# Patient Record
Sex: Female | Born: 1935 | Race: White | Hispanic: No | Smoking: Former smoker
Health system: Southern US, Community
[De-identification: ages and names within clinical notes are randomized; demographics above are authoritative.]

## PROBLEM LIST (undated history)

## (undated) ENCOUNTER — Ambulatory Visit: Payer: MEDICARE | Attending: Hematology & Oncology | Primary: Hematology & Oncology

## (undated) ENCOUNTER — Telehealth

## (undated) ENCOUNTER — Encounter: Attending: Hematology & Oncology | Primary: Hematology & Oncology

## (undated) ENCOUNTER — Ambulatory Visit: Payer: MEDICARE

## (undated) ENCOUNTER — Encounter

## (undated) ENCOUNTER — Telehealth: Attending: Hematology & Oncology | Primary: Hematology & Oncology

## (undated) ENCOUNTER — Ambulatory Visit

## (undated) DIAGNOSIS — J449 Chronic obstructive pulmonary disease, unspecified: Secondary | ICD-10-CM

## (undated) DIAGNOSIS — IMO0002 Reserved for concepts with insufficient information to code with codable children: Secondary | ICD-10-CM

## (undated) DIAGNOSIS — K219 Gastro-esophageal reflux disease without esophagitis: Secondary | ICD-10-CM

## (undated) DIAGNOSIS — R9389 Abnormal findings on diagnostic imaging of other specified body structures: Secondary | ICD-10-CM

## (undated) DIAGNOSIS — H3530 Unspecified macular degeneration: Secondary | ICD-10-CM

## (undated) DIAGNOSIS — Z8489 Family history of other specified conditions: Secondary | ICD-10-CM

## (undated) DIAGNOSIS — E785 Hyperlipidemia, unspecified: Secondary | ICD-10-CM

## (undated) DIAGNOSIS — I509 Heart failure, unspecified: Secondary | ICD-10-CM

## (undated) DIAGNOSIS — I4891 Unspecified atrial fibrillation: Secondary | ICD-10-CM

## (undated) DIAGNOSIS — I10 Essential (primary) hypertension: Secondary | ICD-10-CM

## (undated) DIAGNOSIS — D3A098 Benign carcinoid tumors of other sites: Secondary | ICD-10-CM

## (undated) DIAGNOSIS — IMO0001 Reserved for inherently not codable concepts without codable children: Secondary | ICD-10-CM

## (undated) DIAGNOSIS — Z828 Family history of other disabilities and chronic diseases leading to disablement, not elsewhere classified: Secondary | ICD-10-CM

## (undated) DIAGNOSIS — C801 Malignant (primary) neoplasm, unspecified: Secondary | ICD-10-CM

## (undated) DIAGNOSIS — D751 Secondary polycythemia: Secondary | ICD-10-CM

## (undated) HISTORY — DX: Secondary polycythemia: D75.1

## (undated) HISTORY — PX: SPLENECTOMY: SUR1306

## (undated) HISTORY — PX: COLON RESECTION: SHX5231

## (undated) HISTORY — PX: COLON SURGERY: SHX602

## (undated) HISTORY — DX: Benign carcinoid tumors of other sites: D3A.098

## (undated) HISTORY — DX: Hyperlipidemia, unspecified: E78.5

## (undated) HISTORY — PX: GASTRECTOMY: SHX58

## (undated) HISTORY — DX: Unspecified macular degeneration: H35.30

## (undated) HISTORY — DX: Unspecified atrial fibrillation: I48.91

## (undated) HISTORY — PX: CATARACT EXTRACTION: SUR2

## (undated) HISTORY — DX: Gastro-esophageal reflux disease without esophagitis: K21.9

## (undated) HISTORY — DX: Abnormal findings on diagnostic imaging of other specified body structures: R93.89

## (undated) HISTORY — DX: Essential (primary) hypertension: I10

## (undated) HISTORY — DX: Malignant (primary) neoplasm, unspecified: C80.1

---

## 2003-12-07 ENCOUNTER — Other Ambulatory Visit: Payer: Self-pay

## 2005-03-15 ENCOUNTER — Ambulatory Visit: Payer: Self-pay | Admitting: Internal Medicine

## 2005-06-27 ENCOUNTER — Ambulatory Visit: Payer: Self-pay | Admitting: Internal Medicine

## 2005-07-04 ENCOUNTER — Ambulatory Visit: Payer: Self-pay | Admitting: Internal Medicine

## 2006-06-21 ENCOUNTER — Ambulatory Visit: Payer: Self-pay | Admitting: Internal Medicine

## 2007-09-23 ENCOUNTER — Ambulatory Visit: Payer: Self-pay | Admitting: Internal Medicine

## 2010-03-15 ENCOUNTER — Ambulatory Visit: Payer: Self-pay | Admitting: Internal Medicine

## 2011-08-11 ENCOUNTER — Ambulatory Visit: Payer: Self-pay | Admitting: Oncology

## 2011-08-29 ENCOUNTER — Ambulatory Visit: Payer: Self-pay | Admitting: Oncology

## 2011-09-08 ENCOUNTER — Ambulatory Visit: Payer: Self-pay | Admitting: Oncology

## 2011-10-09 ENCOUNTER — Ambulatory Visit: Payer: Self-pay | Admitting: Oncology

## 2011-10-18 ENCOUNTER — Ambulatory Visit: Payer: Self-pay | Admitting: Internal Medicine

## 2011-10-25 ENCOUNTER — Ambulatory Visit: Payer: Self-pay | Admitting: Oncology

## 2011-10-30 ENCOUNTER — Ambulatory Visit: Payer: Self-pay | Admitting: Internal Medicine

## 2011-11-08 ENCOUNTER — Ambulatory Visit: Payer: Self-pay | Admitting: Oncology

## 2011-12-09 ENCOUNTER — Ambulatory Visit: Payer: Self-pay | Admitting: Oncology

## 2011-12-25 ENCOUNTER — Ambulatory Visit: Payer: Self-pay

## 2012-01-08 ENCOUNTER — Ambulatory Visit: Payer: Self-pay | Admitting: Oncology

## 2012-02-08 ENCOUNTER — Ambulatory Visit: Payer: Self-pay | Admitting: Oncology

## 2012-03-10 ENCOUNTER — Ambulatory Visit: Payer: Self-pay | Admitting: Oncology

## 2012-04-09 ENCOUNTER — Ambulatory Visit: Payer: Self-pay | Admitting: Oncology

## 2012-04-09 ENCOUNTER — Ambulatory Visit: Payer: Self-pay

## 2012-05-10 ENCOUNTER — Ambulatory Visit: Payer: Self-pay | Admitting: Oncology

## 2012-05-10 ENCOUNTER — Ambulatory Visit: Payer: Self-pay

## 2012-06-09 ENCOUNTER — Ambulatory Visit: Payer: Self-pay | Admitting: Oncology

## 2012-07-10 ENCOUNTER — Ambulatory Visit: Payer: Self-pay | Admitting: Oncology

## 2012-08-10 ENCOUNTER — Ambulatory Visit: Payer: Self-pay | Admitting: Oncology

## 2012-09-07 ENCOUNTER — Ambulatory Visit: Payer: Self-pay | Admitting: Oncology

## 2012-10-08 ENCOUNTER — Ambulatory Visit: Payer: Self-pay | Admitting: Oncology

## 2012-10-18 ENCOUNTER — Ambulatory Visit: Payer: Self-pay | Admitting: Internal Medicine

## 2012-11-07 ENCOUNTER — Ambulatory Visit: Payer: Self-pay | Admitting: Oncology

## 2012-12-08 ENCOUNTER — Ambulatory Visit: Payer: Self-pay | Admitting: Oncology

## 2013-01-07 ENCOUNTER — Ambulatory Visit: Payer: Self-pay | Admitting: Oncology

## 2013-02-07 ENCOUNTER — Ambulatory Visit: Payer: Self-pay | Admitting: Oncology

## 2013-03-10 ENCOUNTER — Ambulatory Visit: Payer: Self-pay | Admitting: Oncology

## 2013-04-09 ENCOUNTER — Ambulatory Visit: Payer: Self-pay | Admitting: Oncology

## 2013-05-10 ENCOUNTER — Ambulatory Visit: Payer: Self-pay | Admitting: Oncology

## 2013-06-09 ENCOUNTER — Ambulatory Visit: Payer: Self-pay | Admitting: Oncology

## 2013-06-15 ENCOUNTER — Ambulatory Visit: Payer: Self-pay | Admitting: Physician Assistant

## 2013-06-20 ENCOUNTER — Ambulatory Visit: Payer: Self-pay | Admitting: Physician Assistant

## 2013-06-20 LAB — COMPREHENSIVE METABOLIC PANEL
Albumin: 3.4 g/dL (ref 3.4–5.0)
Alkaline Phosphatase: 106 U/L
Anion Gap: 8 (ref 7–16)
BUN: 13 mg/dL (ref 7–18)
Bilirubin,Total: 0.3 mg/dL (ref 0.2–1.0)
Calcium, Total: 9.6 mg/dL (ref 8.5–10.1)
Chloride: 101 mmol/L (ref 98–107)
EGFR (African American): >60
EGFR (Non-African Amer.): >60
Potassium: 3.1 mmol/L — ABNORMAL LOW (ref 3.5–5.1)
SGOT(AST): 29 U/L (ref 15–37)
SGPT (ALT): 27 U/L (ref 12–78)
Sodium: 140 mmol/L (ref 136–145)
Total Protein: 7.2 g/dL (ref 6.4–8.2)

## 2013-06-20 LAB — URINALYSIS, COMPLETE
Bilirubin,UR: NEGATIVE
Glucose,UR: NEGATIVE mg/dL (ref 0–75)
Ketone: NEGATIVE
Ph: 6.5 (ref 4.5–8.0)
Specific Gravity: 1.01 (ref 1.003–1.030)

## 2013-06-20 LAB — CBC WITH DIFFERENTIAL/PLATELET
Eosinophil %: 0.1 %
Lymphocyte %: 25.7 %
MCH: 27.7 pg (ref 26.0–34.0)
MCHC: 33.6 g/dL (ref 32.0–36.0)
Neutrophil #: 7 10*3/uL — ABNORMAL HIGH (ref 1.4–6.5)
Neutrophil %: 65.1 %
WBC: 10.8 10*3/uL (ref 3.6–11.0)

## 2013-06-22 LAB — URINE CULTURE

## 2013-07-10 ENCOUNTER — Ambulatory Visit: Payer: Self-pay

## 2013-07-10 ENCOUNTER — Ambulatory Visit: Payer: Self-pay | Admitting: Oncology

## 2013-08-10 ENCOUNTER — Ambulatory Visit: Payer: Self-pay | Admitting: Oncology

## 2013-08-17 ENCOUNTER — Ambulatory Visit: Payer: Self-pay | Admitting: Physician Assistant

## 2013-09-10 ENCOUNTER — Ambulatory Visit: Payer: Self-pay | Admitting: Oncology

## 2013-10-08 ENCOUNTER — Ambulatory Visit: Payer: Self-pay | Admitting: Oncology

## 2013-10-30 ENCOUNTER — Ambulatory Visit: Payer: Self-pay | Admitting: Internal Medicine

## 2013-11-07 ENCOUNTER — Inpatient Hospital Stay: Payer: Self-pay | Admitting: Internal Medicine

## 2013-11-07 ENCOUNTER — Ambulatory Visit: Payer: Self-pay | Admitting: Oncology

## 2013-11-07 ENCOUNTER — Ambulatory Visit: Payer: Self-pay | Admitting: Family Medicine

## 2013-11-07 LAB — COMPREHENSIVE METABOLIC PANEL
ALK PHOS: 116 U/L
ALT: 37 U/L (ref 12–78)
Albumin: 2.9 g/dL — ABNORMAL LOW (ref 3.4–5.0)
Anion Gap: 7 (ref 7–16)
BUN: 21 mg/dL — AB (ref 7–18)
Bilirubin,Total: 0.3 mg/dL (ref 0.2–1.0)
CHLORIDE: 110 mmol/L — AB (ref 98–107)
CO2: 27 mmol/L (ref 21–32)
CREATININE: 0.74 mg/dL (ref 0.60–1.30)
Calcium, Total: 8.3 mg/dL — ABNORMAL LOW (ref 8.5–10.1)
EGFR (African American): >60
EGFR (Non-African Amer.): >60
GLUCOSE: 96 mg/dL (ref 65–99)
Osmolality: 290 (ref 275–301)
Potassium: 3.8 mmol/L (ref 3.5–5.1)
SGOT(AST): 50 U/L — ABNORMAL HIGH (ref 15–37)
SODIUM: 144 mmol/L (ref 136–145)
Total Protein: 6.5 g/dL (ref 6.4–8.2)

## 2013-11-07 LAB — CBC
HCT: 43.6 % (ref 35.0–47.0)
HGB: 14.1 g/dL (ref 12.0–16.0)
MCH: 27 pg (ref 26.0–34.0)
MCHC: 32.3 g/dL (ref 32.0–36.0)
MCV: 84 fL (ref 80–100)
Platelet: 251 10*3/uL (ref 150–440)
RBC: 5.22 10*6/uL — AB (ref 3.80–5.20)
RDW: 14.1 % (ref 11.5–14.5)
WBC: 14 10*3/uL — ABNORMAL HIGH (ref 3.6–11.0)

## 2013-11-07 LAB — CK-MB
CK-MB: 2.8 ng/mL (ref 0.5–3.6)
CK-MB: 4.1 ng/mL — ABNORMAL HIGH (ref 0.5–3.6)

## 2013-11-07 LAB — APTT: Activated PTT: 26.9 secs (ref 23.6–35.9)

## 2013-11-07 LAB — TROPONIN I
TROPONIN-I: 0.48 ng/mL — AB
Troponin-I: 0.18 ng/mL — ABNORMAL HIGH

## 2013-11-07 LAB — PRO B NATRIURETIC PEPTIDE: B-TYPE NATIURETIC PEPTID: 18258 pg/mL — AB (ref 0–450)

## 2013-11-07 LAB — CK: CK, Total: 66 U/L

## 2013-11-08 LAB — COMPREHENSIVE METABOLIC PANEL
ALK PHOS: 111 U/L
ANION GAP: 7 (ref 7–16)
Albumin: 2.9 g/dL — ABNORMAL LOW (ref 3.4–5.0)
BUN: 24 mg/dL — ABNORMAL HIGH (ref 7–18)
Bilirubin,Total: 0.5 mg/dL (ref 0.2–1.0)
CALCIUM: 8.4 mg/dL — AB (ref 8.5–10.1)
Chloride: 108 mmol/L — ABNORMAL HIGH (ref 98–107)
Co2: 31 mmol/L (ref 21–32)
Creatinine: 0.78 mg/dL (ref 0.60–1.30)
EGFR (African American): >60
EGFR (Non-African Amer.): >60
GLUCOSE: 101 mg/dL — AB (ref 65–99)
Osmolality: 295 (ref 275–301)
Potassium: 3.9 mmol/L (ref 3.5–5.1)
SGOT(AST): 39 U/L — ABNORMAL HIGH (ref 15–37)
SGPT (ALT): 32 U/L (ref 12–78)
SODIUM: 146 mmol/L — AB (ref 136–145)
Total Protein: 6 g/dL — ABNORMAL LOW (ref 6.4–8.2)

## 2013-11-08 LAB — APTT
Activated PTT: 64.2 secs — ABNORMAL HIGH (ref 23.6–35.9)
Activated PTT: 71.2 secs — ABNORMAL HIGH (ref 23.6–35.9)

## 2013-11-08 LAB — LIPID PANEL
Cholesterol: 136 mg/dL (ref 0–200)
HDL Cholesterol: 51 mg/dL (ref 40–60)
LDL CHOLESTEROL, CALC: 68 mg/dL (ref 0–100)
Triglycerides: 84 mg/dL (ref 0–200)
VLDL Cholesterol, Calc: 17 mg/dL (ref 5–40)

## 2013-11-08 LAB — HEMOGLOBIN A1C: Hemoglobin A1C: 6 % (ref 4.2–6.3)

## 2013-11-08 LAB — CK
CK, TOTAL: 61 U/L
CK, Total: 49 U/L

## 2013-11-08 LAB — PRO B NATRIURETIC PEPTIDE: B-Type Natriuretic Peptide: 14324 pg/mL — ABNORMAL HIGH (ref 0–450)

## 2013-11-08 LAB — TSH: THYROID STIMULATING HORM: 1.4 u[IU]/mL

## 2013-11-08 LAB — TROPONIN I: TROPONIN-I: 0.54 ng/mL — AB

## 2013-11-09 LAB — SEDIMENTATION RATE: Erythrocyte Sed Rate: 6 mm/hr (ref 0–30)

## 2013-11-10 LAB — CBC WITH DIFFERENTIAL/PLATELET
Basophil #: 0 10*3/uL (ref 0.0–0.1)
Basophil %: 0.1 %
EOS PCT: 0 %
Eosinophil #: 0 10*3/uL (ref 0.0–0.7)
HCT: 38.3 % (ref 35.0–47.0)
HGB: 12 g/dL (ref 12.0–16.0)
Lymphocyte #: 1.4 10*3/uL (ref 1.0–3.6)
Lymphocyte %: 5.6 %
MCH: 26.2 pg (ref 26.0–34.0)
MCHC: 31.5 g/dL — AB (ref 32.0–36.0)
MCV: 83 fL (ref 80–100)
MONO ABS: 0.6 x10 3/mm (ref 0.2–0.9)
Monocyte %: 2.5 %
Neutrophil #: 22.4 10*3/uL — ABNORMAL HIGH (ref 1.4–6.5)
Neutrophil %: 91.8 %
PLATELETS: 245 10*3/uL (ref 150–440)
RBC: 4.6 10*6/uL (ref 3.80–5.20)
RDW: 14.2 % (ref 11.5–14.5)
WBC: 24.4 10*3/uL — AB (ref 3.6–11.0)

## 2013-11-10 LAB — TSH: Thyroid Stimulating Horm: 0.297 u[IU]/mL — ABNORMAL LOW

## 2013-11-11 LAB — BASIC METABOLIC PANEL
Anion Gap: 2 — ABNORMAL LOW (ref 7–16)
BUN: 38 mg/dL — AB (ref 7–18)
Calcium, Total: 8.6 mg/dL (ref 8.5–10.1)
Chloride: 107 mmol/L (ref 98–107)
Co2: 32 mmol/L (ref 21–32)
Creatinine: 1.12 mg/dL (ref 0.60–1.30)
EGFR (African American): 56 — ABNORMAL LOW
GFR CALC NON AF AMER: 48 — AB
Glucose: 97 mg/dL (ref 65–99)
Osmolality: 290 (ref 275–301)
Potassium: 3.8 mmol/L (ref 3.5–5.1)
SODIUM: 141 mmol/L (ref 136–145)

## 2013-11-11 LAB — CBC WITH DIFFERENTIAL/PLATELET
BASOS ABS: 0 10*3/uL (ref 0.0–0.1)
Basophil %: 0.1 %
Eosinophil #: 0 10*3/uL (ref 0.0–0.7)
Eosinophil %: 0.1 %
HCT: 40.5 % (ref 35.0–47.0)
HGB: 13.1 g/dL (ref 12.0–16.0)
LYMPHS PCT: 9.8 %
Lymphocyte #: 2.1 10*3/uL (ref 1.0–3.6)
MCH: 26.9 pg (ref 26.0–34.0)
MCHC: 32.3 g/dL (ref 32.0–36.0)
MCV: 83 fL (ref 80–100)
Monocyte #: 1.7 x10 3/mm — ABNORMAL HIGH (ref 0.2–0.9)
Monocyte %: 8 %
NEUTROS ABS: 18 10*3/uL — AB (ref 1.4–6.5)
Neutrophil %: 82 %
Platelet: 226 10*3/uL (ref 150–440)
RBC: 4.87 10*6/uL (ref 3.80–5.20)
RDW: 14 % (ref 11.5–14.5)
WBC: 21.9 10*3/uL — AB (ref 3.6–11.0)

## 2013-11-12 LAB — BASIC METABOLIC PANEL
Anion Gap: 3 — ABNORMAL LOW (ref 7–16)
BUN: 29 mg/dL — AB (ref 7–18)
CO2: 33 mmol/L — AB (ref 21–32)
Calcium, Total: 8.6 mg/dL (ref 8.5–10.1)
Chloride: 106 mmol/L (ref 98–107)
Creatinine: 1.11 mg/dL (ref 0.60–1.30)
EGFR (African American): 56 — ABNORMAL LOW
EGFR (Non-African Amer.): 49 — ABNORMAL LOW
GLUCOSE: 107 mg/dL — AB (ref 65–99)
OSMOLALITY: 289 (ref 275–301)
Potassium: 3.9 mmol/L (ref 3.5–5.1)
Sodium: 142 mmol/L (ref 136–145)

## 2013-11-12 LAB — CULTURE, BLOOD (SINGLE)

## 2013-11-12 LAB — WBC: WBC: 15.2 10*3/uL — AB (ref 3.6–11.0)

## 2013-11-18 ENCOUNTER — Ambulatory Visit: Payer: Self-pay | Admitting: Family

## 2013-12-03 ENCOUNTER — Ambulatory Visit: Payer: Self-pay | Admitting: Oncology

## 2013-12-08 ENCOUNTER — Ambulatory Visit: Payer: Self-pay | Admitting: Oncology

## 2013-12-24 ENCOUNTER — Ambulatory Visit: Payer: Self-pay | Admitting: Family

## 2014-01-07 ENCOUNTER — Ambulatory Visit: Payer: Self-pay | Admitting: Oncology

## 2014-01-26 ENCOUNTER — Ambulatory Visit: Payer: Self-pay | Admitting: Family

## 2014-02-07 ENCOUNTER — Ambulatory Visit: Payer: Self-pay | Admitting: Oncology

## 2014-03-10 ENCOUNTER — Ambulatory Visit: Payer: Self-pay | Admitting: Oncology

## 2014-04-09 ENCOUNTER — Ambulatory Visit: Payer: Self-pay | Admitting: Oncology

## 2014-05-10 ENCOUNTER — Ambulatory Visit: Payer: Self-pay | Admitting: Oncology

## 2014-06-03 ENCOUNTER — Ambulatory Visit: Payer: Self-pay | Admitting: Oncology

## 2014-06-08 ENCOUNTER — Ambulatory Visit: Payer: Self-pay | Admitting: Family

## 2014-06-09 ENCOUNTER — Ambulatory Visit: Payer: Self-pay | Admitting: Oncology

## 2014-06-12 ENCOUNTER — Emergency Department: Payer: Self-pay | Admitting: Emergency Medicine

## 2014-06-12 LAB — URINALYSIS, COMPLETE
BACTERIA: NONE SEEN
BILIRUBIN, UR: NEGATIVE
Blood: NEGATIVE
Glucose,UR: NEGATIVE mg/dL (ref 0–75)
KETONE: NEGATIVE
LEUKOCYTE ESTERASE: NEGATIVE
Nitrite: NEGATIVE
PH: 6 (ref 4.5–8.0)
Protein: NEGATIVE
RBC,UR: 3 /HPF (ref 0–5)
Specific Gravity: 1.013 (ref 1.003–1.030)
Squamous Epithelial: <1
WBC UR: NONE SEEN /HPF (ref 0–5)

## 2014-06-12 LAB — BASIC METABOLIC PANEL
Anion Gap: 5 — ABNORMAL LOW (ref 7–16)
BUN: 15 mg/dL (ref 7–18)
CHLORIDE: 109 mmol/L — AB (ref 98–107)
Calcium, Total: 9.1 mg/dL (ref 8.5–10.1)
Co2: 28 mmol/L (ref 21–32)
Creatinine: 0.86 mg/dL (ref 0.60–1.30)
EGFR (Non-African Amer.): >60
Glucose: 90 mg/dL (ref 65–99)
Osmolality: 283 (ref 275–301)
Potassium: 4.1 mmol/L (ref 3.5–5.1)
SODIUM: 142 mmol/L (ref 136–145)

## 2014-06-12 LAB — CBC
HCT: 49.2 % — ABNORMAL HIGH (ref 35.0–47.0)
HGB: 15.9 g/dL (ref 12.0–16.0)
MCH: 28.4 pg (ref 26.0–34.0)
MCHC: 32.4 g/dL (ref 32.0–36.0)
MCV: 88 fL (ref 80–100)
Platelet: 208 10*3/uL (ref 150–440)
RBC: 5.61 10*6/uL — ABNORMAL HIGH (ref 3.80–5.20)
RDW: 14.5 % (ref 11.5–14.5)
WBC: 8.5 10*3/uL (ref 3.6–11.0)

## 2014-06-12 LAB — HEPATIC FUNCTION PANEL A (ARMC)
ALBUMIN: 3.6 g/dL (ref 3.4–5.0)
Alkaline Phosphatase: 101 U/L
Bilirubin,Total: 0.4 mg/dL (ref 0.2–1.0)
SGOT(AST): 32 U/L (ref 15–37)
SGPT (ALT): 23 U/L
TOTAL PROTEIN: 7.5 g/dL (ref 6.4–8.2)

## 2014-06-12 LAB — TROPONIN I: Troponin-I: <0.02 ng/mL

## 2014-06-12 LAB — LIPASE, BLOOD: Lipase: 147 U/L (ref 73–393)

## 2014-06-13 ENCOUNTER — Ambulatory Visit: Payer: Self-pay | Admitting: Oncology

## 2014-06-26 ENCOUNTER — Ambulatory Visit: Payer: Self-pay | Admitting: Family

## 2014-07-10 ENCOUNTER — Ambulatory Visit: Payer: Self-pay | Admitting: Oncology

## 2014-07-29 ENCOUNTER — Ambulatory Visit: Payer: Self-pay | Admitting: Ophthalmology

## 2014-08-26 ENCOUNTER — Ambulatory Visit: Payer: Self-pay | Admitting: Oncology

## 2014-09-01 ENCOUNTER — Ambulatory Visit: Payer: Self-pay | Admitting: Oncology

## 2014-09-08 ENCOUNTER — Ambulatory Visit
Admit: 2014-09-08 | Disposition: A | Discharge status: Home / Self Care | Payer: Self-pay | Attending: Oncology | Admitting: Oncology

## 2014-10-09 ENCOUNTER — Ambulatory Visit
Admit: 2014-10-09 | Disposition: A | Discharge status: Home / Self Care | Payer: Self-pay | Attending: Oncology | Admitting: Oncology

## 2014-10-31 NOTE — Consult Note (Signed)
PATIENT NAME:  Veronica Wang, Veronica Wang MR#:  [id number] DATE OF BIRTH:  1936-04-22  CARDIOLOGY CONSULTATION   DATE OF CONSULTATION:  11/08/2013  CONSULTING PHYSICIAN:  Isaias Cowman, MD  PRIMARY CARE PHYSICIAN: Adin Hector, MD   CHIEF COMPLAINT: Shortness of breath.   REASON FOR CONSULTATION: Consultation requested for evaluation of elevated troponin.   HISTORY OF PRESENT ILLNESS: The patient is a 79 year old female with history of carcinoid. The patient has been in her usual state of health until 2 weeks ago, when she has been experiencing increasing exertional dyspnea. The patient was seen as an outpatient and treated with prednisone pack and antibiotics, with presumptive diagnosis of bronchitis. The patient continued to experience shortness of breath and presented to [facility], where she was tachycardic, tachypneic and hypoxic, with an O2 saturation of 86%. EKG revealed atrial fibrillation with rapid ventricular rate which was brief in nature and converted to sinus rhythm. The patient had borderline elevated troponin of 0.18. EKG was nondiagnostic. The patient was admitted to telemetry. Followup troponin was 0.48. Total CPK, MB were 66 and 4.1, respectively. The patient denies chest pain. The patient does note that she has had some mild pedal edema during the past 2 weeks.   PAST MEDICAL HISTORY:  1. Carcinoid.  2. History of tobacco abuse.   MEDICATIONS:  1. Prednisone taper.  2. Cefuroxime 250 mg b.i.d. 3. Gabapentin 1200 mg t.i.d. 4. Multivitamin 1 daily.   SOCIAL HISTORY: The patient currently lives alone. She occasionally smokes.   FAMILY HISTORY: No immediate family history of myocardial infarction.   REVIEW OF SYSTEMS:  CONSTITUTIONAL: The patient denies fever or chills.  EYES: No blurry vision.  EARS: No hearing loss.  RESPIRATORY: The patient has had progressive exertional dyspnea.  CARDIOVASCULAR: The patient denies chest pain.  GASTROINTESTINAL: The patient  has diarrhea related to her carcinoid.  GENITOURINARY: No dysuria or hematuria.  ENDOCRINE: No polyuria or polydipsia.  MUSCULOSKELETAL: No arthralgias or myalgias.  NEUROLOGICAL: No focal muscle weakness or numbness.  PSYCHOLOGICAL: No depression or anxiety.   PHYSICAL EXAMINATION:  VITAL SIGNS: Blood pressure 115/75, pulse 72, respirations 16, temperature 97.6, pulse oximetry 97%.  HEENT: Pupils equally reactive to light and accommodation.  NECK: Supple without thyromegaly.  LUNGS: Reveal decreased breath sounds in both bases.  CARDIOVASCULAR: Normal JVP. Normal PMI. Regular rate and rhythm. No appreciable gallop, murmur or rub.  ABDOMEN: Soft and nontender. Pulses were intact bilaterally.  MUSCULOSKELETAL: Normal muscle tone.  NEUROLOGICAL: The patient is alert and oriented x3. Motor and sensory both grossly intact.   IMPRESSION: A 79 year old female who presents with a 2-week history of exertional dyspnea, mild pedal edema, occasional productive cough, consistent with picture of bronchitis, with a history of carcinoid. The patient has borderline elevated troponin with completely normal CPK-MB, nondiagnostic EKG in the absence of chest pain. I suspect the borderline elevated troponin is due to demand-supply ischemia and not due to acute coronary syndrome. The patient did have a brief episode of atrial fibrillation, currently in sinus rhythm.   RECOMMENDATIONS:  1. Agree with overall current therapy.  2. Would discontinue heparin drip. At this time, risks appear to outweigh advantages.  3. Review 2-D echocardiogram.  4. Further recommendations based on echocardiogram results.   ____________________________ Isaias Cowman, MD ap:lb D: 11/08/2013 09:42:17 ET T: 11/08/2013 10:06:36 ET JOB#: [id number]  cc: Isaias Cowman, MD, <Dictator> Isaias Cowman MD ELECTRONICALLY SIGNED 12/08/2013 15:02

## 2014-10-31 NOTE — H&P (Signed)
PATIENT NAME:  Veronica Wang, Veronica Wang MR#:  [id number] DATE OF BIRTH:  [birth year]  DATE OF ADMISSION:  11/07/2013  PRIMARY CARE PHYSICIAN: Dr. Ramonita Lab at [facility].   REFERRING PHYSICIAN: Dr. Corky Downs.   CHIEF COMPLAINT: Shortness of breath.   HISTORY OF PRESENT ILLNESS: The patient is a pleasant 79 year old thin female who is here for above chief complaint. Of note, the patient has been having shortness of breath and fast heart rate for a couple of weeks, went to doctor at [facility] who started her on cefuroxime. Of note, there is also a CT angio of the chest for PE protocol which was ordered around the same time, which shows no PE but advanced interstitial pulmonary fibrosis. The patient has been not improving significantly and had gone to an urgent care center after shortness of breath worsened yesterday. While at urgent care center, which we have access to, she appeared to be significantly tachycardic, tachypneic and hypoxic with O2 sat of 86% on room air and she is not on oxygen at baseline. Furthermore, EKGs had shown afib with RVR which is newly diagnosed with some ST changes and the patient was referred here. However, the patient was noted to have converted to sinus rhythm while there and the heart rate went to 85 with blood pressure of 113/77, and the patient became more comfortable. She was referred here.  And here, on EKG she appears to be in sinus but there are some T-wave inversions and inferior lateral leads and some ST changes, namely depression in V5 mostly. She had experienced some chest pains with pressure-like feeling in all of her chest and going to her arms today. She is also noted to have a positive troponin and hospitalist services were contacted for further evaluation and management.   PAST MEDICAL HISTORY: History of carcinoid tumor who is being taken care of at [facility] and has had carcinoid surgery in the past, history of shingles and postherpetic neuralgia, history of  radiation therapy, history of long tobacco abuse, now off and on, indigestion, diarrhea with her Sandostatin injections q. monthly for her carcinoid.   ALLERGIES: No known drug allergies.   OUTPATIENT MEDICATIONS: She just finished a prednisone taper, cefuroxime 250 mg 2 times a day, gabapentin 600 mg 2 tabs 3 times a day, multivitamin 1 tab daily and Sandostatin injection monthly.   SOCIAL HISTORY: A long-time smoker had quit, but now smokes off and on if she can but does not smoke much. No alcohol. No drugs.   FAMILY HISTORY: Heart and stroke runs in the family.  REVIEW OF SYSTEMS:  CONSTITUTIONAL: Denies any significant weight changes. No fevers or chills, but some weakness.  EYES: Macular degeneration and has injection in her eyes. EARS, NOSE, THROAT: No tinnitus but does have some dysphagia where she feels like sometimes she cannot swallow.  RESPIRATORY: Positive for shortness of breath, dyspnea on exertion. No painful respirations.  CARDIOVASCULAR: Chest pain as above and has had some orthopnea recently, as well as some swelling in the legs, which is better today. Palpitations off and on for a couple of weeks.  GASTROINTESTINAL: History of carcinoid abdominal tumor but no abdominal pain, nausea, vomiting, diarrhea, bloody or dark stools.  GENITOURINARY: Denies dysuria, hematuria. HEMATOLOGIC AND LYMPHATIC: Denies anemia or easy bruising.  SKIN: No new rashes.  MUSCULOSKELETAL: Has some chronic arthritis.  NEUROLOGIC: No focal weakness or numbness.  PSYCHIATRIC: Denies depression or insomnia.   PHYSICAL EXAMINATION: VITAL SIGNS: Temperature here was 98.3, pulse  rate 115, respiratory 24, blood pressure 125/84, O2 sat 94% on oxygen here.  GENERAL: The patient is a very thin female sitting in bed in mild respiratory distress, talking in full sentences.  HEENT: Normocephalic, atraumatic. Pupils are equal and reactive. Temporal wasting.  NECK: Supple. No thyroid tenderness. No cervical  lymphadenopathy. No particular JVD.  CARDIOVASCULAR: S1, S2 regular. No significant murmurs.  LUNGS: Minimum basilar crackles.  Good air entry otherwise.  ABDOMEN: Soft, nontender, nondistended. Positive bowel sounds in all quadrants.  EXTREMITIES: Minimum pitting edema in the feet.  NEUROLOGIC: Cranial nerves II through XII grossly intact. Strength is 5/5 in all extremities. Sensation intact to light touch.  PSYCHIATRIC: Awake, alert and oriented x 3.  SKIN: No obvious rashes or lesions.  LABORATORIES AND IMAGING:  White count of 14, hemoglobin 14.1, platelets are 251. Troponin 0.18, albumin 2.9, bilirubin 0.3, AST 50, ALT 37. BNP is 18,258. BUN 21, creatinine 0.74. Sodium 144, potassium 3.8.  X-ray here shows sinus rhythm with some PACs, inferolateral T-waves and some ST depressions in V5. No acute ST elevations. Prolonged QT.  X-ray of the chest:  Emphysema with small bilateral effusions.   ASSESSMENT AND PLAN: We have a 79 year old female, long-time smoker, history of carcinoid, who has been recently been taken care of as an outpatient with cefuroxime and steroids for acute bronchitis, who also appears to have severe underlying pulmonary fibrosis based on the CAT scan done recently, who comes in for progressive shortness of breath in the last day or 2 with palpitations and noted to be in atrial fibrillation with rapid ventricular response with acute respiratory failure at urgent care. At this point, she is on oxygen. However, she is back in sinus.   In regards to the shortness of breath, I suspect this is triggered by paroxysmal atrial fibrillation with rapid ventricular response with underlying severe pulmonary fibrosis and probable acute congestive heart failure, unknown type. She has no history of congestive heart failure, but she had elevated BNP and some pitting edema, as well as x-ray suggesting some effusions. At this point, she is in sinus and rate controlled. Given the chest pain  issues, I also suspect she might have had non-ST elevation myocardial infarction with a positive troponin.   In regards to her shortness of breath, we would continue the oxygen.  Start her on some Lasix, nitropaste and morphine as needed. Start her on aspirin, a beta blocker to help maintain sinus rhythm and heparin drip. Would consult cardiology, obtain an echocardiogram to assess for left ventricle function and monitor her ins and outs. I would admit her to telemetry.  In regards to the positive troponin, I suspect it is more likely non-ST elevation myocardial infarction given the EKG changes with chest complaints. We will see what the troponins are like and follow with cardiology. She would be started on aspirin, statin, beta blocker, nitroglycerin patch and morphine as needed.  She has no significant chest pain now. I suspect that her progressive lung disease is also contributing, to what extent is not clear.  But I would obtain a pulmonary consult for further evaluation, and if this is advanced pulmonary fibrosis, the overall prognosis is not very good. We would continue her gabapentin and follow her clinically. The patient does appear to have systemic inflammatory response syndrome criteria on admission with leukocytosis and tachycardia and tachypnea. However, the leukocytosis, I suspect is secondary to prednisone. She has no significant fevers here or no productive cough to suggest she is having  persistent bronchitis. And I suspect her shortness of breath is more likely congestive heart failure and atrial fibrillation on top of pulmonary fibrosis related.  The patient is a DNR per her wishes.   ____________________________ Vivien Presto, MD sa:ce D: 11/07/2013 19:20:37 ET T: 11/07/2013 19:59:27 ET JOB#: [id number]  cc: Vivien Presto, MD, <Dictator> Adin Hector, MD Vivien Presto MD ELECTRONICALLY SIGNED 12/18/2013 10:45

## 2014-10-31 NOTE — Consult Note (Signed)
History of Present Illness:  Reason for Consult metastatic carcinoid, now with acute CHF to   HPI   Patient seen in clinic in March of 2015. Veronica Wang receives monthly octreotide injections in followup every 6 months. All of her imaging at this point has been completed a [facility], but Veronica Wang has decided to completely switch her entire care to [place]. Currently, Veronica Wang still feels short of breath but significantly improved from admission. Veronica Wang otherwise feels well. Veronica Wang has no neurologic complaints.  Veronica Wang denies any recent fevers or illnesses.  Veronica Wang has a good appetite and her weight has remained stable.  He denies any chest pain or shortness of breath.  Veronica Wang has no bone pain and denies any nausea, vomiting, or constipation.  Veronica Wang has no urinary complaints.  Patient offers no further specific complaints today.  PFSH:  Additional Past Medical and Surgical History Metastatic carcinoid. Partial colon resection.  Social history: Positive tobacco, greater than 50 pack years.  Patient continues to smoke.  Patient was offered smoking cessation counseling.  Denies alcohol.  Family history: Negative and noncontributory.   Review of Systems:  Performance Status (ECOG) 1   Review of Systems   As per HPI. Otherwise, 10 point system review was negative.   NURSING NOTES: **Vital Signs.:   04-May-15 11:49   Vital Signs Type: Routine   Temperature Temperature (F): 97.6   Celsius: 36.4   Pulse Pulse: 94   Respirations Respirations: 20   Systolic BP Systolic BP: 710   Diastolic BP (mmHg) Diastolic BP (mmHg): 56   Mean BP: 74   Pulse Ox % Pulse Ox %: 94   Pulse Ox Activity Level: At rest   Oxygen Delivery: 2L   Physical Exam:  Physical Exam General: Well-developed, well-nourished, no acute distress. Eyes: Pink conjunctiva, anicteric sclera. HEENT: Normocephalic, moist mucous membranes, clear oropharnyx. Lungs: scattered wheezing Heart: Regular rate and rhythm. No rubs, murmurs, or gallops. Abdomen:  Soft, nontender, nondistended. No organomegaly noted, normoactive bowel sounds. Musculoskeletal: No edema, cyanosis, or clubbing. Neuro: Alert, answering all questions appropriately. Cranial nerves grossly intact. Skin: No rashes or petechiae noted. Psych: Normal affect.    No Known Allergies:     cefuroxime 250 mg oral tablet: 1 tab(s) orally 2 times a day x 10 days, Status: Active, Quantity: 0, Refills: None   gabapentin 600 mg oral tablet: 2 tabs (1200mg ) orally 3 times a day, Status: Active, Quantity: 0, Refills: None   multivitamin: 1 tab(s) orally once a day, Status: Active, Quantity: 0, Refills: None   SandoSTATIN: 1 dose(s) intramuscular once a month, Status: Active, Quantity: 0, Refills: None  Laboratory Results: Thyroid:  04-May-15 04:28   Thyroid Stimulating Hormone  0.297 (0.45-4.50 (International Unit)  ----------------------- Pregnant patients have  different reference  ranges for TSH:  - - - - - - - - - -  Pregnant, first trimetser:  0.36 - 2.50 uIU/mL)  Routine Hem:  04-May-15 04:28   WBC (CBC)  24.4  RBC (CBC) 4.60  Hemoglobin (CBC) 12.0  Hematocrit (CBC) 38.3  Platelet Count (CBC) 245  MCV 83  MCH 26.2  MCHC  31.5  RDW 14.2  Neutrophil % 91.8  Lymphocyte % 5.6  Monocyte % 2.5  Eosinophil % 0.0  Basophil % 0.1  Neutrophil #  22.4  Lymphocyte # 1.4  Monocyte # 0.6  Eosinophil # 0.0  Basophil # 0.0 (Result(s) reported on 10 Nov 2013 at 05:36AM.)   Assessment and Plan: Impression:   Metastatic carcinoid. Plan:  1.  Metastatic carcinoid: Patient last MRI at [facility] was in April of 2015 that was reported as unchanged from prior.  Continue monthly octreotide injections. Patient has also been instructed to keep her next followup appointment in September 2015. At this point, patient wishes to have the remainder of her yearly MRIs completed at [facility]. In the next 1-2 weeks, will get imaging studies from [facility] uploaded to local radiology system.  Her next one is  due in April 2016. No further intervention is needed at this time.CHF: Treatment per pulmonology and cardiology. consult, call with questions.  Electronic Signatures: Delight Hoh (MD)  (Signed [phone] 17:40)  Authored: HISTORY OF PRESENT ILLNESS, PFSH, ROS, NURSING NOTES, PE, ALLERGIES, HOME MEDICATIONS, LABS, ASSESSMENT AND PLAN   Last Updated: 04-May-15 17:40 by Delight Hoh (MD)

## 2014-10-31 NOTE — Discharge Summary (Signed)
PATIENT NAME:  Veronica Wang, Veronica Wang MR#:  [id number] DATE OF BIRTH:  [birth year]  DATE OF ADMISSION:  11/07/2013 DATE OF DISCHARGE:  11/12/2013  FINAL DIAGNOSES: 1.  Acute on chronic left-sided systolic congestive heart failure.  2.  Atrial fibrillation with rapid ventricular rate.  3.  Pulmonary fibrosis.  4.  Metastatic carcinoid with liver metastasis.  5. Acute respiratory failure secondary to congestive heart failure.   HISTORY AND PHYSICAL: Please see dictated admission history and history.    [place] COURSE: The patient was admitted with acute respiratory failure, unable to maintain sats without supplemental oxygen. Films revealed a question of pulmonary fibrosis. Recent CT head showing this as well; however, examination was more consistent with congestive heart failure and pulmonary edema. An echocardiogram, which revealed markedly reduced LVEF, less than 20%, was noted to have atrial fibrillation on arrival as well. She responded very well to diuretics, although she continued to require oxygen. Her diuretics had to be reduced secondary to worsening renal function with diuresis. Cardiology saw the patient, as did pulmonology, and they agreed with the above diagnosis. Difficult to determine how acute the heart failure is, although she has an echocardiogram from 2012 which revealed normal LV function and it is thought that the symptoms were likely much more recent.   We discussed anticoagulation, given her CHADS2 score of 2; given her liver metastasis, it was thought that full anticoagulation would likely represent too much risk, and so she was maintained on aspirin. This was discussed with the patient, and she expresses understanding.   The patient ambulated with physical therapy and did well; did not require an assistive device. Her oxygen saturation fell to 88% on room air and then improved once the oxygen was replaced, improving to 94% on 2 liters, and so she will need home oxygen and  this was discussed with the patient as well. It was felt that she would benefit from home health nursing to monitor oxygenation and use of medications, as well as how she is doing in terms of diet and weighing herself and would also benefit from physical therapy to improve ambulation endurance. She is advised to weigh herself daily, calling for more than 2 pounds gain in 1 day or 5 pounds in 1 week or increasing signs and symptoms of heart failure, which were discussed with the patient. She will follow a two-gram sodium diet. She will follow up in our office within the next 1 to 2 weeks, follow up with Dr. Saralyn Pilar in the next 1 to 2 weeks. An appointment was made with heart failure clinic; however, if she is following with Dr. Saralyn Pilar, this may not be as necessary. The patient expressed wishes to have Dr. Grayland Ormond take over her carcinoid care, and he graciously agreed to do so and an appointment was made. He saw her in the hospital as well.   The patient is discharged now in stable condition.   HOME MEDICATIONS: Will include:  1.  Gabapentin 1200 mg p.o. t.i.d.  2.  Multivitamin 1 p.o. daily.  3.  Sandostatin 1 dose intramuscularly once a month.  4.  Prednisone taper starting at 20 mg daily, decreasing by 10 mg every 2 days.  5.  Lisinopril 5 mg p.o. daily.  6.  Aspirin 81 mg p.o. daily.  7.  Carvedilol 3.125 mg p.o. b.i.d.  8.  Combivent MDI 1 puff 4 times a day as needed for wheezing.  9.  Furosemide 20 mg p.o. every other day.  10.  Potassium 10 mEq p.o. every other day.   The patient was given instructions to stop Ceftin, as this was no longer required.    CODE STATUS: The patient is DO NOT RESUSCITATE at her request. Out of facility DNR order was sent with her to home.    ____________________________ Adin Hector, MD bjk:dmm D: 11/12/2013 07:56:15 ET T: 11/12/2013 10:26:45 ET JOB#: [id number]  cc: Tama High III, MD, <Dictator> Ramonita Lab MD ELECTRONICALLY SIGNED 11/12/2013  13:08

## 2014-11-10 DIAGNOSIS — I5022 Chronic systolic (congestive) heart failure: Secondary | ICD-10-CM

## 2014-11-18 ENCOUNTER — Other Ambulatory Visit: Payer: Self-pay | Admitting: Internal Medicine

## 2014-11-18 DIAGNOSIS — Z1239 Encounter for other screening for malignant neoplasm of breast: Secondary | ICD-10-CM

## 2014-11-20 ENCOUNTER — Inpatient Hospital Stay: Payer: Medicare Other | Attending: Oncology

## 2014-11-20 VITALS — BP 126/73 | HR 55 | Temp 96.0°F | Resp 20

## 2014-11-20 DIAGNOSIS — C7A019 Malignant carcinoid tumor of the small intestine, unspecified portion: Secondary | ICD-10-CM | POA: Insufficient documentation

## 2014-11-20 DIAGNOSIS — Z79899 Other long term (current) drug therapy: Secondary | ICD-10-CM | POA: Insufficient documentation

## 2014-11-20 DIAGNOSIS — C7B02 Secondary carcinoid tumors of liver: Secondary | ICD-10-CM | POA: Diagnosis not present

## 2014-11-20 DIAGNOSIS — D3A098 Benign carcinoid tumors of other sites: Secondary | ICD-10-CM | POA: Insufficient documentation

## 2014-11-20 MED ORDER — OCTREOTIDE ACETATE 20 MG IM KIT
20.0000 mg | PACK | Freq: Once | INTRAMUSCULAR | Status: AC
  Administered 2014-11-20: 20 mg via INTRAMUSCULAR

## 2014-11-24 ENCOUNTER — Ambulatory Visit
Admission: RE | Admit: 2014-11-24 | Discharge: 2014-11-24 | Disposition: A | Discharge status: Home / Self Care | Payer: Medicare Other | Source: Ambulatory Visit | Attending: Internal Medicine | Admitting: Internal Medicine

## 2014-11-24 DIAGNOSIS — Z1239 Encounter for other screening for malignant neoplasm of breast: Secondary | ICD-10-CM

## 2014-11-24 DIAGNOSIS — Z1231 Encounter for screening mammogram for malignant neoplasm of breast: Secondary | ICD-10-CM | POA: Diagnosis present

## 2014-11-24 HISTORY — DX: Family history of other disabilities and chronic diseases leading to disablement, not elsewhere classified: Z82.8

## 2014-11-24 HISTORY — DX: Family history of other specified conditions: Z84.89

## 2014-11-24 HISTORY — DX: Reserved for concepts with insufficient information to code with codable children: IMO0002

## 2014-11-24 HISTORY — DX: Reserved for inherently not codable concepts without codable children: IMO0001

## 2014-12-04 ENCOUNTER — Emergency Department: Payer: Medicare Other

## 2014-12-04 ENCOUNTER — Inpatient Hospital Stay
Admission: EM | Admit: 2014-12-04 | Discharge: 2014-12-15 | DRG: 871 | Disposition: A | Discharge status: Other Acute Inpatient Hospital | Payer: Medicare Other | Attending: Internal Medicine | Admitting: Internal Medicine

## 2014-12-04 DIAGNOSIS — Z9081 Acquired absence of spleen: Secondary | ICD-10-CM | POA: Diagnosis present

## 2014-12-04 DIAGNOSIS — T380X5A Adverse effect of glucocorticoids and synthetic analogues, initial encounter: Secondary | ICD-10-CM | POA: Diagnosis present

## 2014-12-04 DIAGNOSIS — J449 Chronic obstructive pulmonary disease, unspecified: Secondary | ICD-10-CM | POA: Diagnosis present

## 2014-12-04 DIAGNOSIS — E340 Carcinoid syndrome: Secondary | ICD-10-CM | POA: Diagnosis present

## 2014-12-04 DIAGNOSIS — D3A098 Benign carcinoid tumors of other sites: Secondary | ICD-10-CM | POA: Diagnosis present

## 2014-12-04 DIAGNOSIS — E440 Moderate protein-calorie malnutrition: Secondary | ICD-10-CM | POA: Diagnosis present

## 2014-12-04 DIAGNOSIS — N179 Acute kidney failure, unspecified: Secondary | ICD-10-CM | POA: Diagnosis present

## 2014-12-04 DIAGNOSIS — J189 Pneumonia, unspecified organism: Secondary | ICD-10-CM | POA: Diagnosis present

## 2014-12-04 DIAGNOSIS — J9601 Acute respiratory failure with hypoxia: Secondary | ICD-10-CM

## 2014-12-04 DIAGNOSIS — A419 Sepsis, unspecified organism: Secondary | ICD-10-CM | POA: Diagnosis not present

## 2014-12-04 DIAGNOSIS — J441 Chronic obstructive pulmonary disease with (acute) exacerbation: Secondary | ICD-10-CM | POA: Diagnosis present

## 2014-12-04 DIAGNOSIS — Z9981 Dependence on supplemental oxygen: Secondary | ICD-10-CM

## 2014-12-04 DIAGNOSIS — Z8249 Family history of ischemic heart disease and other diseases of the circulatory system: Secondary | ICD-10-CM

## 2014-12-04 DIAGNOSIS — Z825 Family history of asthma and other chronic lower respiratory diseases: Secondary | ICD-10-CM

## 2014-12-04 DIAGNOSIS — D72829 Elevated white blood cell count, unspecified: Secondary | ICD-10-CM | POA: Diagnosis present

## 2014-12-04 DIAGNOSIS — I129 Hypertensive chronic kidney disease with stage 1 through stage 4 chronic kidney disease, or unspecified chronic kidney disease: Secondary | ICD-10-CM | POA: Diagnosis present

## 2014-12-04 DIAGNOSIS — J129 Viral pneumonia, unspecified: Secondary | ICD-10-CM | POA: Diagnosis present

## 2014-12-04 DIAGNOSIS — R6521 Severe sepsis with septic shock: Secondary | ICD-10-CM | POA: Diagnosis present

## 2014-12-04 DIAGNOSIS — Z903 Acquired absence of stomach [part of]: Secondary | ICD-10-CM | POA: Diagnosis present

## 2014-12-04 DIAGNOSIS — K219 Gastro-esophageal reflux disease without esophagitis: Secondary | ICD-10-CM | POA: Diagnosis present

## 2014-12-04 DIAGNOSIS — I4891 Unspecified atrial fibrillation: Secondary | ICD-10-CM | POA: Diagnosis present

## 2014-12-04 DIAGNOSIS — Z7982 Long term (current) use of aspirin: Secondary | ICD-10-CM

## 2014-12-04 DIAGNOSIS — J9621 Acute and chronic respiratory failure with hypoxia: Secondary | ICD-10-CM | POA: Diagnosis present

## 2014-12-04 DIAGNOSIS — Z681 Body mass index (BMI) 19 or less, adult: Secondary | ICD-10-CM

## 2014-12-04 DIAGNOSIS — E43 Unspecified severe protein-calorie malnutrition: Secondary | ICD-10-CM | POA: Diagnosis present

## 2014-12-04 DIAGNOSIS — I5022 Chronic systolic (congestive) heart failure: Secondary | ICD-10-CM | POA: Diagnosis present

## 2014-12-04 DIAGNOSIS — Z95828 Presence of other vascular implants and grafts: Secondary | ICD-10-CM

## 2014-12-04 DIAGNOSIS — N189 Chronic kidney disease, unspecified: Secondary | ICD-10-CM | POA: Diagnosis present

## 2014-12-04 HISTORY — DX: Heart failure, unspecified: I50.9

## 2014-12-04 HISTORY — DX: Chronic obstructive pulmonary disease, unspecified: J44.9

## 2014-12-04 LAB — URINALYSIS COMPLETE WITH MICROSCOPIC (ARMC ONLY)
BILIRUBIN URINE: NEGATIVE
GLUCOSE, UA: 50 mg/dL — AB
Ketones, ur: NEGATIVE mg/dL
Leukocytes, UA: NEGATIVE
Nitrite: NEGATIVE
PROTEIN: 100 mg/dL — AB
SPECIFIC GRAVITY, URINE: 1.016 (ref 1.005–1.030)
pH: 5 (ref 5.0–8.0)

## 2014-12-04 LAB — PROTIME-INR
INR: 1.36
PROTHROMBIN TIME: 17 s — AB (ref 11.4–15.0)

## 2014-12-04 LAB — BLOOD GAS, ARTERIAL
ACID-BASE DEFICIT: 5.7 mmol/L — AB (ref 0.0–2.0)
Allens test (pass/fail): POSITIVE — AB
Bicarbonate: 19.4 mEq/L — ABNORMAL LOW (ref 21.0–28.0)
FIO2: 1 %
O2 Saturation: 79.7 %
PCO2 ART: 36 mmHg (ref 32.0–48.0)
PH ART: 7.34 — AB (ref 7.350–7.450)
PO2 ART: 47 mmHg — AB (ref 83.0–108.0)
Patient temperature: 37

## 2014-12-04 LAB — APTT: aPTT: 31 seconds (ref 24–36)

## 2014-12-04 MED ORDER — PIPERACILLIN-TAZOBACTAM 3.375 G IVPB
INTRAVENOUS | Status: AC
  Filled 2014-12-04: qty 50

## 2014-12-04 MED ORDER — VANCOMYCIN HCL IN DEXTROSE 1-5 GM/200ML-% IV SOLN
INTRAVENOUS | Status: AC
  Filled 2014-12-04: qty 200

## 2014-12-04 MED ORDER — SODIUM CHLORIDE 0.9 % IV BOLUS (SEPSIS)
2000.0000 mL | Freq: Once | INTRAVENOUS | Status: AC
  Administered 2014-12-04: 1000 mL via INTRAVENOUS

## 2014-12-04 MED ORDER — VANCOMYCIN HCL IN DEXTROSE 1-5 GM/200ML-% IV SOLN
1000.0000 mg | Freq: Once | INTRAVENOUS | Status: AC
  Administered 2014-12-04: 1000 mg via INTRAVENOUS

## 2014-12-04 MED ORDER — PIPERACILLIN-TAZOBACTAM 3.375 G IVPB 30 MIN
3.3750 g | Freq: Once | INTRAVENOUS | Status: AC
  Administered 2014-12-04: 3.375 g via INTRAVENOUS

## 2014-12-04 NOTE — ED Notes (Signed)
According to EMS, pt notified 911 that she had diffculty breathing. Upon arrival to the scene pt was O2 sats at 76% Non-Rebeather

## 2014-12-04 NOTE — ED Provider Notes (Signed)
[facility] Emergency Department Provider Note  ____________________________________________  Time seen: 9:10 PM, on arrival by EMS  I have reviewed the triage vital signs and the nursing notes.   HISTORY  Chief Complaint Respiratory Distress  History limited by critical illness. History obtained by patient, EMS, daughter, Sister  HPI Veronica Wang is a 79 y.o. female who is brought to the ED for difficulty breathing. The patient has been treated over the last couple of days for some mild dyspnea and was told to increase her oxygen use at home. However she was having an oxygen tank malfunctioning was not getting any oxygen delivery. She is also just had malaise and a coarse cough. No fever chest pain passing out. This evening, the family noted that the patient seemed much worse was having severe respiratory distress and lethargic so they called EMS. EMS reports that they were unable to obtain a good oxygen saturation but thought that it was a proximal 75 today put her on a nonrebreather which increases to 88%. They noted poor pulses and difficulty obtaining blood pressure.     Past Medical History  Diagnosis Date  . Hypertension   . GERD (gastroesophageal reflux disease)   . Cancer     Liver  . Cancer     stomach  . FH: chemotherapy   . Radiation     Patient Active Problem List   Diagnosis Date Noted  . 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 Chronic systolic heart failure 35/32/9924    Past Surgical History  Procedure Laterality Date  . Colon resection    . Cataract extraction      Current Outpatient Rx  Name  Route  Sig  Dispense  Refill  . aspirin 81 MG tablet   Oral   Take 81 mg by mouth daily.         . carvedilol (COREG) 12.5 MG tablet   Oral   Take 12.5 mg by mouth 2 (two) times daily with a meal.         . Cholecalciferol (VITAMIN D3) 1000 UNITS CAPS   Oral   Take 2 capsules by mouth daily.         . furosemide  (LASIX) 20 MG tablet   Oral   Take 20 mg by mouth every other day.         . gabapentin (NEURONTIN) 600 MG tablet   Oral   Take 1,200 mg by mouth 3 (three) times daily.         Marland Kitchen lisinopril (PRINIVIL,ZESTRIL) 2.5 MG tablet   Oral   Take 2.5 mg by mouth daily.         . Multiple Vitamin (MULTIVITAMIN) capsule   Oral   Take 1 capsule by mouth daily.         Marland Kitchen octreotide (SANDOSTATIN LAR) 10 MG injection   Intramuscular   Inject 10 mg into the muscle every 28 (twenty-eight) days.           Allergies No known allergies  Family History  Problem Relation Age of Onset  . Heart attack Sister   . Heart attack Brother     Social History History  Substance Use Topics  . Smoking status: Not on file  . Smokeless tobacco: Not on file  . Alcohol Use: Not on file    Review of Systems  Constitutional: No fever or chills. No weight changes. Malaise Eyes:No blurry vision or double vision.  ENT: No sore throat. Cardiovascular:  No chest pain. Respiratory: Shortness of breath and cough Gastrointestinal: Negative for abdominal pain, vomiting and diarrhea.  No BRBPR or melena. Genitourinary: Negative for dysuria, urinary retention, bloody urine, or difficulty urinating. Musculoskeletal: Negative for back pain. No joint swelling or pain. Skin: Negative for rash. Neurological: Negative for headaches, focal weakness or numbness. Psychiatric:No anxiety or depression.   Endocrine:No hot/cold intolerance, changes in energy, or sleep difficulty.  10-point ROS otherwise negative.  ____________________________________________   PHYSICAL EXAM:  VITAL SIGNS: ED Triage Vitals  Enc Vitals Group     BP 12/04/14 2108 113/93 mmHg     Pulse Rate 12/04/14 2108 88     Resp 12/04/14 2108 23     Temp 12/04/14 2108 99.8 F (37.7 C)     Temp Source 12/04/14 2108 Oral     SpO2 12/04/14 2108 88 %     Weight 12/04/14 2108 87 lb (39.463 kg)     Height 12/04/14 2108 5\' 3"  (1.6 m)      Head Cir --      Peak Flow --      Pain Score --      Pain Loc --      Pain Edu? --      Excl. in [place]? --      Constitutional: Lethargic, arousable, responds to questions, severe respiratory distress. Eyes: No scleral icterus. No conjunctival pallor. PERRL. EOMI ENT   Head: Normocephalic and atraumatic.   Nose: No congestion/rhinnorhea. No septal hematoma   Mouth/Throat: Dry mucous membranes, no pharyngeal erythema. No peritonsillar mass. No uvula shift.   Neck: No stridor. No SubQ emphysema. No meningismus. Hematological/Lymphatic/Immunilogical: No cervical lymphadenopathy. Cardiovascular: RRR. Normal and symmetric distal pulses are present in all extremities. No murmurs, rubs, or gallops. Respiratory: Bronchial breath sounds diffusely. Air movement bilaterally with increased work of breathing and tachypnea Gastrointestinal: Soft and nontender. No distention. There is no CVA tenderness.  No rebound, rigidity, or guarding. Genitourinary: deferred Musculoskeletal: Nontender with normal range of motion in all extremities. No joint effusions.  No lower extremity tenderness.  No edema. Neurologic:   Normal speech and language.  CN 2-10 normal. Motor grossly intact. No pronator drift.  Normal gait. No gross focal neurologic deficits are appreciated.  Skin:  Skin is warm, dry and intact. No rash noted.  No petechiae, purpura, or bullae. Psychiatric: Mood and affect are normal. Speech and behavior are normal. Patient exhibits appropriate insight and judgment.  Bedside ultrasound by me reveals no pneumothorax, no AAA or abdominal free fluid or pericardial fluid. Cardiac ejection fraction is grossly normal without ventricular dilatation or bowing of the septum  ____________________________________________    LABS (pertinent positives/negatives) (all labs ordered are listed, but only abnormal results are displayed) Labs Reviewed  BLOOD GAS, ARTERIAL - Abnormal; Notable for the  following:    pH, Arterial 7.34 (*)    pO2, Arterial 47 (*)    Bicarbonate 19.4 (*)    Acid-base deficit 5.7 (*)    Allens test (pass/fail) POSITIVE (*)    All other components within normal limits  PROTIME-INR - Abnormal; Notable for the following:    Prothrombin Time 17.0 (*)    All other components within normal limits  URINALYSIS COMPLETEWITH MICROSCOPIC ([facility] ONLY) - Abnormal; Notable for the following:    Color, Urine AMBER (*)    APPearance HAZY (*)    Glucose, UA 50 (*)    Hgb urine dipstick 1+ (*)    Protein, ur 100 (*)    Bacteria,  UA RARE (*)    Squamous Epithelial / LPF 0-5 (*)    All other components within normal limits  CULTURE, BLOOD (ROUTINE X 2)  URINE CULTURE  CULTURE, EXPECTORATED SPUTUM-ASSESSMENT  CULTURE, BLOOD (ROUTINE X 2)  CULTURE, BLOOD (ROUTINE X 2)  APTT  CBC WITH DIFFERENTIAL/PLATELET  HEPATIC FUNCTION PANEL  LIPASE, BLOOD  TROPONIN I  LACTIC ACID, PLASMA  LACTIC ACID, PLASMA   ____________________________________________   EKG  Interpreted by me Normal sinus rhythm rate of 90, normal axis and intervals. Inferior Q waves, otherwise normal QRS, normal ST segments, T-wave inversions in V5 E6  ____________________________________________    RADIOLOGY  Chest x-ray reveals bibasilar infiltrates. Interpreted by me, radiology report reviewed  ____________________________________________   PROCEDURES CRITICAL CARE Performed by: Joni Fears, Dinora Hemm   Total critical care time: 35 minutes  Critical care time was exclusive of separately billable procedures and treating other patients.  Critical care was necessary to treat or prevent imminent or life-threatening deterioration.  Critical care was time spent personally by me on the following activities: development of treatment plan with patient and/or surrogate as well as nursing, discussions with consultants, evaluation of patient's response to treatment, examination of patient, obtaining  history from patient or surrogate, ordering and performing treatments and interventions, ordering and review of laboratory studies, ordering and review of radiographic studies, pulse oximetry and re-evaluation of patient's condition.  ____________________________________________   INITIAL IMPRESSION / ASSESSMENT AND PLAN / ED COURSE  Pertinent labs & imaging results that were available during my care of the patient were reviewed by me and considered in my medical decision making (see chart for details).  Patient presents with sepsis from a clinically apparent pneumonia. Is hypoxic acute respiratory failure. We'll give a 30 mL per kilo bolus of saline, started vancomycin and Zosyn empirically, check labs ABG cultures and plan to admit. Will start on BiPAP since she is only 88% on nonrebreather and may require intubation if she does not improve.  ----------------------------------------- 11:11 PM on 12/04/2014 -----------------------------------------  Blood pressure declined to about 70/40 with a map of 55. IV fluids infusing with pressure bags. We will reassess for fluid responsiveness. I did discuss with the daughter the possible need for central venous catheter placement and vasopressor use if her blood pressure does not improve with fluids. ----------------------------------------- 12:03 AM on 12/05/2014 -----------------------------------------  Map increased to 70 with pressure bag fluids. I placed a new IV under ultrasound visualization. I discussed the patient's case with Dr. Reece Levy the hospitalist for admission to the ICU. ____________________________________________   FINAL CLINICAL IMPRESSION(S) / ED DIAGNOSES  Final diagnoses:  Sepsis due to pneumonia  Acute respiratory failure with hypoxia      Carrie Mew, MD 12/05/14 0004

## 2014-12-05 ENCOUNTER — Inpatient Hospital Stay
Admit: 2014-12-05 | Discharge: 2014-12-05 | Disposition: A | Discharge status: Home / Self Care | Payer: Medicare Other | Attending: Internal Medicine | Admitting: Internal Medicine

## 2014-12-05 ENCOUNTER — Encounter: Payer: Self-pay | Admitting: Internal Medicine

## 2014-12-05 DIAGNOSIS — J439 Emphysema, unspecified: Secondary | ICD-10-CM | POA: Diagnosis not present

## 2014-12-05 DIAGNOSIS — I4891 Unspecified atrial fibrillation: Secondary | ICD-10-CM | POA: Diagnosis present

## 2014-12-05 DIAGNOSIS — Z9981 Dependence on supplemental oxygen: Secondary | ICD-10-CM | POA: Diagnosis not present

## 2014-12-05 DIAGNOSIS — N179 Acute kidney failure, unspecified: Secondary | ICD-10-CM | POA: Diagnosis present

## 2014-12-05 DIAGNOSIS — I129 Hypertensive chronic kidney disease with stage 1 through stage 4 chronic kidney disease, or unspecified chronic kidney disease: Secondary | ICD-10-CM | POA: Diagnosis present

## 2014-12-05 DIAGNOSIS — Z825 Family history of asthma and other chronic lower respiratory diseases: Secondary | ICD-10-CM | POA: Diagnosis not present

## 2014-12-05 DIAGNOSIS — E43 Unspecified severe protein-calorie malnutrition: Secondary | ICD-10-CM | POA: Diagnosis present

## 2014-12-05 DIAGNOSIS — R6521 Severe sepsis with septic shock: Secondary | ICD-10-CM | POA: Diagnosis present

## 2014-12-05 DIAGNOSIS — J129 Viral pneumonia, unspecified: Secondary | ICD-10-CM | POA: Diagnosis present

## 2014-12-05 DIAGNOSIS — T380X5A Adverse effect of glucocorticoids and synthetic analogues, initial encounter: Secondary | ICD-10-CM | POA: Diagnosis present

## 2014-12-05 DIAGNOSIS — D72829 Elevated white blood cell count, unspecified: Secondary | ICD-10-CM | POA: Diagnosis present

## 2014-12-05 DIAGNOSIS — J449 Chronic obstructive pulmonary disease, unspecified: Secondary | ICD-10-CM | POA: Diagnosis present

## 2014-12-05 DIAGNOSIS — J189 Pneumonia, unspecified organism: Secondary | ICD-10-CM | POA: Diagnosis not present

## 2014-12-05 DIAGNOSIS — Z681 Body mass index (BMI) 19 or less, adult: Secondary | ICD-10-CM | POA: Diagnosis not present

## 2014-12-05 DIAGNOSIS — J9601 Acute respiratory failure with hypoxia: Secondary | ICD-10-CM | POA: Diagnosis not present

## 2014-12-05 DIAGNOSIS — Z8249 Family history of ischemic heart disease and other diseases of the circulatory system: Secondary | ICD-10-CM | POA: Diagnosis not present

## 2014-12-05 DIAGNOSIS — Z7982 Long term (current) use of aspirin: Secondary | ICD-10-CM | POA: Diagnosis not present

## 2014-12-05 DIAGNOSIS — J441 Chronic obstructive pulmonary disease with (acute) exacerbation: Secondary | ICD-10-CM | POA: Diagnosis present

## 2014-12-05 DIAGNOSIS — I5022 Chronic systolic (congestive) heart failure: Secondary | ICD-10-CM | POA: Diagnosis present

## 2014-12-05 DIAGNOSIS — Z903 Acquired absence of stomach [part of]: Secondary | ICD-10-CM | POA: Diagnosis present

## 2014-12-05 DIAGNOSIS — E340 Carcinoid syndrome: Secondary | ICD-10-CM | POA: Diagnosis present

## 2014-12-05 DIAGNOSIS — A419 Sepsis, unspecified organism: Secondary | ICD-10-CM | POA: Diagnosis present

## 2014-12-05 DIAGNOSIS — J9690 Respiratory failure, unspecified, unspecified whether with hypoxia or hypercapnia: Secondary | ICD-10-CM | POA: Diagnosis present

## 2014-12-05 DIAGNOSIS — Z9081 Acquired absence of spleen: Secondary | ICD-10-CM | POA: Diagnosis present

## 2014-12-05 DIAGNOSIS — K219 Gastro-esophageal reflux disease without esophagitis: Secondary | ICD-10-CM | POA: Diagnosis present

## 2014-12-05 DIAGNOSIS — N189 Chronic kidney disease, unspecified: Secondary | ICD-10-CM | POA: Diagnosis present

## 2014-12-05 DIAGNOSIS — J9621 Acute and chronic respiratory failure with hypoxia: Secondary | ICD-10-CM | POA: Diagnosis present

## 2014-12-05 LAB — CBC
HCT: 46.1 % (ref 35.0–47.0)
Hemoglobin: 15.1 g/dL (ref 12.0–16.0)
MCH: 28.5 pg (ref 26.0–34.0)
MCHC: 32.8 g/dL (ref 32.0–36.0)
MCV: 86.9 fL (ref 80.0–100.0)
PLATELETS: 127 10*3/uL — AB (ref 150–440)
RBC: 5.3 MIL/uL — ABNORMAL HIGH (ref 3.80–5.20)
RDW: 15.1 % — ABNORMAL HIGH (ref 11.5–14.5)
WBC: 7.5 10*3/uL (ref 3.6–11.0)

## 2014-12-05 LAB — TROPONIN I
TROPONIN I: 0.04 ng/mL — AB (ref <0.031)
TROPONIN I: 0.26 ng/mL — AB (ref <0.031)
Troponin I: 0.04 ng/mL — ABNORMAL HIGH (ref <0.031)
Troponin I: 0.06 ng/mL — ABNORMAL HIGH (ref <0.031)

## 2014-12-05 LAB — CBC WITH DIFFERENTIAL/PLATELET
Basophils Absolute: 0 10*3/uL (ref 0–0.1)
EOS ABS: 0 10*3/uL (ref 0–0.7)
Eosinophils Relative: 0 %
HEMATOCRIT: 50.9 % — AB (ref 35.0–47.0)
Hemoglobin: 16.4 g/dL — ABNORMAL HIGH (ref 12.0–16.0)
Lymphs Abs: 0.6 10*3/uL — ABNORMAL LOW (ref 1.0–3.6)
MCH: 27.9 pg (ref 26.0–34.0)
MCHC: 32.3 g/dL (ref 32.0–36.0)
MCV: 86.4 fL (ref 80.0–100.0)
MONO ABS: 0.1 10*3/uL — AB (ref 0.2–0.9)
Neutro Abs: 9.3 10*3/uL — ABNORMAL HIGH (ref 1.4–6.5)
Platelets: 151 10*3/uL (ref 150–440)
RBC: 5.88 MIL/uL — ABNORMAL HIGH (ref 3.80–5.20)
RDW: 15.3 % — ABNORMAL HIGH (ref 11.5–14.5)
WBC: 10 10*3/uL (ref 3.6–11.0)

## 2014-12-05 LAB — BASIC METABOLIC PANEL
ANION GAP: 11 (ref 5–15)
Anion gap: 16 — ABNORMAL HIGH (ref 5–15)
BUN: 39 mg/dL — AB (ref 6–20)
BUN: 39 mg/dL — ABNORMAL HIGH (ref 6–20)
CALCIUM: 7.4 mg/dL — AB (ref 8.9–10.3)
CHLORIDE: 111 mmol/L (ref 101–111)
CO2: 22 mmol/L (ref 22–32)
CO2: 19 mmol/L — ABNORMAL LOW (ref 22–32)
CREATININE: 2.65 mg/dL — AB (ref 0.44–1.00)
Calcium: 8.5 mg/dL — ABNORMAL LOW (ref 8.9–10.3)
Chloride: 103 mmol/L (ref 101–111)
Creatinine, Ser: 2.12 mg/dL — ABNORMAL HIGH (ref 0.44–1.00)
GFR calc Af Amer: 19 mL/min — ABNORMAL LOW (ref >60)
GFR calc non Af Amer: 16 mL/min — ABNORMAL LOW (ref >60)
GFR calc non Af Amer: 22 mL/min — ABNORMAL LOW (ref >60)
GFR, EST AFRICAN AMERICAN: 25 mL/min — AB (ref >60)
GLUCOSE: 77 mg/dL (ref 65–99)
Glucose, Bld: 56 mg/dL — ABNORMAL LOW (ref 65–99)
POTASSIUM: 4.1 mmol/L (ref 3.5–5.1)
Potassium: 4.3 mmol/L (ref 3.5–5.1)
SODIUM: 141 mmol/L (ref 135–145)
Sodium: 141 mmol/L (ref 135–145)

## 2014-12-05 LAB — HEPATIC FUNCTION PANEL
ALT: 14 U/L (ref 14–54)
AST: 37 U/L (ref 15–41)
Albumin: 3.5 g/dL (ref 3.5–5.0)
Alkaline Phosphatase: 49 U/L (ref 38–126)
BILIRUBIN DIRECT: 0.3 mg/dL (ref 0.1–0.5)
Indirect Bilirubin: 0.4 mg/dL (ref 0.3–0.9)
TOTAL PROTEIN: 6.9 g/dL (ref 6.5–8.1)
Total Bilirubin: 0.7 mg/dL (ref 0.3–1.2)

## 2014-12-05 LAB — MRSA PCR SCREENING: MRSA BY PCR: NEGATIVE

## 2014-12-05 LAB — LACTIC ACID, PLASMA
LACTIC ACID, VENOUS: 4 mmol/L — AB (ref 0.5–2.0)
LACTIC ACID, VENOUS: 2.5 mmol/L — AB (ref 0.5–2.0)

## 2014-12-05 LAB — GLUCOSE, CAPILLARY
Glucose-Capillary: 58 mg/dL — ABNORMAL LOW (ref 65–99)
Glucose-Capillary: 78 mg/dL (ref 65–99)

## 2014-12-05 LAB — LIPASE, BLOOD: Lipase: 21 U/L — ABNORMAL LOW (ref 22–51)

## 2014-12-05 MED ORDER — ENSURE ENLIVE PO LIQD: 237.0000 mL | Freq: Three times a day (TID) | ORAL | Status: DC

## 2014-12-05 MED ORDER — CETYLPYRIDINIUM CHLORIDE 0.05 % MT LIQD
7.0000 mL | Freq: Two times a day (BID) | OROMUCOSAL | Status: DC
  Administered 2014-12-05 – 2014-12-07 (×6): 7 mL via OROMUCOSAL

## 2014-12-05 MED ORDER — ASPIRIN EC 81 MG PO TBEC: 81.0000 mg | DELAYED_RELEASE_TABLET | Freq: Every day | ORAL | Status: DC

## 2014-12-05 MED ORDER — ACETAMINOPHEN 325 MG PO TABS
650.0000 mg | ORAL_TABLET | Freq: Four times a day (QID) | ORAL | Status: DC | PRN
  Administered 2014-12-05 – 2014-12-11 (×6): 650 mg via ORAL
  Filled 2014-12-05 (×6): qty 2

## 2014-12-05 MED ORDER — CETYLPYRIDINIUM CHLORIDE 0.05 % MT LIQD
7.0000 mL | Freq: Two times a day (BID) | OROMUCOSAL | Status: DC
  Administered 2014-12-05 – 2014-12-07 (×4): 7 mL via OROMUCOSAL

## 2014-12-05 MED ORDER — CHLORHEXIDINE GLUCONATE 0.12 % MT SOLN
15.0000 mL | Freq: Two times a day (BID) | OROMUCOSAL | Status: DC
  Administered 2014-12-05 – 2014-12-07 (×5): 15 mL via OROMUCOSAL

## 2014-12-05 MED ORDER — AZITHROMYCIN 250 MG PO TABS
500.0000 mg | ORAL_TABLET | Freq: Every day | ORAL | Status: AC
  Administered 2014-12-05: 500 mg via ORAL
  Filled 2014-12-05: qty 2

## 2014-12-05 MED ORDER — METHYLPREDNISOLONE SODIUM SUCC 40 MG IJ SOLR
40.0000 mg | Freq: Four times a day (QID) | INTRAMUSCULAR | Status: DC
  Administered 2014-12-05: 40 mg via INTRAVENOUS
  Filled 2014-12-05: qty 1

## 2014-12-05 MED ORDER — PIPERACILLIN-TAZOBACTAM 3.375 G IVPB
3.3750 g | Freq: Two times a day (BID) | INTRAVENOUS | Status: DC
  Administered 2014-12-05 – 2014-12-06 (×3): 3.375 g via INTRAVENOUS
  Filled 2014-12-05 (×5): qty 50

## 2014-12-05 MED ORDER — ADULT MULTIVITAMIN W/MINERALS CH
1.0000 | ORAL_TABLET | Freq: Every day | ORAL | Status: DC
  Administered 2014-12-05 – 2014-12-15 (×11): 1 via ORAL
  Filled 2014-12-05 (×11): qty 1

## 2014-12-05 MED ORDER — ASPIRIN EC 81 MG PO TBEC
81.0000 mg | DELAYED_RELEASE_TABLET | Freq: Every day | ORAL | Status: DC
  Administered 2014-12-05 – 2014-12-15 (×11): 81 mg via ORAL
  Filled 2014-12-05 (×12): qty 1

## 2014-12-05 MED ORDER — HEPARIN SODIUM (PORCINE) 5000 UNIT/ML IJ SOLN
5000.0000 [IU] | Freq: Three times a day (TID) | INTRAMUSCULAR | Status: DC
  Administered 2014-12-05 – 2014-12-15 (×31): 5000 [IU] via SUBCUTANEOUS
  Filled 2014-12-05 (×31): qty 1

## 2014-12-05 MED ORDER — DIGOXIN 0.25 MG/ML IJ SOLN
0.1250 mg | INTRAMUSCULAR | Status: AC
Start: 2014-12-05 — End: 2014-12-06
  Administered 2014-12-05 – 2014-12-06 (×4): 0.125 mg via INTRAVENOUS
  Filled 2014-12-05 (×5): qty 2

## 2014-12-05 MED ORDER — SODIUM CHLORIDE 0.9 % IV SOLN
INTRAVENOUS | Status: DC
  Administered 2014-12-05: 03:00:00 via INTRAVENOUS

## 2014-12-05 MED ORDER — ACETAMINOPHEN 650 MG RE SUPP: 650.0000 mg | Freq: Four times a day (QID) | RECTAL | Status: DC | PRN

## 2014-12-05 MED ORDER — GABAPENTIN 600 MG PO TABS
1200.0000 mg | ORAL_TABLET | Freq: Three times a day (TID) | ORAL | Status: DC
  Administered 2014-12-05 – 2014-12-15 (×31): 1200 mg via ORAL
  Filled 2014-12-05 (×32): qty 2

## 2014-12-05 MED ORDER — ENSURE ENLIVE PO LIQD
237.0000 mL | Freq: Three times a day (TID) | ORAL | Status: DC
  Administered 2014-12-05 – 2014-12-12 (×18): 237 mL via ORAL

## 2014-12-05 MED ORDER — ONDANSETRON HCL 4 MG PO TABS: 4.0000 mg | ORAL_TABLET | Freq: Four times a day (QID) | ORAL | Status: DC | PRN

## 2014-12-05 MED ORDER — METHYLPREDNISOLONE SODIUM SUCC 40 MG IJ SOLR
40.0000 mg | Freq: Three times a day (TID) | INTRAMUSCULAR | Status: DC
  Administered 2014-12-05 (×3): 40 mg via INTRAVENOUS
  Filled 2014-12-05 (×3): qty 1

## 2014-12-05 MED ORDER — AZITHROMYCIN 250 MG PO TABS
250.0000 mg | ORAL_TABLET | Freq: Every day | ORAL | Status: DC
  Administered 2014-12-06: 250 mg via ORAL
  Filled 2014-12-05: qty 1

## 2014-12-05 MED ORDER — ONDANSETRON HCL 4 MG/2ML IJ SOLN: 4.0000 mg | Freq: Four times a day (QID) | INTRAMUSCULAR | Status: DC | PRN

## 2014-12-05 MED ORDER — VANCOMYCIN HCL 500 MG IV SOLR: 500.0000 mg | INTRAVENOUS | Status: DC

## 2014-12-05 MED ORDER — IPRATROPIUM BROMIDE 0.02 % IN SOLN
0.5000 mg | Freq: Four times a day (QID) | RESPIRATORY_TRACT | Status: DC
  Administered 2014-12-05 – 2014-12-08 (×12): 0.5 mg via RESPIRATORY_TRACT
  Filled 2014-12-05 (×13): qty 2.5

## 2014-12-05 MED ORDER — VITAMIN D 1000 UNITS PO TABS
2000.0000 [IU] | ORAL_TABLET | Freq: Every day | ORAL | Status: DC
  Administered 2014-12-05 – 2014-12-15 (×11): 2000 [IU] via ORAL
  Filled 2014-12-05 (×11): qty 2

## 2014-12-05 NOTE — Progress Notes (Signed)
*  PRELIMINARY RESULTS* Echocardiogram 2D Echocardiogram has been performed.  Veronica Wang 12/05/2014, 9:12 AM

## 2014-12-05 NOTE — Progress Notes (Signed)
Patient in no distress at this time. HR not consistent at this time. O2 saturation constantly changing as patient talks and moves her hand. Patient in no distress at this time. svn given on 10liters o2. bipap on standby. Will continue to monitor for any additional respiratory changes and will modify according. Reported finds to rn

## 2014-12-05 NOTE — Progress Notes (Signed)
Veronica Wang is a 79 y.o. female   SUBJECTIVE:  Patient admitted with septic shock and bilateral lower pneumonia. Baseline systolic pressure patient reports is in the low 80s. Patient off BiPAP this morning on nasal cannula. Known congestive heart failure.  ______________________________________________________________________  ROS: Review of systems is unremarkable for any active cardiac,respiratory, GI, GU, hematologic, neurologic or psychiatric systems, 10 systems reviewed.  Marland Kitchen antiseptic oral rinse  7 mL Mouth Rinse q12n4p  . antiseptic oral rinse  7 mL Mouth Rinse BID  . aspirin EC  81 mg Oral Daily  . azithromycin  500 mg Oral Daily   Followed by  . [START ON 12/06/2014] azithromycin  250 mg Oral Daily  . chlorhexidine  15 mL Mouth Rinse BID  . cholecalciferol  2,000 Units Oral Daily  . gabapentin  1,200 mg Oral TID  . heparin  5,000 Units Subcutaneous 3 times per day  . ipratropium  0.5 mg Nebulization Q6H  . methylPREDNISolone (SOLU-MEDROL) injection  40 mg Intravenous TID  . multivitamin with minerals  1 tablet Oral Daily  . piperacillin-tazobactam (ZOSYN)  IV  3.375 g Intravenous Q12H  . [START ON 12/07/2014] vancomycin  500 mg Intravenous Q48H   acetaminophen **OR** acetaminophen, ondansetron **OR** ondansetron (ZOFRAN) IV   Past Medical History  Diagnosis Date  . Hypertension   . GERD (gastroesophageal reflux disease)   . Cancer     Liver  . Cancer     stomach  . FH: chemotherapy   . Radiation   . COPD (chronic obstructive pulmonary disease)   . CHF (congestive heart failure)     Past Surgical History  Procedure Laterality Date  . Colon resection    . Cataract extraction  splenectomy  . Colon surgery      PHYSICAL EXAM:  BP 84/52 mmHg  Pulse 90  Temp(Src) 98.6 F (37 C) (Axillary)  Resp 19  Ht 5\' 3"  (1.6 m)  Wt 35.3 kg (77 lb 13.2 oz)  BMI 13.79 kg/m2  SpO2 96%  Wt Readings from Last 3 Encounters:  12/05/14 35.3 kg (77 lb 13.2 oz)  06/26/14  37.649 kg (83 lb)           BP Readings from Last 3 Encounters:  12/05/14 84/52  11/20/14 126/73  06/26/14 148/79    Constitutional: NAD Neck: supple, no thyromegaly Respiratory: Bilateral rhonchi, wheezing throughout, decreased breath sounds bilateral bases Cardiovascular: RRR, no murmur, no gallop Abdomen: soft, good BS, nontender Extremities: no edema Neuro: alert and oriented, no focal motor or sensory deficits  ASSESSMENT/PLAN:  Labs and imaging studies were reviewed  Septic shock/bilateral pneumonia/acute respiratory failure-on IV Zosyn/vancomycin, by mouth azithromycin, on home O2, now 5 L, wean as able, IV Solu-Medrol  COPD-chronic, continue Spiriva CHF-off IV fluids closely, baseline systolic pressure in the 35T CKD-creatinine better at 2.1

## 2014-12-05 NOTE — Progress Notes (Signed)
Initial Nutrition Assessment  DOCUMENTATION CODES:  Non-severe (moderate) malnutrition in context of chronic illness  INTERVENTION: Meals and Snacks: Cater to patient preferences Medical Food Supplement Therapy: will recommend Ensure Enlive (each supplement provides 350kcal and 20 grams of protein) TID, will also send Magic Cup daily   NUTRITION DIAGNOSIS:  Inadequate oral intake related to acute illness as evidenced by meal completion < 25%.  GOAL:  Patient will meet greater than or equal to 90% of their needs  MONITOR:   (Energy Intake, Electrolyte and renal Profile, Pulmonary Profile)  REASON FOR ASSESSMENT:   (RD Screen, BMI 15.4)    ASSESSMENT:  Pt admitted with septic shock secondary to pna PMHx:  Past Medical History  Diagnosis Date  . Hypertension   . GERD (gastroesophageal reflux disease)   . Cancer     Liver  . Cancer     stomach  . FH: chemotherapy   . Radiation   . COPD (chronic obstructive pulmonary disease)   . CHF (congestive heart failure)    PO Intake: pt reports eating breakfast this am but could not remember what she ate however per RN Malachy Mood pt did not eat but bites/sips. Pt reports PTA eating breakfast, dinner and supper most days but small amounts. Pt reports not drinking a supplement recently but has in the past.   Medications: vitamin D, Solumedrol, MVI Labs: Electrolyte and Renal Profile:    Recent Labs Lab 12/04/14 2115 12/05/14 0456  BUN 39* 39*  CREATININE 2.65* 2.12*  NA 141 141  K 4.3 4.1   Glucose Profile:   Recent Labs  12/05/14 0319 12/05/14 0434  GLUCAP 58* 78   Protein Profile:   Recent Labs Lab 12/04/14 2137  ALBUMIN 3.5    Pt reports UBW of 79-81lbs. Current measured weight 77lbs (2.5% weight loss from UBW). Per [facility] weight of 83lbs 06/26/2014 (7% weight loss in 6 months)  Height:  Ht Readings from Last 1 Encounters:  12/04/14 5\' 3"  (1.6 m)    Weight:  Wt Readings from Last 1 Encounters:   12/05/14 77 lb 13.2 oz (35.3 kg)    Ideal Body Weight:  52.3kg  Wt Readings from Last 10 Encounters:  12/05/14 77 lb 13.2 oz (35.3 kg)  06/26/14 83 lb (37.649 kg)    BMI:  Body mass index is 13.79 kg/(m^2).   Nutrition-Focused physical exam completed. Findings are mild/moderate fat depletion of triceps and fat overlying ribs and orbital, severe muscle depletion of calf, patella and thigh with moderate-severe muscle depletion of shoulder, scapula and clavice, and no edema.    Estimated Nutritional Needs:  Kcal:  1296-1532kcals, BEE: 982kcals, TEE: (IF 1.1-1.3)(AF 1.2) using IBW of 52.3kg  Protein:  52-63g protein (1.0-1.2g/kg) using IBW of 52.3kg  Fluid:  1308-1561mL of fluid (25-34mL/kg) using IBW of 52.3kg  Skin:  Reviewed, no issues  Diet Order:  Diet Heart Room service appropriate?: Yes; Fluid consistency:: Thin  EDUCATION NEEDS:  Education needs no appropriate at this time   Intake/Output Summary (Last 24 hours) at 12/05/14 1255 Last data filed at 12/05/14 1000  Gross per 24 hour  Intake    805 ml  Output    300 ml  Net    505 ml    Last BM:  5/28   HIGH Care Level  Dwyane Luo, RD, LDN Pager [phone]

## 2014-12-05 NOTE — Progress Notes (Signed)
ANTIBIOTIC CONSULT NOTE - INITIAL  Pharmacy Consult for Vancomycin and Zosyn Indication: pneumonia  Allergies  Allergen Reactions  . No Known Allergies     Patient Measurements: Height: 5\' 3"  (160 cm) Weight: 87 lb (39.463 kg) IBW/kg (Calculated) : 52.4 Adjusted Body Weight: n/a  Vital Signs: Temp: 99.3 F (37.4 C) (05/27 2212) Temp Source: Rectal (05/27 2212) BP: 65/53 mmHg (05/28 0258) Pulse Rate: 94 (05/28 0258) Intake/Output from previous day:   Intake/Output from this shift:    Labs:  Recent Labs  12/04/14 2115  WBC 10.0  HGB 16.4*  PLT 151  CREATININE 2.65*   Estimated Creatinine Clearance: 11.3 mL/min (by C-G formula based on Cr of 2.65). No results for input(s): VANCOTROUGH, VANCOPEAK, VANCORANDOM, GENTTROUGH, GENTPEAK, GENTRANDOM, TOBRATROUGH, TOBRAPEAK, TOBRARND, AMIKACINPEAK, AMIKACINTROU, AMIKACIN in the last 72 hours.   Microbiology: No results found for this or any previous visit (from the past 720 hour(s)).  Medical History: Past Medical History  Diagnosis Date  . Hypertension   . GERD (gastroesophageal reflux disease)   . Cancer     Liver  . Cancer     stomach  . FH: chemotherapy   . Radiation   . COPD (chronic obstructive pulmonary disease)   . CHF (congestive heart failure)     Medications:  Anti-infectives    Start     Dose/Rate Route Frequency Ordered Stop   12/07/14 1800  vancomycin (VANCOCIN) 500 mg in sodium chloride 0.9 % 100 mL IVPB     500 mg 100 mL/hr over 60 Minutes Intravenous Every 48 hours 12/05/14 0327     12/05/14 1000  piperacillin-tazobactam (ZOSYN) IVPB 3.375 g     3.375 g 12.5 mL/hr over 240 Minutes Intravenous Every 12 hours 12/05/14 0327     12/04/14 2145  piperacillin-tazobactam (ZOSYN) IVPB 3.375 g     3.375 g 100 mL/hr over 30 Minutes Intravenous  Once 12/04/14 2131 12/04/14 2239   12/04/14 2145  vancomycin (VANCOCIN) IVPB 1000 mg/200 mL premix     1,000 mg 200 mL/hr over 60 Minutes Intravenous  Once  12/04/14 2131 12/04/14 2309     Assessment: Patient admitted for pneumonia and initiated on Zosyn and Vancomycin. Received Vancomycin 1g and Zosyn 3.375g IV in the ED 5/27 ~22:00.  Ke=0.014, t1/2=49.5hr, Vd=27.7L  Goal of Therapy:  Vancomycin trough level 15-20 mcg/ml  Plan:  Measure antibiotic drug levels at steady state Follow up culture results Zosyn 3.375g IV q12h EI  Vancomycin 500mg  IV q48h, will start 72hr after initial 1g dose.    Paulina Fusi, PharmD, BCPS 12/05/2014 3:35 AM

## 2014-12-05 NOTE — H&P (Signed)
[facility] [place] NAME: Veronica Wang    MR#:  [id number]  DATE OF BIRTH:  1936-02-28  DATE OF ADMISSION:  12/04/2014  PRIMARY CARE PHYSICIAN: Adin Hector, MD   REQUESTING/REFERRING PHYSICIAN: Dr. Carrie Mew  CHIEF COMPLAINT:   Chief Complaint  Patient presents with  . Respiratory Distress   cough with expectoration  HISTORY OF PRESENT ILLNESS:  Veronica Wang  is a 79 y.o. female with below mentioned past medical history on when necessary oxygen supplementation at home brought in with the complaints of ongoing shortness of breath with respiratory distress which worsened last night. According to the patient's daughter who is with the patient at this time, she has been having cough with shortness of breath for the past a few days for which she has seen her primary care practitioner and was advised to use oxygen. Since the patient had problems with her oxygen tank at home she could not use oxygen as advised and her shortness of breath worsened with ending in acute respiratory distress, hence brought to the emergency room for further evaluation. In the emergency room patient was evaluated by the ED physician and was noted to be with severe restrictive distress with hypoxia, was placed on BiPAP. Initially her blood pressure was within normal range but later on she went into hypotension and required normal saline boluses 2 L. Workup revealed chest x-ray significant for bilateral lower lobe infiltrates. Lactic acid was elevated at 4.0. CBC and BMP are pending at this time. LFTs normal. EKG normal sinus rhythm with ventricular rate of 90 bpm, ST-T changes in lateral leads. After obtaining blood and urine cultures patient was started on broad-spectrum antibiotics namely vancomycin and Zosyn by the ED physician and was continued on BiPAP. Patient was initially lethargic but subsequently on oxygen supplementation her mental status has improved and  currently alert awake and oriented 3. She is still hypotensive and receiving IV fluid maintenance doses. No history of any fever or chills per patient's daughter. No nausea, no vomiting, no abdominal pain, no dysuria. No chest pain, focal weakness or numbness.  PAST MEDICAL HISTORY:   Past Medical History  Diagnosis Date  . Hypertension   . GERD (gastroesophageal reflux disease)   . Cancer     Liver  . Cancer     stomach  . FH: chemotherapy   . Radiation   . COPD (chronic obstructive pulmonary disease)   . CHF (congestive heart failure)     PAST SURGICAL HISTORY:   Past Surgical History  Procedure Laterality Date  . Colon resection    . Cataract extraction  splenectomy  . Colon surgery      SOCIAL HISTORY:   History  Substance Use Topics  . Smoking status: Never Smoker   . Smokeless tobacco: Not on file  . Alcohol Use: No    FAMILY HISTORY:   Family History  Problem Relation Age of Onset  . Heart attack Sister   . Heart attack Brother     DRUG ALLERGIES:   Allergies  Allergen Reactions  . No Known Allergies     REVIEW OF SYSTEMS:   Review of Systems  Constitutional: Positive for malaise/fatigue. Negative for fever and chills.  HENT: Negative for ear pain, hearing loss, nosebleeds, sore throat and tinnitus.   Eyes: Negative for blurred vision, double vision, pain, discharge and redness.  Respiratory: Positive for cough, sputum production and shortness of breath. Negative for hemoptysis and  wheezing.   Cardiovascular: Negative for chest pain, palpitations, orthopnea and leg swelling.  Gastrointestinal: Negative for nausea, vomiting, abdominal pain, diarrhea, constipation, blood in stool and melena.  Genitourinary: Negative for dysuria, urgency, frequency and hematuria.  Musculoskeletal: Negative for back pain, joint pain and neck pain.  Skin: Negative for itching and rash.  Neurological: Negative for dizziness, tingling, sensory change, focal weakness and  seizures.  Endo/Heme/Allergies: Does not bruise/bleed easily.  Psychiatric/Behavioral: Negative for depression. The patient is not nervous/anxious.     MEDICATIONS AT HOME:   Prior to Admission medications   Medication Sig Start Date End Date Taking? Authorizing Provider  aspirin 81 MG tablet Take 81 mg by mouth daily.    Historical Provider, MD  carvedilol (COREG) 12.5 MG tablet Take 12.5 mg by mouth 2 (two) times daily with a meal.    Historical Provider, MD  Cholecalciferol (VITAMIN D3) 1000 UNITS CAPS Take 2 capsules by mouth daily.    Historical Provider, MD  furosemide (LASIX) 20 MG tablet Take 20 mg by mouth every other day.    Historical Provider, MD  gabapentin (NEURONTIN) 600 MG tablet Take 1,200 mg by mouth 3 (three) times daily.    Historical Provider, MD  lisinopril (PRINIVIL,ZESTRIL) 2.5 MG tablet Take 2.5 mg by mouth daily.    Historical Provider, MD  Multiple Vitamin (MULTIVITAMIN) capsule Take 1 capsule by mouth daily.    Historical Provider, MD  octreotide (SANDOSTATIN LAR) 10 MG injection Inject 10 mg into the muscle every 28 (twenty-eight) days.    Historical Provider, MD      VITAL SIGNS:  Blood pressure 80/53, pulse 101, temperature 99.3 F (37.4 C), temperature source Rectal, resp. rate 22, height 5\' 3"  (1.6 m), weight 39.463 kg (87 lb), SpO2 96 %.  PHYSICAL EXAMINATION:  Physical Exam  Constitutional: She is oriented to person, place, and time. She appears well-developed and well-nourished. No distress.  HENT:  Head: Normocephalic and atraumatic.  Right Ear: External ear normal.  Left Ear: External ear normal.  Mouth/Throat: No oropharyngeal exudate.  Patient on BiPAP.  Eyes: EOM are normal. Pupils are equal, round, and reactive to light. No scleral icterus.  Neck: Normal range of motion. Neck supple. No JVD present. No thyromegaly present.  Cardiovascular: Normal rate, regular rhythm, normal heart sounds and intact distal pulses.  Exam reveals no friction  rub.   No murmur heard. Respiratory: Effort normal. No respiratory distress. She has no wheezes. She has rales. She exhibits no tenderness.  GI: Soft. Bowel sounds are normal. She exhibits no distension and no mass. There is no tenderness. There is no rebound and no guarding.  Musculoskeletal: Normal range of motion. She exhibits no edema.  Lymphadenopathy:    She has no cervical adenopathy.  Neurological: She is alert and oriented to person, place, and time. She has normal reflexes. She displays normal reflexes. No cranial nerve deficit. She exhibits normal muscle tone.  Skin: Skin is warm. No rash noted. No erythema.  Psychiatric: She has a normal mood and affect. Her behavior is normal. Thought content normal.   LABORATORY PANEL:   CBC No results for input(s): WBC, HGB, HCT, PLT in the last 168 hours. ------------------------------------------------------------------------------------------------------------------  Chemistries   Recent Labs Lab 12/04/14 2137  AST 37  ALT 14  ALKPHOS 49  BILITOT 0.7   ------------------------------------------------------------------------------------------------------------------  Cardiac Enzymes  Recent Labs Lab 12/04/14 2137  TROPONINI 0.04*   ------------------------------------------------------------------------------------------------------------------  RADIOLOGY:  Dg Chest 1 View  12/04/2014   CLINICAL DATA:  Difficulty breathing with hypoxia  EXAM: CHEST  1 VIEW  COMPARISON:  06/12/2014  FINDINGS: Cardiac shadow is within normal limits. Diffuse infiltrative density is noted projecting in the right lower lobe. Minimal left basilar changes are noted as well. Mild vascular congestion and interstitial edema is noted.  IMPRESSION: Bilateral lower lobe infiltrates superimposed over vascular congestion.   Electronically Signed   By: Inez Catalina M.D.   On: 12/04/2014 21:41    EKG:   Orders placed or performed during the hospital  encounter of 12/04/14  . EKG 12-Lead normal sinus rhythm with ventricular rate of 90 bpm, ST-T wave changes in lateral leads.   . EKG 12-Lead    IMPRESSION AND PLAN:   1. Acute respiratory failure with hypoxia secondary to bilateral pneumonia on top of COPD. Plan: Admit to CCU, continue BiPAP, IV antibiotics Zosyn and vancomycin, DuoNeb nebs when necessary, IV Solu-Medrol, follow-up O2 sats, follow blood cultures, CBC and BMP which are pending at this time. 2. Bilateral pneumonia causing acute respiratory failure. Plan: As above. 3. Septic shock secondary to bilateral pneumonia, received IV fluid boluses, now on maintenance IV fluids. Monitor BP closely, consider pressors if needed. Follow-up lactic acid levels. Follow-up blood cultures, CBC. 4. COPD on ipratropium, when necessary O2 at home. Continue DuoNeb's, O2 supplementation, IV Solu-Medrol, follow-up O2 sats. 5. Mild elevated troponin, likely secondary to demand ischemia. No history of prior CAD. Cycle cardiac enzymes, continue aspirin. Order echocardiogram, consider further workup including cardiology evaluation if needed.  6. History of CHF. Check echocardiogram, follow-up accordingly. 7. History of hypertension, BP now low. Hold antihypertensives for now, monitor BP closely. 8. Carcinoid tumor of stomach, status post gastrectomy, patient under care of oncology. No acute problems now.    All the records are reviewed and case discussed with ED provider. Management plans discussed with the patient, family and they are in agreement.  CODE STATUS: Full code  TOTAL TIME TAKING CARE OF THIS PATIENT: 50 minutes.    Azucena Freed N M.D on 12/05/2014 at 2:01 AM  Between 7am to 6pm - Pager - [phone]  After 6pm go to [email or web address] - password [email or web address] [facility]  [facility]  Office  [phone]  CC: Primary care physician; Adin Hector, MD

## 2014-12-05 NOTE — Progress Notes (Signed)
Spoke with Dr. Saralyn Pilar and Dr. Sabra Heck about Pt's new onset of A Fib with HR 110-130's. Orders for .125mg  Digoxin IV q2hr x4 doses were given. Will continue to monitor

## 2014-12-06 DIAGNOSIS — E440 Moderate protein-calorie malnutrition: Secondary | ICD-10-CM | POA: Diagnosis present

## 2014-12-06 LAB — CBC WITH DIFFERENTIAL/PLATELET
BASOS ABS: 0 10*3/uL (ref 0–0.1)
Basophils Relative: 0 %
Eosinophils Absolute: 0 10*3/uL (ref 0–0.7)
Eosinophils Relative: 0 %
HCT: 38.7 % (ref 35.0–47.0)
HEMOGLOBIN: 13 g/dL (ref 12.0–16.0)
LYMPHS ABS: 0.5 10*3/uL — AB (ref 1.0–3.6)
Lymphocytes Relative: 3 %
MCH: 28.5 pg (ref 26.0–34.0)
MCHC: 33.5 g/dL (ref 32.0–36.0)
MCV: 85 fL (ref 80.0–100.0)
MONO ABS: 0 10*3/uL — AB (ref 0.2–0.9)
MONOS PCT: 0 %
Neutro Abs: 16.4 10*3/uL — ABNORMAL HIGH (ref 1.4–6.5)
Neutrophils Relative %: 97 %
Platelets: 136 10*3/uL — ABNORMAL LOW (ref 150–440)
RBC: 4.56 MIL/uL (ref 3.80–5.20)
RDW: 15.2 % — ABNORMAL HIGH (ref 11.5–14.5)
WBC: 17 10*3/uL — ABNORMAL HIGH (ref 3.6–11.0)

## 2014-12-06 LAB — BASIC METABOLIC PANEL
ANION GAP: 8 (ref 5–15)
Anion gap: 5 (ref 5–15)
BUN: 46 mg/dL — ABNORMAL HIGH (ref 6–20)
BUN: 42 mg/dL — ABNORMAL HIGH (ref 6–20)
CHLORIDE: 114 mmol/L — AB (ref 101–111)
CHLORIDE: 107 mmol/L (ref 101–111)
CO2: 21 mmol/L — ABNORMAL LOW (ref 22–32)
CO2: 25 mmol/L (ref 22–32)
CREATININE: 1.36 mg/dL — AB (ref 0.44–1.00)
Calcium: 8.8 mg/dL — ABNORMAL LOW (ref 8.9–10.3)
Calcium: 8.9 mg/dL (ref 8.9–10.3)
Creatinine, Ser: 1.41 mg/dL — ABNORMAL HIGH (ref 0.44–1.00)
GFR calc Af Amer: 43 mL/min — ABNORMAL LOW (ref >60)
GFR calc non Af Amer: 35 mL/min — ABNORMAL LOW (ref >60)
GFR, EST AFRICAN AMERICAN: 41 mL/min — AB (ref >60)
GFR, EST NON AFRICAN AMERICAN: 37 mL/min — AB (ref >60)
GLUCOSE: 135 mg/dL — AB (ref 65–99)
Glucose, Bld: 110 mg/dL — ABNORMAL HIGH (ref 65–99)
POTASSIUM: 3.6 mmol/L (ref 3.5–5.1)
Potassium: 3.8 mmol/L (ref 3.5–5.1)
SODIUM: 140 mmol/L (ref 135–145)
Sodium: 140 mmol/L (ref 135–145)

## 2014-12-06 LAB — URINE CULTURE: Culture: NO GROWTH

## 2014-12-06 LAB — C DIFFICILE QUICK SCREEN W PCR REFLEX
C DIFFICILE (CDIFF) INTERP: NEGATIVE
C DIFFICILE (CDIFF) TOXIN: NEGATIVE
C Diff antigen: NEGATIVE

## 2014-12-06 MED ORDER — DIGOXIN 125 MCG PO TABS
0.1250 mg | ORAL_TABLET | Freq: Every day | ORAL | Status: DC
  Administered 2014-12-07 – 2014-12-08 (×2): 0.125 mg via ORAL
  Filled 2014-12-06 (×2): qty 1

## 2014-12-06 MED ORDER — VANCOMYCIN HCL 500 MG IV SOLR
500.0000 mg | INTRAVENOUS | Status: DC
  Administered 2014-12-06 – 2014-12-08 (×2): 500 mg via INTRAVENOUS
  Filled 2014-12-06 (×2): qty 500

## 2014-12-06 MED ORDER — LOPERAMIDE HCL 2 MG PO CAPS
2.0000 mg | ORAL_CAPSULE | ORAL | Status: DC | PRN
  Administered 2014-12-06 – 2014-12-15 (×14): 2 mg via ORAL
  Filled 2014-12-06 (×16): qty 1

## 2014-12-06 MED ORDER — METHYLPREDNISOLONE SODIUM SUCC 40 MG IJ SOLR
40.0000 mg | Freq: Two times a day (BID) | INTRAMUSCULAR | Status: DC
  Administered 2014-12-06 – 2014-12-08 (×6): 40 mg via INTRAVENOUS
  Filled 2014-12-06 (×6): qty 1

## 2014-12-06 MED ORDER — CEFTRIAXONE SODIUM IN DEXTROSE 20 MG/ML IV SOLN
1.0000 g | Freq: Two times a day (BID) | INTRAVENOUS | Status: DC
  Administered 2014-12-06 – 2014-12-08 (×5): 1 g via INTRAVENOUS
  Filled 2014-12-06 (×7): qty 50

## 2014-12-06 MED ORDER — FUROSEMIDE 10 MG/ML IJ SOLN
20.0000 mg | Freq: Two times a day (BID) | INTRAMUSCULAR | Status: DC
  Administered 2014-12-06 – 2014-12-08 (×5): 20 mg via INTRAVENOUS
  Filled 2014-12-06 (×5): qty 2

## 2014-12-06 MED ORDER — POTASSIUM CHLORIDE CRYS ER 20 MEQ PO TBCR
20.0000 meq | EXTENDED_RELEASE_TABLET | Freq: Two times a day (BID) | ORAL | Status: DC
Start: 2014-12-06 — End: 2014-12-07
  Administered 2014-12-06 (×2): 20 meq via ORAL
  Filled 2014-12-06 (×2): qty 1

## 2014-12-06 NOTE — Progress Notes (Addendum)
Patient ID: Veronica Wang, female   DOB: Jan 29, 1936, 79 y.o.   MRN: [id number] Veronica Wang is a 79 y.o. female   SUBJECTIVE:  Patient admitted with septic shock and bilateral lower pneumonia. Baseline systolic pressure patient reports is in the low 80s. Patient on high flow to this morning, EF 40% per echo,known congestive heart failure. No complaints  ______________________________________________________________________  ROS: Review of systems is unremarkable for any active cardiac,respiratory, GI, GU, hematologic, neurologic or psychiatric systems, 10 systems reviewed.  Marland Kitchen antiseptic oral rinse  7 mL Mouth Rinse q12n4p  . antiseptic oral rinse  7 mL Mouth Rinse BID  . aspirin EC  81 mg Oral Daily  . azithromycin  250 mg Oral Daily  . chlorhexidine  15 mL Mouth Rinse BID  . cholecalciferol  2,000 Units Oral Daily  . digoxin  0.125 mg Oral Daily  . feeding supplement (ENSURE ENLIVE)  237 mL Oral TID WC  . furosemide  20 mg Intravenous Q12H  . gabapentin  1,200 mg Oral TID  . heparin  5,000 Units Subcutaneous 3 times per day  . ipratropium  0.5 mg Nebulization Q6H  . methylPREDNISolone (SOLU-MEDROL) injection  40 mg Intravenous Q12H  . multivitamin with minerals  1 tablet Oral Daily  . piperacillin-tazobactam (ZOSYN)  IV  3.375 g Intravenous Q12H  . potassium chloride  20 mEq Oral BID  . [START ON 12/07/2014] vancomycin  500 mg Intravenous Q48H   acetaminophen **OR** acetaminophen, ondansetron **OR** ondansetron (ZOFRAN) IV   Past Medical History  Diagnosis Date  . Hypertension   . GERD (gastroesophageal reflux disease)   . Cancer     Liver  . Cancer     stomach  . FH: chemotherapy   . Radiation   . COPD (chronic obstructive pulmonary disease)   . CHF (congestive heart failure)     Past Surgical History  Procedure Laterality Date  . Colon resection    . Cataract extraction  splenectomy  . Colon surgery      PHYSICAL EXAM:  BP 83/74 mmHg  Pulse 98  Temp(Src)  98.5 F (36.9 C) (Oral)  Resp 20  Ht 5\' 3"  (1.6 m)  Wt 36 kg (79 lb 5.9 oz)  BMI 14.06 kg/m2  SpO2 89%  Wt Readings from Last 3 Encounters:  12/06/14 36 kg (79 lb 5.9 oz)  06/26/14 37.649 kg (83 lb)           BP Readings from Last 3 Encounters:  12/06/14 83/74  11/20/14 126/73  06/26/14 148/79    Constitutional: NAD Neck: supple, no thyromegaly Respiratory: Bilateral rhonchi, wheezing somewhat improved  Cardiovascular: RRR, no murmur, no gallop Abdomen: soft, good BS, nontender Extremities: no edema Neuro: alert and oriented, no focal motor or sensory deficits  ASSESSMENT/PLAN:  Labs and imaging studies were reviewed  Septic shock/bilateral pneumonia/acute respiratory failure-on IV Zosyn/vancomycin, by mouth azithromycin, on home O2, now 5 L, wean as able, reduce Solu-Medrol, chest x-ray in a.m.  COPD-chronic, continue Spiriva CHF-IV Lasix with slight volume overload CKD-creatinine better at 1.4 A. fib-loaded with IV Lanoxin, pressure stable, by mouth dig, check level a.m. To stepdown

## 2014-12-06 NOTE — Progress Notes (Signed)
ANTIBIOTIC CONSULT NOTE - Follow up  Pharmacy Consult for Vancomycin  Indication: pneumonia  Allergies  Allergen Reactions  . No Known Allergies     Patient Measurements: Height: 5\' 3"  (160 cm) Weight: 79 lb 5.9 oz (36 kg) IBW/kg (Calculated) : 52.4 Adjusted Body Weight: n/a  Vital Signs: Temp: 98.5 F (36.9 C) (05/29 0346) Temp Source: Oral (05/29 0346) BP: 111/71 mmHg (05/29 1000) Pulse Rate: 87 (05/29 1000) Intake/Output from previous day: 05/28 0701 - 05/29 0700 In: 750 [P.O.:600; IV Piggyback:150] Out: 650 [Urine:650] Intake/Output from this shift: Total I/O In: 290 [P.O.:240; IV Piggyback:50] Out: -   Labs:  Recent Labs  12/04/14 2115 12/05/14 0456 12/06/14 0433  WBC 10.0 7.5 17.0*  HGB 16.4* 15.1 13.0  PLT 151 127* 136*  CREATININE 2.65* 2.12* 1.41*   Estimated Creatinine Clearance: 19.3 mL/min (by C-G formula based on Cr of 1.41).    Microbiology: Recent Results (from the past 720 hour(s))  Culture, blood (routine x 2)     Status: None (Preliminary result)   Collection Time: 12/04/14  9:35 PM  Result Value Ref Range Status   Specimen Description BLOOD  Final   Special Requests Normal  Final   Culture NO GROWTH 2 DAYS  Final   Report Status PENDING  Incomplete  Blood culture (routine x 2)     Status: None (Preliminary result)   Collection Time: 12/04/14  9:35 PM  Result Value Ref Range Status   Specimen Description BLOOD  Final   Special Requests Normal  Final   Culture NO GROWTH 1 DAY  Final   Report Status PENDING  Incomplete  Urine culture     Status: None   Collection Time: 12/04/14 10:30 PM  Result Value Ref Range Status   Specimen Description URINE, CLEAN CATCH  Final   Special Requests NONE  Final   Culture NO GROWTH 2 DAYS  Final   Report Status 12/06/2014 FINAL  Final  MRSA PCR Screening     Status: None   Collection Time: 12/05/14  9:34 AM  Result Value Ref Range Status   MRSA by PCR NEGATIVE NEGATIVE Final    Comment:         The GeneXpert MRSA Assay (FDA approved for NASAL specimens only), is one component of a comprehensive MRSA colonization surveillance program. It is not intended to diagnose MRSA infection nor to guide or monitor treatment for MRSA infections.     Medical History: Past Medical History  Diagnosis Date  . Hypertension   . GERD (gastroesophageal reflux disease)   . Cancer     Liver  . Cancer     stomach  . FH: chemotherapy   . Radiation   . COPD (chronic obstructive pulmonary disease)   . CHF (congestive heart failure)     Medications:  Anti-infectives    Start     Dose/Rate Route Frequency Ordered Stop   12/07/14 1800  vancomycin (VANCOCIN) 500 mg in sodium chloride 0.9 % 100 mL IVPB  Status:  Discontinued     500 mg 100 mL/hr over 60 Minutes Intravenous Every 48 hours 12/05/14 0327 12/06/14 1424   12/06/14 2200  vancomycin (VANCOCIN) 500 mg in dextrose 5 % 100 mL IVPB     500 mg 100 mL/hr over 60 Minutes Intravenous Every 36 hours 12/06/14 1424     12/06/14 1230  cefTRIAXone (ROCEPHIN) 1 g in dextrose 5 % 50 mL IVPB - Premix     1 g 100 mL/hr over  30 Minutes Intravenous Every 12 hours 12/06/14 1158     12/06/14 1000  azithromycin (ZITHROMAX) tablet 250 mg     250 mg Oral Daily 12/05/14 0839 12/10/14 0959   12/05/14 1000  piperacillin-tazobactam (ZOSYN) IVPB 3.375 g  Status:  Discontinued     3.375 g 12.5 mL/hr over 240 Minutes Intravenous Every 12 hours 12/05/14 0327 12/06/14 1158   12/05/14 1000  azithromycin (ZITHROMAX) tablet 500 mg     500 mg Oral Daily 12/05/14 0839 12/05/14 0957   12/04/14 2145  piperacillin-tazobactam (ZOSYN) IVPB 3.375 g     3.375 g 100 mL/hr over 30 Minutes Intravenous  Once 12/04/14 2131 12/04/14 2239   12/04/14 2145  vancomycin (VANCOCIN) IVPB 1000 mg/200 mL premix     1,000 mg 200 mL/hr over 60 Minutes Intravenous  Once 12/04/14 2131 12/04/14 2309     Assessment: Patient admitted for pneumonia and initiated on Zosyn and Vancomycin.  Received Vancomycin 1g and Zosyn 3.375g IV in the ED 5/27 ~22:00. Zosyn discontinued 5/29. Patient also on Azithromycin PO and ceftriaxone.  5/27: Ke=0.014, t1/2=49.5hr, Vd=27.7L  Goal of Therapy:  Vancomycin trough level 15-20 mcg/ml  Plan:  Vancomycin 500mg  IV q48h, will start 72hr after initial 1g dose.   5/29: Scr improved. Will transition Vancomycin to 500mg  IV Q36h.  Ke 0.02  t1/2 34.6  Vd 25. Will order trough for 5/31 at 0930 (not at steady state).   Chinita Greenland PharmD Clinical Pharmacist 12/06/2014

## 2014-12-07 ENCOUNTER — Inpatient Hospital Stay: Payer: Medicare Other

## 2014-12-07 LAB — CBC WITH DIFFERENTIAL/PLATELET
BAND NEUTROPHILS: 3 % (ref 0–10)
Basophils Absolute: 0 10*3/uL (ref 0.0–0.1)
Basophils Relative: 0 % (ref 0–1)
Blasts: 0 %
EOS PCT: 0 % (ref 0–5)
Eosinophils Absolute: 0 10*3/uL (ref 0.0–0.7)
HCT: 42.6 % (ref 35.0–47.0)
Hemoglobin: 14.2 g/dL (ref 12.0–16.0)
Lymphocytes Relative: 5 % — ABNORMAL LOW (ref 12–46)
Lymphs Abs: 1.1 10*3/uL (ref 0.7–4.0)
MCH: 27.9 pg (ref 26.0–34.0)
MCHC: 33.2 g/dL (ref 32.0–36.0)
MCV: 84.1 fL (ref 80.0–100.0)
MONOS PCT: 2 % — AB (ref 3–12)
MYELOCYTES: 0 %
Metamyelocytes Relative: 0 %
Monocytes Absolute: 0.4 10*3/uL (ref 0.1–1.0)
NEUTROS PCT: 90 % — AB (ref 43–77)
Neutro Abs: 19.6 10*3/uL — ABNORMAL HIGH (ref 1.7–7.7)
Other: 0 %
PLATELETS: 144 10*3/uL — AB (ref 150–440)
Promyelocytes Absolute: 0 %
RBC: 5.07 MIL/uL (ref 3.80–5.20)
RDW: 14.8 % — ABNORMAL HIGH (ref 11.5–14.5)
WBC: 21.1 10*3/uL — ABNORMAL HIGH (ref 3.6–11.0)
nRBC: 0 /100 WBC

## 2014-12-07 LAB — BASIC METABOLIC PANEL
ANION GAP: 8 (ref 5–15)
BUN: 47 mg/dL — ABNORMAL HIGH (ref 6–20)
CO2: 26 mmol/L (ref 22–32)
CREATININE: 1.06 mg/dL — AB (ref 0.44–1.00)
Calcium: 9.3 mg/dL (ref 8.9–10.3)
Chloride: 108 mmol/L (ref 101–111)
GFR calc Af Amer: 58 mL/min — ABNORMAL LOW (ref >60)
GFR, EST NON AFRICAN AMERICAN: 50 mL/min — AB (ref >60)
GLUCOSE: 116 mg/dL — AB (ref 65–99)
Potassium: 4.5 mmol/L (ref 3.5–5.1)
Sodium: 142 mmol/L (ref 135–145)

## 2014-12-07 LAB — DIGOXIN LEVEL: Digoxin Level: 0.8 ng/mL (ref 0.8–2.0)

## 2014-12-07 MED ORDER — CETYLPYRIDINIUM CHLORIDE 0.05 % MT LIQD
7.0000 mL | Freq: Two times a day (BID) | OROMUCOSAL | Status: DC
  Administered 2014-12-07 – 2014-12-15 (×14): 7 mL via OROMUCOSAL

## 2014-12-07 MED ORDER — POTASSIUM CHLORIDE CRYS ER 20 MEQ PO TBCR
20.0000 meq | EXTENDED_RELEASE_TABLET | Freq: Every day | ORAL | Status: DC
  Administered 2014-12-07: 20 meq via ORAL
  Filled 2014-12-07: qty 1

## 2014-12-07 MED ORDER — DEXTROSE 5 % IV SOLN
500.0000 mg | INTRAVENOUS | Status: DC
  Administered 2014-12-07 – 2014-12-09 (×3): 500 mg via INTRAVENOUS
  Filled 2014-12-07 (×5): qty 500

## 2014-12-07 MED ORDER — PANTOPRAZOLE SODIUM 40 MG PO TBEC
40.0000 mg | DELAYED_RELEASE_TABLET | Freq: Every day | ORAL | Status: DC
Start: 2014-12-07 — End: 2014-12-15
  Administered 2014-12-07 – 2014-12-15 (×9): 40 mg via ORAL
  Filled 2014-12-07 (×9): qty 1

## 2014-12-07 NOTE — Progress Notes (Signed)
ANTIBIOTIC CONSULT NOTE - Follow up  Pharmacy Consult for Vancomycin  Indication: pneumonia  Allergies  Allergen Reactions   No Known Allergies     Patient Measurements: Height: 5\' 3"  (160 cm) Weight: 79 lb 5.9 oz (36 kg) IBW/kg (Calculated) : 52.4 Adjusted Body Weight: n/a  Vital Signs: Temp: 97.5 F (36.4 C) (05/30 0400) BP: 108/77 mmHg (05/30 0700) Pulse Rate: 59 (05/30 0700) Intake/Output from previous day: 05/29 0701 - 05/30 0700 In: 1290 [P.O.:1040; IV Piggyback:250] Out: 3000 [Urine:2700; Stool:300] Intake/Output from this shift: Total I/O In: 200 [P.O.:200] Out: -   Labs:  Recent Labs  12/05/14 0456 12/06/14 0433 12/06/14 1601 12/07/14 0411  WBC 7.5 17.0*  --  21.1*  HGB 15.1 13.0  --  14.2  PLT 127* 136*  --  144*  CREATININE 2.12* 1.41* 1.36* 1.06*   Estimated Creatinine Clearance: 25.7 mL/min (by C-G formula based on Cr of 1.06).    Microbiology: Recent Results (from the past 720 hour(s))  Culture, blood (routine x 2)     Status: None (Preliminary result)   Collection Time: 12/04/14  9:35 PM  Result Value Ref Range Status   Specimen Description BLOOD  Final   Special Requests Normal  Final   Culture NO GROWTH 3 DAYS  Final   Report Status PENDING  Incomplete  Blood culture (routine x 2)     Status: None (Preliminary result)   Collection Time: 12/04/14  9:35 PM  Result Value Ref Range Status   Specimen Description BLOOD  Final   Special Requests Normal  Final   Culture NO GROWTH 2 DAYS  Final   Report Status PENDING  Incomplete  Urine culture     Status: None   Collection Time: 12/04/14 10:30 PM  Result Value Ref Range Status   Specimen Description URINE, CLEAN CATCH  Final   Special Requests NONE  Final   Culture NO GROWTH 2 DAYS  Final   Report Status 12/06/2014 FINAL  Final  MRSA PCR Screening     Status: None   Collection Time: 12/05/14  9:34 AM  Result Value Ref Range Status   MRSA by PCR NEGATIVE NEGATIVE Final    Comment:         The GeneXpert MRSA Assay (FDA approved for NASAL specimens only), is one component of a comprehensive MRSA colonization surveillance program. It is not intended to diagnose MRSA infection nor to guide or monitor treatment for MRSA infections.   C difficile quick scan w PCR reflex [facility])     Status: None   Collection Time: 12/06/14  4:02 PM  Result Value Ref Range Status   C Diff antigen NEGATIVE  Final   C Diff toxin NEGATIVE  Final   C Diff interpretation Negative for C. difficile  Final    Medical History: Past Medical History  Diagnosis Date   Hypertension    GERD (gastroesophageal reflux disease)    Cancer     Liver   Cancer     stomach   FH: chemotherapy    Radiation    COPD (chronic obstructive pulmonary disease)    CHF (congestive heart failure)     Medications:  Anti-infectives    Start     Dose/Rate Route Frequency Ordered Stop   12/07/14 1800  vancomycin (VANCOCIN) 500 mg in sodium chloride 0.9 % 100 mL IVPB  Status:  Discontinued     500 mg 100 mL/hr over 60 Minutes Intravenous Every 48 hours 12/05/14 0327 12/06/14 1424  12/07/14 1000  azithromycin (ZITHROMAX) 500 mg in dextrose 5 % 250 mL IVPB     500 mg 250 mL/hr over 60 Minutes Intravenous Every 24 hours 12/07/14 0838     12/06/14 2200  vancomycin (VANCOCIN) 500 mg in dextrose 5 % 100 mL IVPB     500 mg 100 mL/hr over 60 Minutes Intravenous Every 36 hours 12/06/14 1424     12/06/14 1230  cefTRIAXone (ROCEPHIN) 1 g in dextrose 5 % 50 mL IVPB - Premix     1 g 100 mL/hr over 30 Minutes Intravenous Every 12 hours 12/06/14 1158     12/06/14 1000  azithromycin (ZITHROMAX) tablet 250 mg  Status:  Discontinued     250 mg Oral Daily 12/05/14 0839 12/07/14 0838   12/05/14 1000  piperacillin-tazobactam (ZOSYN) IVPB 3.375 g  Status:  Discontinued     3.375 g 12.5 mL/hr over 240 Minutes Intravenous Every 12 hours 12/05/14 0327 12/06/14 1158   12/05/14 1000  azithromycin (ZITHROMAX) tablet 500 mg      500 mg Oral Daily 12/05/14 0839 12/05/14 0957   12/04/14 2145  piperacillin-tazobactam (ZOSYN) IVPB 3.375 g     3.375 g 100 mL/hr over 30 Minutes Intravenous  Once 12/04/14 2131 12/04/14 2239   12/04/14 2145  vancomycin (VANCOCIN) IVPB 1000 mg/200 mL premix     1,000 mg 200 mL/hr over 60 Minutes Intravenous  Once 12/04/14 2131 12/04/14 2309     Assessment: Patient admitted for pneumonia, currently on vancomycin, ceftriaxone and azithromycin. Renal function has improved significantly since admission, baseline from 06/2014 appears to be ~0.8. Vancomycin transitioned to 500mg  IV Q36h from q48h 5/29. Renal function improved again overnight, but appears that 500 mg IV q36h is still appropriate.   5/27: Ke=0.014, t1/2=49.5hr, Vd=27.7L  Goal of Therapy:  Vancomycin trough level 15-20 mcg/ml  Plan:  Will not change dose at this time and leave trough ordered for 5/31 @ 0930.   Abbott Pao 12/07/2014

## 2014-12-07 NOTE — Care Management Note (Signed)
Case Management Note  Patient Details  Name: Veronica Wang MRN: [id number] Date of Birth: Apr 18, 1936  Subjective/Objective:   Admitted to ICU with respiratory failure secondary to bilateral PNA and COPD. Presents from home where she lives alone.  Prior to admission, pt was independent with adls, driving and picked up her grandchildren from school. She required no DME. Patient on chronic O2 @ 2L PRN through [facility].  Her daughter is her support system and helps as needed. Currently on 5L HFNC, IV antibiotics and IV lasix. She is not currently followed by any home health but has been active with [facility] in the past.   Action/Plan:   Expected Discharge Date:                  Expected Discharge Plan:  Home/Self Care  In-House Referral:     Discharge planning Services  CM Consult  Post Acute Care Choice:    Choice offered to:     DME Arranged:    DME Agency:     HH Arranged:    [place] Agency:     Status of Service:  In process, will continue to follow  Medicare Important Message Given:    Date Medicare IM Given:    Medicare IM give by:    Date Additional Medicare IM Given:    Additional Medicare Important Message give by:     If discussed at [place] of Stay Meetings, dates discussed:    Additional Comments:  Jolly Mango, RN 12/07/2014, 12:01 PM

## 2014-12-07 NOTE — Progress Notes (Signed)
Date: 12/07/2014,   MRN# [id number] Veronica Wang 12-Mar-1936 Code Status:     Code Status Orders        Start     Ordered   12/05/14 0341  Full code   Continuous     12/05/14 0340     Hosp day:@LENGTHOFSTAYDAYS @ Referring MD: @ATDPROV @     PCP: Kerrin Mo       CC: SOB, HIF FIO2  HPI: This is a 79 y o female, here with sob, bilateral infiltrates, pulm edema, (ef 45-50%) copd, shunt,  Hypoxia. She is on appropriate coverage. Lesle Chris. Hypotension resolved. No pleurisy, calf pain or advancing edema  PMHX:   Past Medical History  Diagnosis Date  . Hypertension   . GERD (gastroesophageal reflux disease)   . Cancer     Liver  . Cancer     stomach  . FH: chemotherapy   . Radiation   . COPD (chronic obstructive pulmonary disease)   . CHF (congestive heart failure)    Surgical Hx:  Past Surgical History  Procedure Laterality Date  . Colon resection    . Cataract extraction  splenectomy  . Colon surgery     Family Hx:  Family History  Problem Relation Age of Onset  . Heart attack Sister   . Heart attack Brother    Social Hx:   History  Substance Use Topics  . Smoking status: Never Smoker   . Smokeless tobacco: Not on file  . Alcohol Use: No   Medication:    Home Medication:  No current outpatient prescriptions on file.  Current Medication: @CURMEDTAB @   Allergies:  No known allergies  Review of Systems: Gen:  Denies  fever, sweats, chills HEENT: Denies blurred vision, double vision, ear pain, eye pain, hearing loss, nose bleeds, sore throat Cvc:  No dizziness, chest pain or heaviness Resp:  On hfnc, sob with min activity, no hemoptysis, wheezing Gi: Denies swallowing difficulty, stomach pain, nausea or vomiting, diarrhea, constipation, bowel incontinence Gu:  Denies bladder incontinence, burning urine Ext:   No Joint pain, stiffness or swelling Skin: No skin rash, easy bruising or bleeding or hives Endoc:  No polyuria, polydipsia , polyphagia or  weight change Psych: No depression, insomnia or hallucinations  Other:  All other systems negative  Physical Examination:   VS: BP 131/81 mmHg  Pulse 72  Temp(Src) 97.8 F (36.6 C) (Oral)  Resp 20  Ht 5\' 3"  (1.6 m)  Wt 79 lb 5.9 oz (36 kg)  BMI 14.06 kg/m2  SpO2 92%  General Appearance: No distress  Neuro/psych: mental status, speech normal, alert and oriented, cranial nerves 2-12 intact, reflexes normal and symmetric, sensation grossly normal  HEENT: PERRLA, EOM intact, no ptosis, no other lesions noticed Pulmonary:No wheezing, No rales  Coarse bs   Cardiovascular:  Normal S1,S2.  No m/r/g.  Abdominal aorta pulsation normal.    Abdomen:Benign, Soft, non-tender, No masses, hepatosplenomegaly, No lymphadenopathy Endoc: No evident thyromegaly, no signs of acromegaly or Cushing features Skin:   warm, no rashes, no ecchymosis  Extremities: normal, no cyanosis, clubbing, no edema, warm with normal capillary refill. Other findings:   Labs results:   Recent Labs     12/04/14  2115  12/04/14  2137  12/05/14  0456  12/06/14  0433  12/06/14  1601  12/07/14  0411  HGB  16.4*   --   15.1  13.0   --   14.2  HCT  50.9*   --   46.1  38.7   --   42.6  MCV  86.4   --   86.9  85.0   --   84.1  WBC  10.0   --   7.5  17.0*   --   21.1*  BUN  39*   --   39*  46*  42*  47*  CREATININE  2.65*   --   2.12*  1.41*  1.36*  1.06*  GLUCOSE  56*   --   77  110*  135*  116*  CALCIUM  8.5*   --   7.4*  8.8*  8.9  9.3  INR   --   1.36   --    --    --    --   ,    Dg Chest Port 1 View  12/07/2014   CLINICAL DATA:  Bilateral pneumonia and septic shock  EXAM: PORTABLE CHEST - 1 VIEW  COMPARISON:  12/04/2014  FINDINGS: Normal heart size. There are bilateral pleural effusions and mild interstitial edema consistent with CHF. Right midlung and right base airspace disease is identified consistent with pneumonia.  IMPRESSION: 1. CHF with superimposed right lung pneumonia.   Electronically Signed   By:  Kerby Moors M.D.   On: 12/07/2014 10:39   Study Conclusions, ECHO  - Left ventricle: Systolic function was mildly reduced. The estimated ejection fraction was in the range of 45% to 50%. Left ventricular diastolic function parameters were normal. - Mitral valve: There was moderate regurgitation. - Right atrium: The atrium was mildly dilated.   Assessment and Plan: This is a thin, pleasant lady, here with bilateral pneumonia, on triple antibiotics, ? Superimpose pulmonary edema, copd, improving renal failure. Unfortunately having a difficulty weaning fio2.    Plan Continue antibiotics, duo nebs, diuretics Wean fio2 as tolerated dvt prophylaxis Serial cxr reassess in the am.    I have personally obtained a history, examined the patient, evaluated laboratory and imaging results, formulated the assessment and plan and placed orders.  The Patient requires high complexity decision making for assessment and support, frequent evaluation and titration of therapies, application of advanced monitoring technologies and extensive interpretation of multiple databases.   Shanequia Kendrick,M.D. Pulmonary & Critical care Medicine [facility]

## 2014-12-07 NOTE — Progress Notes (Signed)
Patient ID: Veronica Wang, female   DOB: 1936/01/31, 79 y.o.   MRN: [id number] Patient ID: Veronica Wang, female   DOB: 1935/09/02, 79 y.o.   MRN: [id number] Kayla Deshaies is a 79 y.o. female   SUBJECTIVE:  Patient admitted with septic shock and bilateral lower pneumonia. Baseline systolic pressure patient reports is in the low 80s. Patient on high flow to this morning, EF 40% per echo,known congestive heart failure. Patient reports that she feels better although still remaining on high flow O2  ______________________________________________________________________  ROS: Review of systems is unremarkable for any active cardiac,respiratory, GI, GU, hematologic, neurologic or psychiatric systems, 10 systems reviewed.  Marland Kitchen antiseptic oral rinse  7 mL Mouth Rinse q12n4p  . antiseptic oral rinse  7 mL Mouth Rinse BID  . aspirin EC  81 mg Oral Daily  . azithromycin  500 mg Intravenous Q24H  . cefTRIAXone (ROCEPHIN)  IV  1 g Intravenous Q12H  . chlorhexidine  15 mL Mouth Rinse BID  . cholecalciferol  2,000 Units Oral Daily  . digoxin  0.125 mg Oral Daily  . feeding supplement (ENSURE ENLIVE)  237 mL Oral TID WC  . furosemide  20 mg Intravenous Q12H  . gabapentin  1,200 mg Oral TID  . heparin  5,000 Units Subcutaneous 3 times per day  . ipratropium  0.5 mg Nebulization Q6H  . methylPREDNISolone (SOLU-MEDROL) injection  40 mg Intravenous Q12H  . multivitamin with minerals  1 tablet Oral Daily  . pantoprazole  40 mg Oral Daily  . potassium chloride  20 mEq Oral Daily  . vancomycin (VANCOCIN) 500 mg IVPB  500 mg Intravenous Q36H   acetaminophen **OR** acetaminophen, loperamide, ondansetron **OR** ondansetron (ZOFRAN) IV   Past Medical History  Diagnosis Date  . Hypertension   . GERD (gastroesophageal reflux disease)   . Cancer     Liver  . Cancer     stomach  . FH: chemotherapy   . Radiation   . COPD (chronic obstructive pulmonary disease)   . CHF (congestive heart failure)      Past Surgical History  Procedure Laterality Date  . Colon resection    . Cataract extraction  splenectomy  . Colon surgery      PHYSICAL EXAM:  BP 108/77 mmHg  Pulse 59  Temp(Src) 97.5 F (36.4 C) (Oral)  Resp 18  Ht 5\' 3"  (1.6 m)  Wt 36 kg (79 lb 5.9 oz)  BMI 14.06 kg/m2  SpO2 89%  Wt Readings from Last 3 Encounters:  12/06/14 36 kg (79 lb 5.9 oz)  06/26/14 37.649 kg (83 lb)           BP Readings from Last 3 Encounters:  12/07/14 108/77  11/20/14 126/73  06/26/14 148/79    Constitutional: NAD Neck: supple, no thyromegaly Respiratory: Bilateral rhonchi, wheezing somewhat improved  Cardiovascular: RRR, no murmur, no gallop Abdomen: soft, good BS, mild epigastric tenderness, no rebound Extremities: no edema Neuro: alert and oriented, no focal motor or sensory deficits  ASSESSMENT/PLAN:  Labs and imaging studies were reviewed  Septic shock/bilateral pneumonia/acute respiratory failure-on IV rocephin/vancomycin/azith, on home O2, now HFNC, wean as able, Solu-Medrol, chest x-ray today  COPD-chronic, continue Spiriva CHF-IV Lasix, follow low creatinine, no significant improvement in respiratory status with diuresis CKD-creatinine better at 1.4 A. fib-loaded with IV Lanoxin, pressure stable, by mouth dig,back to NSR this am Diarrhea-C. difficile negative, Imodium when necessary, Zosyn switched to Rocephin yesterday, protonix added To stepdown

## 2014-12-08 ENCOUNTER — Inpatient Hospital Stay: Payer: Medicare Other

## 2014-12-08 LAB — COMPREHENSIVE METABOLIC PANEL
ALK PHOS: 81 U/L (ref 38–126)
ALT: 17 U/L (ref 14–54)
ANION GAP: 10 (ref 5–15)
AST: 23 U/L (ref 15–41)
Albumin: 2.3 g/dL — ABNORMAL LOW (ref 3.5–5.0)
BUN: 55 mg/dL — ABNORMAL HIGH (ref 6–20)
CHLORIDE: 101 mmol/L (ref 101–111)
CO2: 32 mmol/L (ref 22–32)
Calcium: 9.1 mg/dL (ref 8.9–10.3)
Creatinine, Ser: 0.89 mg/dL (ref 0.44–1.00)
GFR calc non Af Amer: >60 mL/min (ref >60)
GLUCOSE: 126 mg/dL — AB (ref 65–99)
POTASSIUM: 4.7 mmol/L (ref 3.5–5.1)
SODIUM: 143 mmol/L (ref 135–145)
Total Bilirubin: 0.3 mg/dL (ref 0.3–1.2)
Total Protein: 6.2 g/dL — ABNORMAL LOW (ref 6.5–8.1)

## 2014-12-08 LAB — CBC WITH DIFFERENTIAL/PLATELET
Band Neutrophils: 1 % (ref 0–10)
Basophils Absolute: 0 10*3/uL (ref 0.0–0.1)
Basophils Relative: 0 % (ref 0–1)
Blasts: 0 %
Eosinophils Absolute: 0 10*3/uL (ref 0.0–0.7)
Eosinophils Relative: 0 % (ref 0–5)
HCT: 42 % (ref 35.0–47.0)
Hemoglobin: 13.8 g/dL (ref 12.0–16.0)
LYMPHS ABS: 1.7 10*3/uL (ref 0.7–4.0)
Lymphocytes Relative: 10 % — ABNORMAL LOW (ref 12–46)
MCH: 27.7 pg (ref 26.0–34.0)
MCHC: 32.9 g/dL (ref 32.0–36.0)
MCV: 84.3 fL (ref 80.0–100.0)
METAMYELOCYTES PCT: 2 %
MONO ABS: 1 10*3/uL (ref 0.1–1.0)
Monocytes Relative: 6 % (ref 3–12)
Myelocytes: 0 %
Neutro Abs: 14.1 10*3/uL — ABNORMAL HIGH (ref 1.7–7.7)
Neutrophils Relative %: 81 % — ABNORMAL HIGH (ref 43–77)
OTHER: 0 %
PLATELETS: 146 10*3/uL — AB (ref 150–440)
PROMYELOCYTES ABS: 0 %
RBC: 4.99 MIL/uL (ref 3.80–5.20)
RDW: 15.1 % — AB (ref 11.5–14.5)
WBC: 16.8 10*3/uL — AB (ref 3.6–11.0)
nRBC: 0 /100 WBC

## 2014-12-08 LAB — INFLUENZA PANEL BY PCR (TYPE A & B)
H1N1 flu by pcr: NOT DETECTED
Influenza A By PCR: NEGATIVE
Influenza B By PCR: NEGATIVE

## 2014-12-08 LAB — TROPONIN I: Troponin I: 0.36 ng/mL — ABNORMAL HIGH (ref <0.031)

## 2014-12-08 MED ORDER — ALBUTEROL SULFATE (2.5 MG/3ML) 0.083% IN NEBU
2.5000 mg | INHALATION_SOLUTION | RESPIRATORY_TRACT | Status: DC
  Administered 2014-12-08 – 2014-12-10 (×13): 2.5 mg via RESPIRATORY_TRACT
  Filled 2014-12-08 (×13): qty 3

## 2014-12-08 MED ORDER — CEFTRIAXONE SODIUM IN DEXTROSE 20 MG/ML IV SOLN
1.0000 g | INTRAVENOUS | Status: DC
  Administered 2014-12-09 – 2014-12-13 (×5): 1 g via INTRAVENOUS
  Filled 2014-12-08 (×8): qty 50

## 2014-12-08 MED ORDER — BUDESONIDE 0.5 MG/2ML IN SUSP
0.5000 mg | Freq: Two times a day (BID) | RESPIRATORY_TRACT | Status: DC
  Administered 2014-12-08 – 2014-12-15 (×14): 0.5 mg via RESPIRATORY_TRACT
  Filled 2014-12-08 (×14): qty 2

## 2014-12-08 MED ORDER — TIOTROPIUM BROMIDE MONOHYDRATE 18 MCG IN CAPS
18.0000 ug | ORAL_CAPSULE | Freq: Every day | RESPIRATORY_TRACT | Status: DC
  Administered 2014-12-08 – 2014-12-15 (×8): 18 ug via RESPIRATORY_TRACT
  Filled 2014-12-08 (×2): qty 5

## 2014-12-08 MED ORDER — VANCOMYCIN HCL 500 MG IV SOLR
500.0000 mg | INTRAVENOUS | Status: DC
  Administered 2014-12-09: 500 mg via INTRAVENOUS
  Filled 2014-12-08 (×2): qty 500

## 2014-12-08 NOTE — Progress Notes (Addendum)
Nutrition Follow-up  DOCUMENTATION CODES:  Non-severe (moderate) malnutrition in context of chronic illness  INTERVENTION:   (Medical Nutrition Supplement: Recommend continuing ensure enlive TID and magic cup q daily for added nutrition) Pt meeting nutritional needs with eating 20-25% meals, 100% of ensure and magic cup  NUTRITION DIAGNOSIS:  Inadequate oral intake related to acute illness as evidenced by meal completion < 25%.    GOAL:  Patient will meet greater than or equal to 90% of their needs    MONITOR:   (Energy Intake, Electrolyte and renal Profile, Pulmonary Profile)  REASON FOR ASSESSMENT:   (RD Screen, BMI 15.4)    ASSESSMENT:  Pt with bilateral pneumonia,  Sepsis on HFNC.  Pt reports eating 20-25% of meals since admission and drinking 100% of ensure enlive (TID) and most of magic cup.    Electrolyte and Renal Profile:    Recent Labs Lab 12/06/14 1601 12/07/14 0411 12/08/14 0353  BUN 42* 47* 55*  CREATININE 1.36* 1.06* 0.89  NA 140 142 143  K 3.6 4.5 4.7   Medications: reviewed  Height:  Ht Readings from Last 1 Encounters:  12/04/14 5\' 3"  (1.6 m)    Weight:  Wt Readings from Last 1 Encounters:  12/08/14 72 lb 8.5 oz (32.9 kg)    BMI:  Body mass index is 12.85 kg/(m^2).  Estimated Nutritional Needs:  Kcal:  1296-1532kcals, BEE: 982kcals, TEE: (IF 1.1-1.3)(AF 1.2) using IBW of 52.3kg  Protein:  52-63g protein (1.0-1.2g/kg) using IBW of 52.3kg  Fluid:  1308-1564mL of fluid (25-55mL/kg) using IBW of 52.3kg  Skin:  Reviewed, no issues  Diet Order:  Diet Heart Room service appropriate?: Yes; Fluid consistency:: Thin  EDUCATION NEEDS:  Education needs no appropriate at this time   Intake/Output Summary (Last 24 hours) at 12/08/14 1326 Last data filed at 12/08/14 1200  Gross per 24 hour  Intake    350 ml  Output   2725 ml  Net  -2375 ml    Last BM:  5/31  MODERATE Care Level Trenee Igoe B. Zenia Resides, [place], [place]  (pager)

## 2014-12-08 NOTE — Progress Notes (Signed)
Patient ID: Veronica Wang, female   DOB: [birth year], 79 y.o.   MRN: [id number]   SUBJECTIVE:  Patient admitted with bilateral lower pneumonia and sepsis with acute on chronic resp failure.  Chronically on 2-3L, with sats low on this (and intermittent compliance).  Last seen in office 4/22 (all notes available in Care Everywhere) with no new symptoms at that time.  Her sister became ill with myalgias, gi symptoms, and pt spent the week with her before developing similar symptoms.  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  Cough improved; remains nonproductive.  Feels better overall.   ______________________________________________________________________  ROS: Please see HPI; remainder of complete 10 point ROS is negative  . antiseptic oral rinse  7 mL Mouth Rinse BID  . aspirin EC  81 mg Oral Daily  . azithromycin  500 mg Intravenous Q24H  . cefTRIAXone (ROCEPHIN)  IV  1 g Intravenous Q12H  . cholecalciferol  2,000 Units Oral Daily  . digoxin  0.125 mg Oral Daily  . feeding supplement (ENSURE ENLIVE)  237 mL Oral TID WC  . furosemide  20 mg Intravenous Q12H  . gabapentin  1,200 mg Oral TID  . heparin  5,000 Units Subcutaneous 3 times per day  . ipratropium  0.5 mg Nebulization Q6H  . methylPREDNISolone (SOLU-MEDROL) injection  40 mg Intravenous Q12H  . multivitamin with minerals  1 tablet Oral Daily  . pantoprazole  40 mg Oral Daily  . potassium chloride  20 mEq Oral Daily  . vancomycin (VANCOCIN) 500 mg IVPB  500 mg Intravenous Q36H   acetaminophen **OR** acetaminophen, loperamide, ondansetron **OR** ondansetron (ZOFRAN) IV   Past Medical History  Diagnosis Date  . Hypertension   . GERD (gastroesophageal reflux disease)   . Cancer     Liver  . Cancer     stomach  . FH: chemotherapy   . Radiation   . COPD (chronic obstructive pulmonary disease)   . CHF (congestive heart failure)     Past Surgical History  Procedure  Laterality Date  . Colon resection    . Cataract extraction  splenectomy  . Colon surgery      PHYSICAL EXAM:  BP 143/85 mmHg  Pulse 57  Temp(Src) 97.7 F (36.5 C) (Oral)  Resp 17  Ht 5\' 3"  (1.6 m)  Wt 32.9 kg (72 lb 8.5 oz)  BMI 12.85 kg/m2  SpO2 93% on HFNC  Constitutional: thin female, appears mildly ill HEENT: PERRL; EOMI; OP moist w/o lesions Neck: supple, no thyromegaly Chest: normal to palpation Respiratory: Bilateral rhonchi with reduced airflow; no wheeze or retractions Cardiovascular: RRR, no murmur, no gallop; distal pulses 2+ Abdomen: soft, good BS, no tenderness or rebound Extremities: no cyanosis or edema Neuro: alert, no focal motor or sensory deficits Derm: no sig rashes or nodules Lymph: no cervical or supraclavicular lymphadenopathy  ASSESSMENT/PLAN:  Labs and imaging studies were reviewed  1. Septic shock/bilateral pneumonia/acute on chronic respiratory failure-clinically improved, though sats remain low.  Pt with baseline hypoxia, and target sat will likely be close to 90%. PUlmonary following; will ask ID to see as well, given sister admitted with similar symptoms.  Add flutter valve; send legionella and H1N1, flu 2. COPD with acute exacerbation- cont steroids and inhaled meds 3. Chronic left side systolic CHF-follow fluid status; BP up; following 4. Acute renal failure- improved, though BUN still high;  Hold lasix 5. A. fib-cont dig and follow HR 6. Diarrhea-C. 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 Severe protein/calorie malnutrition- dietary consult

## 2014-12-09 LAB — BASIC METABOLIC PANEL
ANION GAP: 7 (ref 5–15)
BUN: 46 mg/dL — AB (ref 6–20)
CHLORIDE: 99 mmol/L — AB (ref 101–111)
CO2: 36 mmol/L — AB (ref 22–32)
Calcium: 9.1 mg/dL (ref 8.9–10.3)
Creatinine, Ser: 0.76 mg/dL (ref 0.44–1.00)
GFR calc Af Amer: >60 mL/min (ref >60)
GLUCOSE: 120 mg/dL — AB (ref 65–99)
POTASSIUM: 4.7 mmol/L (ref 3.5–5.1)
SODIUM: 142 mmol/L (ref 135–145)

## 2014-12-09 LAB — CULTURE, BLOOD (ROUTINE X 2)
Culture: NO GROWTH
Special Requests: NORMAL

## 2014-12-09 LAB — CBC
HCT: 42.9 % (ref 35.0–47.0)
Hemoglobin: 14.2 g/dL (ref 12.0–16.0)
MCH: 27.9 pg (ref 26.0–34.0)
MCHC: 33.1 g/dL (ref 32.0–36.0)
MCV: 84.1 fL (ref 80.0–100.0)
PLATELETS: 164 10*3/uL (ref 150–440)
RBC: 5.1 MIL/uL (ref 3.80–5.20)
RDW: 15 % — AB (ref 11.5–14.5)
WBC: 17.1 10*3/uL — ABNORMAL HIGH (ref 3.6–11.0)

## 2014-12-09 LAB — MISC LABCORP TEST (SEND OUT): Labcorp test code: 182246

## 2014-12-09 LAB — MAGNESIUM: MAGNESIUM: 2 mg/dL (ref 1.7–2.4)

## 2014-12-09 LAB — PHOSPHORUS: Phosphorus: 4.5 mg/dL (ref 2.5–4.6)

## 2014-12-09 LAB — VANCOMYCIN, TROUGH: Vancomycin Tr: 5 ug/mL — ABNORMAL LOW (ref 10–20)

## 2014-12-09 MED ORDER — METHYLPREDNISOLONE SODIUM SUCC 40 MG IJ SOLR
40.0000 mg | Freq: Every day | INTRAMUSCULAR | Status: DC
  Administered 2014-12-09: 40 mg via INTRAVENOUS
  Filled 2014-12-09: qty 1

## 2014-12-09 MED ORDER — VANCOMYCIN HCL 500 MG IV SOLR
500.0000 mg | INTRAVENOUS | Status: DC
  Filled 2014-12-09: qty 500

## 2014-12-09 MED ORDER — BENZOCAINE 10 % MT GEL
Freq: Four times a day (QID) | OROMUCOSAL | Status: DC | PRN
  Filled 2014-12-09: qty 9.4

## 2014-12-09 NOTE — Care Management Note (Signed)
Case Management Note  Patient Details  Name: Veronica Wang MRN: [id number] Date of Birth: 27-Feb-1936  Subjective/Objective:   Requested PT consult from primary nurse who was in the process of calling Dr. Caryl Comes.    Action/Plan:   Expected Discharge Date:                  Expected Discharge Plan:  Home/Self Care  In-House Referral:     Discharge planning Services  CM Consult  Post Acute Care Choice:    Choice offered to:     DME Arranged:    DME Agency:     HH Arranged:    HH Agency:     Status of Service:  In process, will continue to follow  Medicare Important Message Given:  Yes Date Medicare IM Given:  12/09/14 Medicare IM give by:  Orvan July Date Additional Medicare IM Given:    Additional Medicare Important Message give by:     If discussed at [place] of Stay Meetings, dates discussed:    Additional Comments:  Jolly Mango, RN 12/09/2014, 11:36 AM

## 2014-12-09 NOTE — Consult Note (Signed)
[facility]     Reason for Consult: PNA  Referring Physician: Ramonita Lab Date of Admission:  12/04/2014   Principal Problem:   Pneumonia Active Problems:   Acute respiratory failure with hypoxia   Septic shock   COPD (chronic obstructive pulmonary disease)   Malnutrition of moderate degree   HPI: Veronica Wang is a 79 y.o. female with hx of COPD, CHF admitted 5/28 with increased resp distress.  Per the patient she has had mild sob cough for 2 weeks and took an abx about 2 weeks ago.  Then was visiting her sister who was caring for her grandchild.  Following that visit she felt ill with weakness and resp distress.  Has not had a productive cough.  CXR on admit showed infiltrate bil bases.  Admitted to ICU on vanco zosyn and azithromycin initially but then zosyn changed to ceftriaxone.  Also on steroids.  Of note has been afebrile.  WBC was 10 on admission and has increased to 20 likely due to steroids.   BCX neg, Sputum cx pending.  Her sister was also admitted with PNA.   Past Medical History  Diagnosis Date  . Hypertension   . GERD (gastroesophageal reflux disease)   . Cancer     Liver  . Cancer     stomach  . FH: chemotherapy   . Radiation   . COPD (chronic obstructive pulmonary disease)   . CHF (congestive heart failure)    Past Surgical History  Procedure Laterality Date  . Colon resection    . Cataract extraction  splenectomy  . Colon surgery     History  Substance Use Topics  . Smoking status: Never Smoker   . Smokeless tobacco: Not on file  . Alcohol Use: No   Family History  Problem Relation Age of Onset  . Heart attack Sister   . Heart attack Brother     Allergies:  Allergies  Allergen Reactions  . No Known Allergies     Current antibiotics: Antibiotics Given (last 72 hours)    Date/Time Action Medication Dose Rate   12/06/14 1358 Given  [IV site bad, had to start another PIV]   cefTRIAXone (ROCEPHIN) 1 g in dextrose 5 %  50 mL IVPB - Premix 1 g 100 mL/hr   12/06/14 2156 Given   vancomycin (VANCOCIN) 500 mg in dextrose 5 % 100 mL IVPB 500 mg 100 mL/hr   12/06/14 2157 Given   cefTRIAXone (ROCEPHIN) 1 g in dextrose 5 % 50 mL IVPB - Premix 1 g 100 mL/hr   12/07/14 0930 Given   cefTRIAXone (ROCEPHIN) 1 g in dextrose 5 % 50 mL IVPB - Premix 1 g 100 mL/hr   12/07/14 0930 Given   azithromycin (ZITHROMAX) 500 mg in dextrose 5 % 250 mL IVPB 500 mg 250 mL/hr   12/07/14 2156 Given   cefTRIAXone (ROCEPHIN) 1 g in dextrose 5 % 50 mL IVPB - Premix 1 g 100 mL/hr   12/08/14 0817 Given   cefTRIAXone (ROCEPHIN) 1 g in dextrose 5 % 50 mL IVPB - Premix 1 g 100 mL/hr   12/08/14 0851 Given   vancomycin (VANCOCIN) 500 mg in dextrose 5 % 100 mL IVPB 500 mg 100 mL/hr   12/08/14 0956 Given   azithromycin (ZITHROMAX) 500 mg in dextrose 5 % 250 mL IVPB 500 mg 250 mL/hr   12/09/14 0917 Given   cefTRIAXone (ROCEPHIN) 1 g in dextrose 5 % 50 mL IVPB - Premix 1 g  100 mL/hr   12/09/14 1010 Given   azithromycin (ZITHROMAX) 500 mg in dextrose 5 % 250 mL IVPB 500 mg 250 mL/hr   12/09/14 1015 Given   vancomycin (VANCOCIN) 500 mg in dextrose 5 % 100 mL IVPB 500 mg 100 mL/hr      MEDICATIONS: . albuterol  2.5 mg Nebulization Q4H  . antiseptic oral rinse  7 mL Mouth Rinse BID  . aspirin EC  81 mg Oral Daily  . azithromycin  500 mg Intravenous Q24H  . budesonide (PULMICORT) nebulizer solution  0.5 mg Nebulization BID  . cefTRIAXone (ROCEPHIN)  IV  1 g Intravenous Q24H  . cholecalciferol  2,000 Units Oral Daily  . feeding supplement (ENSURE ENLIVE)  237 mL Oral TID WC  . gabapentin  1,200 mg Oral TID  . heparin  5,000 Units Subcutaneous 3 times per day  . methylPREDNISolone (SOLU-MEDROL) injection  40 mg Intravenous Daily  . multivitamin with minerals  1 tablet Oral Daily  . pantoprazole  40 mg Oral Daily  . tiotropium  18 mcg Inhalation Daily  . [START ON 12/10/2014] vancomycin (VANCOCIN) 500 mg IVPB  500 mg Intravenous Q18H     Review of Systems - 11 systems reviewed and negative per HPI   OBJECTIVE: Temp:  [97.6 F (36.4 C)-98.4 F (36.9 C)] 98 F (36.7 C) (06/01 1200) Pulse Rate:  [48-125] 87 (06/01 1300) Resp:  [11-30] 22 (06/01 1300) BP: (100-128)/(62-95) 100/70 mmHg (06/01 1300) SpO2:  [86 %-96 %] 90 % (06/01 1300) FiO2 (%):  [66 %-67.6 %] 66 % (06/01 0343) Weight:  [35.7 kg (78 lb 11.3 oz)] 35.7 kg (78 lb 11.3 oz) (06/01 0454) Physical Exam  Constitutional:  oriented to person, place, and time. Thin frail in mild resp distress - using hi flo O2 HENT: [place]/AT, PERRLA, no scleral icterus Mouth/Throat: Oropharynx is clear and dry. No oropharyngeal exudate.  Cardiovascular: distant Pulmonary/Chest: bil rhonchi Neck = supple, no nuchal rigidity Abdominal: Soft. Bowel sounds are normal.  exhibits no distension. There is no tenderness.  Lymphadenopathy: no cervical adenopathy. No axillary adenopathy Neurological: alert and oriented to person, place, and time.  Skin: Skin is warm and dry. No rash noted. No erythema.  Psychiatric: a normal mood and affect.  behavior is normal.  EXT no cce  LAB: Results for orders placed or performed during the hospital encounter of 12/04/14 (from the past 48 hour(s))  CBC with Differential/Platelet     Status: Abnormal   Collection Time: 12/08/14  3:53 AM  Result Value Ref Range   WBC 16.8 (H) 3.6 - 11.0 K/uL   RBC 4.99 3.80 - 5.20 MIL/uL   Hemoglobin 13.8 12.0 - 16.0 g/dL   HCT 42.0 35.0 - 47.0 %   MCV 84.3 80.0 - 100.0 fL   MCH 27.7 26.0 - 34.0 pg   MCHC 32.9 32.0 - 36.0 g/dL   RDW 15.1 (H) 11.5 - 14.5 %   Platelets 146 (L) 150 - 440 K/uL   Neutrophils Relative % 81 (H) 43 - 77 %   Lymphocytes Relative 10 (L) 12 - 46 %   Monocytes Relative 6 3 - 12 %   Eosinophils Relative 0 0 - 5 %   Basophils Relative 0 0 - 1 %   Band Neutrophils 1 0 - 10 %   Metamyelocytes Relative 2 %   Myelocytes 0 %   Promyelocytes Absolute 0 %   Blasts 0 %   nRBC 0 0 /100 WBC    Other  0 %   Neutro Abs 14.1 (H) 1.7 - 7.7 K/uL   Lymphs Abs 1.7 0.7 - 4.0 K/uL   Monocytes Absolute 1.0 0.1 - 1.0 K/uL   Eosinophils Absolute 0.0 0.0 - 0.7 K/uL   Basophils Absolute 0.0 0.0 - 0.1 K/uL   RBC Morphology POLYCHROMASIA PRESENT    Smear Review LARGE PLATELETS PRESENT   Comprehensive metabolic panel     Status: Abnormal   Collection Time: 12/08/14  3:53 AM  Result Value Ref Range   Sodium 143 135 - 145 mmol/L   Potassium 4.7 3.5 - 5.1 mmol/L   Chloride 101 101 - 111 mmol/L   CO2 32 22 - 32 mmol/L   Glucose, Bld 126 (H) 65 - 99 mg/dL   BUN 55 (H) 6 - 20 mg/dL   Creatinine, Ser 0.89 0.44 - 1.00 mg/dL   Calcium 9.1 8.9 - 10.3 mg/dL   Total Protein 6.2 (L) 6.5 - 8.1 g/dL   Albumin 2.3 (L) 3.5 - 5.0 g/dL   AST 23 15 - 41 U/L   ALT 17 14 - 54 U/L   Alkaline Phosphatase 81 38 - 126 U/L   Total Bilirubin 0.3 0.3 - 1.2 mg/dL   GFR calc non Af Amer >60 >60 mL/min   GFR calc Af Amer >60 >60 mL/min    Comment: (NOTE) The eGFR has been calculated using the CKD EPI equation. This calculation has not been validated in all clinical situations. eGFR's persistently <60 mL/min signify possible Chronic Kidney Disease.    Anion gap 10 5 - 15  Troponin I     Status: Abnormal   Collection Time: 12/08/14  3:53 AM  Result Value Ref Range   Troponin I 0.36 (H) <0.031 ng/mL    Comment: READ BACK AND VERIFIED WITH HIRAL PATEL AT 0703 ON 12/08/14.Marland KitchenMarland KitchenGermanton        PERSISTENTLY INCREASED TROPONIN VALUES IN THE RANGE OF 0.04-0.49 ng/mL CAN BE SEEN IN:       -UNSTABLE ANGINA       -CONGESTIVE HEART FAILURE       -MYOCARDITIS       -CHEST TRAUMA       -ARRYHTHMIAS       -LATE PRESENTING MYOCARDIAL INFARCTION       -COPD   CLINICAL FOLLOW-UP RECOMMENDED.   Influenza panel by PCR (type A & B, H1N1)     Status: None   Collection Time: 12/08/14  8:24 AM  Result Value Ref Range   Influenza A By PCR NEGATIVE NEGATIVE   Influenza B By PCR NEGATIVE NEGATIVE   H1N1 flu by pcr NOT DETECTED NOT  DETECTED    Comment:        The Xpert Flu assay (FDA approved for nasal aspirates or washes and nasopharyngeal swab specimens), is intended as an aid in the diagnosis of influenza and should not be used as a sole basis for treatment.   CBC     Status: Abnormal   Collection Time: 12/09/14  6:16 AM  Result Value Ref Range   WBC 17.1 (H) 3.6 - 11.0 K/uL   RBC 5.10 3.80 - 5.20 MIL/uL   Hemoglobin 14.2 12.0 - 16.0 g/dL   HCT 42.9 35.0 - 47.0 %   MCV 84.1 80.0 - 100.0 fL   MCH 27.9 26.0 - 34.0 pg   MCHC 33.1 32.0 - 36.0 g/dL   RDW 15.0 (H) 11.5 - 14.5 %   Platelets 164 150 - 440 K/uL  Magnesium  Status: None   Collection Time: 12/09/14  6:16 AM  Result Value Ref Range   Magnesium 2.0 1.7 - 2.4 mg/dL  Phosphorus     Status: None   Collection Time: 12/09/14  6:16 AM  Result Value Ref Range   Phosphorus 4.5 2.5 - 4.6 mg/dL  Basic metabolic panel     Status: Abnormal   Collection Time: 12/09/14  6:16 AM  Result Value Ref Range   Sodium 142 135 - 145 mmol/L   Potassium 4.7 3.5 - 5.1 mmol/L   Chloride 99 (L) 101 - 111 mmol/L   CO2 36 (H) 22 - 32 mmol/L   Glucose, Bld 120 (H) 65 - 99 mg/dL   BUN 46 (H) 6 - 20 mg/dL   Creatinine, Ser 0.76 0.44 - 1.00 mg/dL   Calcium 9.1 8.9 - 10.3 mg/dL   GFR calc non Af Amer >60 >60 mL/min   GFR calc Af Amer >60 >60 mL/min    Comment: (NOTE) The eGFR has been calculated using the CKD EPI equation. This calculation has not been validated in all clinical situations. eGFR's persistently <60 mL/min signify possible Chronic Kidney Disease.    Anion gap 7 5 - 15  Vancomycin, trough     Status: Abnormal   Collection Time: 12/09/14  8:49 AM  Result Value Ref Range   Vancomycin Tr 5 (L) 10 - 20 ug/mL   No components found for: ESR, C REACTIVE PROTEIN MICRO: Recent Results (from the past 720 hour(s))  Culture, blood (routine x 2)     Status: None (Preliminary result)   Collection Time: 12/04/14  9:35 PM  Result Value Ref Range Status    Specimen Description BLOOD  Final   Special Requests Normal  Final   Culture NO GROWTH 4 DAYS  Final   Report Status PENDING  Incomplete  Blood culture (routine x 2)     Status: None (Preliminary result)   Collection Time: 12/04/14  9:35 PM  Result Value Ref Range Status   Specimen Description BLOOD  Final   Special Requests Normal  Final   Culture NO GROWTH 3 DAYS  Final   Report Status PENDING  Incomplete  Urine culture     Status: None   Collection Time: 12/04/14 10:30 PM  Result Value Ref Range Status   Specimen Description URINE, CLEAN CATCH  Final   Special Requests NONE  Final   Culture NO GROWTH 2 DAYS  Final   Report Status 12/06/2014 FINAL  Final  MRSA PCR Screening     Status: None   Collection Time: 12/05/14  9:34 AM  Result Value Ref Range Status   MRSA by PCR NEGATIVE NEGATIVE Final    Comment:        The GeneXpert MRSA Assay (FDA approved for NASAL specimens only), is one component of a comprehensive MRSA colonization surveillance program. It is not intended to diagnose MRSA infection nor to guide or monitor treatment for MRSA infections.   C difficile quick scan w PCR reflex [facility])     Status: None   Collection Time: 12/06/14  4:02 PM  Result Value Ref Range Status   C Diff antigen NEGATIVE  Final   C Diff toxin NEGATIVE  Final   C Diff interpretation Negative for C. difficile  Final    IMAGING: Dg Chest 1 View  12/08/2014   CLINICAL DATA:  Bilateral lower lobe pneumonia and sepsis. Acute on chronic respiratory failure.  EXAM: CHEST  1 VIEW  COMPARISON:  One-view chest x-ray 12/07/2014.  FINDINGS: The heart is still enlarged. Interstitial edema is slightly improved. Bibasilar airspace opacities persist. Small effusions are suggested, slightly improved. The soft tissues and bony thorax are unremarkable.  IMPRESSION: 1. Slightly improved interstitial edema and bilateral effusions. 2. Persistent bibasilar airspace disease concerning for pneumonia.    Electronically Signed   By: San Morelle M.D.   On: 12/08/2014 08:45   Dg Chest 1 View  12/04/2014   CLINICAL DATA:  Difficulty breathing with hypoxia  EXAM: CHEST  1 VIEW  COMPARISON:  06/12/2014  FINDINGS: Cardiac shadow is within normal limits. Diffuse infiltrative density is noted projecting in the right lower lobe. Minimal left basilar changes are noted as well. Mild vascular congestion and interstitial edema is noted.  IMPRESSION: Bilateral lower lobe infiltrates superimposed over vascular congestion.   Electronically Signed   By: Inez Catalina M.D.   On: 12/04/2014 21:41   Dg Chest Port 1 View  12/08/2014   CLINICAL DATA:  PICC line placement, history hypertension, carcinoid tumor, COPD, CHF  EXAM: PORTABLE CHEST - 1 VIEW  COMPARISON:  Portable exam 1758 hours compared to 12/08/2014  FINDINGS: Tip of RIGHT arm PICC line projects over cavoatrial junction.  Enlargement of cardiac silhouette.  Mediastinal contours and pulmonary vascularity normal.  Emphysematous changes with RIGHT lower lobe infiltrate and minimal RIGHT basilar atelectasis.  Additional atelectasis versus consolidation of LEFT lower lobe.  No pleural effusion or pneumothorax.  IMPRESSION: BILATERAL lower lobe atelectasis versus consolidation.  Tip of RIGHT arm PICC line now projects over cavoatrial junction.   Electronically Signed   By: Lavonia Dana M.D.   On: 12/08/2014 18:07   Dg Chest Port 1 View  12/08/2014   CLINICAL DATA:  PICC line placement.  EXAM: PORTABLE CHEST - 1 VIEW  COMPARISON:  12/08/2014  FINDINGS: A right-sided PICC line is noted with tip overlying the upper right atrium. Consider 1-2 cm retraction.  Interstitial edema, bibasilar atelectasis and left lower lung consolidation/atelectasis again noted.  There is no evidence of pneumothorax.  IMPRESSION: Right PICC line with tip overlying the upper right atrium -consider 1-2 cm retraction.   Electronically Signed   By: Margarette Canada M.D.   On: 12/08/2014 17:55   Dg  Chest Port 1 View  12/07/2014   CLINICAL DATA:  Bilateral pneumonia and septic shock  EXAM: PORTABLE CHEST - 1 VIEW  COMPARISON:  12/04/2014  FINDINGS: Normal heart size. There are bilateral pleural effusions and mild interstitial edema consistent with CHF. Right midlung and right base airspace disease is identified consistent with pneumonia.  IMPRESSION: 1. CHF with superimposed right lung pneumonia.   Electronically Signed   By: Kerby Moors M.D.   On: 12/07/2014 10:39   Mm Digital Screening Bilateral  11/24/2014   CLINICAL DATA:  Screening.  EXAM: DIGITAL SCREENING BILATERAL MAMMOGRAM WITH CAD  COMPARISON:  Previous exam(s).  ACR Breast Density Category b: There are scattered areas of fibroglandular density.  FINDINGS: There are no findings suspicious for malignancy. Images were processed with CAD.  IMPRESSION: No mammographic evidence of malignancy. A result letter of this screening mammogram will be mailed directly to the patient.  RECOMMENDATION: Screening mammogram in one year. (Code:SM-B-01Y)  BI-RADS CATEGORY  1: Negative.   Electronically Signed   By: Lajean Manes M.D.   On: 11/24/2014 12:32   Echo Study Conclusions - Left ventricle: Systolic function was mildly reduced. The estimated ejection fraction was in the range of 45% to 50%. Left ventricular  diastolic function parameters were normal. - Mitral valve: There was moderate regurgitation. - Right atrium: The atrium was mildly dilated. Assessment:   Veronica Wang is a 79 y.o. female with hx of COPD, CHF admitted 5/28 with increased resp distress.  Per the patient she has had mild sob cough for 2 weeks and took an abx about 2 weeks ago.  Then was visiting her sister who was caring for her grandchild.  Following that visit she felt ill with weakness and resp distress.  Has not had a productive cough.  CXR on admit showed infiltrate bil bases.  Admitted to ICU on vanco zosyn and azithromycin initially but then zosyn changed to  ceftriaxone.  Also on steroids. Of note has been afebrile.  WBC was 10 on admission and has increased to 20 likely due to steroids.   BCX neg, Sputum cx has been unable to be collected.  Currently main issue is dry cough and diff to wean O2.  She does have CHF EF 45-50% Her sister was also admitted with PNA but is improving.  Flu negative PCR  I suspect possible viral pathogen as participating cause for her illness.  She has a very dry cough and did not have leukocytosis on admission.  Clinically no fevers and main issue seems to be oxygenation likely multifactorial  Recommendations Check urine legionella Would dc vancomycin  Continue ceftraixone and azithro. Diuresis as needed. Cont nebs and copd treatment. Thank you very much for allowing me to participate in the care of this patient. Please call with questions.   Cheral Marker. Ola Spurr, MD

## 2014-12-09 NOTE — Progress Notes (Signed)
Patient ID: Veronica Wang, female   DOB: 1936/07/09, 79 y.o.   MRN: [id number]   SUBJECTIVE:  Patient admitted with bilateral lower pneumonia and sepsis with acute on chronic resp failure.  Chronically on 2-3L, with intermittent compliance.  Her sister became ill with myalgias, gi symptoms, and pt spent the week with her before developing similar symptoms.  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  Feels better; some cough, but no fever, chills.  Using flutter valve.  No wheezing reported.  Reports appetite OK; bowels slightly improved. No new c/o; wants to get out of bed.   ______________________________________________________________________  ROS: Please see HPI; remainder of complete 10 point ROS is negative  . albuterol  2.5 mg Nebulization Q4H  . antiseptic oral rinse  7 mL Mouth Rinse BID  . aspirin EC  81 mg Oral Daily  . azithromycin  500 mg Intravenous Q24H  . budesonide (PULMICORT) nebulizer solution  0.5 mg Nebulization BID  . cefTRIAXone (ROCEPHIN)  IV  1 g Intravenous Q24H  . cholecalciferol  2,000 Units Oral Daily  . digoxin  0.125 mg Oral Daily  . feeding supplement (ENSURE ENLIVE)  237 mL Oral TID WC  . gabapentin  1,200 mg Oral TID  . heparin  5,000 Units Subcutaneous 3 times per day  . methylPREDNISolone (SOLU-MEDROL) injection  40 mg Intravenous Daily  . multivitamin with minerals  1 tablet Oral Daily  . pantoprazole  40 mg Oral Daily  . tiotropium  18 mcg Inhalation Daily  . vancomycin (VANCOCIN) 500 mg IVPB  500 mg Intravenous Q24H   acetaminophen **OR** acetaminophen, loperamide, ondansetron **OR** ondansetron (ZOFRAN) IV   Past Medical History  Diagnosis Date  . Hypertension   . GERD (gastroesophageal reflux disease)   . Cancer     Liver  . Cancer     stomach  . FH: chemotherapy   . Radiation   . COPD (chronic obstructive pulmonary disease)   . CHF (congestive heart failure)     Past Surgical History  Procedure Laterality Date  . Colon  resection    . Cataract extraction  splenectomy  . Colon surgery      PHYSICAL EXAM:  BP 120/64 mmHg  Pulse 63  Temp(Src) 97.8 F (36.6 C) (Oral)  Resp 11  Ht 5\' 3"  (1.6 m)  Wt 35.7 kg (78 lb 11.3 oz)  BMI 13.95 kg/m2  SpO2 90% on HFNC  Constitutional: thin female, appears mildly ill HEENT: PERRL; EOMI; OP moist w/o lesions Neck: supple, no thyromegaly Chest: normal to palpation Respiratory: Bilateral rhonchi with reduced airflow; no wheeze or retractions Cardiovascular: RRR, no murmur, no gallop; distal pulses 2+ Abdomen: soft, positive BS, no tenderness or rebound Extremities: no cyanosis or edema Neuro: alert, no focal motor or sensory deficits Derm: no sig rashes or nodules Lymph: no cervical or supraclavicular lymphadenopathy  ASSESSMENT/PLAN:  Labs and imaging studies were reviewed  1. Septic shock/bilateral pneumonia/acute on chronic respiratory failure- wean steroids; wean oxygen as able.  Flu studies negative; sputum culture if able.  Mobilize as able.  Pulmonary has seen; ID to see.  Cont current abx for now, as no clear cause of pneumonia 2. COPD with acute exacerbation- cont inhaled meds; wean steroids as noted above.  Follow WBC 3. Chronic left side systolic CHF-off diuretics; following 4. Acute renal failure- improved, with BUN lower; cont to hold lasix 5. A. fib-follow HR; in NSR currently 6. Diarrhea-C. 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  Last  octreotide several weeks ago 7. Moderate protein/calorie malnutrition- appreciate dietary consult

## 2014-12-10 DIAGNOSIS — J9601 Acute respiratory failure with hypoxia: Secondary | ICD-10-CM

## 2014-12-10 DIAGNOSIS — J189 Pneumonia, unspecified organism: Secondary | ICD-10-CM

## 2014-12-10 DIAGNOSIS — J439 Emphysema, unspecified: Secondary | ICD-10-CM

## 2014-12-10 LAB — CULTURE, BLOOD (ROUTINE X 2)
CULTURE: NO GROWTH
SPECIAL REQUESTS: NORMAL

## 2014-12-10 LAB — CBC
HCT: 41.2 % (ref 35.0–47.0)
Hemoglobin: 13.4 g/dL (ref 12.0–16.0)
MCH: 27.4 pg (ref 26.0–34.0)
MCHC: 32.5 g/dL (ref 32.0–36.0)
MCV: 84.1 fL (ref 80.0–100.0)
Platelets: 177 10*3/uL (ref 150–440)
RBC: 4.9 MIL/uL (ref 3.80–5.20)
RDW: 15 % — ABNORMAL HIGH (ref 11.5–14.5)
WBC: 19.2 10*3/uL — ABNORMAL HIGH (ref 3.6–11.0)

## 2014-12-10 LAB — BASIC METABOLIC PANEL
Anion gap: 6 (ref 5–15)
BUN: 38 mg/dL — ABNORMAL HIGH (ref 6–20)
CO2: 37 mmol/L — ABNORMAL HIGH (ref 22–32)
Calcium: 8.6 mg/dL — ABNORMAL LOW (ref 8.9–10.3)
Chloride: 99 mmol/L — ABNORMAL LOW (ref 101–111)
Creatinine, Ser: 0.72 mg/dL (ref 0.44–1.00)
GFR calc Af Amer: >60 mL/min (ref >60)
GFR calc non Af Amer: >60 mL/min (ref >60)
Glucose, Bld: 85 mg/dL (ref 65–99)
Potassium: 4.8 mmol/L (ref 3.5–5.1)
Sodium: 142 mmol/L (ref 135–145)

## 2014-12-10 LAB — GLUCOSE, CAPILLARY: Glucose-Capillary: 123 mg/dL — ABNORMAL HIGH (ref 65–99)

## 2014-12-10 MED ORDER — AZITHROMYCIN 250 MG PO TABS
250.0000 mg | ORAL_TABLET | Freq: Every day | ORAL | Status: DC
  Administered 2014-12-10 – 2014-12-13 (×4): 250 mg via ORAL
  Filled 2014-12-10 (×4): qty 1

## 2014-12-10 MED ORDER — ALBUTEROL SULFATE (2.5 MG/3ML) 0.083% IN NEBU
2.5000 mg | INHALATION_SOLUTION | Freq: Four times a day (QID) | RESPIRATORY_TRACT | Status: DC
  Administered 2014-12-11 – 2014-12-13 (×9): 2.5 mg via RESPIRATORY_TRACT
  Filled 2014-12-10 (×9): qty 3

## 2014-12-10 MED ORDER — PREDNISONE 20 MG PO TABS
40.0000 mg | ORAL_TABLET | Freq: Every day | ORAL | Status: DC
  Administered 2014-12-10 – 2014-12-13 (×4): 40 mg via ORAL
  Filled 2014-12-10 (×4): qty 2

## 2014-12-10 NOTE — Progress Notes (Signed)
Patient ID: Veronica Wang, female   DOB: October 24, 1935, 79 y.o.   MRN: [id number]   Transfer to telemetry

## 2014-12-10 NOTE — Consult Note (Signed)
Date: 12/10/2014,   MRN# [id number] Veronica Wang 27-Jan-1936 Code Status:     Code Status Orders        Start     Ordered   12/05/14 0341  Full code   Continuous     12/05/14 0340     Hosp day:@LENGTHOFSTAYDAYS @ Referring MD: @ATDPROV @     PCP:       No acute events over night, fio2 remains at 55%, will wean as tolerated,  she denies SOB/WOB Plan for out of bed to chair, d/c foley, OK to transfer to gen med floor Patient started on BD therapy  Dr. Raul Del to follow up as patient transfers to floor [place] Medication:  No current outpatient prescriptions on file.  Current Medication:   Current facility-administered medications:  .  acetaminophen (TYLENOL) tablet 650 mg, 650 mg, Oral, Q6H PRN, 650 mg at 12/07/14 2156 **OR** acetaminophen (TYLENOL) suppository 650 mg, 650 mg, Rectal, Q6H PRN, Juluis Mire, MD .  albuterol (PROVENTIL) (2.5 MG/3ML) 0.083% nebulizer solution 2.5 mg, 2.5 mg, Nebulization, Q4H, Flora Lipps, MD, 2.5 mg at 12/10/14 0725 .  antiseptic oral rinse (CPC / CETYLPYRIDINIUM CHLORIDE 0.05%) solution 7 mL, 7 mL, Mouth Rinse, BID, Erby Pian, MD, 7 mL at 12/10/14 0945 .  aspirin EC tablet 81 mg, 81 mg, Oral, Daily, Juluis Mire, MD, 81 mg at 12/10/14 4098 .  azithromycin (ZITHROMAX) tablet 250 mg, 250 mg, Oral, Daily, Tama High III, MD, 250 mg at 12/10/14 0939 .  benzocaine (ORAJEL) 10 % mucosal gel, , Mouth/Throat, QID PRN, Tama High III, MD .  budesonide (PULMICORT) nebulizer solution 0.5 mg, 0.5 mg, Nebulization, BID, Flora Lipps, MD, 0.5 mg at 12/10/14 0725 .  cefTRIAXone (ROCEPHIN) 1 g in dextrose 5 % 50 mL IVPB - Premix, 1 g, Intravenous, Q24H, Flora Lipps, MD, 1 g at 12/10/14 0940 .  cholecalciferol (VITAMIN D) tablet 2,000 Units, 2,000 Units, Oral, Daily, Juluis Mire, MD, 2,000 Units at 12/10/14 [phone] .  feeding supplement (ENSURE ENLIVE) (ENSURE ENLIVE) liquid 237 mL, 237 mL, Oral, TID  WC, Rusty Aus, MD, 237 mL at 12/09/14 1649 .  gabapentin (NEURONTIN) tablet 1,200 mg, 1,200 mg, Oral, TID, Juluis Mire, MD, 1,200 mg at 12/10/14 4782 .  heparin injection 5,000 Units, 5,000 Units, Subcutaneous, 3 times per day, Juluis Mire, MD, 5,000 Units at 12/10/14 [phone] .  loperamide (IMODIUM) capsule 2 mg, 2 mg, Oral, PRN, Rusty Aus, MD, 2 mg at 12/10/14 1308 .  multivitamin with minerals tablet 1 tablet, 1 tablet, Oral, Daily, Juluis Mire, MD, 1 tablet at 12/10/14 [phone] .  ondansetron (ZOFRAN) tablet 4 mg, 4 mg, Oral, Q6H PRN **OR** ondansetron (ZOFRAN) injection 4 mg, 4 mg, Intravenous, Q6H PRN, Juluis Mire, MD .  pantoprazole (PROTONIX) EC tablet 40 mg, 40 mg, Oral, Daily, Rusty Aus, MD, 40 mg at 12/10/14 0939 .  predniSONE (DELTASONE) tablet 40 mg, 40 mg, Oral, Q breakfast, Tama High III, MD, 40 mg at 12/10/14 [phone] .  tiotropium (SPIRIVA) inhalation capsule 18 mcg, 18 mcg, Inhalation, Daily, Flora Lipps, MD, 18 mcg at 12/10/14 0941     ALLERGIES   No known allergies     REVIEW OF SYSTEMS   Review of Systems  Constitutional: Negative for fever, chills and weight loss.  HENT: Negative for hearing loss.   Eyes: Negative for blurred vision, double vision  and photophobia.  Respiratory: Negative for cough, shortness of breath and wheezing.   Cardiovascular: Negative for chest pain.  Gastrointestinal: Negative for heartburn, nausea, vomiting and abdominal pain.  Genitourinary: Negative for flank pain.  Musculoskeletal: Negative.   Skin: Negative for rash.  Neurological: Negative for dizziness, tingling, tremors and headaches.  Endo/Heme/Allergies: Negative.   Psychiatric/Behavioral: Negative.  Negative for depression.  All other systems reviewed and are negative.    VS: BP 113/84 mmHg  Pulse 81  Temp(Src) 97.8 F (36.6 C) (Axillary)  Resp 21  Ht 5\' 3"  (1.6 m)  Wt 81 lb 2.1 oz (36.8 kg)  BMI 14.38 kg/m2  SpO2 93%     PHYSICAL EXAM    Physical Exam  Constitutional: She is oriented to person, place, and time. No distress.  HENT:  Head: Normocephalic and atraumatic.  Mouth/Throat: No oropharyngeal exudate.  Eyes: EOM are normal. Pupils are equal, round, and reactive to light. No scleral icterus.  Neck: Normal range of motion. Neck supple.  Cardiovascular: Normal rate, regular rhythm and normal heart sounds.   No murmur heard. Pulmonary/Chest: No stridor. No respiratory distress. She has no wheezes. She has rales.  Abdominal: Soft. Bowel sounds are normal. She exhibits no distension. There is no tenderness. There is no rebound.  Musculoskeletal: Normal range of motion. She exhibits no edema.  Neurological: She is alert and oriented to person, place, and time. She displays normal reflexes. Coordination normal.  Skin: Skin is warm. She is not diaphoretic.  Psychiatric: She has a normal mood and affect.        LABS    Recent Labs     12/08/14  0353  12/09/14  0616  12/10/14  0300  HGB  13.8  14.2  13.4  HCT  42.0  42.9  41.2  MCV  84.3  84.1  84.1  WBC  16.8*  17.1*  19.2*  BUN  55*  46*  38*  CREATININE  0.89  0.76  0.72  GLUCOSE  126*  120*  85  CALCIUM  9.1  9.1  8.6*  ,    No results for input(s): PH in the last 72 hours.  Invalid input(s): PCO2, PO2, BASEEXCESS, BASEDEFICITE, TFT    CULTURE RESULTS   Recent Results (from the past 240 hour(s))  Culture, blood (routine x 2)     Status: None   Collection Time: 12/04/14  9:35 PM  Result Value Ref Range Status   Specimen Description BLOOD  Final   Special Requests Normal  Final   Culture NO GROWTH 5 DAYS  Final   Report Status 12/09/2014 FINAL  Final  Blood culture (routine x 2)     Status: None (Preliminary result)   Collection Time: 12/04/14  9:35 PM  Result Value Ref Range Status   Specimen Description BLOOD  Final   Special Requests Normal  Final   Culture NO GROWTH 4 DAYS  Final   Report Status PENDING  Incomplete  Urine culture      Status: None   Collection Time: 12/04/14 10:30 PM  Result Value Ref Range Status   Specimen Description URINE, CLEAN CATCH  Final   Special Requests NONE  Final   Culture NO GROWTH 2 DAYS  Final   Report Status 12/06/2014 FINAL  Final  MRSA PCR Screening     Status: None   Collection Time: 12/05/14  9:34 AM  Result Value Ref Range Status   MRSA by PCR NEGATIVE NEGATIVE Final  Comment:        The GeneXpert MRSA Assay (FDA approved for NASAL specimens only), is one component of a comprehensive MRSA colonization surveillance program. It is not intended to diagnose MRSA infection nor to guide or monitor treatment for MRSA infections.   C difficile quick scan w PCR reflex [facility])     Status: None   Collection Time: 12/06/14  4:02 PM  Result Value Ref Range Status   C Diff antigen NEGATIVE  Final   C Diff toxin NEGATIVE  Final   C Diff interpretation Negative for C. difficile  Final          IMAGING       ASSESSMENT/PLAN   79 yo white female admitted to ICU for acute resp failure from acute viral pneumonia with acute COPD exacerbation  1.septic shock -resolved 2.continue oral prednisone 3.albuterol nebs and pulmicort nebs and spiriva 4.continue abx per ID 5.d/c foley catheter 6.out of bed to chair 7.oxygen as needed 88-92%  Dr. Raul Del to follow up if needed    I have personally obtained a history, examined the patient, evaluated laboratory and imaging results, formulated the assessment and plan and placed orders.  The Patient requires high complexity decision making for assessment and support, frequent evaluation and titration of therapies, application of advanced monitoring technologies and extensive interpretation of multiple databases.   Time spent with patient 25 mins  Corrin Parker, M.D. Pul[facility] Director Intensive Care Unit

## 2014-12-10 NOTE — Progress Notes (Signed)
Patient is alert and oriented. Reporting no pain. Tolerating HFNC at 50% with oxygen saturation WNL, goal >90% per [place]. Up to chair x 1 assist. BM in BSC x 1. Foley removed at 1030am, no void at this time. Resting quietly between care. Will transfer to room 244, report called to [place].

## 2014-12-10 NOTE — Progress Notes (Signed)
Patient ID: Veronica Wang, female   DOB: 1935/11/11, 79 y.o.   MRN: [id number]   SUBJECTIVE:  Patient admitted with bilateral lower pneumonia and sepsis with acute on chronic resp failure.  Chronically on 2-3L, with intermittent compliance. Breathing about the same; remains on HFNC. Cough may be slightly better; still having trouble getting sputum out.  No chest pain, fever, chills.  Feels well overall.  Bowels improved.   ______________________________________________________________________  ROS: Please see HPI; remainder of complete 10 point ROS is negative  . albuterol  2.5 mg Nebulization Q4H  . antiseptic oral rinse  7 mL Mouth Rinse BID  . aspirin EC  81 mg Oral Daily  . azithromycin  250 mg Oral Daily  . budesonide (PULMICORT) nebulizer solution  0.5 mg Nebulization BID  . cefTRIAXone (ROCEPHIN)  IV  1 g Intravenous Q24H  . cholecalciferol  2,000 Units Oral Daily  . feeding supplement (ENSURE ENLIVE)  237 mL Oral TID WC  . gabapentin  1,200 mg Oral TID  . heparin  5,000 Units Subcutaneous 3 times per day  . multivitamin with minerals  1 tablet Oral Daily  . pantoprazole  40 mg Oral Daily  . predniSONE  40 mg Oral Q breakfast  . tiotropium  18 mcg Inhalation Daily   acetaminophen **OR** acetaminophen, benzocaine, loperamide, ondansetron **OR** ondansetron (ZOFRAN) IV   Past Medical History  Diagnosis Date  . Hypertension   . GERD (gastroesophageal reflux disease)   . Cancer     Liver  . Cancer     stomach  . FH: chemotherapy   . Radiation   . COPD (chronic obstructive pulmonary disease)   . CHF (congestive heart failure)     Past Surgical History  Procedure Laterality Date  . Colon resection    . Cataract extraction  splenectomy  . Colon surgery      PHYSICAL EXAM:  BP 108/66 mmHg  Pulse 77  Temp(Src) 97.8 F (36.6 C) (Axillary)  Resp 14  Ht 5\' 3"  (1.6 m)  Wt 36.8 kg (81 lb 2.1 oz)  BMI 14.38 kg/m2  SpO2 92% on HFNC  Constitutional: thin female,  in NAD HEENT: PERRL; EOMI; OP moist w/o lesions Neck: supple, no thyromegaly Chest: normal to palpation Respiratory: scattered rhonchi with reduced airflow; no wheeze or retractions Cardiovascular: RRR, no murmur, no gallop; distal pulses 2+ Abdomen: soft, positive BS, no tenderness or rebound Extremities: no cyanosis or edema Neuro: alert, no focal motor or sensory deficits Derm: no sig rashes or nodules Lymph: no cervical or supraclavicular lymphadenopathy  ASSESSMENT/PLAN:  Labs and imaging studies were reviewed  1. Septic shock/bilateral pneumonia/acute on chronic respiratory failure- appreciate ID input; off vanc; cont rocephin/zithromax.  Convert to PO steroids and wean as able, watching leukocytosis. Wean oxygen if able, as this is what's keeping her in CCU 2. COPD with acute exacerbation- cont current therapy 3. Chronic left side systolic CHF-off diuretics; following fluid status 4. Acute renal failure- improving, with BUN trending down; holding lasix 5. A. fib- has converted back to NSR; following 6. Diarrhea-C. 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  Last octreotide several weeks ago. Symptomatically improved 7. Moderate protein/calorie malnutrition- appreciate dietary consult; encourage PO intake

## 2014-12-11 NOTE — Progress Notes (Signed)
Date of Admission:  12/04/2014     ID: Veronica Wang is a 79 y.o. female with  pna  Principal Problem:   Pneumonia Active Problems:   Chronic systolic heart failure   Carcinoid tumor of intestine   Acute respiratory failure with hypoxia   Septic shock   COPD (chronic obstructive pulmonary disease)   Malnutrition of moderate degree   Subjective: Out of unit, weaning O2, no fevers  Medications:  . albuterol  2.5 mg Nebulization QID  . antiseptic oral rinse  7 mL Mouth Rinse BID  . aspirin EC  81 mg Oral Daily  . azithromycin  250 mg Oral Daily  . budesonide (PULMICORT) nebulizer solution  0.5 mg Nebulization BID  . cefTRIAXone (ROCEPHIN)  IV  1 g Intravenous Q24H  . cholecalciferol  2,000 Units Oral Daily  . feeding supplement (ENSURE ENLIVE)  237 mL Oral TID WC  . gabapentin  1,200 mg Oral TID  . heparin  5,000 Units Subcutaneous 3 times per day  . multivitamin with minerals  1 tablet Oral Daily  . pantoprazole  40 mg Oral Daily  . predniSONE  40 mg Oral Q breakfast  . tiotropium  18 mcg Inhalation Daily    Objective: Vital signs in last 24 hours: Temp:  [97.9 F (36.6 C)-98.3 F (36.8 C)] 98 F (36.7 C) (06/03 1145) Pulse Rate:  [79-83] 79 (06/03 1145) Resp:  [16-20] 20 (06/03 1145) BP: (97-106)/(47-64) 97/64 mmHg (06/03 1145) SpO2:  [90 %-93 %] 90 % (06/03 1237) FiO2 (%):  [46 %-50 %] 49 % (06/03 1237) Weight:  [35.471 kg (78 lb 3.2 oz)] 35.471 kg (78 lb 3.2 oz) (06/03 0447) Constitutional: thin female, in NAD HEENT: PERRL; EOMI; OP moist w/o lesions Neck: supple, no thyromegaly Chest: normal to palpation Respiratory: scattered rhonchi with reduced airflow; no wheeze or retractions Cardiovascular: RRR, no murmur, no gallop; distal pulses 2+ Abdomen: soft, positive BS, no tenderness or rebound Extremities: no cyanosis or edema Neuro: alert, no focal motor or sensory deficits Derm: no sig rashes or nodules Lymph: no cervical or supraclavicular  lymphadenopathy  Lab Results  Recent Labs  12/09/14 0616 12/10/14 0300  WBC 17.1* 19.2*  HGB 14.2 13.4  HCT 42.9 41.2  NA 142 142  K 4.7 4.8  CL 99* 99*  CO2 36* 37*  BUN 46* 38*  CREATININE 0.76 0.72   Microbiology: Results for orders placed or performed during the hospital encounter of 12/04/14  Culture, blood (routine x 2)     Status: None   Collection Time: 12/04/14  9:35 PM  Result Value Ref Range Status   Specimen Description BLOOD  Final   Special Requests Normal  Final   Culture NO GROWTH 5 DAYS  Final   Report Status 12/09/2014 FINAL  Final  Blood culture (routine x 2)     Status: None   Collection Time: 12/04/14  9:35 PM  Result Value Ref Range Status   Specimen Description BLOOD  Final   Special Requests Normal  Final   Culture NO GROWTH 5 DAYS  Final   Report Status 12/10/2014 FINAL  Final  Urine culture     Status: None   Collection Time: 12/04/14 10:30 PM  Result Value Ref Range Status   Specimen Description URINE, CLEAN CATCH  Final   Special Requests NONE  Final   Culture NO GROWTH 2 DAYS  Final   Report Status 12/06/2014 FINAL  Final  MRSA PCR Screening  Status: None   Collection Time: 12/05/14  9:34 AM  Result Value Ref Range Status   MRSA by PCR NEGATIVE NEGATIVE Final    Comment:        The GeneXpert MRSA Assay (FDA approved for NASAL specimens only), is one component of a comprehensive MRSA colonization surveillance program. It is not intended to diagnose MRSA infection nor to guide or monitor treatment for MRSA infections.   C difficile quick scan w PCR reflex [facility])     Status: None   Collection Time: 12/06/14  4:02 PM  Result Value Ref Range Status   C Diff antigen NEGATIVE  Final   C Diff toxin NEGATIVE  Final   C Diff interpretation Negative for C. difficile  Final    Studies/Results: Dg Chest 1 View  12/08/2014   CLINICAL DATA:  Bilateral lower lobe pneumonia and sepsis. Acute on chronic respiratory failure.  EXAM: CHEST   1 VIEW  COMPARISON:  One-view chest x-ray 12/07/2014.  FINDINGS: The heart is still enlarged. Interstitial edema is slightly improved. Bibasilar airspace opacities persist. Small effusions are suggested, slightly improved. The soft tissues and bony thorax are unremarkable.  IMPRESSION: 1. Slightly improved interstitial edema and bilateral effusions. 2. Persistent bibasilar airspace disease concerning for pneumonia.   Electronically Signed   By: San Morelle M.D.   On: 12/08/2014 08:45   Dg Chest 1 View  12/04/2014   CLINICAL DATA:  Difficulty breathing with hypoxia  EXAM: CHEST  1 VIEW  COMPARISON:  06/12/2014  FINDINGS: Cardiac shadow is within normal limits. Diffuse infiltrative density is noted projecting in the right lower lobe. Minimal left basilar changes are noted as well. Mild vascular congestion and interstitial edema is noted.  IMPRESSION: Bilateral lower lobe infiltrates superimposed over vascular congestion.   Electronically Signed   By: Inez Catalina M.D.   On: 12/04/2014 21:41   Dg Chest Port 1 View  12/08/2014   CLINICAL DATA:  PICC line placement, history hypertension, carcinoid tumor, COPD, CHF  EXAM: PORTABLE CHEST - 1 VIEW  COMPARISON:  Portable exam 1758 hours compared to 12/08/2014  FINDINGS: Tip of RIGHT arm PICC line projects over cavoatrial junction.  Enlargement of cardiac silhouette.  Mediastinal contours and pulmonary vascularity normal.  Emphysematous changes with RIGHT lower lobe infiltrate and minimal RIGHT basilar atelectasis.  Additional atelectasis versus consolidation of LEFT lower lobe.  No pleural effusion or pneumothorax.  IMPRESSION: BILATERAL lower lobe atelectasis versus consolidation.  Tip of RIGHT arm PICC line now projects over cavoatrial junction.   Electronically Signed   By: Lavonia Dana M.D.   On: 12/08/2014 18:07   Dg Chest Port 1 View  12/08/2014   CLINICAL DATA:  PICC line placement.  EXAM: PORTABLE CHEST - 1 VIEW  COMPARISON:  12/08/2014  FINDINGS: A  right-sided PICC line is noted with tip overlying the upper right atrium. Consider 1-2 cm retraction.  Interstitial edema, bibasilar atelectasis and left lower lung consolidation/atelectasis again noted.  There is no evidence of pneumothorax.  IMPRESSION: Right PICC line with tip overlying the upper right atrium -consider 1-2 cm retraction.   Electronically Signed   By: Margarette Canada M.D.   On: 12/08/2014 17:55   Dg Chest Port 1 View  12/07/2014   CLINICAL DATA:  Bilateral pneumonia and septic shock  EXAM: PORTABLE CHEST - 1 VIEW  COMPARISON:  12/04/2014  FINDINGS: Normal heart size. There are bilateral pleural effusions and mild interstitial edema consistent with CHF. Right midlung and right base  airspace disease is identified consistent with pneumonia.  IMPRESSION: 1. CHF with superimposed right lung pneumonia.   Electronically Signed   By: Kerby Moors M.D.   On: 12/07/2014 10:39   Mm Digital Screening Bilateral  11/24/2014   CLINICAL DATA:  Screening.  EXAM: DIGITAL SCREENING BILATERAL MAMMOGRAM WITH CAD  COMPARISON:  Previous exam(s).  ACR Breast Density Category b: There are scattered areas of fibroglandular density.  FINDINGS: There are no findings suspicious for malignancy. Images were processed with CAD.  IMPRESSION: No mammographic evidence of malignancy. A result letter of this screening mammogram will be mailed directly to the patient.  RECOMMENDATION: Screening mammogram in one year. (Code:SM-B-01Y)  BI-RADS CATEGORY  1: Negative.   Electronically Signed   By: Lajean Manes M.D.   On: 11/24/2014 12:32    Assessment/Plan: Veronica Wang is a 79 y.o. female with hx of COPD, CHF admitted 5/28 with increased resp distress. Per the patient she has had mild sob cough for 2 weeks and took an abx about 2 weeks ago. Then was visiting her sister who was caring for her grandchild. Following that visit she felt ill with weakness and resp distress. Has not had a productive cough. CXR on admit  showed infiltrate bil bases. Admitted to ICU on vanco zosyn and azithromycin initially but then zosyn changed to ceftriaxone. Also on steroids. Of note has been afebrile. WBC was 10 on admission and has increased to 20 likely due to steroids. BCX neg, Sputum cx has been unable to be collected. Currently main issue is dry cough and diff to wean O2. She does have CHF EF 45-50% Her sister was also admitted with PNA but is improving. Flu negative PCR I suspect possible viral pathogen as participating cause for her illness. She has a very dry cough and did not have leukocytosis on admission. Clinically no fevers and main issue seems to be oxygenation likely multifactorial Negative urine legionella Recommendations Continue ceftraixone and azithromycin- Day 7 of treatment - if all cultures remain negative would dc after 10 days and monitor for worsening If worsens would CT chest and consider bronch Diuresis as needed. Cont nebs and copd treatment. Thank you very much for allowing me to participate in the care of this patient. Please call with questions  Wang, Veronica Wisz   12/11/2014, 2:19 PM

## 2014-12-11 NOTE — Care Management (Signed)
Currently no order for physical therapy.  Have paged Dr Gilford Rile who is covering for today and discussed with primary nurse .  Patient says that she has two other sisters- one is currently in the hospital with  Pneumonia.  Says that the third sister has been discussing taking both of her sisters to her home and caring for them while they recuperate.  Patient would be agreeable to home health nursing and physical therapy. Shewould consider skilled nursing only as a last resort.

## 2014-12-11 NOTE — Progress Notes (Signed)
Nutrition Follow-up  DOCUMENTATION CODES:  Non-severe (moderate) malnutrition in context of chronic illness  INTERVENTION: Continue:  (Medical Nutrition Supplement: Recommend continuing ensure enlive TID and magic cup q daily for added nutrition)  Meals and Snacks: Cater to patient preferences   NUTRITION DIAGNOSIS:  Inadequate oral intake related to acute illness as evidenced by meal completion < 25%, improving with meals/ supplements  GOAL:  Patient will meet greater than or equal to 90% of their needs  MONITOR:   (Energy Intake, Electrolyte and renal Profile, Pulmonary Profile)  REASON FOR ASSESSMENT:   (RD Follow Up)    ASSESSMENT:  Clinical Update: Intake improved x 24 hrs; avereage intake of Ensure is >2 per day Typical Food/ Fluid Intake: 100% of meals recorded per I/O last 24 hrs Weight Changes: some weight fluctuations during admission; BMI low at 15.4 Labs:  Electrolyte and Renal Profile:  Recent Labs Lab 12/08/14 0353 12/09/14 0616 12/10/14 0300  BUN 55* 46* 38*  CREATININE 0.89 0.76 0.72  NA 143 142 142  K 4.7 4.7 4.8  MG  --  2.0  --   PHOS  --  4.5  --    Protein Profile:  Recent Labs Lab 12/04/14 2137 12/08/14 0353  ALBUMIN 3.5 2.3*   Glucose Profile:  Recent Labs  12/10/14 1619  GLUCAP 123*   Meds: Vit D, MVI Physical Findings: n/a  Height:  Ht Readings from Last 1 Encounters:  12/04/14 5\' 3"  (1.6 m)    Weight:  Wt Readings from Last 1 Encounters:  12/11/14 78 lb 3.2 oz (35.471 kg)    Ideal Body Weight:     Wt Readings from Last 10 Encounters:  12/11/14 78 lb 3.2 oz (35.471 kg)  06/26/14 83 lb (37.649 kg)    BMI:  Body mass index is 13.86 kg/(m^2).  Estimated Nutritional Needs:  Kcal:  1296-1532kcals, BEE: 982kcals, TEE: (IF 1.1-1.3)(AF 1.2) using IBW of 52.3kg  Protein:  52-63g protein (1.0-1.2g/kg) using IBW of 52.3kg  Fluid:  1308-1562mL of fluid (25-5mL/kg) using IBW of 52.3kg  Skin:  Reviewed, no  issues  Diet Order:  Diet Heart Room service appropriate?: Yes; Fluid consistency:: Thin  EDUCATION NEEDS:  Education needs no appropriate at this time   Intake/Output Summary (Last 24 hours) at 12/11/14 1431 Last data filed at 12/11/14 1057  Gross per 24 hour  Intake    290 ml  Output    875 ml  Net   -585 ml    Last BM:  6/1  Roda Shutters, RDN Pager: [phone] Office: [place]

## 2014-12-11 NOTE — Progress Notes (Signed)
Patient ID: Veronica Wang, female   DOB: [birth year], 79 y.o.   MRN: [id number] Patient ID: Veronica Wang, female   DOB: Nov 15, 1935, 79 y.o.   MRN: [id number]   SUBJECTIVE:  Patient admitted with bilateral lower pneumonia and sepsis with acute on chronic resp failure.  Chronically on 2-3L, with intermittent compliance. Breathing about the same; remains on HFNC. Down to 55% FIO2. On telemetry.  ______________________________________________________________________  ROS: Please see HPI; remainder of complete 10 point ROS is negative  . albuterol  2.5 mg Nebulization QID  . antiseptic oral rinse  7 mL Mouth Rinse BID  . aspirin EC  81 mg Oral Daily  . azithromycin  250 mg Oral Daily  . budesonide (PULMICORT) nebulizer solution  0.5 mg Nebulization BID  . cefTRIAXone (ROCEPHIN)  IV  1 g Intravenous Q24H  . cholecalciferol  2,000 Units Oral Daily  . feeding supplement (ENSURE ENLIVE)  237 mL Oral TID WC  . gabapentin  1,200 mg Oral TID  . heparin  5,000 Units Subcutaneous 3 times per day  . multivitamin with minerals  1 tablet Oral Daily  . pantoprazole  40 mg Oral Daily  . predniSONE  40 mg Oral Q breakfast  . tiotropium  18 mcg Inhalation Daily   acetaminophen **OR** acetaminophen, benzocaine, loperamide, ondansetron **OR** ondansetron (ZOFRAN) IV   Past Medical History  Diagnosis Date  . Hypertension   . GERD (gastroesophageal reflux disease)   . Cancer     Liver  . Cancer     stomach  . FH: chemotherapy   . Radiation   . COPD (chronic obstructive pulmonary disease)   . CHF (congestive heart failure)     Past Surgical History  Procedure Laterality Date  . Colon resection    . Cataract extraction  splenectomy  . Colon surgery      PHYSICAL EXAM:  BP 104/47 mmHg  Pulse 79  Temp(Src) 97.9 F (36.6 C) (Oral)  Resp 16  Ht 5\' 3"  (1.6 m)  Wt 35.471 kg (78 lb 3.2 oz)  BMI 13.86 kg/m2  SpO2 93% on HFNC  Constitutional: thin female, in NAD HEENT: PERRL; EOMI;  OP moist w/o lesions Neck: supple, no thyromegaly Chest: normal to palpation Respiratory: scattered rhonchi with reduced airflow; no wheeze or retractions Cardiovascular: RRR, no murmur, no gallop; distal pulses 2+ Abdomen: soft, positive BS, no tenderness or rebound Extremities: no cyanosis or edema Neuro: alert, no focal motor or sensory deficits Derm: no sig rashes or nodules Lymph: no cervical or supraclavicular lymphadenopathy  ASSESSMENT/PLAN:  Labs and imaging studies were reviewed  1. Septic shock/bilateral pneumonia/acute on chronic respiratory failure-  cont rocephin/zithromax.  Convert to PO steroids and wean as able, watching leukocytosis. Wean oxygen if able, as this is what's keeping her in hospital. 2. COPD with acute exacerbation- cont current therapy 3. Chronic left side systolic CHF-off diuretics; following fluid status 4. Acute renal failure- improving, with BUN trending down; holding lasix 5. A. fib- has converted back to NSR; following 6. Diarrhea-C. 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  Last octreotide several weeks ago. Symptomatically improved 7. Moderate protein/calorie malnutrition- appreciate dietary consult; encourage PO intake

## 2014-12-11 NOTE — Progress Notes (Signed)
Pt. Alert and oriented. VSS. No c/o pain. Pills whole with water. Running SR per telemetry monitor. Using bed pan. On high flow oxygen. Resting quietly.

## 2014-12-12 ENCOUNTER — Inpatient Hospital Stay: Payer: Medicare Other

## 2014-12-12 LAB — CBC WITH DIFFERENTIAL/PLATELET
Basophils Absolute: 0 10*3/uL (ref 0–0.1)
Basophils Relative: 0 %
EOS ABS: 0 10*3/uL (ref 0–0.7)
Eosinophils Relative: 0 %
HEMATOCRIT: 39.5 % (ref 35.0–47.0)
Hemoglobin: 13.1 g/dL (ref 12.0–16.0)
LYMPHS PCT: 11 %
Lymphs Abs: 2.4 10*3/uL (ref 1.0–3.6)
MCH: 27.7 pg (ref 26.0–34.0)
MCHC: 33.2 g/dL (ref 32.0–36.0)
MCV: 83.6 fL (ref 80.0–100.0)
MONO ABS: 0.2 10*3/uL (ref 0.2–0.9)
MONOS PCT: 1 %
NEUTROS PCT: 88 %
Neutro Abs: 19.4 10*3/uL — ABNORMAL HIGH (ref 1.4–6.5)
Platelets: 236 10*3/uL (ref 150–440)
RBC: 4.72 MIL/uL (ref 3.80–5.20)
RDW: 14.5 % (ref 11.5–14.5)
WBC: 22 10*3/uL — AB (ref 3.6–11.0)

## 2014-12-12 LAB — BASIC METABOLIC PANEL
Anion gap: 6 (ref 5–15)
BUN: 30 mg/dL — AB (ref 6–20)
CALCIUM: 8.3 mg/dL — AB (ref 8.9–10.3)
CHLORIDE: 96 mmol/L — AB (ref 101–111)
CO2: 35 mmol/L — ABNORMAL HIGH (ref 22–32)
Creatinine, Ser: 0.74 mg/dL (ref 0.44–1.00)
GFR calc Af Amer: >60 mL/min (ref >60)
GLUCOSE: 81 mg/dL (ref 65–99)
POTASSIUM: 4.4 mmol/L (ref 3.5–5.1)
Sodium: 137 mmol/L (ref 135–145)

## 2014-12-12 NOTE — Progress Notes (Signed)
Patient ID: Veronica Wang, female   DOB: Jun 14, 1936, 79 y.o.   MRN: [id number] Patient ID: Veronica Wang, female   DOB: 02/17/1936, 79 y.o.   MRN: [id number] Patient ID: Veronica Wang, female   DOB: Dec 01, 1935, 79 y.o.   MRN: [id number]   SUBJECTIVE:  Patient admitted with bilateral lower pneumonia and sepsis with acute on chronic resp failure.  Chronically on 2-3L, with intermittent compliance. Breathing about the same; remains on HFNC. Down to 40% FIO2. On telemetry. ID note from 12/11/14 reviewed.  ______________________________________________________________________  ROS: Please see HPI; remainder of complete 10 point ROS is negative  . albuterol  2.5 mg Nebulization QID  . antiseptic oral rinse  7 mL Mouth Rinse BID  . aspirin EC  81 mg Oral Daily  . azithromycin  250 mg Oral Daily  . budesonide (PULMICORT) nebulizer solution  0.5 mg Nebulization BID  . cefTRIAXone (ROCEPHIN)  IV  1 g Intravenous Q24H  . cholecalciferol  2,000 Units Oral Daily  . feeding supplement (ENSURE ENLIVE)  237 mL Oral TID WC  . gabapentin  1,200 mg Oral TID  . heparin  5,000 Units Subcutaneous 3 times per day  . multivitamin with minerals  1 tablet Oral Daily  . pantoprazole  40 mg Oral Daily  . predniSONE  40 mg Oral Q breakfast  . tiotropium  18 mcg Inhalation Daily   acetaminophen **OR** acetaminophen, benzocaine, loperamide, ondansetron **OR** ondansetron (ZOFRAN) IV   Past Medical History  Diagnosis Date  . Hypertension   . GERD (gastroesophageal reflux disease)   . Cancer     Liver  . Cancer     stomach  . FH: chemotherapy   . Radiation   . COPD (chronic obstructive pulmonary disease)   . CHF (congestive heart failure)     Past Surgical History  Procedure Laterality Date  . Colon resection    . Cataract extraction  splenectomy  . Colon surgery      PHYSICAL EXAM:  BP 107/57 mmHg  Pulse 62  Temp(Src) 97.5 F (36.4 C) (Oral)  Resp 22  Ht 5\' 3"  (1.6 m)  Wt 37.603 kg  (82 lb 14.4 oz)  BMI 14.69 kg/m2  SpO2 93% on HFNC  Constitutional: thin female, in NAD HEENT: PERRL; EOMI; OP moist w/o lesions Neck: supple, no thyromegaly Chest: normal to palpation Respiratory: scattered rhonchi with reduced airflow; no wheeze or retractions Cardiovascular: RRR, no murmur, no gallop; distal pulses 2+ Abdomen: soft, positive BS, no tenderness or rebound Extremities: no cyanosis or edema Neuro: alert, no focal motor or sensory deficits Derm: no sig rashes or nodules Lymph: no cervical or supraclavicular lymphadenopathy  ASSESSMENT/PLAN:  Labs and imaging studies were reviewed  1. Septic shock/bilateral pneumonia/acute on chronic respiratory failure-  cont rocephin/zithromax.  On prednisone 40 mg daily. Leukocytosis may be due to steroids. 2. COPD with acute exacerbation- cont current therapy 3. Chronic left side systolic CHF-off diuretics; following fluid status 4. Acute renal failure- improving, with BUN trending down; holding lasix 5. A. fib- has converted back to NSR; following 6. Diarrhea-C. 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  Last octreotide several weeks ago. Symptomatically improved 7. Moderate protein/calorie malnutrition- appreciate dietary consult; encourage PO intake

## 2014-12-13 LAB — CBC WITH DIFFERENTIAL/PLATELET
BASOS PCT: 1 %
Basophils Absolute: 0.2 10*3/uL — ABNORMAL HIGH (ref 0–0.1)
EOS ABS: 0.2 10*3/uL (ref 0–0.7)
EOS PCT: 1 %
HCT: 41.1 % (ref 35.0–47.0)
Hemoglobin: 13.6 g/dL (ref 12.0–16.0)
Lymphocytes Relative: 12 %
Lymphs Abs: 2.5 10*3/uL (ref 1.0–3.6)
MCH: 27.5 pg (ref 26.0–34.0)
MCHC: 33.2 g/dL (ref 32.0–36.0)
MCV: 82.9 fL (ref 80.0–100.0)
MONOS PCT: 3 %
Monocytes Absolute: 0.6 10*3/uL (ref 0.2–0.9)
NEUTROS ABS: 17 10*3/uL — AB (ref 1.4–6.5)
Neutrophils Relative %: 83 %
Platelets: 295 10*3/uL (ref 150–440)
RBC: 4.95 MIL/uL (ref 3.80–5.20)
RDW: 15.1 % — AB (ref 11.5–14.5)
WBC: 20.5 10*3/uL — AB (ref 3.6–11.0)

## 2014-12-13 MED ORDER — ALBUTEROL SULFATE (2.5 MG/3ML) 0.083% IN NEBU
2.5000 mg | INHALATION_SOLUTION | Freq: Three times a day (TID) | RESPIRATORY_TRACT | Status: DC
  Administered 2014-12-13 – 2014-12-15 (×7): 2.5 mg via RESPIRATORY_TRACT
  Filled 2014-12-13 (×7): qty 3

## 2014-12-13 NOTE — Plan of Care (Signed)
Problem: Phase I Progression Outcomes Goal: Other Phase II Outcomes/Goals Outcome: Progressing Pt is alert and oriented x 4, continues on hfnc, pt mobile in bed, using bedpan with minimal assist, 1 liquid stool improved with imodium, fair appetite, denies pain, vital signs remain stable. Receiving IV antibiotics for pne.

## 2014-12-13 NOTE — Progress Notes (Signed)
Labs collected from PICC line on RUE per hospital protocol. Pt tolerated well.

## 2014-12-13 NOTE — Progress Notes (Signed)
Patient ID: Veronica Wang, female   DOB: 02-27-1936, 79 y.o.   MRN: [id number] Patient ID: Veronica Wang, female   DOB: September 12, 1935, 79 y.o.   MRN: [id number] Patient ID: Veronica Wang, female   DOB: 1936/04/23, 79 y.o.   MRN: [id number] Patient ID: Veronica Wang, female   DOB: 03-18-1936, 79 y.o.   MRN: [id number]   SUBJECTIVE:  Patient admitted with bilateral lower pneumonia and sepsis with acute on chronic resp failure.  Chronically on 2-3L, with intermittent compliance at home. Breathing about the same; remains on HFNC. Still on 40% FIO2. On telemetry. WBC remains elevated but unchanged.   ______________________________________________________________________  ROS: Please see HPI; remainder of complete 10 point ROS is negative  . albuterol  2.5 mg Nebulization QID  . antiseptic oral rinse  7 mL Mouth Rinse BID  . aspirin EC  81 mg Oral Daily  . azithromycin  250 mg Oral Daily  . budesonide (PULMICORT) nebulizer solution  0.5 mg Nebulization BID  . cefTRIAXone (ROCEPHIN)  IV  1 g Intravenous Q24H  . cholecalciferol  2,000 Units Oral Daily  . gabapentin  1,200 mg Oral TID  . heparin  5,000 Units Subcutaneous 3 times per day  . multivitamin with minerals  1 tablet Oral Daily  . pantoprazole  40 mg Oral Daily  . predniSONE  40 mg Oral Q breakfast  . tiotropium  18 mcg Inhalation Daily   acetaminophen **OR** acetaminophen, benzocaine, loperamide, ondansetron **OR** ondansetron (ZOFRAN) IV   Past Medical History  Diagnosis Date  . Hypertension   . GERD (gastroesophageal reflux disease)   . Cancer     Liver  . Cancer     stomach  . FH: chemotherapy   . Radiation   . COPD (chronic obstructive pulmonary disease)   . CHF (congestive heart failure)     Past Surgical History  Procedure Laterality Date  . Colon resection    . Cataract extraction  splenectomy  . Colon surgery      PHYSICAL EXAM:  BP 107/55 mmHg  Pulse 89  Temp(Src) 98.6 F (37 C) (Oral)  Resp  19  Ht 5\' 3"  (1.6 m)  Wt 37.603 kg (82 lb 14.4 oz)  BMI 14.69 kg/m2  SpO2 90% on HFNC  Constitutional: thin female, in NAD HEENT: PERRL; EOMI; OP moist w/o lesions Neck: supple, no thyromegaly Chest: normal to palpation Respiratory: crackles with reduced airflow; no wheeze or retractions Cardiovascular: RRR, no murmur, no gallop; distal pulses 2+ Abdomen: soft, positive BS, no tenderness or rebound Extremities: no cyanosis or edema Neuro: alert, no focal motor or sensory deficits Derm: no sig rashes or nodules Lymph: no cervical or supraclavicular lymphadenopathy  ASSESSMENT/PLAN:  Labs and imaging studies were reviewed  1. Septic shock/bilateral pneumonia/acute on chronic respiratory failure-  cont rocephin/zithromax.  On prednisone 40 mg daily. Leukocytosis probably due yet due to steroids. Pulmonary has not followed up yet. May need placement for long O2 wean. 2. COPD with acute exacerbation- cont current therapy 3. Chronic left side systolic CHF-off diuretics; following fluid status 4. Acute renal failure- improving, with BUN trending down; holding lasix 5. A. fib- has converted back to NSR; following 6. Diarrhea-C. 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  Last octreotide several weeks ago. Symptomatically improved 7. Moderate protein/calorie malnutrition- appreciate dietary consult; encourage PO intake

## 2014-12-14 LAB — BASIC METABOLIC PANEL
Anion gap: 8 (ref 5–15)
BUN: 22 mg/dL — ABNORMAL HIGH (ref 6–20)
CALCIUM: 8.6 mg/dL — AB (ref 8.9–10.3)
CHLORIDE: 96 mmol/L — AB (ref 101–111)
CO2: 33 mmol/L — ABNORMAL HIGH (ref 22–32)
Creatinine, Ser: 0.79 mg/dL (ref 0.44–1.00)
GFR calc non Af Amer: >60 mL/min (ref >60)
Glucose, Bld: 82 mg/dL (ref 65–99)
Potassium: 4.6 mmol/L (ref 3.5–5.1)
SODIUM: 137 mmol/L (ref 135–145)

## 2014-12-14 LAB — CBC WITH DIFFERENTIAL/PLATELET
BASOS ABS: 0 10*3/uL (ref 0.0–0.1)
Band Neutrophils: 1 % (ref 0–10)
Basophils Relative: 0 % (ref 0–1)
Blasts: 0 %
Eosinophils Absolute: 0 10*3/uL (ref 0.0–0.7)
Eosinophils Relative: 0 % (ref 0–5)
HCT: 42.4 % (ref 35.0–47.0)
HEMOGLOBIN: 13.7 g/dL (ref 12.0–16.0)
Lymphocytes Relative: 11 % — ABNORMAL LOW (ref 12–46)
Lymphs Abs: 1.6 10*3/uL (ref 0.7–4.0)
MCH: 27.2 pg (ref 26.0–34.0)
MCHC: 32.3 g/dL (ref 32.0–36.0)
MCV: 84.1 fL (ref 80.0–100.0)
METAMYELOCYTES PCT: 1 %
MYELOCYTES: 0 %
Monocytes Absolute: 1.6 10*3/uL — ABNORMAL HIGH (ref 0.1–1.0)
Monocytes Relative: 11 % (ref 3–12)
NRBC: 0 /100{WBCs}
Neutro Abs: 11.6 10*3/uL — ABNORMAL HIGH (ref 1.7–7.7)
Neutrophils Relative %: 76 % (ref 43–77)
Other: 0 %
PROMYELOCYTES ABS: 0 %
Platelets: 323 10*3/uL (ref 150–440)
RBC: 5.05 MIL/uL (ref 3.80–5.20)
RDW: 14.8 % — ABNORMAL HIGH (ref 11.5–14.5)
WBC: 14.8 10*3/uL — AB (ref 3.6–11.0)

## 2014-12-14 MED ORDER — LEVOFLOXACIN 250 MG PO TABS
250.0000 mg | ORAL_TABLET | Freq: Every day | ORAL | Status: DC
  Administered 2014-12-14 – 2014-12-15 (×2): 250 mg via ORAL
  Filled 2014-12-14 (×2): qty 1

## 2014-12-14 MED ORDER — PREDNISONE 20 MG PO TABS
20.0000 mg | ORAL_TABLET | Freq: Every day | ORAL | Status: DC
  Administered 2014-12-14: 20 mg via ORAL
  Filled 2014-12-14: qty 1

## 2014-12-14 NOTE — Progress Notes (Signed)
Patient ID: Veronica Wang, female   DOB: 11/30/1935, 79 y.o.   MRN: [id number]   SUBJECTIVE:  Patient admitted with bilateral lower pneumonia and sepsis with acute on chronic resp failure.  Chronically on 2-3L, with intermittent compliance. Clinically improved, though still requires HFNC.  Minimal cough; bowel function seems better, but feels she gets diarrhea after Ensure.  No fever, chills overnight.  ______________________________________________________________________  ROS: Please see HPI; remainder of complete 10 point ROS is negative  . albuterol  2.5 mg Nebulization TID  . antiseptic oral rinse  7 mL Mouth Rinse BID  . aspirin EC  81 mg Oral Daily  . azithromycin  250 mg Oral Daily  . budesonide (PULMICORT) nebulizer solution  0.5 mg Nebulization BID  . cholecalciferol  2,000 Units Oral Daily  . gabapentin  1,200 mg Oral TID  . heparin  5,000 Units Subcutaneous 3 times per day  . multivitamin with minerals  1 tablet Oral Daily  . pantoprazole  40 mg Oral Daily  . predniSONE  20 mg Oral Q breakfast  . tiotropium  18 mcg Inhalation Daily   acetaminophen **OR** acetaminophen, benzocaine, loperamide, ondansetron **OR** ondansetron (ZOFRAN) IV   Past Medical History  Diagnosis Date  . Hypertension   . GERD (gastroesophageal reflux disease)   . Cancer     Liver  . Cancer     stomach  . FH: chemotherapy   . Radiation   . COPD (chronic obstructive pulmonary disease)   . CHF (congestive heart failure)     Past Surgical History  Procedure Laterality Date  . Colon resection    . Cataract extraction  splenectomy  . Colon surgery      PHYSICAL EXAM:  BP 112/62 mmHg  Pulse 74  Temp(Src) 98.4 F (36.9 C) (Oral)  Resp 22  Ht 5\' 3"  (1.6 m)  Wt 35.925 kg (79 lb 3.2 oz)  BMI 14.03 kg/m2  SpO2 92% on HFNC  Constitutional: thin female, in NAD HEENT: PERRL; EOMI; OP moist w/o lesions Neck: supple, no thyromegaly Chest: normal to palpation Respiratory: scattered  rhonchi with poor airflow; no wheeze or retractions Cardiovascular: RRR, no murmur, no gallop; distal pulses 2+ Abdomen: soft, positive BS, no tenderness or rebound Extremities: no cyanosis or edema Neuro: alert, no focal motor or sensory deficits Derm: no sig rashes or nodules Lymph: no cervical or supraclavicular lymphadenopathy  ASSESSMENT/PLAN:  Labs and imaging studies were reviewed  1. Septic shock/bilateral pneumonia/acute on chronic respiratory failure- appreciate ID input;all cultures and studies have been negative.  Convert to PO abx; wean steroids.  Follow CBC.  Trial of converting to standard [place] 2. COPD with acute exacerbation-reducing steroids; cont inhaled meds 3. Chronic left side systolic CHF-off diuretics; following fluid status 4. Acute renal failure- resolved 5. A. fib- remains in NSR; d/c tele 6. Diarrhea-C. 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  Last octreotide several weeks ago.  7. Moderate protein/calorie malnutrition- appreciate dietary consult; encourage PO intake and would avoid Ensure if she feels this is worsening bowel issues

## 2014-12-14 NOTE — Progress Notes (Signed)
Report called to 1C nurse, Junie Panning. Patient's belongings gathered from room and ready to go. Respiratory has been called to put patient on nonrebreather for transport.

## 2014-12-14 NOTE — Care Management (Cosign Needed)
Patient has to be placed back for HFNC when failed trial on regular cannula.  Spoke with patient about LTAC concept.  Spoke with attending.  Patient is agreeable to speak with representatives from [facility] who will meet with patient today.  Would anticipate patient if accepts this as care option she could transfer within the next 24 hours.  The need for HFNC is preventing patient from transfering home or to skilled.  LTAC is the best option for patient at present and will allow her to have a period of stability before discharge home.  This option would greatly decrease the risk of readmission

## 2014-12-14 NOTE — Evaluation (Signed)
Physical Therapy Evaluation Patient Details Name: Veronica Wang MRN: [id number] DOB: [birth year] Today's Date: 12/14/2014   History of Present Illness  Patient was admitted to [facility] after worsening complaints of shortness of breath and fatigue. Patient has been inconsistently using 2-3L of oxygen at home at baseline, though she has been recommended to do so constantly at baseline.   Clinical Impression  Patient is a 79 y/o female that is independent at baseline, but presents today having not been out of bed in roughly 1 week secondary to cardiopulmonary status. Today she is on HFNC and presents with decreased O2 saturation levels with mobility, limiting her out of bed tolerance today. She is able to transfer supine <--> sit with no external support and maintains good sitting balance. She is able to complete MMT in sitting with 5/5 in both UEs and LEs and no loss of balance. She does not become symptomatic with ambulation, though she maintains an O2 saturation level of 82-84 % during ambulation. She presents with decreased functional strength and balance and would benefit from physical therapy after her discharge at [facility] to increase her safety and independence with mobility. Skilled acute PT services are indicated to address the above deficits.     Follow Up Recommendations LTACH    Equipment Recommendations  Rolling walker with 5" wheels    Recommendations for Other Services       Precautions / Restrictions Precautions Precautions: Fall Restrictions Weight Bearing Restrictions: No      Mobility  Bed Mobility Overal bed mobility: Needs Assistance Bed Mobility: Supine to Sit;Sit to Supine     Supine to sit: Supervision Sit to supine: Supervision   General bed mobility comments: Patient displays some generalized weakness, secondary to decreased mobility over the last week.   Transfers Overall transfer level: Needs assistance Equipment used: Rolling walker (2 wheeled) Transfers:  Sit to/from Stand Sit to Stand: Min guard         General transfer comment: Patient displays safe and appropriate technique for sit <--> stand transfer with RW  Ambulation/Gait Ambulation/Gait assistance: Min guard Ambulation Distance (Feet): 10 Feet Assistive device: Rolling walker (2 wheeled) Gait Pattern/deviations: Decreased step length - right;Decreased step length - left     General Gait Details: Patient limited in gait by low O2 saturation levels (82-84%) and HFNC. Patient was not short of breath with ambulation, but did take short steps with RW indicating generalized LE weakness.   Stairs            Wheelchair Mobility    Modified Rankin (Stroke Patients Only)       Balance Overall balance assessment: Needs assistance   Sitting balance-Leahy Scale: Good     Standing balance support: Bilateral upper extremity supported Standing balance-Leahy Scale: Good                               Pertinent Vitals/Pain Pain Assessment: No/denies pain    Home Living Family/patient expects to be discharged to:: Private residence Living Arrangements: Alone   Type of Home: House Home Access: Stairs to enter Entrance Stairs-Rails: Can reach both Entrance Stairs-Number of Steps: 2 Home Layout: One level Home Equipment: None      Prior Function Level of Independence: Independent         Comments: Was driving prior to this admission     Hand Dominance        Extremity/Trunk Assessment   Upper Extremity Assessment:  Overall WFL for tasks assessed           Lower Extremity Assessment: Overall WFL for tasks assessed         Communication   Communication: No difficulties  Cognition Arousal/Alertness: Awake/alert Behavior During Therapy: WFL for tasks assessed/performed Overall Cognitive Status: Within Functional Limits for tasks assessed                      General Comments      Exercises        Assessment/Plan     PT Assessment Patient needs continued PT services  PT Diagnosis Difficulty walking;Generalized weakness   PT Problem List Decreased strength;Decreased balance;Cardiopulmonary status limiting activity;Decreased knowledge of use of DME;Decreased mobility;Decreased activity tolerance  PT Treatment Interventions DME instruction;Therapeutic activities;Gait training;Therapeutic exercise;Balance training;Functional mobility training   PT Goals (Current goals can be found in the Care Plan section) Acute Rehab PT Goals Patient Stated Goal: To go to LTAC to return to as close to her baseline as possible  PT Goal Formulation: With patient/family Time For Goal Achievement: 12/28/14 Potential to Achieve Goals: Good    Frequency Min 2X/week   Barriers to discharge   HFNC in place     Co-evaluation               End of Session Equipment Utilized During Treatment: Gait belt;Oxygen Activity Tolerance: Patient tolerated treatment well;Patient limited by fatigue Patient left: in bed;with bed alarm set;with family/visitor present;with call bell/phone within reach Nurse Communication: Mobility status         Time: 3532-9924 PT Time Calculation (min) (ACUTE ONLY): 16 min   Charges:   PT Evaluation $Initial PT Evaluation Tier I: 1 Procedure     PT G Codes:       Kerman Passey, PT, DPT   12/14/2014, 5:29 PM

## 2014-12-14 NOTE — Care Management (Signed)
Spoke with attending regarding request for physical therapy made on 12/11/2014.  Dr Caryl Comes states that he was concerned regarding patient's ability to participate due to oxygen sats and HFNC.  Gave okay for order for physical therapy

## 2014-12-14 NOTE — Care Management (Signed)
Seth Bake from [facility] here to visit with Ms. Giuffre. States that they are able to take Ms. Child psychotherapist tomorrow, if needed.  Spoke with Santiago Glad from [facility]. States that she is on her way to the hospital to review Ms. Loveridge's case.  Spoke with Ms. Hicklin at the bedside. States she would like her daughter to determine where she goes when discharged.  Spoke with Dr. Caryl Comes. Discharge disposition pending on daughter and Ms. Pemberton's decision. Shelbie Ammons RN MSN Care Management [phone]

## 2014-12-14 NOTE — Plan of Care (Signed)
Pt. Rested peacefully through the night. No episodes of diarrhea occurred. Pt. Is alert and oriented x4. No acute distress observed. No signs or c/o pain noted. VSS. Will continue to monitor.

## 2014-12-15 ENCOUNTER — Ambulatory Visit (HOSPITAL_COMMUNITY)
Admission: AD | Admit: 2014-12-15 | Discharge: 2014-12-15 | Disposition: A | Discharge status: Home / Self Care | Payer: Medicare Other | Source: Other Acute Inpatient Hospital | Attending: Internal Medicine | Admitting: Internal Medicine

## 2014-12-15 ENCOUNTER — Inpatient Hospital Stay
Admission: EM | Admit: 2014-12-15 | Discharge: 2015-01-06 | Disposition: A | Discharge status: Home / Self Care | Payer: Self-pay | Source: Other Acute Inpatient Hospital | Attending: Internal Medicine | Admitting: Internal Medicine

## 2014-12-15 ENCOUNTER — Other Ambulatory Visit (HOSPITAL_COMMUNITY): Payer: Self-pay

## 2014-12-15 DIAGNOSIS — J189 Pneumonia, unspecified organism: Secondary | ICD-10-CM

## 2014-12-15 DIAGNOSIS — J9690 Respiratory failure, unspecified, unspecified whether with hypoxia or hypercapnia: Secondary | ICD-10-CM | POA: Insufficient documentation

## 2014-12-15 LAB — CBC
HCT: 40.2 % (ref 35.0–47.0)
HEMOGLOBIN: 13.1 g/dL (ref 12.0–16.0)
MCH: 27.3 pg (ref 26.0–34.0)
MCHC: 32.5 g/dL (ref 32.0–36.0)
MCV: 84 fL (ref 80.0–100.0)
Platelets: 342 10*3/uL (ref 150–440)
RBC: 4.79 MIL/uL (ref 3.80–5.20)
RDW: 14.8 % — ABNORMAL HIGH (ref 11.5–14.5)
WBC: 13.7 10*3/uL — ABNORMAL HIGH (ref 3.6–11.0)

## 2014-12-15 MED ORDER — ACETAMINOPHEN 325 MG PO TABS: 650.0000 mg | ORAL_TABLET | Freq: Four times a day (QID) | ORAL | Status: DC | PRN

## 2014-12-15 MED ORDER — BUDESONIDE 0.5 MG/2ML IN SUSP: 0.5000 mg | Freq: Two times a day (BID) | RESPIRATORY_TRACT | Status: DC

## 2014-12-15 MED ORDER — HEPARIN SODIUM (PORCINE) 5000 UNIT/ML IJ SOLN: 5000.0000 [IU] | Freq: Three times a day (TID) | INTRAMUSCULAR | Status: DC

## 2014-12-15 MED ORDER — SODIUM CHLORIDE 0.9 % IV BOLUS (SEPSIS)
250.0000 mL | Freq: Once | INTRAVENOUS | Status: AC
  Administered 2014-12-15: 250 mL via INTRAVENOUS

## 2014-12-15 MED ORDER — TIOTROPIUM BROMIDE MONOHYDRATE 18 MCG IN CAPS: 18.0000 ug | ORAL_CAPSULE | Freq: Every day | RESPIRATORY_TRACT | Status: DC

## 2014-12-15 MED ORDER — LOPERAMIDE HCL 2 MG PO CAPS: 2.0000 mg | ORAL_CAPSULE | ORAL | Status: DC | PRN

## 2014-12-15 MED ORDER — ALBUTEROL SULFATE (2.5 MG/3ML) 0.083% IN NEBU: 2.5000 mg | INHALATION_SOLUTION | Freq: Three times a day (TID) | RESPIRATORY_TRACT | Status: DC

## 2014-12-15 MED ORDER — PREDNISONE 10 MG PO TABS
10.0000 mg | ORAL_TABLET | Freq: Every day | ORAL | Status: DC
  Administered 2014-12-15: 08:00:00 10 mg via ORAL
  Filled 2014-12-15: qty 1

## 2014-12-15 MED ORDER — BENZOCAINE 10 % MT GEL: Freq: Four times a day (QID) | OROMUCOSAL | Status: DC | PRN

## 2014-12-15 MED ORDER — LEVOFLOXACIN 250 MG PO TABS
250.0000 mg | ORAL_TABLET | Freq: Every day | ORAL | Status: DC
Start: 2014-12-15 — End: 2015-03-26

## 2014-12-15 NOTE — Care Management (Signed)
Spoke with Dr. Caryl Comes. States that Veronica Wang is medically stable, but need a facility that can wean her off the high flow nasal cannula. Will be ready for discharge today, if LTAC facility is in place Spoke with daughter, Veronica Wang. States that she has talked to a friend of hers about [facility]. She has decided against [facility]. Discussed [place] she wants to talk to her friend about Administrator, [employer] and she will get back in touch with this case manager this morning. Veronica Ammons RN MSN Care Management [phone]

## 2014-12-15 NOTE — Plan of Care (Signed)
Problem: Discharge Progression Outcomes Goal: Discharge plan in place and appropriate Outcome: Progressing Pt goes by Veronica Wang, she has a hx of  hypertension, GERD (gastroesophageal reflux disease), Liver and stomach cancer, COPD, CHF. She is on home medication. Dependant on home 02.         Goal: Other Discharge Outcomes/Goals Outcome: Progressing Pt alert and oriented, calls out when needing assistance. She does not complain of pain or discomfort, still on HFNC O2 sats 94%.

## 2014-12-15 NOTE — Care Management (Signed)
Received telephone call from daughter, Janina Mayo. States she would like her mother to go to [facility] today. Santiago Glad, representative for [employer] updated. Will contact daughter at [phone] to discuss plans. Dr. Caryl Comes updated. Shelbie Ammons RN MSN Care Management [phone]

## 2014-12-15 NOTE — Discharge Summary (Signed)
Physician Discharge Summary  Patient ID: Veronica Wang MRN: [id number] DOB/AGE: 1935-09-11 79 y.o.  Admit date: 12/04/2014 Discharge date: 12/15/2014  Admission Diagnoses: pneumonia with acute on chronic respiratory failure  Discharge Diagnoses:  Principal Problem:   Pneumonia Active Problems:   Chronic systolic heart failure   Carcinoid tumor of intestine   Acute respiratory failure with hypoxia   Septic shock   COPD (chronic obstructive pulmonary disease)   Malnutrition of moderate degree atrial fibrillation S/p splenectomy  Discharged Condition: stable  Hospital Course: Pt admitted with acute on chronic respiratory failure and evidence of sepsis; imaging revealed bilateral lower lobe pneumonia.  Pt with h/o severe COPD, on chronic oxygen, with intermittent compliance.  Placed on broad spectrum antibiotics with vancomycin, zosyn, and converted to levaquin after all cultures negative.  H1N1, influenza A and B, urine Legionella EIA all negative.  Placed on steroids and inhaled medications for acute COPD exacerbation; finished course of steroids during hospitalization.    Has h/o chronic left side systolic CHF as well as intermittent afib, with echo 12/05/14 showing LVEF 45-50%; not on ACE/ARB, B-blocker, or aldactone due to hypotension and electrolyte abnormalities.  Has not been felt to be candidate for full anticoagulation due to liver involvement with metastatic carcinoid tumor.  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  Followed at [facility] for this, with monthly octreotide injections.  Dietary worked with pt, and diet adjusted.  Pt remained afebrile; WBC remained elevated, but trending down as steroids reduced.  Continued to require HFNC to maintain sats, with pt dropping to 82-84% with ambulation at times, though completely asymptomatic with this.  Due to inability to wean oxygen, recommendation made for pt to go to short term  hospital to continue attempt at weaning as well as to further rehabilitate.  Discharged in stable condition; physical activity up as tolerated.  Diet should be no added salt.  Recommend pt weigh daily, with physician contacted for more than 2 lb gain in 24 hrs or more than 5 lb gain in 1 week, or with increased signs/symptoms of CHF.  She will follow up with MD at facility.    Consults: Pulmonary, ID   Discharge Exam: Blood pressure 97/59, pulse 68, temperature 97.9 F (36.6 C), temperature source Oral, resp. rate 17, height 5\' 3"  (1.6 m), weight 36.651 kg (80 lb 12.8 oz), SpO2 94 %.  Disposition: [facility]      Discharge Instructions    AMB referral to CHF clinic    Complete by:  As directed   Patient previously seen in the heart failure clinic.     Diet - low sodium heart healthy    Complete by:  As directed      Increase activity slowly    Complete by:  As directed      Oxygen therapy    Complete by:  As directed   HFNC to maintain sats >88%            Medication List    STOP taking these medications        carvedilol 12.5 MG tablet  Commonly known as:  COREG     furosemide 20 MG tablet  Commonly known as:  LASIX     lisinopril 2.5 MG tablet  Commonly known as:  PRINIVIL,ZESTRIL      TAKE these medications        acetaminophen 325 MG tablet  Commonly known as:  TYLENOL  Take  2 tablets (650 mg total) by mouth every 6 (six) hours as needed for mild pain (or Fever >/= 101).     albuterol (2.5 MG/3ML) 0.083% nebulizer solution  Commonly known as:  PROVENTIL  Take 3 mLs (2.5 mg total) by nebulization 3 (three) times daily.     aspirin 81 MG tablet  Take 81 mg by mouth daily.     benzocaine 10 % mucosal gel  Commonly known as:  ORAJEL  Use as directed in the mouth or throat 4 (four) times daily as needed for mouth pain.     budesonide 0.5 MG/2ML nebulizer solution  Commonly known as:  PULMICORT  Take 2 mLs (0.5 mg total) by nebulization 2 (two) times  daily.     gabapentin 600 MG tablet  Commonly known as:  NEURONTIN  Take 1,200 mg by mouth 3 (three) times daily.     heparin 5000 UNIT/ML injection  Inject 1 mL (5,000 Units total) into the skin every 8 (eight) hours.     levofloxacin 250 MG tablet  Commonly known as:  LEVAQUIN  Take 1 tablet (250 mg total) by mouth daily. X 7 days     loperamide 2 MG capsule  Commonly known as:  IMODIUM  Take 1 capsule (2 mg total) by mouth as needed for diarrhea or loose stools (every 4 hours prn).     multivitamin capsule  Take 1 capsule by mouth daily.     octreotide 10 MG injection  Commonly known as:  SANDOSTATIN LAR  Inject 10 mg into the muscle every 28 (twenty-eight) days.     tiotropium 18 MCG inhalation capsule  Commonly known as:  SPIRIVA  Place 1 capsule (18 mcg total) into inhaler and inhale daily.     Vitamin D3 1000 UNITS Caps  Take 2 capsules by mouth daily.         Signed: Tama High III 12/15/2014, 1:47 PM

## 2014-12-15 NOTE — Care Management (Signed)
Dr. Caryl Comes will visit at [facility] break and dictate discharge  Summary. Santiago Glad, Primary school teacher will arrange for transfer to [facility] today, Shelbie Ammons RN MSN Care Management [phone]

## 2014-12-16 LAB — PHOSPHORUS: PHOSPHORUS: 3.5 mg/dL (ref 2.5–4.6)

## 2014-12-16 LAB — BLOOD GAS, ARTERIAL
Acid-Base Excess: 5.3 mmol/L — ABNORMAL HIGH (ref 0.0–2.0)
BICARBONATE: 29.6 meq/L — AB (ref 20.0–24.0)
FIO2: 0.8 %
O2 Content: 35 L/min
O2 Saturation: 98.9 %
PO2 ART: 127 mmHg — AB (ref 80.0–100.0)
Patient temperature: 98.6
TCO2: 31 mmol/L (ref 0–100)
pCO2 arterial: 46 mmHg — ABNORMAL HIGH (ref 35.0–45.0)
pH, Arterial: 7.424 (ref 7.350–7.450)

## 2014-12-16 LAB — MAGNESIUM: MAGNESIUM: 2 mg/dL (ref 1.7–2.4)

## 2014-12-16 LAB — CBC WITH DIFFERENTIAL/PLATELET
BASOS PCT: 1 % (ref 0–1)
Basophils Absolute: 0.1 10*3/uL (ref 0.0–0.1)
EOS ABS: 0.1 10*3/uL (ref 0.0–0.7)
Eosinophils Relative: 1 % (ref 0–5)
HCT: 45.4 % (ref 36.0–46.0)
Hemoglobin: 15.3 g/dL — ABNORMAL HIGH (ref 12.0–15.0)
LYMPHS ABS: 2.6 10*3/uL (ref 0.7–4.0)
LYMPHS PCT: 18 % (ref 12–46)
MCH: 28.5 pg (ref 26.0–34.0)
MCHC: 33.7 g/dL (ref 30.0–36.0)
MCV: 84.7 fL (ref 78.0–100.0)
Monocytes Absolute: 1.3 10*3/uL — ABNORMAL HIGH (ref 0.1–1.0)
Monocytes Relative: 9 % (ref 3–12)
NEUTROS ABS: 10.2 10*3/uL — AB (ref 1.7–7.7)
NEUTROS PCT: 71 % (ref 43–77)
PLATELETS: 307 10*3/uL (ref 150–400)
RBC: 5.36 MIL/uL — AB (ref 3.87–5.11)
RDW: 15.1 % (ref 11.5–15.5)
WBC: 14.3 10*3/uL — ABNORMAL HIGH (ref 4.0–10.5)

## 2014-12-16 LAB — COMPREHENSIVE METABOLIC PANEL
ALT: 25 U/L (ref 14–54)
AST: 24 U/L (ref 15–41)
Albumin: 2.6 g/dL — ABNORMAL LOW (ref 3.5–5.0)
Alkaline Phosphatase: 76 U/L (ref 38–126)
Anion gap: 11 (ref 5–15)
BILIRUBIN TOTAL: 0.6 mg/dL (ref 0.3–1.2)
BUN: 21 mg/dL — AB (ref 6–20)
CALCIUM: 9.1 mg/dL (ref 8.9–10.3)
CO2: 30 mmol/L (ref 22–32)
CREATININE: 0.75 mg/dL (ref 0.44–1.00)
Chloride: 96 mmol/L — ABNORMAL LOW (ref 101–111)
Glucose, Bld: 113 mg/dL — ABNORMAL HIGH (ref 65–99)
POTASSIUM: 3.9 mmol/L (ref 3.5–5.1)
Sodium: 137 mmol/L (ref 135–145)
Total Protein: 6.8 g/dL (ref 6.5–8.1)

## 2014-12-16 LAB — FERRITIN: Ferritin: 546 ng/mL — ABNORMAL HIGH (ref 11–307)

## 2014-12-16 LAB — IRON AND TIBC
Iron: 47 ug/dL (ref 28–170)
Saturation Ratios: 16 % (ref 10.4–31.8)
TIBC: 300 ug/dL (ref 250–450)
UIBC: 253 ug/dL

## 2014-12-16 LAB — PROTIME-INR
INR: 1.09 (ref 0.00–1.49)
Prothrombin Time: 14.3 seconds (ref 11.6–15.2)

## 2014-12-16 LAB — TSH: TSH: 0.644 u[IU]/mL (ref 0.350–4.500)

## 2014-12-16 LAB — VITAMIN B12: Vitamin B-12: 2047 pg/mL — ABNORMAL HIGH (ref 180–914)

## 2014-12-16 LAB — PROCALCITONIN: PROCALCITONIN: 0.22 ng/mL

## 2014-12-16 LAB — T4, FREE: Free T4: 1.25 ng/dL — ABNORMAL HIGH (ref 0.61–1.12)

## 2014-12-16 LAB — BRAIN NATRIURETIC PEPTIDE: B NATRIURETIC PEPTIDE 5: 83.6 pg/mL (ref 0.0–100.0)

## 2014-12-17 LAB — HEMOGLOBIN A1C
Hgb A1c MFr Bld: 6.3 % — ABNORMAL HIGH (ref 4.8–5.6)
Mean Plasma Glucose: 134 mg/dL

## 2014-12-17 LAB — BASIC METABOLIC PANEL
Anion gap: 12 (ref 5–15)
BUN: 21 mg/dL — ABNORMAL HIGH (ref 6–20)
CALCIUM: 9.2 mg/dL (ref 8.9–10.3)
CO2: 27 mmol/L (ref 22–32)
Chloride: 97 mmol/L — ABNORMAL LOW (ref 101–111)
Creatinine, Ser: 0.71 mg/dL (ref 0.44–1.00)
GFR calc Af Amer: >60 mL/min (ref >60)
GFR calc non Af Amer: >60 mL/min (ref >60)
Glucose, Bld: 82 mg/dL (ref 65–99)
POTASSIUM: 5.1 mmol/L (ref 3.5–5.1)
Sodium: 136 mmol/L (ref 135–145)

## 2014-12-17 LAB — CBC WITH DIFFERENTIAL/PLATELET
BASOS PCT: 1 % (ref 0–1)
Basophils Absolute: 0.2 10*3/uL — ABNORMAL HIGH (ref 0.0–0.1)
EOS PCT: 0 % (ref 0–5)
Eosinophils Absolute: 0 10*3/uL (ref 0.0–0.7)
HCT: 43.6 % (ref 36.0–46.0)
HEMOGLOBIN: 14.8 g/dL (ref 12.0–15.0)
LYMPHS ABS: 3.4 10*3/uL (ref 0.7–4.0)
Lymphocytes Relative: 21 % (ref 12–46)
MCH: 28.5 pg (ref 26.0–34.0)
MCHC: 33.9 g/dL (ref 30.0–36.0)
MCV: 84 fL (ref 78.0–100.0)
MONO ABS: 1.6 10*3/uL — AB (ref 0.1–1.0)
Monocytes Relative: 10 % (ref 3–12)
NEUTROS PCT: 68 % (ref 43–77)
Neutro Abs: 11 10*3/uL — ABNORMAL HIGH (ref 1.7–7.7)
PLATELETS: 352 10*3/uL (ref 150–400)
RBC: 5.19 MIL/uL — AB (ref 3.87–5.11)
RDW: 15 % (ref 11.5–15.5)
WBC: 16.2 10*3/uL — ABNORMAL HIGH (ref 4.0–10.5)

## 2014-12-18 ENCOUNTER — Inpatient Hospital Stay: Payer: Medicare Other | Attending: Oncology

## 2014-12-18 LAB — PROCALCITONIN

## 2014-12-20 LAB — BRAIN NATRIURETIC PEPTIDE: B Natriuretic Peptide: 141.8 pg/mL — ABNORMAL HIGH (ref 0.0–100.0)

## 2014-12-21 LAB — BASIC METABOLIC PANEL
Anion gap: 6 (ref 5–15)
BUN: 22 mg/dL — ABNORMAL HIGH (ref 6–20)
CO2: 31 mmol/L (ref 22–32)
CREATININE: 0.74 mg/dL (ref 0.44–1.00)
Calcium: 8.8 mg/dL — ABNORMAL LOW (ref 8.9–10.3)
Chloride: 102 mmol/L (ref 101–111)
GFR calc Af Amer: >60 mL/min (ref >60)
GFR calc non Af Amer: >60 mL/min (ref >60)
Glucose, Bld: 91 mg/dL (ref 65–99)
Potassium: 4.1 mmol/L (ref 3.5–5.1)
Sodium: 139 mmol/L (ref 135–145)

## 2014-12-21 LAB — CBC WITH DIFFERENTIAL/PLATELET
Basophils Absolute: 0.1 10*3/uL (ref 0.0–0.1)
Basophils Relative: 0 % (ref 0–1)
Eosinophils Absolute: 0.1 10*3/uL (ref 0.0–0.7)
Eosinophils Relative: 0 % (ref 0–5)
HEMATOCRIT: 35.1 % — AB (ref 36.0–46.0)
HEMOGLOBIN: 11.6 g/dL — AB (ref 12.0–15.0)
LYMPHS PCT: 17 % (ref 12–46)
Lymphs Abs: 3.1 10*3/uL (ref 0.7–4.0)
MCH: 27.4 pg (ref 26.0–34.0)
MCHC: 33 g/dL (ref 30.0–36.0)
MCV: 83 fL (ref 78.0–100.0)
MONO ABS: 1.3 10*3/uL — AB (ref 0.1–1.0)
MONOS PCT: 7 % (ref 3–12)
Neutro Abs: 13.5 10*3/uL — ABNORMAL HIGH (ref 1.7–7.7)
Neutrophils Relative %: 76 % (ref 43–77)
Platelets: 391 10*3/uL (ref 150–400)
RBC: 4.23 MIL/uL (ref 3.87–5.11)
RDW: 15.5 % (ref 11.5–15.5)
WBC: 18 10*3/uL — ABNORMAL HIGH (ref 4.0–10.5)

## 2014-12-22 ENCOUNTER — Ambulatory Visit: Payer: Medicare Other | Admitting: Family

## 2014-12-24 ENCOUNTER — Other Ambulatory Visit (HOSPITAL_COMMUNITY): Payer: Medicare Other

## 2014-12-24 LAB — BASIC METABOLIC PANEL
ANION GAP: 9 (ref 5–15)
BUN: 31 mg/dL — ABNORMAL HIGH (ref 6–20)
CO2: 35 mmol/L — AB (ref 22–32)
Calcium: 9.2 mg/dL (ref 8.9–10.3)
Chloride: 95 mmol/L — ABNORMAL LOW (ref 101–111)
Creatinine, Ser: 0.75 mg/dL (ref 0.44–1.00)
GFR calc Af Amer: >60 mL/min (ref >60)
GFR calc non Af Amer: >60 mL/min (ref >60)
GLUCOSE: 83 mg/dL (ref 65–99)
POTASSIUM: 4.3 mmol/L (ref 3.5–5.1)
SODIUM: 139 mmol/L (ref 135–145)

## 2014-12-24 LAB — CBC WITH DIFFERENTIAL/PLATELET
Basophils Absolute: 0 10*3/uL (ref 0.0–0.1)
Basophils Relative: 0 % (ref 0–1)
Eosinophils Absolute: 0.2 10*3/uL (ref 0.0–0.7)
Eosinophils Relative: 1 % (ref 0–5)
HCT: 35.9 % — ABNORMAL LOW (ref 36.0–46.0)
HEMOGLOBIN: 12 g/dL (ref 12.0–15.0)
Lymphocytes Relative: 13 % (ref 12–46)
Lymphs Abs: 2.5 10*3/uL (ref 0.7–4.0)
MCH: 27.9 pg (ref 26.0–34.0)
MCHC: 33.4 g/dL (ref 30.0–36.0)
MCV: 83.5 fL (ref 78.0–100.0)
MONOS PCT: 5 % (ref 3–12)
Monocytes Absolute: 1 10*3/uL (ref 0.1–1.0)
NEUTROS ABS: 15.3 10*3/uL — AB (ref 1.7–7.7)
NEUTROS PCT: 81 % — AB (ref 43–77)
Platelets: 317 10*3/uL (ref 150–400)
RBC: 4.3 MIL/uL (ref 3.87–5.11)
RDW: 15.9 % — ABNORMAL HIGH (ref 11.5–15.5)
WBC: 19 10*3/uL — AB (ref 4.0–10.5)

## 2014-12-28 ENCOUNTER — Telehealth: Payer: Self-pay | Admitting: Oncology

## 2014-12-28 ENCOUNTER — Other Ambulatory Visit: Payer: Self-pay | Admitting: *Deleted

## 2014-12-28 LAB — CBC WITH DIFFERENTIAL/PLATELET
Basophils Absolute: 0.1 10*3/uL (ref 0.0–0.1)
Basophils Relative: 1 % (ref 0–1)
Eosinophils Absolute: 0.3 10*3/uL (ref 0.0–0.7)
Eosinophils Relative: 2 % (ref 0–5)
HCT: 35.3 % — ABNORMAL LOW (ref 36.0–46.0)
HEMOGLOBIN: 12 g/dL (ref 12.0–15.0)
LYMPHS PCT: 15 % (ref 12–46)
Lymphs Abs: 2.8 10*3/uL (ref 0.7–4.0)
MCH: 28.5 pg (ref 26.0–34.0)
MCHC: 34 g/dL (ref 30.0–36.0)
MCV: 83.8 fL (ref 78.0–100.0)
MONO ABS: 1.2 10*3/uL — AB (ref 0.1–1.0)
MONOS PCT: 6 % (ref 3–12)
NEUTROS ABS: 14.1 10*3/uL — AB (ref 1.7–7.7)
NEUTROS PCT: 76 % (ref 43–77)
Platelets: 246 10*3/uL (ref 150–400)
RBC: 4.21 MIL/uL (ref 3.87–5.11)
RDW: 15.8 % — ABNORMAL HIGH (ref 11.5–15.5)
WBC: 18.5 10*3/uL — AB (ref 4.0–10.5)

## 2014-12-28 LAB — PHOSPHORUS: Phosphorus: 4.1 mg/dL (ref 2.5–4.6)

## 2014-12-28 LAB — BASIC METABOLIC PANEL
Anion gap: 7 (ref 5–15)
BUN: 24 mg/dL — ABNORMAL HIGH (ref 6–20)
CO2: 35 mmol/L — ABNORMAL HIGH (ref 22–32)
Calcium: 9.1 mg/dL (ref 8.9–10.3)
Chloride: 94 mmol/L — ABNORMAL LOW (ref 101–111)
Creatinine, Ser: 0.79 mg/dL (ref 0.44–1.00)
Glucose, Bld: 94 mg/dL (ref 65–99)
Potassium: 4.1 mmol/L (ref 3.5–5.1)
SODIUM: 136 mmol/L (ref 135–145)

## 2014-12-28 LAB — MAGNESIUM: MAGNESIUM: 2.1 mg/dL (ref 1.7–2.4)

## 2014-12-28 MED ORDER — OCTREOTIDE ACETATE 20 MG IM KIT: 20.0000 mg | PACK | Freq: Once | INTRAMUSCULAR | Status: DC

## 2014-12-28 NOTE — Telephone Encounter (Signed)
We will have to clarify with Dr. Grayland Ormond when he returns from vacation next week. Thank you.

## 2014-12-28 NOTE — Telephone Encounter (Signed)
Lelan Pons, caseworker, called to ask a question about the timing of her Sandostatin and when she is being discharged. She also requests that a Rx is faxed to: [phone]. She would like you to call her on her work cell: [phone]. Thanks.

## 2014-12-28 NOTE — Telephone Encounter (Signed)
Veronica Wang was inpatient at [facility] on 12/18/14 when she was supposed to get her next sandostatin shot. She is now in [facility] within [facility] and her daughter is very concerned about her missing the shot. She wants to know if you can please fax over Rx so they will administer it for her. Nurses' Desk at facility: [phone], Caseworker Lelan Pons: [phone]. Judeen Hammans (dtr) left two numbers: work- [phone] and cell- [phone]

## 2014-12-28 NOTE — Telephone Encounter (Signed)
Spoke with Magda Paganini regarding sandostatin injection, okay for patient to wait until next scheduled appt to receive injection. I made a follow up call to daughter Judeen Hammans who reports patient gets very sick without injection and is already beginning to have diarrhea. Prescription printed and faxed to [facility] as ordered by Dr. Ma Hillock. Family and case worker aware of plan.

## 2015-01-01 LAB — CBC
HCT: 35.9 % — ABNORMAL LOW (ref 36.0–46.0)
HEMOGLOBIN: 12.1 g/dL (ref 12.0–15.0)
MCH: 28.5 pg (ref 26.0–34.0)
MCHC: 33.7 g/dL (ref 30.0–36.0)
MCV: 84.5 fL (ref 78.0–100.0)
PLATELETS: 259 10*3/uL (ref 150–400)
RBC: 4.25 MIL/uL (ref 3.87–5.11)
RDW: 16.6 % — ABNORMAL HIGH (ref 11.5–15.5)
WBC: 21 10*3/uL — AB (ref 4.0–10.5)

## 2015-01-01 LAB — BASIC METABOLIC PANEL
Anion gap: 7 (ref 5–15)
BUN: 31 mg/dL — AB (ref 6–20)
CALCIUM: 9 mg/dL (ref 8.9–10.3)
CO2: 34 mmol/L — ABNORMAL HIGH (ref 22–32)
CREATININE: 0.96 mg/dL (ref 0.44–1.00)
Chloride: 95 mmol/L — ABNORMAL LOW (ref 101–111)
GFR calc non Af Amer: 56 mL/min — ABNORMAL LOW (ref >60)
Glucose, Bld: 85 mg/dL (ref 65–99)
Potassium: 4.3 mmol/L (ref 3.5–5.1)
Sodium: 136 mmol/L (ref 135–145)

## 2015-01-02 LAB — CBC WITH DIFFERENTIAL/PLATELET
BASOS ABS: 0.2 10*3/uL — AB (ref 0.0–0.1)
BASOS PCT: 1 % (ref 0–1)
EOS PCT: 1 % (ref 0–5)
Eosinophils Absolute: 0.2 10*3/uL (ref 0.0–0.7)
HEMATOCRIT: 36.6 % (ref 36.0–46.0)
Hemoglobin: 11.9 g/dL — ABNORMAL LOW (ref 12.0–15.0)
LYMPHS PCT: 15 % (ref 12–46)
Lymphs Abs: 3.4 10*3/uL (ref 0.7–4.0)
MCH: 27.7 pg (ref 26.0–34.0)
MCHC: 32.5 g/dL (ref 30.0–36.0)
MCV: 85.3 fL (ref 78.0–100.0)
MONOS PCT: 7 % (ref 3–12)
Monocytes Absolute: 1.6 10*3/uL — ABNORMAL HIGH (ref 0.1–1.0)
Neutro Abs: 17.2 10*3/uL — ABNORMAL HIGH (ref 1.7–7.7)
Neutrophils Relative %: 76 % (ref 43–77)
PLATELETS: 271 10*3/uL (ref 150–400)
RBC: 4.29 MIL/uL (ref 3.87–5.11)
RDW: 16.6 % — AB (ref 11.5–15.5)
WBC: 22.6 10*3/uL — ABNORMAL HIGH (ref 4.0–10.5)

## 2015-01-02 LAB — PROCALCITONIN: Procalcitonin: <0.1 ng/mL

## 2015-01-04 LAB — CBC WITH DIFFERENTIAL/PLATELET
BASOS ABS: 0.2 10*3/uL — AB (ref 0.0–0.1)
Basophils Relative: 1 % (ref 0–1)
EOS PCT: 1 % (ref 0–5)
Eosinophils Absolute: 0.2 10*3/uL (ref 0.0–0.7)
HEMATOCRIT: 34.7 % — AB (ref 36.0–46.0)
HEMOGLOBIN: 11.2 g/dL — AB (ref 12.0–15.0)
Lymphocytes Relative: 14 % (ref 12–46)
Lymphs Abs: 3.2 10*3/uL (ref 0.7–4.0)
MCH: 27.6 pg (ref 26.0–34.0)
MCHC: 32.3 g/dL (ref 30.0–36.0)
MCV: 85.5 fL (ref 78.0–100.0)
MONO ABS: 1.8 10*3/uL — AB (ref 0.1–1.0)
MONOS PCT: 8 % (ref 3–12)
NEUTROS PCT: 76 % (ref 43–77)
Neutro Abs: 17.4 10*3/uL — ABNORMAL HIGH (ref 1.7–7.7)
Platelets: 262 10*3/uL (ref 150–400)
RBC: 4.06 MIL/uL (ref 3.87–5.11)
RDW: 16.5 % — ABNORMAL HIGH (ref 11.5–15.5)
WBC: 22.8 10*3/uL — ABNORMAL HIGH (ref 4.0–10.5)

## 2015-01-04 LAB — BASIC METABOLIC PANEL
ANION GAP: 6 (ref 5–15)
BUN: 22 mg/dL — ABNORMAL HIGH (ref 6–20)
CO2: 32 mmol/L (ref 22–32)
Calcium: 8.8 mg/dL — ABNORMAL LOW (ref 8.9–10.3)
Chloride: 98 mmol/L — ABNORMAL LOW (ref 101–111)
Creatinine, Ser: 0.77 mg/dL (ref 0.44–1.00)
GFR calc Af Amer: >60 mL/min (ref >60)
GFR calc non Af Amer: >60 mL/min (ref >60)
Glucose, Bld: 131 mg/dL — ABNORMAL HIGH (ref 65–99)
Potassium: 3.5 mmol/L (ref 3.5–5.1)
SODIUM: 136 mmol/L (ref 135–145)

## 2015-01-15 ENCOUNTER — Inpatient Hospital Stay: Payer: Medicare Other

## 2015-01-28 ENCOUNTER — Inpatient Hospital Stay: Payer: Medicare Other | Attending: Oncology

## 2015-01-28 VITALS — BP 109/62 | HR 81 | Temp 98.0°F | Resp 18

## 2015-01-28 DIAGNOSIS — D3A098 Benign carcinoid tumors of other sites: Secondary | ICD-10-CM

## 2015-01-28 DIAGNOSIS — C7A019 Malignant carcinoid tumor of the small intestine, unspecified portion: Secondary | ICD-10-CM | POA: Insufficient documentation

## 2015-01-28 DIAGNOSIS — Z79899 Other long term (current) drug therapy: Secondary | ICD-10-CM | POA: Insufficient documentation

## 2015-01-28 MED ORDER — OCTREOTIDE ACETATE 20 MG IM KIT
20.0000 mg | PACK | Freq: Once | INTRAMUSCULAR | Status: AC
  Administered 2015-01-28: 20 mg via INTRAMUSCULAR
  Filled 2015-01-28: qty 1

## 2015-02-12 ENCOUNTER — Inpatient Hospital Stay: Payer: Medicare Other

## 2015-02-17 ENCOUNTER — Ambulatory Visit: Payer: Medicare Other | Admitting: Family

## 2015-02-25 ENCOUNTER — Inpatient Hospital Stay: Payer: Medicare Other | Attending: Oncology

## 2015-02-25 DIAGNOSIS — C7A019 Malignant carcinoid tumor of the small intestine, unspecified portion: Secondary | ICD-10-CM | POA: Insufficient documentation

## 2015-02-25 DIAGNOSIS — Z79899 Other long term (current) drug therapy: Secondary | ICD-10-CM | POA: Insufficient documentation

## 2015-02-25 DIAGNOSIS — D3A098 Benign carcinoid tumors of other sites: Secondary | ICD-10-CM

## 2015-02-25 MED ORDER — OCTREOTIDE ACETATE 20 MG IM KIT
20.0000 mg | PACK | Freq: Once | INTRAMUSCULAR | Status: AC
  Administered 2015-02-25: 20 mg via INTRAMUSCULAR
  Filled 2015-02-25: qty 1

## 2015-03-12 ENCOUNTER — Inpatient Hospital Stay: Payer: Medicare Other

## 2015-03-25 ENCOUNTER — Other Ambulatory Visit: Payer: Self-pay | Admitting: *Deleted

## 2015-03-25 DIAGNOSIS — D3A098 Benign carcinoid tumors of other sites: Secondary | ICD-10-CM

## 2015-03-26 ENCOUNTER — Inpatient Hospital Stay (HOSPITAL_BASED_OUTPATIENT_CLINIC_OR_DEPARTMENT_OTHER): Payer: Medicare Other | Admitting: Oncology

## 2015-03-26 ENCOUNTER — Inpatient Hospital Stay: Payer: Medicare Other | Attending: Oncology

## 2015-03-26 ENCOUNTER — Ambulatory Visit: Payer: Medicare Other

## 2015-03-26 ENCOUNTER — Encounter: Payer: Self-pay | Admitting: Oncology

## 2015-03-26 VITALS — BP 122/64 | HR 72 | Temp 96.1°F | Resp 16 | Wt 83.8 lb

## 2015-03-26 DIAGNOSIS — Z79899 Other long term (current) drug therapy: Secondary | ICD-10-CM | POA: Diagnosis not present

## 2015-03-26 DIAGNOSIS — K219 Gastro-esophageal reflux disease without esophagitis: Secondary | ICD-10-CM | POA: Insufficient documentation

## 2015-03-26 DIAGNOSIS — C7B02 Secondary carcinoid tumors of liver: Secondary | ICD-10-CM

## 2015-03-26 DIAGNOSIS — Z87891 Personal history of nicotine dependence: Secondary | ICD-10-CM | POA: Diagnosis not present

## 2015-03-26 DIAGNOSIS — D3A098 Benign carcinoid tumors of other sites: Secondary | ICD-10-CM

## 2015-03-26 DIAGNOSIS — J9601 Acute respiratory failure with hypoxia: Secondary | ICD-10-CM

## 2015-03-26 DIAGNOSIS — I10 Essential (primary) hypertension: Secondary | ICD-10-CM | POA: Diagnosis not present

## 2015-03-26 DIAGNOSIS — Z7982 Long term (current) use of aspirin: Secondary | ICD-10-CM | POA: Diagnosis not present

## 2015-03-26 MED ORDER — OCTREOTIDE ACETATE 20 MG IM KIT
20.0000 mg | PACK | Freq: Once | INTRAMUSCULAR | Status: AC
  Administered 2015-03-26: 20 mg via INTRAMUSCULAR

## 2015-03-30 LAB — CHROMOGRANIN A: CHROMOGRANIN A: 11 nmol/L — AB (ref 0–5)

## 2015-04-04 NOTE — Progress Notes (Signed)
Veronica Wang  [phone] Fax:([phone]) [phone]  ID: Veronica Wang OB: [birth year]  MR#: [id number]  QQV#:[id number]  Patient Care Team: Adin Hector, MD as PCP - General (Internal Medicine)  CHIEF COMPLAINT:  Chief Complaint  Patient presents with  . Follow-up    carcinoid    INTERVAL HISTORY: Patient for further evaluation and continuation of monthly octreotide injections. She continues to feel well and remains asymptomatic. She has no neurologic complaints.  She denies any recent fever or illnesses.  She has a good appetite and her weight has remained stable.  He denies any chest pain or shortness of breath.  She has no abdominal pain and denies any nausea, vomiting, diarrhea or constipation.  She has no urinary complaints.  Patient offers no specific complaints today.   REVIEW OF SYSTEMS:   Review of Systems  Constitutional: Negative.   Respiratory: Negative.   Cardiovascular: Negative.   Gastrointestinal: Negative for abdominal pain and diarrhea.  Musculoskeletal: Negative.   Neurological: Negative.     As per HPI. Otherwise, a complete review of systems is negatve.  PAST MEDICAL HISTORY: Past Medical History  Diagnosis Date  . Hypertension   . GERD (gastroesophageal reflux disease)   . Cancer     Liver  . Cancer     stomach  . FH: chemotherapy   . Radiation   . COPD (chronic obstructive pulmonary disease)   . CHF (congestive heart failure)   . Hyperlipidemia   . Carcinoid tumor of intestine   . Macular degeneration   . Polycythemia   . Atrial fibrillation     PAST SURGICAL HISTORY: Past Surgical History  Procedure Laterality Date  . Colon resection    . Cataract extraction  splenectomy  . Colon surgery    . Gastrectomy    . Splenectomy      FAMILY HISTORY Family History  Problem Relation Age of Onset  . Heart attack Sister   . Heart attack Brother   . Leukemia Mother        ADVANCED DIRECTIVES:    HEALTH  MAINTENANCE: Social History  Substance Use Topics  . Smoking status: Former Smoker    Types: Cigarettes  . Smokeless tobacco: Not on file  . Alcohol Use: No     Colonoscopy:  PAP:  Bone density:  Lipid panel:  Allergies  Allergen Reactions  . No Known Allergies     Current Outpatient Prescriptions  Medication Sig Dispense Refill  . acetaminophen (TYLENOL) 325 MG tablet Take 2 tablets (650 mg total) by mouth every 6 (six) hours as needed for mild pain (or Fever >/= 101). 30 tablet 1  . albuterol (PROVENTIL) (2.5 MG/3ML) 0.083% nebulizer solution Take 3 mLs (2.5 mg total) by nebulization 3 (three) times daily. 75 mL 12  . aspirin EC 81 MG tablet Take by mouth.    Marland Kitchen azelastine (ASTELIN) 0.1 % nasal spray Place into the nose.    . benzocaine (ORAJEL) 10 % mucosal gel Use as directed in the mouth or throat 4 (four) times daily as needed for mouth pain. 5.3 g 0  . budesonide (PULMICORT) 0.5 MG/2ML nebulizer solution Take 2 mLs (0.5 mg total) by nebulization 2 (two) times daily. 40 mL 1  . busPIRone (BUSPAR) 15 MG tablet Take by mouth.    . carvedilol (COREG) 3.125 MG tablet 6.25 mg 2 (two) times daily with meals.     . Cholecalciferol (VITAMIN D-1000 MAX ST) 1000 UNITS tablet Take by mouth.    Marland Kitchen  Cyanocobalamin (VITAMIN B-12 CR) 1000 MCG TBCR Take by mouth.    . famotidine (PEPCID) 20 MG tablet Take by mouth.    . furosemide (LASIX) 20 MG tablet Take by mouth.    . gabapentin (NEURONTIN) 300 MG capsule Take by mouth.    . Multiple Vitamins-Minerals (CENTRUM SILVER) tablet Take by mouth.    . nystatin (MYCOSTATIN) 100000 UNIT/ML suspension Take by mouth.    Marland Kitchen octreotide (SANDOSTATIN LAR DEPOT) 20 MG injection Inject 20 mg into the muscle once. 1 each 0  . ondansetron (ZOFRAN-ODT) 4 MG disintegrating tablet Take by mouth.    . potassium chloride (K-DUR) 10 MEQ tablet     . tiotropium (SPIRIVA) 18 MCG inhalation capsule Place 1 capsule (18 mcg total) into inhaler and inhale daily. 30  capsule 12   No current facility-administered medications for this visit.    OBJECTIVE: Filed Vitals:   03/26/15 1219  BP: 122/64  Pulse: 72  Temp: 96.1 F (35.6 C)  Resp: 16     Body mass index is 14.84 kg/(m^2).    ECOG FS:0 - Asymptomatic  General: Well-developed, well-nourished, no acute distress. Eyes: Pink conjunctiva, anicteric sclera. Lungs: Clear to auscultation bilaterally. Heart: Regular rate and rhythm. No rubs, murmurs, or gallops. Abdomen: Soft, nontender, nondistended. No organomegaly noted, normoactive bowel sounds. Musculoskeletal: No edema, cyanosis, or clubbing. Neuro: Alert, answering all questions appropriately. Cranial nerves grossly intact. Skin: No rashes or petechiae noted. Psych: Normal affect.   LAB RESULTS:  Lab Results  Component Value Date   NA 136 01/04/2015   K 3.5 01/04/2015   CL 98* 01/04/2015   CO2 32 01/04/2015   GLUCOSE 131* 01/04/2015   BUN 22* 01/04/2015   CREATININE 0.77 01/04/2015   CALCIUM 8.8* 01/04/2015   PROT 6.8 12/16/2014   ALBUMIN 2.6* 12/16/2014   AST 24 12/16/2014   ALT 25 12/16/2014   ALKPHOS 76 12/16/2014   BILITOT 0.6 12/16/2014   GFRNONAA >60 01/04/2015   GFRAA >60 01/04/2015    Lab Results  Component Value Date   WBC 22.8* 01/04/2015   NEUTROABS 17.4* 01/04/2015   HGB 11.2* 01/04/2015   HCT 34.7* 01/04/2015   MCV 85.5 01/04/2015   PLT 262 01/04/2015     STUDIES: No results found.  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  PLAN:    1.  Metastatic carcinoid: Patient's MRI from February 2016 was reported as stable with no change in her disease burden.  Continue with 20 mg intramuscular or octreotide every 4 weeks.  Her chromogranin levels are mildly elevated at 11, but stable, 24-hour HIAA urine is within normal limits, today's results are pending.   Return every 4 weeks for octreotide injections and then in 6 months for further evaluation and laboratory work.  Will repeat MRI in one year in  February 2017.   Patient expressed understanding and was in agreement with this plan. She also understands that She can call clinic at any time with any questions, concerns, or complaints.    Lloyd Huger, MD   04/04/2015 9:01 AM

## 2015-04-07 ENCOUNTER — Ambulatory Visit: Payer: Medicare Other

## 2015-04-07 DIAGNOSIS — C7B02 Secondary carcinoid tumors of liver: Secondary | ICD-10-CM | POA: Diagnosis not present

## 2015-04-09 ENCOUNTER — Inpatient Hospital Stay: Payer: Medicare Other

## 2015-04-09 ENCOUNTER — Other Ambulatory Visit: Payer: Self-pay

## 2015-04-09 ENCOUNTER — Ambulatory Visit: Payer: Self-pay | Admitting: Oncology

## 2015-04-13 LAB — 5 HIAA, QUANTITATIVE, URINE, 24 HOUR
5 HIAA UR: 8.3 mg/L
5-HIAA,QUANT.,24 HR URINE: 11.2 mg/(24.h) (ref 0.0–14.9)
Total Volume: 1350

## 2015-04-23 ENCOUNTER — Inpatient Hospital Stay: Payer: Medicare Other | Attending: Oncology

## 2015-04-23 VITALS — BP 135/71 | HR 64 | Temp 97.5°F

## 2015-04-23 DIAGNOSIS — Z79899 Other long term (current) drug therapy: Secondary | ICD-10-CM | POA: Diagnosis not present

## 2015-04-23 DIAGNOSIS — C7B02 Secondary carcinoid tumors of liver: Secondary | ICD-10-CM | POA: Insufficient documentation

## 2015-04-23 DIAGNOSIS — D3A098 Benign carcinoid tumors of other sites: Secondary | ICD-10-CM

## 2015-04-23 MED ORDER — OCTREOTIDE ACETATE 20 MG IM KIT
20.0000 mg | PACK | Freq: Once | INTRAMUSCULAR | Status: AC
Start: 2015-04-23 — End: 2015-04-23
  Administered 2015-04-23: 20 mg via INTRAMUSCULAR

## 2015-05-21 ENCOUNTER — Inpatient Hospital Stay: Payer: Medicare Other | Attending: Oncology

## 2015-05-21 VITALS — BP 101/56 | HR 62 | Temp 97.2°F

## 2015-05-21 DIAGNOSIS — Z79899 Other long term (current) drug therapy: Secondary | ICD-10-CM | POA: Insufficient documentation

## 2015-05-21 DIAGNOSIS — C7B02 Secondary carcinoid tumors of liver: Secondary | ICD-10-CM | POA: Insufficient documentation

## 2015-05-21 DIAGNOSIS — D3A098 Benign carcinoid tumors of other sites: Secondary | ICD-10-CM

## 2015-05-21 MED ORDER — OCTREOTIDE ACETATE 20 MG IM KIT
20.0000 mg | PACK | Freq: Once | INTRAMUSCULAR | Status: AC
  Administered 2015-05-21: 20 mg via INTRAMUSCULAR

## 2015-06-02 ENCOUNTER — Encounter: Payer: Self-pay | Admitting: Emergency Medicine

## 2015-06-02 ENCOUNTER — Emergency Department
Admission: EM | Admit: 2015-06-02 | Discharge: 2015-06-02 | Disposition: A | Discharge status: Home / Self Care | Payer: Medicare Other | Attending: Emergency Medicine | Admitting: Emergency Medicine

## 2015-06-02 ENCOUNTER — Emergency Department: Payer: Medicare Other

## 2015-06-02 ENCOUNTER — Ambulatory Visit (INDEPENDENT_AMBULATORY_CARE_PROVIDER_SITE_OTHER)
Admission: EM | Admit: 2015-06-02 | Discharge: 2015-06-02 | Disposition: A | Discharge status: Other Acute Inpatient Hospital | Payer: Medicare Other | Source: Home / Self Care | Attending: Family Medicine | Admitting: Family Medicine

## 2015-06-02 ENCOUNTER — Other Ambulatory Visit: Payer: Self-pay

## 2015-06-02 DIAGNOSIS — Z7982 Long term (current) use of aspirin: Secondary | ICD-10-CM | POA: Insufficient documentation

## 2015-06-02 DIAGNOSIS — R001 Bradycardia, unspecified: Secondary | ICD-10-CM | POA: Diagnosis not present

## 2015-06-02 DIAGNOSIS — I10 Essential (primary) hypertension: Secondary | ICD-10-CM | POA: Diagnosis not present

## 2015-06-02 DIAGNOSIS — Z79899 Other long term (current) drug therapy: Secondary | ICD-10-CM | POA: Insufficient documentation

## 2015-06-02 DIAGNOSIS — Z8679 Personal history of other diseases of the circulatory system: Secondary | ICD-10-CM

## 2015-06-02 DIAGNOSIS — R42 Dizziness and giddiness: Secondary | ICD-10-CM | POA: Diagnosis not present

## 2015-06-02 DIAGNOSIS — Z7951 Long term (current) use of inhaled steroids: Secondary | ICD-10-CM | POA: Diagnosis not present

## 2015-06-02 DIAGNOSIS — Z87891 Personal history of nicotine dependence: Secondary | ICD-10-CM | POA: Insufficient documentation

## 2015-06-02 LAB — URINALYSIS COMPLETE WITH MICROSCOPIC (ARMC ONLY)
BILIRUBIN URINE: NEGATIVE
Bacteria, UA: NONE SEEN
GLUCOSE, UA: NEGATIVE mg/dL
Hgb urine dipstick: NEGATIVE
KETONES UR: NEGATIVE mg/dL
Leukocytes, UA: NEGATIVE
NITRITE: NEGATIVE
Protein, ur: NEGATIVE mg/dL
RBC / HPF: NONE SEEN RBC/hpf (ref 0–5)
SPECIFIC GRAVITY, URINE: 1.005 (ref 1.005–1.030)
pH: 5 (ref 5.0–8.0)

## 2015-06-02 LAB — BRAIN NATRIURETIC PEPTIDE: B NATRIURETIC PEPTIDE 5: 108 pg/mL — AB (ref 0.0–100.0)

## 2015-06-02 LAB — HEPATIC FUNCTION PANEL
ALBUMIN: 4.1 g/dL (ref 3.5–5.0)
ALK PHOS: 90 U/L (ref 38–126)
ALT: 14 U/L (ref 14–54)
AST: 29 U/L (ref 15–41)
Bilirubin, Direct: <0.1 mg/dL — ABNORMAL LOW (ref 0.1–0.5)
TOTAL PROTEIN: 7.4 g/dL (ref 6.5–8.1)
Total Bilirubin: 0.5 mg/dL (ref 0.3–1.2)

## 2015-06-02 LAB — BASIC METABOLIC PANEL
ANION GAP: 8 (ref 5–15)
BUN: 14 mg/dL (ref 6–20)
CALCIUM: 9.5 mg/dL (ref 8.9–10.3)
CO2: 28 mmol/L (ref 22–32)
Chloride: 104 mmol/L (ref 101–111)
Creatinine, Ser: 0.85 mg/dL (ref 0.44–1.00)
GLUCOSE: 82 mg/dL (ref 65–99)
Potassium: 4.1 mmol/L (ref 3.5–5.1)
Sodium: 140 mmol/L (ref 135–145)

## 2015-06-02 LAB — TROPONIN I

## 2015-06-02 LAB — CBC
HCT: 44 % (ref 35.0–47.0)
HEMOGLOBIN: 14.3 g/dL (ref 12.0–16.0)
MCH: 26.4 pg (ref 26.0–34.0)
MCHC: 32.5 g/dL (ref 32.0–36.0)
MCV: 81 fL (ref 80.0–100.0)
Platelets: 228 10*3/uL (ref 150–440)
RBC: 5.43 MIL/uL — ABNORMAL HIGH (ref 3.80–5.20)
RDW: 15.8 % — ABNORMAL HIGH (ref 11.5–14.5)
WBC: 8.3 10*3/uL (ref 3.6–11.0)

## 2015-06-02 MED ORDER — SODIUM CHLORIDE 0.9 % IV BOLUS (SEPSIS)
500.0000 mL | INTRAVENOUS | Status: AC
  Administered 2015-06-02: 500 mL via INTRAVENOUS

## 2015-06-02 MED ORDER — GABAPENTIN 300 MG PO CAPS
300.0000 mg | ORAL_CAPSULE | ORAL | Status: AC
Start: 2015-06-02 — End: 2015-06-02
  Administered 2015-06-02: 300 mg via ORAL
  Filled 2015-06-02: qty 1

## 2015-06-02 NOTE — ED Notes (Signed)
Pt arrived from urgent care by EMS for dizziness and bradycardia. EMS reports heart rate at 56-58 range, CBG 93, BP 110/54 and EKG unremarkable. Pt states she woke up feeling dizzy and went to urgent care. Asymptomatic to the bradycardia other than dizziness. Pt A/O x4.

## 2015-06-02 NOTE — ED Notes (Signed)
Patient reports dizziness and low heart rate since this morning.  Patient denies chest pain or SOB.  Patient c/o left flank pain for a week.  Patient denies N/V.

## 2015-06-02 NOTE — Discharge Instructions (Signed)
As we discussed, your workup today was reassuring.  Though we do not know exactly what is causing your symptoms, it appears that you have no emergent medical condition at this time are safe to go home and follow up as recommended in this paperwork.  We gave you a small mount of IV fluids because her blood pressure did drop initially when you stood up, but we do encourage you to continue taking your medications as prescribed.  Please try to eat and drink appropriately and follow-up with your cardiologist and/or primary care doctor at the next available opportunity.  Initially it was thought that perhaps her heart rate was too low and that was what was causing your dizziness, but your heart rate is been strong and consistent throughout your stay in the emergency department.  Please return immediately to the Emergency Department if you develop any new or worsening symptoms that concern you.   Near-Syncope Near-syncope (commonly known as near fainting) is sudden weakness, dizziness, or feeling like you might pass out. During an episode of near-syncope, you may also develop pale skin, have tunnel vision, or feel sick to your stomach (nauseous). Near-syncope may occur when getting up after sitting or while standing for a long time. It is caused by a sudden decrease in blood flow to the brain. This decrease can result from various causes or triggers, most of which are not serious. However, because near-syncope can sometimes be a sign of something serious, a medical evaluation is required. The specific cause is often not determined. HOME CARE INSTRUCTIONS  Monitor your condition for any changes. The following actions may help to alleviate any discomfort you are experiencing:  Have someone stay with you until you feel stable.  Lie down right away and prop your feet up if you start feeling like you might faint. Breathe deeply and steadily. Wait until all the symptoms have passed. Most of these episodes last only  a few minutes. You may feel tired for several hours.   Drink enough fluids to keep your urine clear or pale yellow.   If you are taking blood pressure or heart medicine, get up slowly when seated or lying down. Take several minutes to sit and then stand. This can reduce dizziness.  Follow up with your health care provider as directed. SEEK IMMEDIATE MEDICAL CARE IF:   You have a severe headache.   You have unusual pain in the chest, abdomen, or back.   You are bleeding from the mouth or rectum, or you have black or tarry stool.   You have an irregular or very fast heartbeat.   You have repeated fainting or have seizure-like jerking during an episode.   You faint when sitting or lying down.   You have confusion.   You have difficulty walking.   You have severe weakness.   You have vision problems.  MAKE SURE YOU:   Understand these instructions.  Will watch your condition.  Will get help right away if you are not doing well or get worse.   This information is not intended to replace advice given to you by your health care provider. Make sure you discuss any questions you have with your health care provider.   Document Released: 06/26/2005 Document Revised: 07/01/2013 Document Reviewed: 11/29/2012 Elsevier Interactive Patient Education 2016 Else[employer] Inc[employer]  Bradycardia Bradycardia is a slower-than-normal heart rate. A normal resting heart rate for an adult ranges from 60 to 100 beats per minute. With bradycardia, the resting heart rate is  less than 60 beats per minute. Bradycardia is a problem if your heart cannot pump enough oxygen-rich blood through your body. Bradycardia is not a problem for everyone. For some healthy adults, a slow resting heart rate is normal.  CAUSES  Bradycardia may be caused by:  A problem with the heart's electrical system, such as heart block.  A problem with the heart's natural pacemaker (sinus node).  Heart disease, damage, or  infection.  Certain medicines that treat heart conditions.  Certain conditions, such as hypothyroidism and obstructive sleep apnea. RISK FACTORS  Risk factors include:  Being 49 or older.  Having high blood pressure (hypertension), high cholesterol (hyperlipidemia), or diabetes.  Drinking heavily, using tobacco products, or using drugs.  Being stressed. SIGNS AND SYMPTOMS  Signs and symptoms include:  Light-headedness.  Faintingor near fainting.  Fatigue and weakness.  Shortness of breath.  Chest pain (angina).  Drowsiness.  Confusion.  Dizziness. DIAGNOSIS  Diagnosis of bradycardia may include:  A physical exam.  An electrocardiogram (ECG).  Blood tests. TREATMENT  Treatment for bradycardia may include:  Treatment of an underlying condition.  Pacemaker placement. A pacemaker is a small, battery-powered device that is placed under the skin and is programmed to sense your heartbeats. If your heart rate is lower than the programmed rate, the pacemaker will pace your heart.  Changing your medicines or dosages. HOME CARE INSTRUCTIONS  Take medicines only as directed by your health care provider.  Manage any health conditions that contribute to bradycardia as directed by your health care provider.  Follow a heart-healthy diet. A dietitian can help educate you on healthy food options and changes.  Follow an exercise program approved by your health care provider.  Maintain a healthy weight. Lose weight as approved by your health care provider.  Do not use tobacco products, including cigarettes, chewing tobacco, or electronic cigarettes. If you need help quitting, ask your health care provider.  Do not use illegal drugs.  Limit alcohol intake to no more than 1 drink per day for nonpregnant women and 2 drinks per day for men. One drink equals 12 ounces of beer, 5 ounces of wine, or 1 ounces of hard liquor.  Keep all follow-up visits as directed by your  health care provider. This is important. SEEK MEDICAL CARE IF:  You feel light-headed or dizzy.  You almost faint.  You feel weak or are easily fatigued during physical activity.  You experience confusion or have memory problems. SEEK IMMEDIATE MEDICAL CARE IF:   You faint.  You have an irregular heartbeat.  You have chest pain.  You have trouble breathing. MAKE SURE YOU:   Understand these instructions.  Will watch your condition.  Will get help right away if you are not doing well or get worse.   This information is not intended to replace advice given to you by your health care provider. Make sure you discuss any questions you have with your health care provider.   Document Released: 03/18/2002 Document Revised: 07/17/2014 Document Reviewed: 10/01/2013 Elsevier Interactive Patient Education [employer].

## 2015-06-02 NOTE — ED Notes (Signed)
Patient transported to X-ray 

## 2015-06-02 NOTE — ED Provider Notes (Signed)
[facility] Emergency Department Provider Note  ____________________________________________  Time seen: Approximately 3:56 PM  I have reviewed the triage vital signs and the nursing notes.   HISTORY  Chief Complaint Dizziness    HPI Veronica Wang is a 79 y.o. female with multiple chronic medical problems including mild CHF and severe COPD on chronic oxygen who presents with dizziness since she awoke this morning.  Of note, her heart rate is also been very low, frequently in the 40s and most consistently in the 50s.  This is unusual because her heart rate is typically in the 80s.  She has had episodes of dizziness in the past but not consistently and never this severe.  She denies chest pain, shortness of breath, abdominal pain, nausea/vomiting, any focal weakness or numbness, dysuria, fever/chills.  She does not have any facial droop, difficulty swallowing, or difficulty speaking.  She has never had a heart attack.  She reports decreased by mouth intake recently, but her daughter confirms that the patient almost never eats much of anything.   Past Medical History  Diagnosis Date  . Hypertension   . GERD (gastroesophageal reflux disease)   . Cancer ([place])     Liver  . Cancer ([place])     stomach  . FH: chemotherapy   . Radiation   . COPD (chronic obstructive pulmonary disease) ([place])   . CHF (congestive heart failure) ([place])   . Hyperlipidemia   . 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 Macular degeneration   . Polycythemia   . Atrial fibrillation [facility])     Patient Active Problem List   Diagnosis Date Noted  . Malnutrition of moderate degree ([place]) 12/06/2014  . Acute respiratory failure with hypoxia ([place]) 12/05/2014  . Pneumonia 12/05/2014  . Septic shock ([place]) 12/05/2014  . COPD (chronic obstructive pulmonary disease) ([place]) 12/05/2014  . 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 Chronic systolic heart failure ([place]) 11/10/2014    Past  Surgical History  Procedure Laterality Date  . Colon resection    . Cataract extraction  splenectomy  . Colon surgery    . Gastrectomy    . Splenectomy      Current Outpatient Rx  Name  Route  Sig  Dispense  Refill  . acetaminophen (TYLENOL) 325 MG tablet   Oral   Take 2 tablets (650 mg total) by mouth every 6 (six) hours as needed for mild pain (or Fever >/= 101).   30 tablet   1   . albuterol (PROVENTIL HFA;VENTOLIN HFA) 108 (90 BASE) MCG/ACT inhaler   Inhalation   Inhale 2 puffs into the lungs every 6 (six) hours as needed for wheezing or shortness of breath.         Marland Kitchen albuterol (PROVENTIL) (2.5 MG/3ML) 0.083% nebulizer solution   Nebulization   Take 3 mLs (2.5 mg total) by nebulization 3 (three) times daily.   75 mL   12   . aspirin EC 81 MG tablet   Oral   Take by mouth.         Marland Kitchen azelastine (ASTELIN) 0.1 % nasal spray   Nasal   Place into the nose.         . benzocaine (ORAJEL) 10 % mucosal gel   Mouth/Throat   Use as directed in the mouth or throat 4 (four) times daily as needed for mouth pain.   5.3 g   0   . budesonide (PULMICORT) 0.5 MG/2ML nebulizer solution  Nebulization   Take 2 mLs (0.5 mg total) by nebulization 2 (two) times daily.   40 mL   1   . busPIRone (BUSPAR) 15 MG tablet   Oral   Take by mouth.         . carvedilol (COREG) 3.125 MG tablet      6.25 mg 2 (two) times daily with meals.          . Cholecalciferol (VITAMIN D-1000 MAX ST) 1000 UNITS tablet   Oral   Take by mouth.         . Cyanocobalamin (VITAMIN B-12 CR) 1000 MCG TBCR   Oral   Take by mouth.         . famotidine (PEPCID) 20 MG tablet   Oral   Take by mouth.         . furosemide (LASIX) 20 MG tablet   Oral   Take by mouth.         . gabapentin (NEURONTIN) 300 MG capsule   Oral   Take by mouth.         . Multiple Vitamins-Minerals (CENTRUM SILVER) tablet   Oral   Take by mouth.         . nystatin (MYCOSTATIN) 100000 UNIT/ML  suspension   Oral   Take by mouth.         Marland Kitchen octreotide (SANDOSTATIN LAR DEPOT) 20 MG injection   Intramuscular   Inject 20 mg into the muscle once.   1 each   0   . ondansetron (ZOFRAN-ODT) 4 MG disintegrating tablet   Oral   Take by mouth.         . potassium chloride (K-DUR) 10 MEQ tablet               . tiotropium (SPIRIVA) 18 MCG inhalation capsule   Inhalation   Place 1 capsule (18 mcg total) into inhaler and inhale daily.   30 capsule   12     Allergies No known allergies  Family History  Problem Relation Age of Onset  . Heart attack Sister   . Heart attack Brother   . Leukemia Mother     Social History Social History  Substance Use Topics  . Smoking status: Former Smoker    Types: Cigarettes  . Smokeless tobacco: None  . Alcohol Use: No    Review of Systems Constitutional: No fever/chills Eyes: No visual changes. ENT: No sore throat. Cardiovascular: Denies chest pain. Respiratory: Denies shortness of breath greater than her baseline Gastrointestinal: No abdominal pain.  No nausea, no vomiting.  No diarrhea.  No constipation. Genitourinary: Negative for dysuria. Musculoskeletal: Negative for back pain. Skin: Negative for rash. Neurological: Negative for headaches, focal weakness or numbness.  Generalized dizziness.  10-point ROS otherwise negative.  ____________________________________________   PHYSICAL EXAM:  VITAL SIGNS: ED Triage Vitals  Enc Vitals Group     BP 06/02/15 1547 151/65 mmHg     Pulse Rate 06/02/15 1547 54     Resp 06/02/15 1547 14     Temp 06/02/15 1547 97.4 F (36.3 C)     Temp Source 06/02/15 1547 Oral     SpO2 06/02/15 1547 95 %     Weight 06/02/15 1547 85 lb (38.556 kg)     Height 06/02/15 1547 5\' 4"  (1.626 m)     Head Cir --      Peak Flow --      Pain Score --      Pain Loc --  Pain Edu? --      Excl. in [place]? --     Constitutional: Alert and oriented. Well appearing and in no acute distress.   Has the appearance of chronic illness including cachexia and chronic oxygen. Eyes: Conjunctivae are normal. PERRL. EOMI. Head: Atraumatic. Nose: No congestion/rhinnorhea. Mouth/Throat: Mucous membranes are moist.  Oropharynx non-erythematous. Neck: No stridor.   Cardiovascular: Bradycardia ranging from the upper 40s to the mid 50s consistently, regular rhythm. Grossly normal heart sounds.  Good peripheral circulation. Respiratory: Normal respiratory effort.  No retractions. Lungs CTAB. Gastrointestinal: Soft and nontender. No distention. No abdominal bruits. No CVA tenderness. Musculoskeletal: No lower extremity tenderness nor edema.  No joint effusions. Neurologic:  Normal speech and language. No gross focal neurologic deficits are appreciated.  Skin:  Skin is warm, dry and intact. No rash noted. Psychiatric: Mood and affect are normal. Speech and behavior are normal.  ____________________________________________   LABS (all labs ordered are listed, but only abnormal results are displayed)  Labs Reviewed  CBC - Abnormal; Notable for the following:    RBC 5.43 (*)    RDW 15.8 (*)    All other components within normal limits  HEPATIC FUNCTION PANEL - Abnormal; Notable for the following:    Bilirubin, Direct <0.1 (*)    All other components within normal limits  BRAIN NATRIURETIC PEPTIDE - Abnormal; Notable for the following:    B Natriuretic Peptide 108.0 (*)    All other components within normal limits  BASIC METABOLIC PANEL  TROPONIN I   ____________________________________________  EKG  ED ECG REPORT I, Ashleigh Luckow, the attending physician, personally viewed and interpreted this ECG.  Date: 06/02/2015 EKG Time: 15:52 Rate: 55 Rhythm: Sinus bradycardia QRS Axis: normal Intervals: normal ST/T Wave abnormalities: normal Conduction Disutrbances: none Narrative Interpretation: unremarkable  ____________________________________________  RADIOLOGY   Dg Chest 2  View  06/02/2015  CLINICAL DATA:  Dizziness and bradycardia EXAM: CHEST  2 VIEW COMPARISON:  December 24, 2014 FINDINGS: There is mild bibasilar lung scarring. There is no frank edema or consolidation. Heart is borderline enlarged with pulmonary vascularity within normal limits. No adenopathy. Bones are osteoporotic. IMPRESSION: Mild cardiac enlargement. Mild bibasilar scarring without frank edema or consolidation. Bones osteoporotic. Electronically Signed   By: Lowella Grip III M.D.   On: 06/02/2015 16:56   Ct Head Wo Contrast  06/02/2015  CLINICAL DATA:  New onset dizziness EXAM: CT HEAD WITHOUT CONTRAST TECHNIQUE: Contiguous axial images were obtained from the base of the skull through the vertex without intravenous contrast. COMPARISON:  None. FINDINGS: The bony calvarium is intact. Mild atrophic changes are seen. No findings to suggest acute hemorrhage, acute infarction or space-occupying mass lesion are noted. IMPRESSION: Atrophic changes without acute abnormality. Electronically Signed   By: Inez Catalina M.D.   On: 06/02/2015 16:59    ____________________________________________   PROCEDURES  Procedure(s) performed: None  Critical Care performed: No ____________________________________________   INITIAL IMPRESSION / ASSESSMENT AND PLAN / ED COURSE  Pertinent labs & imaging results that were available during my care of the patient were reviewed by me and considered in my medical decision making (see chart for details).  The patient is well-appearing in no acute distress.  Differential diagnosis is broad and includes CVA, symptomatic bradycardia, sick sinus syndrome, silent MI, etc.  However she is in no distress at this time.  Her rhythm currently is sinus bradycardia, not a junctional rhythm, and she is breathing comfortably.  I will perform a broad workup including  head CT noncontrast, chest x-ray, and lab work, as well as orthostatic vital signs.  I discussed with the patient and  her daughter at length that if she it appears she needs a pacemaker she may require transfer for [place] since I do not believe we have electrophysiologists present.   (Note that documentation was delayed due to multiple ED patients requiring immediate care.)   The patient was observed for multiple hours in the emergency department and was able to ambulate without any symptoms or assistance.  Her workup has been completely unremarkable including extensive imaging and lab work.  I gave her a small fluid bolus of 500 mL because on her initial set of orthostatics she did have a drop of her systolic blood pressure although subsequent testing was normal.  She does appear slightly dry and she takes regular fluid pills so I believe that this is appropriate for her at this time.  She has intended to have a heart rate around 60-70 for the whole time she has been in the emergency department which is a sinus rhythm and there is no evidence of any junctional rhythm or conduction abnormalities.  I had an extensive conversation with the patient and her daughter on multiple occasions.  The patient is adamant that she wants to go home.  The daughter continues to worry about her but I explained that I find no emergent medical condition at this time and that it is appropriate for her to follow up with Dr. Saralyn Pilar as an outpatient.  Gave them my usual customary return precautions and the patient left asymptomatic and in good health and spirits.   ____________________________________________  FINAL CLINICAL IMPRESSION(S) / ED DIAGNOSES  Final diagnoses:  Symptomatic bradycardia  Dizziness      NEW MEDICATIONS STARTED DURING THIS VISIT:  Discharge Medication List as of 06/02/2015  6:39 PM       Hinda Kehr, MD 06/02/15 2038

## 2015-06-02 NOTE — ED Notes (Signed)
[facility] called to transport patient to [facility] ED.

## 2015-06-02 NOTE — ED Provider Notes (Signed)
CSN: [id number]     Arrival date & time 06/02/15  1223 History   First MD Initiated Contact with Patient 06/02/15 1440     Chief Complaint  Patient presents with  . Dizziness   (Consider location/radiation/quality/duration/timing/severity/associated sxs/prior Treatment) HPI Comments: 79 yo female with a h/o CHF, h/o COPD presents with a complaint of dizziness that started this morning on and off but became more consistent throughout the morning and afternoon; felt like she was going to pass out and some "vision changes", "blurry". Denies any chest pains or shortness of breath. States only takes gabapentin and a "water pill" and that she her Coreg was stopped in June.   Patient is a 79 y.o. female presenting with dizziness. The history is provided by the patient.  Dizziness   Past Medical History  Diagnosis Date  . Hypertension   . GERD (gastroesophageal reflux disease)   . Cancer ([place])     Liver  . Cancer ([place])     stomach  . FH: chemotherapy   . Radiation   . COPD (chronic obstructive pulmonary disease) ([place])   . CHF (congestive heart failure) ([place])   . Hyperlipidemia   . 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 Macular degeneration   . Polycythemia   . Atrial fibrillation [facility])    Past Surgical History  Procedure Laterality Date  . Colon resection    . Cataract extraction  splenectomy  . Colon surgery    . Gastrectomy    . Splenectomy     Family History  Problem Relation Age of Onset  . Heart attack Sister   . Heart attack Brother   . Leukemia Mother    Social History  Substance Use Topics  . Smoking status: Former Smoker    Types: Cigarettes  . Smokeless tobacco: None  . Alcohol Use: No   OB History    No data available     Review of Systems  Neurological: Positive for dizziness.    Allergies  No known allergies  Home Medications   Prior to Admission medications   Medication Sig Start Date End Date Taking? Authorizing Provider  albuterol  (PROVENTIL HFA;VENTOLIN HFA) 108 (90 BASE) MCG/ACT inhaler Inhale 2 puffs into the lungs every 6 (six) hours as needed for wheezing or shortness of breath.   Yes Historical Provider, MD  acetaminophen (TYLENOL) 325 MG tablet Take 2 tablets (650 mg total) by mouth every 6 (six) hours as needed for mild pain (or Fever >/= 101). 12/15/14   Tama High III, MD  albuterol (PROVENTIL) (2.5 MG/3ML) 0.083% nebulizer solution Take 3 mLs (2.5 mg total) by nebulization 3 (three) times daily. 12/15/14   Tama High III, MD  aspirin EC 81 MG tablet Take by mouth.    Historical Provider, MD  azelastine (ASTELIN) 0.1 % nasal spray Place into the nose. 10/15/14 10/15/15  Historical Provider, MD  benzocaine (ORAJEL) 10 % mucosal gel Use as directed in the mouth or throat 4 (four) times daily as needed for mouth pain. 12/15/14   Tama High III, MD  budesonide (PULMICORT) 0.5 MG/2ML nebulizer solution Take 2 mLs (0.5 mg total) by nebulization 2 (two) times daily. 12/15/14   Tama High III, MD  busPIRone (BUSPAR) 15 MG tablet Take by mouth. 09/13/13   Historical Provider, MD  carvedilol (COREG) 3.125 MG tablet 6.25 mg 2 (two) times daily with meals.  11/12/13   Historical Provider, MD  Cholecalciferol (VITAMIN D-1000 MAX ST) 1000 UNITS  tablet Take by mouth.    Historical Provider, MD  Cyanocobalamin (VITAMIN B-12 CR) 1000 MCG TBCR Take by mouth. 03/02/11   Historical Provider, MD  famotidine (PEPCID) 20 MG tablet Take by mouth.    Historical Provider, MD  furosemide (LASIX) 20 MG tablet Take by mouth. 02/22/15   Historical Provider, MD  gabapentin (NEURONTIN) 300 MG capsule Take by mouth. 09/24/13   Historical Provider, MD  Multiple Vitamins-Minerals (CENTRUM SILVER) tablet Take by mouth. 03/02/11   Historical Provider, MD  nystatin (MYCOSTATIN) 100000 UNIT/ML suspension Take by mouth. 08/01/13   Historical Provider, MD  octreotide (SANDOSTATIN LAR DEPOT) 20 MG injection Inject 20 mg into the muscle once. 12/28/14   Leia Alf, MD   ondansetron (ZOFRAN-ODT) 4 MG disintegrating tablet Take by mouth.    Historical Provider, MD  potassium chloride (K-DUR) 10 MEQ tablet  11/12/13   Historical Provider, MD  tiotropium (SPIRIVA) 18 MCG inhalation capsule Place 1 capsule (18 mcg total) into inhaler and inhale daily. 12/15/14   Adin Hector, MD   Meds Ordered and Administered this Visit  Medications - No data to display  BP 132/72 mmHg  Pulse 61  Temp(Src) 96.6 F (35.9 C) (Tympanic)  Resp 16  Ht 5' (1.524 m)  Wt 85 lb (38.556 kg)  BMI 16.60 kg/m2  SpO2 98% No data found.   Physical Exam  Constitutional: She appears well-developed and well-nourished. No distress.  Cardiovascular: Regular rhythm, S1 normal and S2 normal.  Bradycardia present.   Skin: She is not diaphoretic.  Nursing note and vitals reviewed.   ED Course  Procedures (including critical care time)  Labs Review Labs Reviewed  URINALYSIS COMPLETEWITH MICROSCOPIC [facility] ONLY) - Abnormal; Notable for the following:    Squamous Epithelial / LPF 0-5 (*)    All other components within normal limits    Imaging Review No results found.   Visual Acuity Review  Right Eye Distance:   Left Eye Distance:   Bilateral Distance:    Right Eye Near:   Left Eye Near:    Bilateral Near:      .   MDM   1. Symptomatic bradycardia   2. Dizziness   3. H/O CHF    Due to new onset symptoms (today), new onset bradycardia (unknown etiology; patient states stopped coreg back in June), V5-V6 (mild new changes; ?ST depression, not present on EKG from 12/15/14) recommend transport to ED by EMS for further evaluation and management.   Norval Gable, MD 07/01/15 [phone]

## 2015-06-18 ENCOUNTER — Inpatient Hospital Stay: Payer: Medicare Other | Attending: Oncology

## 2015-06-18 VITALS — BP 137/75 | HR 76 | Temp 97.6°F

## 2015-06-18 DIAGNOSIS — Z79899 Other long term (current) drug therapy: Secondary | ICD-10-CM | POA: Insufficient documentation

## 2015-06-18 DIAGNOSIS — D3A098 Benign carcinoid tumors of other sites: Secondary | ICD-10-CM

## 2015-06-18 DIAGNOSIS — C7B02 Secondary carcinoid tumors of liver: Secondary | ICD-10-CM | POA: Insufficient documentation

## 2015-06-18 MED ORDER — OCTREOTIDE ACETATE 20 MG IM KIT
20.0000 mg | PACK | Freq: Once | INTRAMUSCULAR | Status: AC
  Administered 2015-06-18: 20 mg via INTRAMUSCULAR

## 2015-07-16 ENCOUNTER — Inpatient Hospital Stay: Payer: Medicare Other | Attending: Oncology

## 2015-07-16 VITALS — BP 111/63 | HR 76 | Temp 96.3°F

## 2015-07-16 DIAGNOSIS — Z79899 Other long term (current) drug therapy: Secondary | ICD-10-CM | POA: Insufficient documentation

## 2015-07-16 DIAGNOSIS — D3A098 Benign carcinoid tumors of other sites: Secondary | ICD-10-CM

## 2015-07-16 DIAGNOSIS — C7B02 Secondary carcinoid tumors of liver: Secondary | ICD-10-CM | POA: Insufficient documentation

## 2015-07-16 MED ORDER — OCTREOTIDE ACETATE 20 MG IM KIT
20.0000 mg | PACK | Freq: Once | INTRAMUSCULAR | Status: AC
  Administered 2015-07-16: 20 mg via INTRAMUSCULAR

## 2015-08-13 ENCOUNTER — Inpatient Hospital Stay: Payer: Medicare Other | Attending: Oncology

## 2015-08-13 VITALS — BP 113/73 | HR 76 | Temp 96.0°F | Resp 18

## 2015-08-13 DIAGNOSIS — C7B02 Secondary carcinoid tumors of liver: Secondary | ICD-10-CM | POA: Insufficient documentation

## 2015-08-13 DIAGNOSIS — Z79899 Other long term (current) drug therapy: Secondary | ICD-10-CM | POA: Diagnosis not present

## 2015-08-13 DIAGNOSIS — D3A098 Benign carcinoid tumors of other sites: Secondary | ICD-10-CM

## 2015-08-13 MED ORDER — OCTREOTIDE ACETATE 20 MG IM KIT
20.0000 mg | PACK | Freq: Once | INTRAMUSCULAR | Status: AC
  Administered 2015-08-13: 20 mg via INTRAMUSCULAR

## 2015-08-18 ENCOUNTER — Ambulatory Visit
Admission: RE | Admit: 2015-08-18 | Discharge: 2015-08-18 | Disposition: A | Discharge status: Home / Self Care | Payer: Medicare Other | Source: Ambulatory Visit | Attending: Oncology | Admitting: Oncology

## 2015-08-18 DIAGNOSIS — D3A092 Benign carcinoid tumor of the stomach: Secondary | ICD-10-CM | POA: Insufficient documentation

## 2015-08-18 DIAGNOSIS — K8020 Calculus of gallbladder without cholecystitis without obstruction: Secondary | ICD-10-CM | POA: Insufficient documentation

## 2015-08-18 DIAGNOSIS — C7B02 Secondary carcinoid tumors of liver: Secondary | ICD-10-CM | POA: Diagnosis not present

## 2015-08-18 MED ORDER — GADOBENATE DIMEGLUMINE 529 MG/ML IV SOLN
10.0000 mL | Freq: Once | INTRAVENOUS | Status: AC | PRN
  Administered 2015-08-18: 10 mL via INTRAVENOUS

## 2015-09-10 ENCOUNTER — Inpatient Hospital Stay (HOSPITAL_BASED_OUTPATIENT_CLINIC_OR_DEPARTMENT_OTHER): Payer: Medicare Other | Admitting: Family Medicine

## 2015-09-10 ENCOUNTER — Inpatient Hospital Stay: Payer: Medicare Other

## 2015-09-10 ENCOUNTER — Inpatient Hospital Stay: Payer: Medicare Other | Attending: Oncology

## 2015-09-10 ENCOUNTER — Encounter: Payer: Self-pay | Admitting: Family Medicine

## 2015-09-10 VITALS — BP 82/59 | HR 87 | Temp 98.0°F | Resp 16 | Wt 83.8 lb

## 2015-09-10 DIAGNOSIS — K219 Gastro-esophageal reflux disease without esophagitis: Secondary | ICD-10-CM | POA: Insufficient documentation

## 2015-09-10 DIAGNOSIS — D3A098 Benign carcinoid tumors of other sites: Secondary | ICD-10-CM

## 2015-09-10 DIAGNOSIS — Z9221 Personal history of antineoplastic chemotherapy: Secondary | ICD-10-CM | POA: Insufficient documentation

## 2015-09-10 DIAGNOSIS — H3530 Unspecified macular degeneration: Secondary | ICD-10-CM | POA: Insufficient documentation

## 2015-09-10 DIAGNOSIS — D3A092 Benign carcinoid tumor of the stomach: Secondary | ICD-10-CM

## 2015-09-10 DIAGNOSIS — J449 Chronic obstructive pulmonary disease, unspecified: Secondary | ICD-10-CM | POA: Diagnosis not present

## 2015-09-10 DIAGNOSIS — I4891 Unspecified atrial fibrillation: Secondary | ICD-10-CM | POA: Insufficient documentation

## 2015-09-10 DIAGNOSIS — I10 Essential (primary) hypertension: Secondary | ICD-10-CM | POA: Diagnosis not present

## 2015-09-10 DIAGNOSIS — E785 Hyperlipidemia, unspecified: Secondary | ICD-10-CM | POA: Insufficient documentation

## 2015-09-10 DIAGNOSIS — Z87891 Personal history of nicotine dependence: Secondary | ICD-10-CM | POA: Diagnosis not present

## 2015-09-10 DIAGNOSIS — I509 Heart failure, unspecified: Secondary | ICD-10-CM | POA: Insufficient documentation

## 2015-09-10 DIAGNOSIS — Z79899 Other long term (current) drug therapy: Secondary | ICD-10-CM | POA: Diagnosis not present

## 2015-09-10 DIAGNOSIS — R9389 Abnormal findings on diagnostic imaging of other specified body structures: Secondary | ICD-10-CM

## 2015-09-10 DIAGNOSIS — C7B02 Secondary carcinoid tumors of liver: Secondary | ICD-10-CM | POA: Diagnosis present

## 2015-09-10 DIAGNOSIS — Z7982 Long term (current) use of aspirin: Secondary | ICD-10-CM | POA: Diagnosis not present

## 2015-09-10 HISTORY — DX: Abnormal findings on diagnostic imaging of other specified body structures: R93.89

## 2015-09-10 MED ORDER — OCTREOTIDE ACETATE 20 MG IM KIT
20.0000 mg | PACK | Freq: Once | INTRAMUSCULAR | Status: AC
  Administered 2015-09-10: 20 mg via INTRAMUSCULAR

## 2015-09-10 NOTE — Progress Notes (Signed)
[facility]  [phone] [phone]  ID: Tilden Fossa OB: 1935/11/10  MR#: [id number]  [id number]  Patient Care Team: Adin Hector, MD as PCP - General (Internal Medicine)  CHIEF COMPLAINT:  Chief Complaint  Patient presents with  . carinoid tumor of liver    INTERVAL HISTORY: Patient for further evaluation and continuation of monthly octreotide injections. She continues to feel well and remains asymptomatic. Patient had a recent MRI in February for reevaluation of carcinoid. MRI revealed stable disease but did note a complete to near complete atelectasis of the left lower lobe. Radiology recommended further evaluation with chest CT. She has no neurologic complaints.  She denies any recent fever or illnesses.  She has a good appetite and her weight has remained stable.  He denies any chest pain or shortness of breath.  She has no abdominal pain and denies any nausea, vomiting, diarrhea or constipation.  She has no urinary complaints.  Patient offers no specific complaints today.   REVIEW OF SYSTEMS:   Review of Systems  Constitutional: Negative.  Negative for fever, chills, weight loss, malaise/fatigue and diaphoresis.  HENT: Positive for congestion.        Sneezing related to allergies, seasonal  Eyes: Negative.   Respiratory: Negative.  Negative for cough, hemoptysis, sputum production, shortness of breath and wheezing.   Cardiovascular: Negative.  Negative for chest pain, palpitations, orthopnea, claudication, leg swelling and PND.  Gastrointestinal: Positive for diarrhea. Negative for heartburn, nausea, vomiting, abdominal pain, constipation, blood in stool and melena.  Genitourinary: Negative.   Musculoskeletal: Negative.   Skin: Negative.   Neurological: Negative.  Negative for dizziness, tingling, focal weakness, seizures and weakness.  Endo/Heme/Allergies: Does not bruise/bleed easily.  Psychiatric/Behavioral: Negative for  depression. The patient is not nervous/anxious and does not have insomnia.     As per HPI. Otherwise, a complete review of systems is negatve.  PAST MEDICAL HISTORY: Past Medical History  Diagnosis Date  . Hypertension   . GERD (gastroesophageal reflux disease)   . Cancer ([place])     Liver  . Cancer ([place])     stomach  . FH: chemotherapy   . Radiation   . COPD (chronic obstructive pulmonary disease) ([place])   . CHF (congestive heart failure) ([place])   . Hyperlipidemia   . 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 Macular degeneration   . Polycythemia   . Atrial fibrillation ([place])     PAST SURGICAL HISTORY: Past Surgical History  Procedure Laterality Date  . Colon resection    . Cataract extraction  splenectomy  . Colon surgery    . Gastrectomy    . Splenectomy      FAMILY HISTORY Family History  Problem Relation Age of Onset  . Heart attack Sister   . Heart attack Brother   . Leukemia Mother        ADVANCED DIRECTIVES:    HEALTH MAINTENANCE: Social History  Substance Use Topics  . Smoking status: Former Smoker    Types: Cigarettes  . Smokeless tobacco: Not on file  . Alcohol Use: No     Allergies  Allergen Reactions  . No Known Allergies     Current Outpatient Prescriptions  Medication Sig Dispense Refill  . acetaminophen (TYLENOL) 325 MG tablet Take 2 tablets (650 mg total) by mouth every 6 (six) hours as needed for mild pain (or Fever >/= 101). 30 tablet 1  . albuterol (PROVENTIL HFA;VENTOLIN HFA) 108 (90 BASE) MCG/ACT inhaler  Inhale 2 puffs into the lungs every 6 (six) hours as needed for wheezing or shortness of breath.    Marland Kitchen albuterol (PROVENTIL) (2.5 MG/3ML) 0.083% nebulizer solution Take 3 mLs (2.5 mg total) by nebulization 3 (three) times daily. 75 mL 12  . aspirin EC 81 MG tablet Take by mouth.    Marland Kitchen azelastine (ASTELIN) 0.1 % nasal spray Place into the nose.    . benzocaine (ORAJEL) 10 % mucosal gel Use as directed in the mouth or throat 4 (four)  times daily as needed for mouth pain. 5.3 g 0  . budesonide (PULMICORT) 0.5 MG/2ML nebulizer solution Take 2 mLs (0.5 mg total) by nebulization 2 (two) times daily. 40 mL 1  . busPIRone (BUSPAR) 15 MG tablet Take by mouth.    . carvedilol (COREG) 3.125 MG tablet 6.25 mg 2 (two) times daily with meals.     . Cholecalciferol (VITAMIN D-1000 MAX ST) 1000 UNITS tablet Take by mouth.    . Cyanocobalamin (VITAMIN B-12 CR) 1000 MCG TBCR Take by mouth.    . famotidine (PEPCID) 20 MG tablet Take by mouth.    . furosemide (LASIX) 20 MG tablet Take by mouth.    . gabapentin (NEURONTIN) 300 MG capsule Take by mouth.    . Multiple Vitamins-Minerals (CENTRUM SILVER) tablet Take by mouth.    . nystatin (MYCOSTATIN) 100000 UNIT/ML suspension Take by mouth.    Marland Kitchen octreotide (SANDOSTATIN LAR DEPOT) 20 MG injection Inject 20 mg into the muscle once. 1 each 0  . ondansetron (ZOFRAN-ODT) 4 MG disintegrating tablet Take by mouth.    . potassium chloride (K-DUR) 10 MEQ tablet     . tiotropium (SPIRIVA) 18 MCG inhalation capsule Place 1 capsule (18 mcg total) into inhaler and inhale daily. 30 capsule 12   No current facility-administered medications for this visit.    OBJECTIVE: Filed Vitals:   09/10/15 0910  BP: 82/59  Pulse: 87  Temp: 98 F (36.7 C)  Resp: 16     Body mass index is 14.37 kg/(m^2).    ECOG FS:0 - Asymptomatic  General: Well-developed, well-nourished, no acute distress. Eyes: Pink conjunctiva, anicteric sclera. Lungs: Clear to auscultation bilaterally. Heart: Regular rate and rhythm. No rubs, murmurs, or gallops. Abdomen: Soft, nontender, nondistended. No organomegaly noted, normoactive bowel sounds. Musculoskeletal: No edema, cyanosis, or clubbing. Neuro: Alert, answering all questions appropriately. Cranial nerves grossly intact. Skin: No rashes or petechiae noted. Psych: Normal affect.   LAB RESULTS:  Lab Results  Component Value Date   NA 140 06/02/2015   K 4.1 06/02/2015    CL 104 06/02/2015   CO2 28 06/02/2015   GLUCOSE 82 06/02/2015   BUN 14 06/02/2015   CREATININE 0.85 06/02/2015   CALCIUM 9.5 06/02/2015   PROT 7.4 06/02/2015   ALBUMIN 4.1 06/02/2015   AST 29 06/02/2015   ALT 14 06/02/2015   ALKPHOS 90 06/02/2015   BILITOT 0.5 06/02/2015   GFRNONAA >60 06/02/2015   GFRAA >60 06/02/2015    Lab Results  Component Value Date   WBC 8.3 06/02/2015   NEUTROABS 17.4* 01/04/2015   HGB 14.3 06/02/2015   HCT 44.0 06/02/2015   MCV 81.0 06/02/2015   PLT 228 06/02/2015     STUDIES: Mr Abdomen W Wo Contrast  08/18/2015  CLINICAL DATA:  80 year old female with history of 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 EXAM: MRI ABDOMEN WITHOUT AND WITH CONTRAST TECHNIQUE: Multiplanar multisequence MR imaging of the abdomen was performed both before and  after the administration of intravenous contrast. CONTRAST:  74mL MULTIHANCE GADOBENATE DIMEGLUMINE 529 MG/ML IV SOLN COMPARISON:  MRI of the abdomen 09/01/2014. FINDINGS: Lower chest: Volume loss in the left lower lobe with tubular areas of increased T2 signal intensity, which likely represent fluid filled bronchi in the left lower lobe. Hepatobiliary: Multiple hepatic lesions are again noted, unchanged in number and similar in size to prior examination 09/01/2014. These lesions are predominantly heterogeneously hypointense on T1 weighted images, heterogeneously hyperintense on T2 weighted images, and demonstrate heterogeneous enhancement on post gadolinium sequences. Some of these lesions restrict diffusion, including the largest lesion in the left lobe of the liver (example on image 10 of series 7). The largest of these is centered in segments 2 and 3 of the liver (image 29 of series 11), currently measuring 4.1 x 3.1 cm (previously 4.1 x 3.0 cm). Lesion in segment 8 of the liver near the dome (image 17 of series 10) appears slightly larger measuring 2.9 x 2.6 cm (previously 2.2 x 2.7 cm), but appears to  have some internal areas with lack of enhancement on today's examination, which may suggest some interval tumoral necrosis (best demonstrated along the posterior aspect of the lesion on image 16 of series 11). The other smaller lesions are essentially unchanged. In addition, there are several small T1 hypointense, T2 hyperintense, nonenhancing liver lesions which are compatible with simple cysts, the largest of which is at the junction of segments 6 and 7 measuring 14 mm in diameter. No intra or extrahepatic biliary ductal dilatation. Gallbladder is filled with numerous small gallstones, and completely contracted. No surrounding inflammatory changes. Pancreas: No pancreatic mass. No pancreatic ductal dilatation. No pancreatic or peripancreatic fluid or inflammatory changes. Spleen: Unremarkable. Adrenals/Urinary Tract: Bilateral adrenal glands and left kidney are normal in appearance. Sub cm lesion in the interpolar region of the right kidney is T1 hypointense, T2 hyperintense, and does not enhance, compatible with a tiny simple cyst. No hydroureteronephrosis in the visualized abdomen. Stomach/Bowel: Visualized portions are unremarkable. Vascular/Lymphatic: No aneurysm identified in the visualized abdominal vasculature. No lymphadenopathy noted in the abdomen. Other: No significant volume of ascites in the visualized peritoneal cavity. Musculoskeletal: No aggressive appearing osseous lesion noted in the visualized portions of the skeleton. IMPRESSION: 1. Previously noted hepatic lesions are generally similar in size, number and distribution compared to the prior examination, with exception of the lesion in the superior aspect of segment 8 which appears slightly increased in size compared to the prior examination. Whether or not this is related to actual tumoral growth, or simply reflective of developing areas of internal necrosis is uncertain. No new hepatic lesions are noted. 2. Although poorly evaluated on today's  MRI examination, there appears to be complete or near complete atelectasis of the left lower lobe. Correlation with contrast-enhanced chest CT is suggested in the near future to evaluate for potential centrally obstructing lesion. 3. Cholelithiasis without evidence of acute cholecystitis at this time. 4. Incidental findings, as above. Electronically Signed   By: Vinnie Langton M.D.   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  PLAN:   1.  Metastatic carcinoid: Patient's MRI from February 2017 shows essentially stable disease. There are incidental findings noted in the LLL of lung that shows complete or near complete atelectasis of the left ower lobe. Correlation with contrast-enhanced chest CT was suggested in the near future to evaluate for potential centrally obstructing lesion. Will schedule CT of chest with contrast.  Continue with 20  mg intramuscular or octreotide every 4 weeks. Return every 4 weeks for octreotide injections and then in 6 months for further evaluation and laboratory work.    Patient expressed understanding and was in agreement with this plan. She also understands that She can call clinic at any time with any questions, concerns, or complaints.    Evlyn Kanner, NP   09/10/2015 9:50 AM

## 2015-09-15 ENCOUNTER — Ambulatory Visit: Payer: Medicare Other

## 2015-09-15 DIAGNOSIS — C7B02 Secondary carcinoid tumors of liver: Secondary | ICD-10-CM | POA: Diagnosis not present

## 2015-09-17 LAB — 5 HIAA, QUANTITATIVE, URINE, 24 HOUR
5-HIAA, Ur: 12.2 mg/L
5-HIAA,Quant.,24 Hr Urine: 11 mg/24 hr (ref 0.0–14.9)
TOTAL VOLUME: 900

## 2015-09-20 ENCOUNTER — Other Ambulatory Visit
Admission: RE | Admit: 2015-09-20 | Discharge: 2015-09-20 | Disposition: A | Discharge status: Home / Self Care | Payer: Medicare Other | Source: Ambulatory Visit | Attending: Family Medicine | Admitting: Family Medicine

## 2015-09-20 ENCOUNTER — Ambulatory Visit
Admission: RE | Admit: 2015-09-20 | Discharge: 2015-09-20 | Disposition: A | Discharge status: Home / Self Care | Payer: Medicare Other | Source: Ambulatory Visit | Attending: Family Medicine | Admitting: Family Medicine

## 2015-09-20 DIAGNOSIS — I2510 Atherosclerotic heart disease of native coronary artery without angina pectoris: Secondary | ICD-10-CM | POA: Diagnosis not present

## 2015-09-20 DIAGNOSIS — R9389 Abnormal findings on diagnostic imaging of other specified body structures: Secondary | ICD-10-CM

## 2015-09-20 DIAGNOSIS — K769 Liver disease, unspecified: Secondary | ICD-10-CM | POA: Insufficient documentation

## 2015-09-20 DIAGNOSIS — R938 Abnormal findings on diagnostic imaging of other specified body structures: Secondary | ICD-10-CM | POA: Diagnosis present

## 2015-09-20 DIAGNOSIS — J9819 Other pulmonary collapse: Secondary | ICD-10-CM | POA: Insufficient documentation

## 2015-09-20 DIAGNOSIS — D3A098 Benign carcinoid tumors of other sites: Secondary | ICD-10-CM

## 2015-09-20 LAB — CBC WITH DIFFERENTIAL/PLATELET
BASOS PCT: 1 %
Basophils Absolute: 0.1 10*3/uL (ref 0–0.1)
EOS ABS: 0.1 10*3/uL (ref 0–0.7)
EOS PCT: 1 %
HCT: 45.7 % (ref 35.0–47.0)
Hemoglobin: 15.1 g/dL (ref 12.0–16.0)
LYMPHS ABS: 2.1 10*3/uL (ref 1.0–3.6)
LYMPHS PCT: 27 %
MCH: 27.6 pg (ref 26.0–34.0)
MCHC: 33 g/dL (ref 32.0–36.0)
MCV: 83.7 fL (ref 80.0–100.0)
MONO ABS: 0.5 10*3/uL (ref 0.2–0.9)
MONOS PCT: 7 %
NEUTROS ABS: 5.1 10*3/uL (ref 1.4–6.5)
Neutrophils Relative %: 64 %
PLATELETS: 195 10*3/uL (ref 150–440)
RBC: 5.45 MIL/uL — ABNORMAL HIGH (ref 3.80–5.20)
RDW: 15.1 % — ABNORMAL HIGH (ref 11.5–14.5)
WBC: 8 10*3/uL (ref 3.6–11.0)

## 2015-09-20 LAB — COMPREHENSIVE METABOLIC PANEL
ALBUMIN: 4 g/dL (ref 3.5–5.0)
ALT: 14 U/L (ref 14–54)
AST: 24 U/L (ref 15–41)
Alkaline Phosphatase: 95 U/L (ref 38–126)
Anion gap: 4 — ABNORMAL LOW (ref 5–15)
BUN: 14 mg/dL (ref 6–20)
CALCIUM: 9 mg/dL (ref 8.9–10.3)
CHLORIDE: 105 mmol/L (ref 101–111)
CO2: 28 mmol/L (ref 22–32)
Creatinine, Ser: 0.85 mg/dL (ref 0.44–1.00)
GFR calc non Af Amer: >60 mL/min (ref >60)
Glucose, Bld: 109 mg/dL — ABNORMAL HIGH (ref 65–99)
POTASSIUM: 3.8 mmol/L (ref 3.5–5.1)
SODIUM: 137 mmol/L (ref 135–145)
Total Bilirubin: 0.5 mg/dL (ref 0.3–1.2)
Total Protein: 7.3 g/dL (ref 6.5–8.1)

## 2015-09-20 MED ORDER — IOHEXOL 300 MG/ML  SOLN
75.0000 mL | Freq: Once | INTRAMUSCULAR | Status: AC | PRN
  Administered 2015-09-20: 75 mL via INTRAVENOUS

## 2015-10-15 ENCOUNTER — Inpatient Hospital Stay: Payer: Medicare Other | Attending: Oncology

## 2015-10-15 DIAGNOSIS — Z79899 Other long term (current) drug therapy: Secondary | ICD-10-CM | POA: Diagnosis not present

## 2015-10-15 DIAGNOSIS — D3A098 Benign carcinoid tumors of other sites: Secondary | ICD-10-CM

## 2015-10-15 DIAGNOSIS — C7B02 Secondary carcinoid tumors of liver: Secondary | ICD-10-CM | POA: Diagnosis present

## 2015-10-15 MED ORDER — OCTREOTIDE ACETATE 20 MG IM KIT
20.0000 mg | PACK | Freq: Once | INTRAMUSCULAR | Status: AC
  Administered 2015-10-15: 20 mg via INTRAMUSCULAR

## 2015-10-15 NOTE — Progress Notes (Signed)
Pt here today for octreotide injection. She states the diarrhea returns about 1 1/2 weeks before it is time for her next injection. Appetite is fair, up and down. Has Oxygen that she wears every night and prn during the day. Has permanent  nerve pain R ear area from previous bout shingles; she takes neurontin for this pain. Has chronic leg pain and cramping mostly at nighttime.

## 2015-11-12 ENCOUNTER — Inpatient Hospital Stay: Payer: Medicare Other | Attending: Oncology

## 2015-11-12 VITALS — BP 121/70 | HR 71 | Temp 96.9°F | Resp 16

## 2015-11-12 DIAGNOSIS — Z79899 Other long term (current) drug therapy: Secondary | ICD-10-CM | POA: Diagnosis not present

## 2015-11-12 DIAGNOSIS — C7B02 Secondary carcinoid tumors of liver: Secondary | ICD-10-CM | POA: Insufficient documentation

## 2015-11-12 DIAGNOSIS — D3A098 Benign carcinoid tumors of other sites: Secondary | ICD-10-CM

## 2015-11-12 MED ORDER — OCTREOTIDE ACETATE 20 MG IM KIT
20.0000 mg | PACK | Freq: Once | INTRAMUSCULAR | Status: AC
  Administered 2015-11-12: 20 mg via INTRAMUSCULAR

## 2015-12-10 ENCOUNTER — Inpatient Hospital Stay: Payer: Medicare Other | Attending: Oncology

## 2015-12-10 VITALS — BP 111/73 | HR 62 | Resp 20

## 2015-12-10 DIAGNOSIS — C7B02 Secondary carcinoid tumors of liver: Secondary | ICD-10-CM | POA: Insufficient documentation

## 2015-12-10 DIAGNOSIS — Z79899 Other long term (current) drug therapy: Secondary | ICD-10-CM | POA: Insufficient documentation

## 2015-12-10 DIAGNOSIS — D3A098 Benign carcinoid tumors of other sites: Secondary | ICD-10-CM

## 2015-12-10 MED ORDER — OCTREOTIDE ACETATE 20 MG IM KIT
20.0000 mg | PACK | Freq: Once | INTRAMUSCULAR | Status: AC
  Administered 2015-12-10: 20 mg via INTRAMUSCULAR

## 2016-01-14 ENCOUNTER — Inpatient Hospital Stay: Payer: Medicare Other | Attending: Oncology

## 2016-01-14 VITALS — BP 134/73 | HR 69 | Temp 97.0°F | Resp 20

## 2016-01-14 DIAGNOSIS — Z79899 Other long term (current) drug therapy: Secondary | ICD-10-CM | POA: Diagnosis not present

## 2016-01-14 DIAGNOSIS — D3A098 Benign carcinoid tumors of other sites: Secondary | ICD-10-CM

## 2016-01-14 DIAGNOSIS — C7B02 Secondary carcinoid tumors of liver: Secondary | ICD-10-CM | POA: Diagnosis not present

## 2016-01-14 MED ORDER — OCTREOTIDE ACETATE 20 MG IM KIT
20.0000 mg | PACK | Freq: Once | INTRAMUSCULAR | Status: AC
  Administered 2016-01-14: 20 mg via INTRAMUSCULAR

## 2016-02-11 ENCOUNTER — Other Ambulatory Visit: Payer: Self-pay | Admitting: Oncology

## 2016-02-11 ENCOUNTER — Inpatient Hospital Stay: Payer: Medicare Other | Attending: Oncology

## 2016-02-11 VITALS — BP 144/58 | HR 63 | Temp 98.1°F

## 2016-02-11 DIAGNOSIS — Z79899 Other long term (current) drug therapy: Secondary | ICD-10-CM | POA: Diagnosis not present

## 2016-02-11 DIAGNOSIS — C7B02 Secondary carcinoid tumors of liver: Secondary | ICD-10-CM | POA: Insufficient documentation

## 2016-02-11 DIAGNOSIS — D3A098 Benign carcinoid tumors of other sites: Secondary | ICD-10-CM

## 2016-02-11 MED ORDER — OCTREOTIDE ACETATE 20 MG IM KIT
20.0000 mg | PACK | Freq: Once | INTRAMUSCULAR | Status: AC
  Administered 2016-02-11: 20 mg via INTRAMUSCULAR
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
## 2016-03-10 ENCOUNTER — Inpatient Hospital Stay (HOSPITAL_BASED_OUTPATIENT_CLINIC_OR_DEPARTMENT_OTHER): Payer: Medicare Other | Admitting: Oncology

## 2016-03-10 ENCOUNTER — Inpatient Hospital Stay: Payer: Medicare Other | Attending: Oncology

## 2016-03-10 VITALS — BP 131/78 | HR 81 | Temp 97.9°F | Resp 18 | Wt 82.8 lb

## 2016-03-10 DIAGNOSIS — Z923 Personal history of irradiation: Secondary | ICD-10-CM | POA: Insufficient documentation

## 2016-03-10 DIAGNOSIS — E785 Hyperlipidemia, unspecified: Secondary | ICD-10-CM | POA: Diagnosis not present

## 2016-03-10 DIAGNOSIS — I509 Heart failure, unspecified: Secondary | ICD-10-CM | POA: Insufficient documentation

## 2016-03-10 DIAGNOSIS — I10 Essential (primary) hypertension: Secondary | ICD-10-CM | POA: Insufficient documentation

## 2016-03-10 DIAGNOSIS — Z79899 Other long term (current) drug therapy: Secondary | ICD-10-CM

## 2016-03-10 DIAGNOSIS — K219 Gastro-esophageal reflux disease without esophagitis: Secondary | ICD-10-CM | POA: Insufficient documentation

## 2016-03-10 DIAGNOSIS — J449 Chronic obstructive pulmonary disease, unspecified: Secondary | ICD-10-CM | POA: Insufficient documentation

## 2016-03-10 DIAGNOSIS — D3A098 Benign carcinoid tumors of other sites: Secondary | ICD-10-CM

## 2016-03-10 DIAGNOSIS — Z9221 Personal history of antineoplastic chemotherapy: Secondary | ICD-10-CM

## 2016-03-10 DIAGNOSIS — I4891 Unspecified atrial fibrillation: Secondary | ICD-10-CM | POA: Diagnosis not present

## 2016-03-10 DIAGNOSIS — J181 Lobar pneumonia, unspecified organism: Secondary | ICD-10-CM

## 2016-03-10 DIAGNOSIS — C7B02 Secondary carcinoid tumors of liver: Secondary | ICD-10-CM | POA: Insufficient documentation

## 2016-03-10 DIAGNOSIS — C7A029 Malignant carcinoid tumor of the large intestine, unspecified portion: Secondary | ICD-10-CM | POA: Insufficient documentation

## 2016-03-10 MED ORDER — OCTREOTIDE ACETATE 20 MG IM KIT
20.0000 mg | PACK | Freq: Once | INTRAMUSCULAR | Status: AC
  Administered 2016-03-10: 20 mg via INTRAMUSCULAR

## 2016-03-10 NOTE — Progress Notes (Signed)
Offers no complaints. Feeling well. 

## 2016-04-07 ENCOUNTER — Ambulatory Visit: Payer: Medicare Other

## 2016-04-07 ENCOUNTER — Ambulatory Visit
Admission: EM | Admit: 2016-04-07 | Discharge: 2016-04-07 | Disposition: A | Discharge status: Home / Self Care | Payer: Medicare Other | Attending: Family Medicine | Admitting: Family Medicine

## 2016-04-07 ENCOUNTER — Encounter: Payer: Self-pay | Admitting: Emergency Medicine

## 2016-04-07 DIAGNOSIS — M25512 Pain in left shoulder: Secondary | ICD-10-CM | POA: Diagnosis present

## 2016-04-07 DIAGNOSIS — S42292A Other displaced fracture of upper end of left humerus, initial encounter for closed fracture: Secondary | ICD-10-CM | POA: Diagnosis not present

## 2016-04-07 DIAGNOSIS — W19XXXA Unspecified fall, initial encounter: Secondary | ICD-10-CM | POA: Insufficient documentation

## 2016-04-07 DIAGNOSIS — S42302A Unspecified fracture of shaft of humerus, left arm, initial encounter for closed fracture: Secondary | ICD-10-CM | POA: Diagnosis not present

## 2016-04-07 MED ORDER — HYDROCODONE-ACETAMINOPHEN 5-325 MG PO TABS: ORAL_TABLET | ORAL | 0 refills | Status: DC

## 2016-04-07 NOTE — ED Provider Notes (Signed)
[facility]    CSN: [id number] Arrival date & time: 04/07/16  1250     History   Chief Complaint Chief Complaint  Patient presents with  . Shoulder Pain    left  . Fall    HPI Veronica Wang is a 80 y.o. female.   The history is provided by the patient.  Shoulder Pain  Location:  Shoulder Shoulder location:  L shoulder Injury: yes   Mechanism of injury: fall   Fall:    Fall occurred:  Down stairs   Impact surface:  Hard floor   Point of impact: left shoulder.   Entrapped after fall: no   Handedness:  Right-handed Dislocation: no   Foreign body present:  No foreign bodies Relieved by:  Nothing Associated symptoms: decreased range of motion and muscle weakness   Associated symptoms: no back pain, no fatigue, no fever, no neck pain, no numbness and no stiffness   Risk factors: no frequent fractures and no recent illness   Fall     Past Medical History:  Diagnosis Date  . Abnormal MRI 09/10/2015  . Atrial fibrillation ([place])   . Cancer ([place])    Liver  . Cancer ([place])    stomach  . 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 CHF (congestive heart failure) ([place])   . COPD (chronic obstructive pulmonary disease) ([place])   . FH: chemotherapy   . GERD (gastroesophageal reflux disease)   . Hyperlipidemia   . Hypertension   . Macular degeneration   . Polycythemia   . Radiation     Patient Active Problem List   Diagnosis Date Noted  . Abnormal MRI 09/10/2015  . Malnutrition of moderate degree ([place]) 12/06/2014  . Acute respiratory failure with hypoxia ([place]) 12/05/2014  . Pneumonia 12/05/2014  . Septic shock ([place]) 12/05/2014  . COPD (chronic obstructive pulmonary disease) ([place]) 12/05/2014  . 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 Chronic systolic heart failure ([place]) 11/10/2014    Past Surgical History:  Procedure Laterality Date  . CATARACT EXTRACTION  splenectomy  . COLON RESECTION    . COLON SURGERY    . GASTRECTOMY    . SPLENECTOMY        OB History    No data available       Home Medications    Prior to Admission medications   Medication Sig Start Date End Date Taking? Authorizing Provider  acetaminophen (TYLENOL) 325 MG tablet Take 2 tablets (650 mg total) by mouth every 6 (six) hours as needed for mild pain (or Fever >/= 101). 12/15/14   Tama High III, MD  albuterol (PROVENTIL HFA;VENTOLIN HFA) 108 (90 BASE) MCG/ACT inhaler Inhale 2 puffs into the lungs every 6 (six) hours as needed for wheezing or shortness of breath.    Historical Provider, MD  albuterol (PROVENTIL) (2.5 MG/3ML) 0.083% nebulizer solution Take 3 mLs (2.5 mg total) by nebulization 3 (three) times daily. 12/15/14   Tama High III, MD  aspirin EC 81 MG tablet Take by mouth.    Historical Provider, MD  azelastine (ASTELIN) 0.1 % nasal spray Place into the nose. 10/15/14 03/10/16  Historical Provider, MD  benzocaine (ORAJEL) 10 % mucosal gel Use as directed in the mouth or throat 4 (four) times daily as needed for mouth pain. 12/15/14   Tama High III, MD  budesonide (PULMICORT) 0.5 MG/2ML nebulizer solution Take 2 mLs (0.5 mg total) by nebulization 2 (two) times daily. 12/15/14   Eustace Moore  Briscoe Burns III, MD  busPIRone (BUSPAR) 15 MG tablet Take by mouth. 09/13/13   Historical Provider, MD  carvedilol (COREG) 3.125 MG tablet 6.25 mg 2 (two) times daily with meals.  11/12/13   Historical Provider, MD  Cholecalciferol (VITAMIN D-1000 MAX ST) 1000 UNITS tablet Take by mouth.    Historical Provider, MD  Cyanocobalamin (VITAMIN B-12 CR) 1000 MCG TBCR Take by mouth. 03/02/11   Historical Provider, MD  famotidine (PEPCID) 20 MG tablet Take by mouth.    Historical Provider, MD  furosemide (LASIX) 20 MG tablet Take 20 mg by mouth. Takes 1 every other day 02/22/15   Historical Provider, MD  gabapentin (NEURONTIN) 300 MG capsule Take 300 mg by mouth 3 (three) times daily.  09/24/13   Historical Provider, MD  HYDROcodone-acetaminophen (NORCO/VICODIN) 5-325 MG tablet 1-2 tabs po qd prn  04/07/16   Norval Gable, MD  Multiple Vitamins-Minerals (CENTRUM SILVER) tablet Take by mouth. 03/02/11   Historical Provider, MD  octreotide (SANDOSTATIN LAR DEPOT) 20 MG injection Inject 20 mg into the muscle once. 12/28/14   Leia Alf, MD  ondansetron (ZOFRAN-ODT) 4 MG disintegrating tablet Take by mouth.    Historical Provider, MD  potassium chloride (K-DUR) 10 MEQ tablet  11/12/13   Historical Provider, MD  tiotropium (SPIRIVA) 18 MCG inhalation capsule Place 1 capsule (18 mcg total) into inhaler and inhale daily. 12/15/14   Adin Hector, MD    Family History Family History  Problem Relation Age of Onset  . Heart attack Sister   . Heart attack Brother   . Leukemia Mother     Social History Social History  Substance Use Topics  . Smoking status: Former Smoker    Types: Cigarettes  . Smokeless tobacco: Never Used  . Alcohol use No     Allergies   No known allergies   Review of Systems Review of Systems  Constitutional: Negative for fatigue and fever.  Musculoskeletal: Negative for back pain, neck pain and stiffness.     Physical Exam Triage Vital Signs ED Triage Vitals  Enc Vitals Group     BP 04/07/16 1409 (!) 150/68     Pulse Rate 04/07/16 1409 66     Resp 04/07/16 1409 16     Temp 04/07/16 1409 97 F (36.1 C)     Temp Source 04/07/16 1409 Tympanic     SpO2 04/07/16 1409 96 %     Weight 04/07/16 1408 82 lb (37.2 kg)     Height 04/07/16 1408 5' (1.524 m)     Head Circumference --      Peak Flow --      Pain Score 04/07/16 1410 5     Pain Loc --      Pain Edu? --      Excl. in [place]? --    No data found.   Updated Vital Signs BP (!) 150/68 (BP Location: Right Arm)   Pulse 66   Temp 97 F (36.1 C) (Tympanic)   Resp 16   Ht 5' (1.524 m)   Wt 82 lb (37.2 kg)   SpO2 96%   BMI 16.01 kg/m   Visual Acuity Right Eye Distance:   Left Eye Distance:   Bilateral Distance:    Right Eye Near:   Left Eye Near:    Bilateral Near:     Physical Exam    Constitutional: She appears well-developed and well-nourished. No distress.  Musculoskeletal:       Left shoulder: She exhibits  decreased range of motion, tenderness, bony tenderness, swelling and decreased strength. She exhibits no effusion, no crepitus, no deformity, no laceration, no pain, no spasm and normal pulse.  Left upper extremity neurovascularly intact  Skin: She is not diaphoretic.  Nursing note and vitals reviewed.    UC Treatments / Results  Labs (all labs ordered are listed, but only abnormal results are displayed) Labs Reviewed - No data to display  EKG  EKG Interpretation None       Radiology Dg Shoulder Left  Result Date: 04/07/2016 CLINICAL DATA:  Status post fall this morning with onset of left shoulder pain. EXAM: LEFT SHOULDER - 2+ VIEW COMPARISON:  None. FINDINGS: The humeral head is located. The patient has a mildly impacted fracture of the surgical neck of the humerus. A bone fragment is seen off the posterior margin of the humeral head. No donor site is visualized. The acromioclavicular joint is intact. Imaged left lung and ribs appear normal. IMPRESSION: Acute, mildly impacted surgical neck fracture left humerus. Electronically Signed   By: Inge Rise M.D.   On: 04/07/2016 15:00    Procedures Procedures (including critical care time)  Medications Ordered in UC Medications - No data to display   Initial Impression / Assessment and Plan / UC Course  I have reviewed the triage vital signs and the nursing notes.  Pertinent labs & imaging results that were available during my care of the patient were reviewed by me and considered in my medical decision making (see chart for details).  Clinical Course      Final Clinical Impressions(s) / UC Diagnoses   Final diagnoses:  Humerus head fracture, left, closed, initial encounter    New Prescriptions Discharge Medication List as of 04/07/2016  3:14 PM    START taking these medications    Details  HYDROcodone-acetaminophen (NORCO/VICODIN) 5-325 MG tablet 1-2 tabs po qd prn, Print        1. x-ray results and diagnosis reviewed with patient and daughter 2. rx as per orders above; reviewed possible side effects, interactions, risks and benefits  3. Recommend treatment with sling and follow up with orthopedist next week 4. Follow-up prn if symptoms worsen or don't improve   Norval Gable, MD 04/07/16 2052

## 2016-04-07 NOTE — ED Triage Notes (Signed)
Patient states that she was watering her flowers and her flip flops got wet and she slipped and found down the stairs and hit the left side of her face, left shoulder and left knee.

## 2016-05-25 ENCOUNTER — Inpatient Hospital Stay: Payer: Medicare Other | Attending: Oncology | Admitting: Oncology

## 2016-05-25 ENCOUNTER — Inpatient Hospital Stay: Payer: Medicare Other

## 2016-05-25 VITALS — BP 134/80 | HR 78 | Temp 97.9°F | Resp 16 | Wt 80.6 lb

## 2016-05-25 DIAGNOSIS — I4891 Unspecified atrial fibrillation: Secondary | ICD-10-CM | POA: Insufficient documentation

## 2016-05-25 DIAGNOSIS — I509 Heart failure, unspecified: Secondary | ICD-10-CM | POA: Diagnosis not present

## 2016-05-25 DIAGNOSIS — Z9221 Personal history of antineoplastic chemotherapy: Secondary | ICD-10-CM | POA: Diagnosis not present

## 2016-05-25 DIAGNOSIS — C7A029 Malignant carcinoid tumor of the large intestine, unspecified portion: Secondary | ICD-10-CM | POA: Insufficient documentation

## 2016-05-25 DIAGNOSIS — J449 Chronic obstructive pulmonary disease, unspecified: Secondary | ICD-10-CM | POA: Insufficient documentation

## 2016-05-25 DIAGNOSIS — C7B02 Secondary carcinoid tumors of liver: Secondary | ICD-10-CM | POA: Diagnosis not present

## 2016-05-25 DIAGNOSIS — D3A098 Benign carcinoid tumors of other sites: Secondary | ICD-10-CM

## 2016-05-25 DIAGNOSIS — I10 Essential (primary) hypertension: Secondary | ICD-10-CM | POA: Diagnosis not present

## 2016-05-25 DIAGNOSIS — K219 Gastro-esophageal reflux disease without esophagitis: Secondary | ICD-10-CM | POA: Diagnosis not present

## 2016-05-25 DIAGNOSIS — E876 Hypokalemia: Secondary | ICD-10-CM

## 2016-05-25 DIAGNOSIS — Z79899 Other long term (current) drug therapy: Secondary | ICD-10-CM | POA: Insufficient documentation

## 2016-05-25 DIAGNOSIS — Z923 Personal history of irradiation: Secondary | ICD-10-CM | POA: Diagnosis not present

## 2016-05-25 DIAGNOSIS — E785 Hyperlipidemia, unspecified: Secondary | ICD-10-CM | POA: Diagnosis not present

## 2016-05-25 LAB — CBC WITH DIFFERENTIAL/PLATELET
BASOS ABS: 0.1 10*3/uL (ref 0–0.1)
Basophils Relative: 1 %
EOS PCT: 1 %
Eosinophils Absolute: 0.1 10*3/uL (ref 0–0.7)
HEMATOCRIT: 48.2 % — AB (ref 35.0–47.0)
Hemoglobin: 16.1 g/dL — ABNORMAL HIGH (ref 12.0–16.0)
LYMPHS ABS: 2.6 10*3/uL (ref 1.0–3.6)
LYMPHS PCT: 23 %
MCH: 27.8 pg (ref 26.0–34.0)
MCHC: 33.5 g/dL (ref 32.0–36.0)
MCV: 83.2 fL (ref 80.0–100.0)
MONO ABS: 0.8 10*3/uL (ref 0.2–0.9)
Monocytes Relative: 7 %
NEUTROS ABS: 7.7 10*3/uL — AB (ref 1.4–6.5)
Neutrophils Relative %: 68 %
PLATELETS: 257 10*3/uL (ref 150–440)
RBC: 5.8 MIL/uL — ABNORMAL HIGH (ref 3.80–5.20)
RDW: 14.7 % — AB (ref 11.5–14.5)
WBC: 11.2 10*3/uL — ABNORMAL HIGH (ref 3.6–11.0)

## 2016-05-25 NOTE — Progress Notes (Signed)
Patient states that she has been having acid reflux, concerned about it coming from her cancer.

## 2016-05-26 LAB — COMPREHENSIVE METABOLIC PANEL
ALT: 16 U/L (ref 14–54)
AST: 32 U/L (ref 15–41)
Albumin: 4.4 g/dL (ref 3.5–5.0)
Alkaline Phosphatase: 102 U/L (ref 38–126)
Anion gap: 12 (ref 5–15)
BILIRUBIN TOTAL: 0.6 mg/dL (ref 0.3–1.2)
BUN: 12 mg/dL (ref 6–20)
CO2: 27 mmol/L (ref 22–32)
CREATININE: 0.67 mg/dL (ref 0.44–1.00)
Calcium: 9.6 mg/dL (ref 8.9–10.3)
Chloride: 101 mmol/L (ref 101–111)
Glucose, Bld: 89 mg/dL (ref 65–99)
POTASSIUM: 2.7 mmol/L — AB (ref 3.5–5.1)
Sodium: 140 mmol/L (ref 135–145)
TOTAL PROTEIN: 8.3 g/dL — AB (ref 6.5–8.1)

## 2016-05-26 MED ORDER — POTASSIUM CHLORIDE ER 10 MEQ PO TBCR: 20.0000 meq | EXTENDED_RELEASE_TABLET | Freq: Two times a day (BID) | ORAL | 2 refills | Status: DC
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
## 2016-05-30 LAB — CHROMOGRANIN A: Chromogranin A: 42 nmol/L — ABNORMAL HIGH (ref 0–5)

## 2016-06-06 DIAGNOSIS — C7A029 Malignant carcinoid tumor of the large intestine, unspecified portion: Secondary | ICD-10-CM | POA: Diagnosis not present

## 2016-06-07 ENCOUNTER — Other Ambulatory Visit: Payer: Self-pay | Admitting: *Deleted

## 2016-06-07 DIAGNOSIS — D3A098 Benign carcinoid tumors of other sites: Secondary | ICD-10-CM

## 2016-06-13 ENCOUNTER — Ambulatory Visit
Admission: RE | Admit: 2016-06-13 | Discharge: 2016-06-13 | Disposition: A | Discharge status: Home / Self Care | Payer: Medicare Other | Source: Ambulatory Visit | Attending: Oncology | Admitting: Oncology

## 2016-06-13 DIAGNOSIS — C787 Secondary malignant neoplasm of liver and intrahepatic bile duct: Secondary | ICD-10-CM | POA: Diagnosis not present

## 2016-06-13 DIAGNOSIS — D3A098 Benign carcinoid tumors of other sites: Secondary | ICD-10-CM | POA: Insufficient documentation

## 2016-06-13 MED ORDER — GADOBENATE DIMEGLUMINE 529 MG/ML IV SOLN
8.0000 mL | Freq: Once | INTRAVENOUS | Status: AC | PRN
  Administered 2016-06-13: 8 mL via INTRAVENOUS

## 2016-06-14 LAB — 5 HIAA, QUANTITATIVE, URINE, 24 HOUR
5-HIAA, Ur: 107 mg/L
5-HIAA,Quant.,24 Hr Urine: 42.8 mg/24 hr — ABNORMAL HIGH (ref 0.0–14.9)
TOTAL VOLUME: 400

## 2016-06-15 IMAGING — CR DG CHEST 1V
1 series · 1 of 1 positions shown · non-contrast
Comparison: 06/12/2014

CLINICAL DATA: Difficulty breathing with hypoxia

EXAM:
CHEST  1 VIEW

[portable]
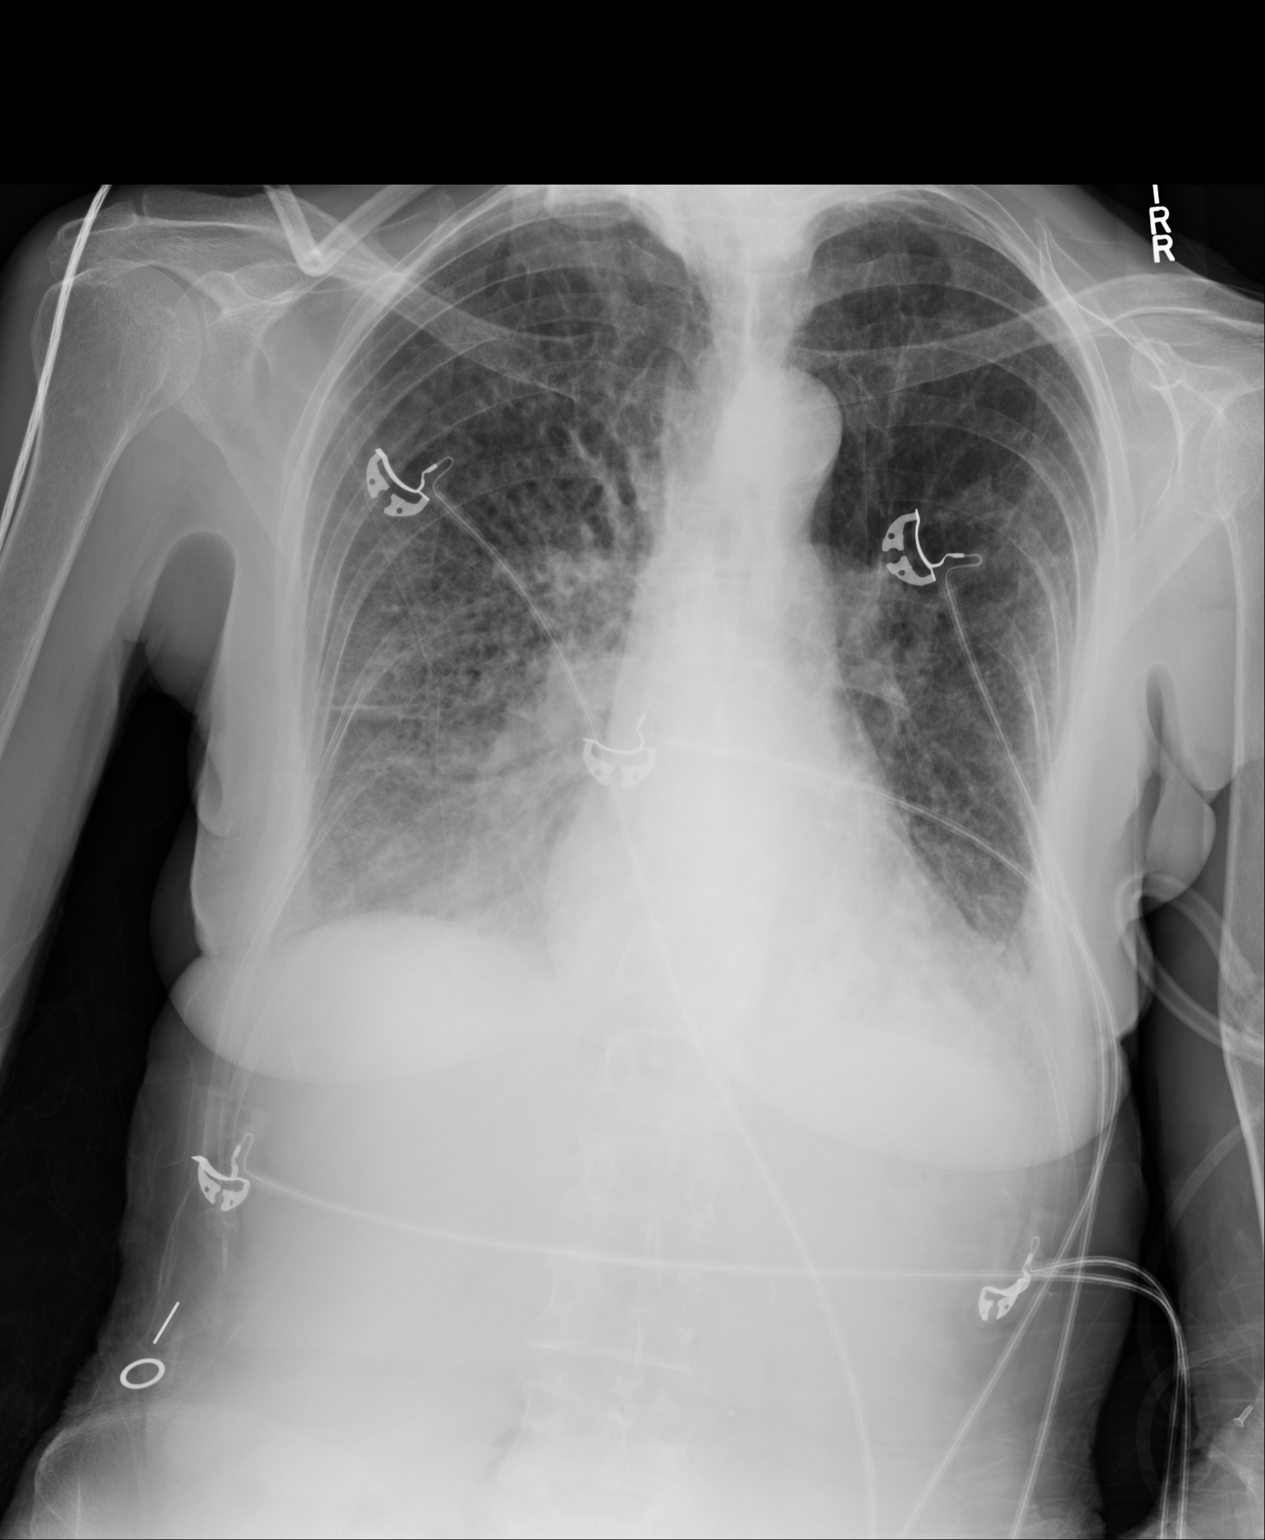

[1 of 1 positions shown; findings below may reference images not displayed]

FINDINGS: Cardiac shadow is within normal limits. Diffuse infiltrative density
is noted projecting in the right lower lobe. Minimal left basilar
changes are noted as well. Mild vascular congestion and interstitial
edema is noted.
IMPRESSION: Bilateral lower lobe infiltrates superimposed over vascular
congestion.

## 2016-06-18 IMAGING — CR DG CHEST 1V PORT
1 series · 1 of 1 positions shown · non-contrast
Comparison: 12/04/2014

CLINICAL DATA: Bilateral pneumonia and septic shock

EXAM:
PORTABLE CHEST - 1 VIEW

[ap]
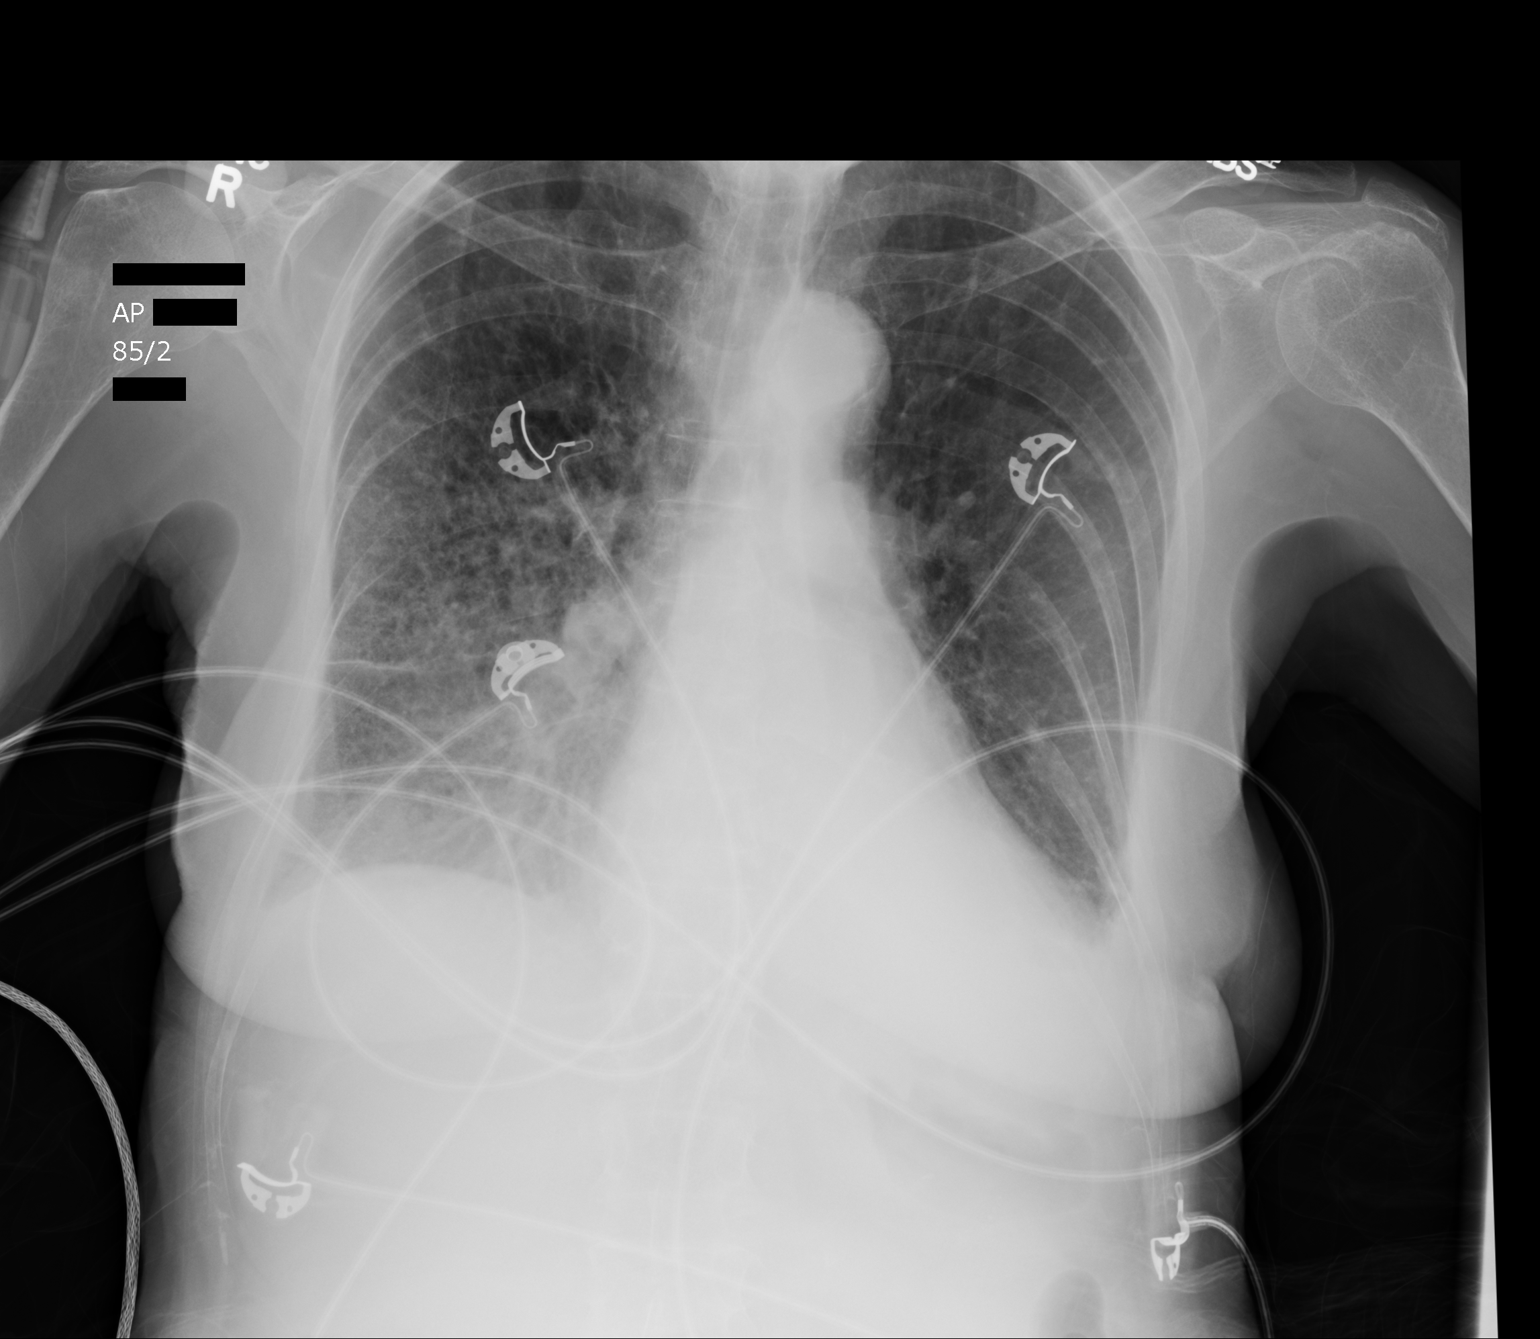

[1 of 1 positions shown; findings below may reference images not displayed]

FINDINGS: Normal heart size. There are bilateral pleural effusions and mild
interstitial edema consistent with CHF. Right midlung and right base
airspace disease is identified consistent with pneumonia.
IMPRESSION: 1. CHF with superimposed right lung pneumonia.

## 2016-06-19 IMAGING — CR DG CHEST 1V
1 series · 1 of 1 positions shown · non-contrast
Comparison: One-view chest x-ray 12/07/2014.

CLINICAL DATA: Bilateral lower lobe pneumonia and sepsis. Acute on
chronic respiratory failure.

EXAM:
CHEST  1 VIEW

[ap]
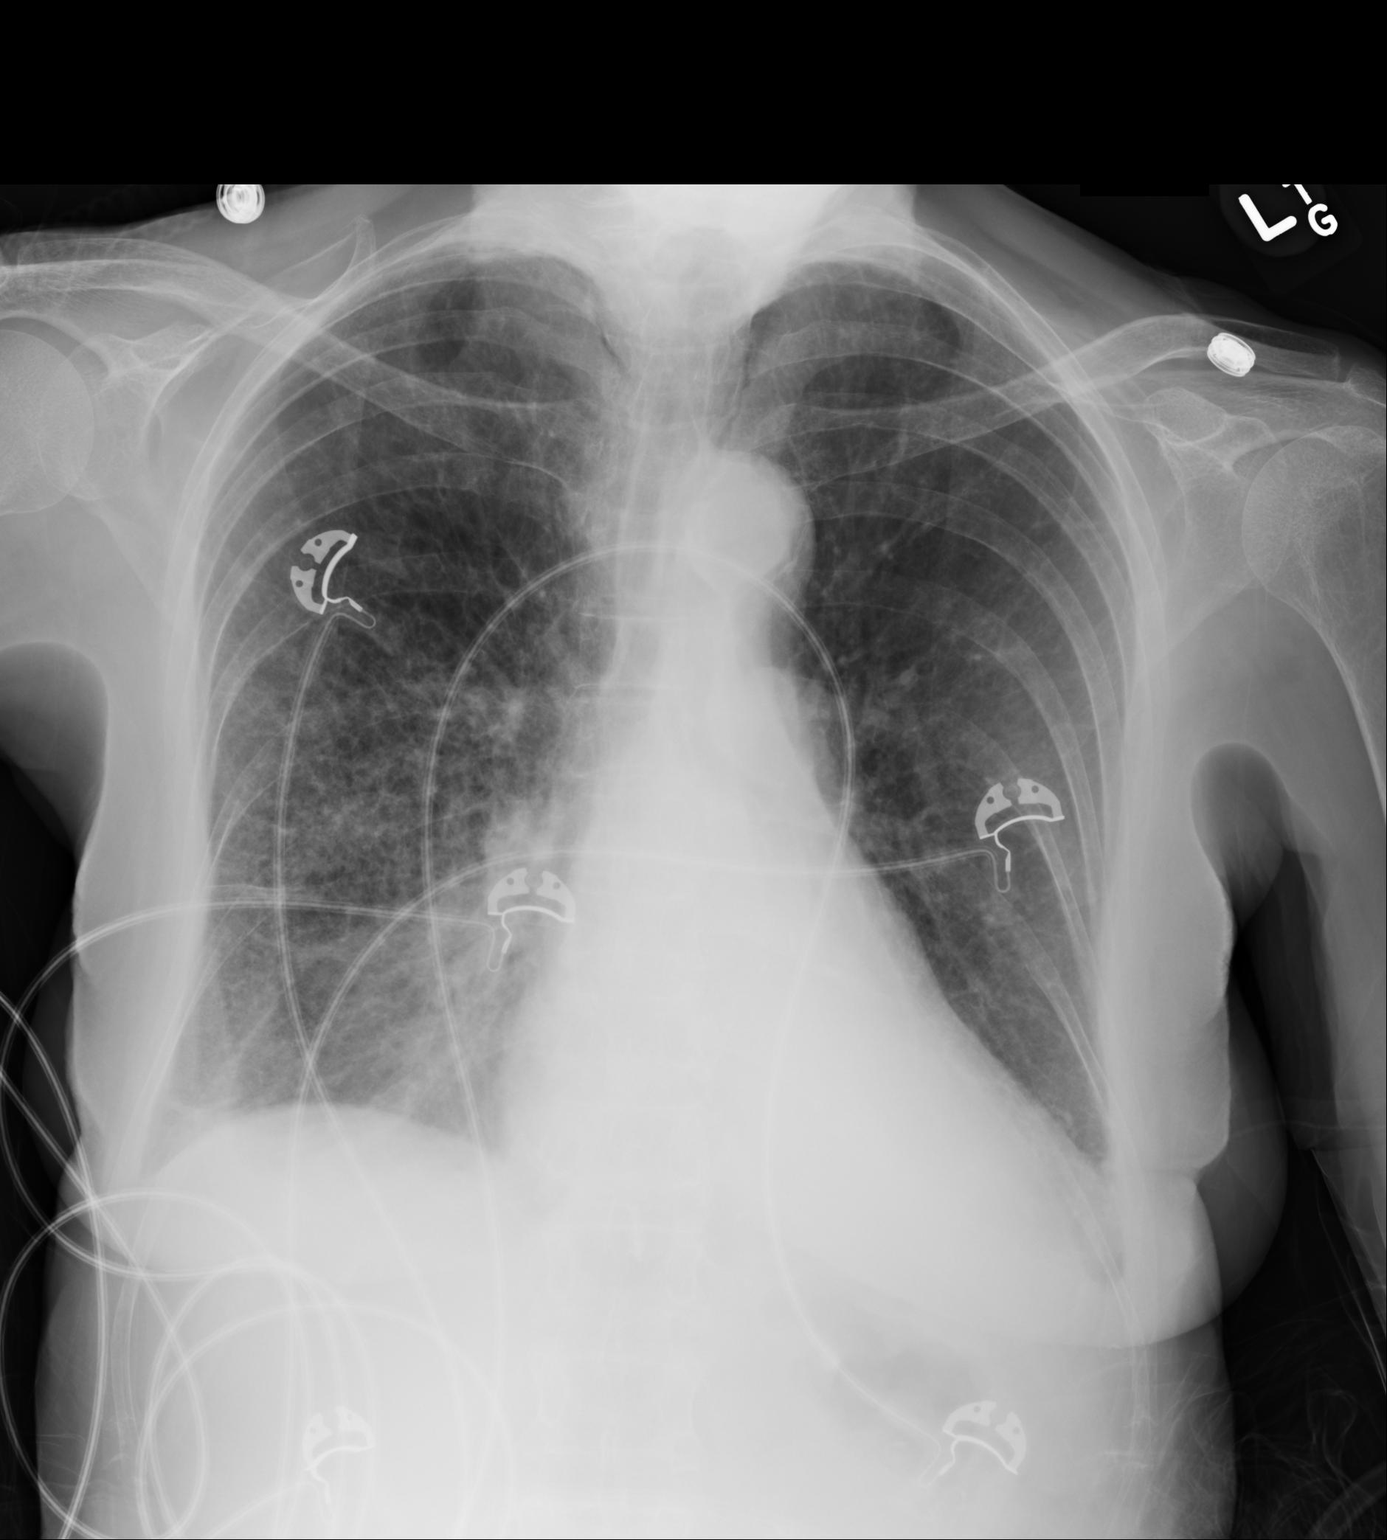

[1 of 1 positions shown; findings below may reference images not displayed]

FINDINGS: The heart is still enlarged. Interstitial edema is slightly
improved. Bibasilar airspace opacities persist. Small effusions are
suggested, slightly improved. The soft tissues and bony thorax are
unremarkable.
IMPRESSION: 1. Slightly improved interstitial edema and bilateral effusions.
2. Persistent bibasilar airspace disease concerning for pneumonia.
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
## 2016-06-23 ENCOUNTER — Inpatient Hospital Stay: Payer: Medicare Other

## 2016-06-23 ENCOUNTER — Inpatient Hospital Stay: Payer: Medicare Other | Attending: Oncology

## 2016-06-23 ENCOUNTER — Inpatient Hospital Stay (HOSPITAL_BASED_OUTPATIENT_CLINIC_OR_DEPARTMENT_OTHER): Payer: Medicare Other | Admitting: Oncology

## 2016-06-23 VITALS — BP 143/82 | HR 105 | Temp 96.5°F | Resp 18 | Wt 78.5 lb

## 2016-06-23 DIAGNOSIS — C7B02 Secondary carcinoid tumors of liver: Secondary | ICD-10-CM

## 2016-06-23 DIAGNOSIS — I10 Essential (primary) hypertension: Secondary | ICD-10-CM | POA: Insufficient documentation

## 2016-06-23 DIAGNOSIS — Z9221 Personal history of antineoplastic chemotherapy: Secondary | ICD-10-CM

## 2016-06-23 DIAGNOSIS — D3A098 Benign carcinoid tumors of other sites: Secondary | ICD-10-CM

## 2016-06-23 DIAGNOSIS — C7A029 Malignant carcinoid tumor of the large intestine, unspecified portion: Secondary | ICD-10-CM | POA: Diagnosis present

## 2016-06-23 DIAGNOSIS — Z923 Personal history of irradiation: Secondary | ICD-10-CM | POA: Diagnosis not present

## 2016-06-23 DIAGNOSIS — E785 Hyperlipidemia, unspecified: Secondary | ICD-10-CM | POA: Insufficient documentation

## 2016-06-23 DIAGNOSIS — E876 Hypokalemia: Secondary | ICD-10-CM | POA: Insufficient documentation

## 2016-06-23 DIAGNOSIS — Z87891 Personal history of nicotine dependence: Secondary | ICD-10-CM | POA: Diagnosis not present

## 2016-06-23 DIAGNOSIS — I4891 Unspecified atrial fibrillation: Secondary | ICD-10-CM | POA: Insufficient documentation

## 2016-06-23 DIAGNOSIS — Z79899 Other long term (current) drug therapy: Secondary | ICD-10-CM | POA: Insufficient documentation

## 2016-06-23 DIAGNOSIS — I509 Heart failure, unspecified: Secondary | ICD-10-CM | POA: Diagnosis not present

## 2016-06-23 DIAGNOSIS — J449 Chronic obstructive pulmonary disease, unspecified: Secondary | ICD-10-CM | POA: Diagnosis not present

## 2016-06-23 DIAGNOSIS — K219 Gastro-esophageal reflux disease without esophagitis: Secondary | ICD-10-CM | POA: Diagnosis not present

## 2016-06-23 DIAGNOSIS — Z7982 Long term (current) use of aspirin: Secondary | ICD-10-CM | POA: Insufficient documentation

## 2016-06-23 LAB — CBC WITH DIFFERENTIAL/PLATELET
BASOS ABS: 0.1 10*3/uL (ref 0–0.1)
BASOS PCT: 1 %
Eosinophils Absolute: 0.1 10*3/uL (ref 0–0.7)
Eosinophils Relative: 1 %
HEMATOCRIT: 49.4 % — AB (ref 35.0–47.0)
HEMOGLOBIN: 16.4 g/dL — AB (ref 12.0–16.0)
Lymphocytes Relative: 29 %
Lymphs Abs: 2.3 10*3/uL (ref 1.0–3.6)
MCH: 27.6 pg (ref 26.0–34.0)
MCHC: 33.1 g/dL (ref 32.0–36.0)
MCV: 83.3 fL (ref 80.0–100.0)
Monocytes Absolute: 0.6 10*3/uL (ref 0.2–0.9)
Monocytes Relative: 7 %
NEUTROS ABS: 4.9 10*3/uL (ref 1.4–6.5)
NEUTROS PCT: 62 %
Platelets: 253 10*3/uL (ref 150–440)
RBC: 5.93 MIL/uL — AB (ref 3.80–5.20)
RDW: 14.3 % (ref 11.5–14.5)
WBC: 7.9 10*3/uL (ref 3.6–11.0)

## 2016-06-23 LAB — COMPREHENSIVE METABOLIC PANEL
ALBUMIN: 4.3 g/dL (ref 3.5–5.0)
ALK PHOS: 105 U/L (ref 38–126)
ALT: 20 U/L (ref 14–54)
AST: 37 U/L (ref 15–41)
Anion gap: 12 (ref 5–15)
BILIRUBIN TOTAL: 0.7 mg/dL (ref 0.3–1.2)
BUN: 12 mg/dL (ref 6–20)
CO2: 28 mmol/L (ref 22–32)
Calcium: 9.6 mg/dL (ref 8.9–10.3)
Chloride: 100 mmol/L — ABNORMAL LOW (ref 101–111)
Creatinine, Ser: 0.74 mg/dL (ref 0.44–1.00)
GFR calc Af Amer: >60 mL/min (ref >60)
GFR calc non Af Amer: >60 mL/min (ref >60)
GLUCOSE: 101 mg/dL — AB (ref 65–99)
POTASSIUM: 3.1 mmol/L — AB (ref 3.5–5.1)
Sodium: 140 mmol/L (ref 135–145)
TOTAL PROTEIN: 8.1 g/dL (ref 6.5–8.1)

## 2016-06-23 MED ORDER — OCTREOTIDE ACETATE 20 MG IM KIT
20.0000 mg | PACK | Freq: Once | INTRAMUSCULAR | Status: AC
  Administered 2016-06-23: 20 mg via INTRAMUSCULAR

## 2016-06-23 NOTE — Progress Notes (Signed)
States has been having 4-6 episodes of diarrhea daily since Thanksgiving. Has been taking imodium AD which has helped with diarrhea.

## 2016-06-25 ENCOUNTER — Other Ambulatory Visit: Payer: Self-pay | Admitting: Oncology

## 2016-06-26 LAB — CHROMOGRANIN A: Chromogranin A: 34 nmol/L — ABNORMAL HIGH (ref 0–5)

## 2016-06-29 ENCOUNTER — Other Ambulatory Visit: Payer: Self-pay | Admitting: *Deleted

## 2016-06-29 DIAGNOSIS — D3A098 Benign carcinoid tumors of other sites: Secondary | ICD-10-CM

## 2016-06-29 DIAGNOSIS — C7A029 Malignant carcinoid tumor of the large intestine, unspecified portion: Secondary | ICD-10-CM | POA: Diagnosis not present

## 2016-07-04 LAB — 5 HIAA, QUANTITATIVE, URINE, 24 HOUR
5-HIAA, Ur: 52.6 mg/L
5-HIAA,Quant.,24 Hr Urine: 36.8 mg/24 hr — ABNORMAL HIGH (ref 0.0–14.9)
Total Volume: 700

## 2016-07-21 ENCOUNTER — Inpatient Hospital Stay: Payer: Medicare Other | Attending: Oncology

## 2016-07-21 VITALS — BP 110/70 | HR 85 | Temp 97.6°F | Resp 20

## 2016-07-21 DIAGNOSIS — I10 Essential (primary) hypertension: Secondary | ICD-10-CM | POA: Insufficient documentation

## 2016-07-21 DIAGNOSIS — Z87891 Personal history of nicotine dependence: Secondary | ICD-10-CM | POA: Diagnosis not present

## 2016-07-21 DIAGNOSIS — E785 Hyperlipidemia, unspecified: Secondary | ICD-10-CM | POA: Diagnosis not present

## 2016-07-21 DIAGNOSIS — K219 Gastro-esophageal reflux disease without esophagitis: Secondary | ICD-10-CM | POA: Diagnosis not present

## 2016-07-21 DIAGNOSIS — J449 Chronic obstructive pulmonary disease, unspecified: Secondary | ICD-10-CM | POA: Insufficient documentation

## 2016-07-21 DIAGNOSIS — Z923 Personal history of irradiation: Secondary | ICD-10-CM | POA: Diagnosis not present

## 2016-07-21 DIAGNOSIS — C7B02 Secondary carcinoid tumors of liver: Secondary | ICD-10-CM | POA: Diagnosis not present

## 2016-07-21 DIAGNOSIS — Z7982 Long term (current) use of aspirin: Secondary | ICD-10-CM | POA: Insufficient documentation

## 2016-07-21 DIAGNOSIS — E876 Hypokalemia: Secondary | ICD-10-CM | POA: Insufficient documentation

## 2016-07-21 DIAGNOSIS — I509 Heart failure, unspecified: Secondary | ICD-10-CM | POA: Diagnosis not present

## 2016-07-21 DIAGNOSIS — Z9221 Personal history of antineoplastic chemotherapy: Secondary | ICD-10-CM | POA: Diagnosis not present

## 2016-07-21 DIAGNOSIS — C7A029 Malignant carcinoid tumor of the large intestine, unspecified portion: Secondary | ICD-10-CM | POA: Diagnosis not present

## 2016-07-21 DIAGNOSIS — I4891 Unspecified atrial fibrillation: Secondary | ICD-10-CM | POA: Insufficient documentation

## 2016-07-21 DIAGNOSIS — Z79899 Other long term (current) drug therapy: Secondary | ICD-10-CM | POA: Insufficient documentation

## 2016-07-21 DIAGNOSIS — D3A098 Benign carcinoid tumors of other sites: Secondary | ICD-10-CM

## 2016-07-21 MED ORDER — LANREOTIDE ACETATE 120 MG/0.5ML ~~LOC~~ SOLN
120.0000 mg | Freq: Once | SUBCUTANEOUS | Status: AC
  Administered 2016-07-21: 120 mg via SUBCUTANEOUS
  Filled 2016-07-21: qty 120

## 2016-08-18 ENCOUNTER — Inpatient Hospital Stay: Payer: Medicare Other | Attending: Oncology

## 2016-09-13 ENCOUNTER — Ambulatory Visit: Payer: Medicare Other

## 2016-09-15 ENCOUNTER — Ambulatory Visit: Payer: Medicare Other | Admitting: Oncology

## 2016-09-15 ENCOUNTER — Other Ambulatory Visit: Payer: Medicare Other

## 2016-09-15 ENCOUNTER — Inpatient Hospital Stay: Payer: Medicare Other | Attending: Oncology

## 2016-09-15 ENCOUNTER — Ambulatory Visit: Payer: Medicare Other

## 2016-09-15 VITALS — BP 121/72 | HR 79 | Temp 96.8°F | Resp 20

## 2016-09-15 DIAGNOSIS — Z87891 Personal history of nicotine dependence: Secondary | ICD-10-CM | POA: Insufficient documentation

## 2016-09-15 DIAGNOSIS — Z79899 Other long term (current) drug therapy: Secondary | ICD-10-CM | POA: Insufficient documentation

## 2016-09-15 DIAGNOSIS — K219 Gastro-esophageal reflux disease without esophagitis: Secondary | ICD-10-CM | POA: Diagnosis not present

## 2016-09-15 DIAGNOSIS — I10 Essential (primary) hypertension: Secondary | ICD-10-CM | POA: Insufficient documentation

## 2016-09-15 DIAGNOSIS — E876 Hypokalemia: Secondary | ICD-10-CM | POA: Insufficient documentation

## 2016-09-15 DIAGNOSIS — D3A098 Benign carcinoid tumors of other sites: Secondary | ICD-10-CM

## 2016-09-15 DIAGNOSIS — Z9221 Personal history of antineoplastic chemotherapy: Secondary | ICD-10-CM | POA: Insufficient documentation

## 2016-09-15 DIAGNOSIS — C7B02 Secondary carcinoid tumors of liver: Secondary | ICD-10-CM | POA: Insufficient documentation

## 2016-09-15 DIAGNOSIS — I4891 Unspecified atrial fibrillation: Secondary | ICD-10-CM | POA: Insufficient documentation

## 2016-09-15 DIAGNOSIS — E785 Hyperlipidemia, unspecified: Secondary | ICD-10-CM | POA: Diagnosis not present

## 2016-09-15 DIAGNOSIS — Z923 Personal history of irradiation: Secondary | ICD-10-CM | POA: Diagnosis not present

## 2016-09-15 DIAGNOSIS — J449 Chronic obstructive pulmonary disease, unspecified: Secondary | ICD-10-CM | POA: Diagnosis not present

## 2016-09-15 DIAGNOSIS — C7A029 Malignant carcinoid tumor of the large intestine, unspecified portion: Secondary | ICD-10-CM | POA: Diagnosis not present

## 2016-09-15 DIAGNOSIS — Z7982 Long term (current) use of aspirin: Secondary | ICD-10-CM | POA: Diagnosis not present

## 2016-09-15 DIAGNOSIS — I509 Heart failure, unspecified: Secondary | ICD-10-CM | POA: Diagnosis not present

## 2016-09-15 MED ORDER — LANREOTIDE ACETATE 120 MG/0.5ML ~~LOC~~ SOLN
120.0000 mg | Freq: Once | SUBCUTANEOUS | Status: AC
  Administered 2016-09-15: 120 mg via SUBCUTANEOUS
  Filled 2016-09-15: qty 120

## 2016-09-15 NOTE — Patient Instructions (Signed)
Lanreotide injection What is this medicine? LANREOTIDE (lan REE oh tide) is used to reduce blood levels of growth hormone in patients with a condition called acromegaly. It also works to slow or stop tumor growth in patients with neuroendocrine tumors and treat carcinoid syndrome. This medicine may be used for other purposes; ask your health care provider or pharmacist if you have questions. COMMON BRAND NAME(S): Somatuline Depot What should I tell my health care provider before I take this medicine? They need to know if you have any of these conditions: -diabetes -gallbladder disease -heart disease -kidney disease -liver disease -thyroid disease -an unusual or allergic reaction to lanreotide, other medicines, foods, dyes, or preservatives -pregnant or trying to get pregnant -breast-feeding How should I use this medicine? This medicine is for injection under the skin. It is given by a health care professional in a hospital or clinic setting. Contact your pediatrician or health care professional regarding the use of this medicine in children. Special care may be needed. Overdosage: If you think you have taken too much of this medicine contact a poison control center or emergency room at once. NOTE: This medicine is only for you. Do not share this medicine with others. What if I miss a dose? It is important not to miss your dose. Call your doctor or health care professional if you are unable to keep an appointment. What may interact with this medicine? This medicine may interact with the following medications: -bromocriptine -cyclosporine -certain medicines for blood pressure, heart disease, irregular heart beat -certain medicines for diabetes -quinidine -terfenadine This list may not describe all possible interactions. Give your health care provider a list of all the medicines, herbs, non-prescription drugs, or dietary supplements you use. Also tell them if you smoke, drink alcohol, or  use illegal drugs. Some items may interact with your medicine. What should I watch for while using this medicine? Tell your doctor or healthcare professional if your symptoms do not start to get better or if they get worse. Visit your doctor or health care professional for regular checks on your progress. Your condition will be monitored carefully while you are receiving this medicine. You may need blood work done while you are taking this medicine. Women should inform their doctor if they wish to become pregnant or think they might be pregnant. There is a potential for serious side effects to an unborn child. Talk to your health care professional or pharmacist for more information. Do not breast-feed an infant while taking this medicine or for 6 months after stopping it. This medicine has caused ovarian failure in some women. This medicine may interfere with the ability to have a child. Talk with your doctor or health care professional if you are concerned about your fertility. What side effects may I notice from receiving this medicine? Side effects that you should report to your doctor or health care professional as soon as possible: -allergic reactions like skin rash, itching or hives, swelling of the face, lips, or tongue -increased blood pressure -severe stomach pain -signs and symptoms of high blood sugar such as dizziness; dry mouth; dry skin; fruity breath; nausea; stomach pain; increased hunger or thirst; increased urination -signs and symptoms of low blood sugar such as feeling anxious; confusion; dizziness; increased hunger; unusually weak or tired; sweating; shakiness; cold; irritable; headache; blurred vision; fast heartbeat; loss of consciousness -unusually slow heartbeat Side effects that usually do not require medical attention (report to your doctor or health care professional if they continue   or are bothersome): -constipation -diarrhea -dizziness -headache -muscle pain -muscle  spasms -nausea -pain, redness, or irritation at site where injected This list may not describe all possible side effects. Call your doctor for medical advice about side effects. You may report side effects to FDA at 1-800-FDA-1088. Where should I keep my medicine? This drug is given in a hospital or clinic and will not be stored at home. NOTE: This sheet is a summary. It may not cover all possible information. If you have questions about this medicine, talk to your doctor, pharmacist, or health care provider.  2018 Elsevier/Gold Standard (2016-03-31 10:33:47)  

## 2016-09-29 ENCOUNTER — Ambulatory Visit
Admission: RE | Admit: 2016-09-29 | Discharge: 2016-09-29 | Disposition: A | Discharge status: Home / Self Care | Payer: Medicare Other | Source: Ambulatory Visit | Attending: Oncology | Admitting: Oncology

## 2016-09-29 ENCOUNTER — Inpatient Hospital Stay: Payer: Medicare Other

## 2016-09-29 DIAGNOSIS — C7A029 Malignant carcinoid tumor of the large intestine, unspecified portion: Secondary | ICD-10-CM | POA: Diagnosis not present

## 2016-09-29 DIAGNOSIS — D3A098 Benign carcinoid tumors of other sites: Secondary | ICD-10-CM

## 2016-09-29 DIAGNOSIS — C787 Secondary malignant neoplasm of liver and intrahepatic bile duct: Secondary | ICD-10-CM | POA: Diagnosis not present

## 2016-09-29 LAB — CBC WITH DIFFERENTIAL/PLATELET
BASOS PCT: 1 %
Basophils Absolute: 0.1 10*3/uL (ref 0–0.1)
EOS ABS: 0.1 10*3/uL (ref 0–0.7)
Eosinophils Relative: 2 %
HCT: 45.3 % (ref 35.0–47.0)
Hemoglobin: 15.1 g/dL (ref 12.0–16.0)
Lymphocytes Relative: 25 %
Lymphs Abs: 2.2 10*3/uL (ref 1.0–3.6)
MCH: 27.5 pg (ref 26.0–34.0)
MCHC: 33.2 g/dL (ref 32.0–36.0)
MCV: 82.8 fL (ref 80.0–100.0)
MONOS PCT: 7 %
Monocytes Absolute: 0.6 10*3/uL (ref 0.2–0.9)
NEUTROS PCT: 65 %
Neutro Abs: 5.7 10*3/uL (ref 1.4–6.5)
Platelets: 226 10*3/uL (ref 150–440)
RBC: 5.47 MIL/uL — ABNORMAL HIGH (ref 3.80–5.20)
RDW: 15.5 % — AB (ref 11.5–14.5)
WBC: 8.7 10*3/uL (ref 3.6–11.0)

## 2016-09-29 LAB — COMPREHENSIVE METABOLIC PANEL
ALBUMIN: 4.1 g/dL (ref 3.5–5.0)
ALK PHOS: 99 U/L (ref 38–126)
ALT: 16 U/L (ref 14–54)
ANION GAP: 9 (ref 5–15)
AST: 30 U/L (ref 15–41)
BUN: 14 mg/dL (ref 6–20)
CHLORIDE: 102 mmol/L (ref 101–111)
CO2: 28 mmol/L (ref 22–32)
Calcium: 9.4 mg/dL (ref 8.9–10.3)
Creatinine, Ser: 0.82 mg/dL (ref 0.44–1.00)
GFR calc Af Amer: >60 mL/min (ref >60)
GFR calc non Af Amer: >60 mL/min (ref >60)
Glucose, Bld: 129 mg/dL — ABNORMAL HIGH (ref 65–99)
POTASSIUM: 3.7 mmol/L (ref 3.5–5.1)
SODIUM: 139 mmol/L (ref 135–145)
Total Bilirubin: 0.9 mg/dL (ref 0.3–1.2)
Total Protein: 7.3 g/dL (ref 6.5–8.1)

## 2016-09-29 MED ORDER — GADOBENATE DIMEGLUMINE 529 MG/ML IV SOLN
6.0000 mL | Freq: Once | INTRAVENOUS | Status: AC | PRN
  Administered 2016-09-29: 6 mL via INTRAVENOUS

## 2016-10-02 DIAGNOSIS — C7A029 Malignant carcinoid tumor of the large intestine, unspecified portion: Secondary | ICD-10-CM | POA: Diagnosis not present

## 2016-10-02 LAB — CHROMOGRANIN A: Chromogranin A: 30 nmol/L — ABNORMAL HIGH (ref 0–5)

## 2016-10-03 ENCOUNTER — Inpatient Hospital Stay: Payer: Medicare Other

## 2016-10-03 DIAGNOSIS — D3A098 Benign carcinoid tumors of other sites: Secondary | ICD-10-CM

## 2016-10-05 LAB — 5 HIAA, QUANTITATIVE, URINE, 24 HOUR
5 HIAA UR: 45.6 mg/L
5-HIAA,Quant.,24 Hr Urine: 38.8 mg/24 hr — ABNORMAL HIGH (ref 0.0–14.9)
Total Volume: 850
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
## 2016-10-13 ENCOUNTER — Other Ambulatory Visit: Payer: Medicare Other

## 2016-10-13 ENCOUNTER — Inpatient Hospital Stay: Payer: Medicare Other | Attending: Oncology

## 2016-10-13 ENCOUNTER — Inpatient Hospital Stay (HOSPITAL_BASED_OUTPATIENT_CLINIC_OR_DEPARTMENT_OTHER): Payer: Medicare Other | Admitting: Oncology

## 2016-10-13 VITALS — BP 130/83 | HR 68 | Temp 97.4°F | Resp 18 | Wt 73.1 lb

## 2016-10-13 DIAGNOSIS — I4891 Unspecified atrial fibrillation: Secondary | ICD-10-CM | POA: Insufficient documentation

## 2016-10-13 DIAGNOSIS — Z87891 Personal history of nicotine dependence: Secondary | ICD-10-CM | POA: Insufficient documentation

## 2016-10-13 DIAGNOSIS — Z79899 Other long term (current) drug therapy: Secondary | ICD-10-CM

## 2016-10-13 DIAGNOSIS — Z923 Personal history of irradiation: Secondary | ICD-10-CM | POA: Diagnosis not present

## 2016-10-13 DIAGNOSIS — E785 Hyperlipidemia, unspecified: Secondary | ICD-10-CM | POA: Insufficient documentation

## 2016-10-13 DIAGNOSIS — K219 Gastro-esophageal reflux disease without esophagitis: Secondary | ICD-10-CM | POA: Insufficient documentation

## 2016-10-13 DIAGNOSIS — I509 Heart failure, unspecified: Secondary | ICD-10-CM | POA: Insufficient documentation

## 2016-10-13 DIAGNOSIS — C7A029 Malignant carcinoid tumor of the large intestine, unspecified portion: Secondary | ICD-10-CM | POA: Diagnosis not present

## 2016-10-13 DIAGNOSIS — Z9221 Personal history of antineoplastic chemotherapy: Secondary | ICD-10-CM

## 2016-10-13 DIAGNOSIS — Z7982 Long term (current) use of aspirin: Secondary | ICD-10-CM | POA: Insufficient documentation

## 2016-10-13 DIAGNOSIS — C7B02 Secondary carcinoid tumors of liver: Secondary | ICD-10-CM | POA: Insufficient documentation

## 2016-10-13 DIAGNOSIS — I10 Essential (primary) hypertension: Secondary | ICD-10-CM | POA: Diagnosis not present

## 2016-10-13 DIAGNOSIS — J449 Chronic obstructive pulmonary disease, unspecified: Secondary | ICD-10-CM | POA: Diagnosis not present

## 2016-10-13 DIAGNOSIS — D3A098 Benign carcinoid tumors of other sites: Secondary | ICD-10-CM

## 2016-10-13 MED ORDER — LANREOTIDE ACETATE 120 MG/0.5ML ~~LOC~~ SOLN
120.0000 mg | Freq: Once | SUBCUTANEOUS | Status: AC
  Administered 2016-10-13: 120 mg via SUBCUTANEOUS
  Filled 2016-10-13: qty 120

## 2016-10-13 NOTE — Patient Instructions (Signed)
Lanreotide injection What is this medicine? LANREOTIDE (lan REE oh tide) is used to reduce blood levels of growth hormone in patients with a condition called acromegaly. It also works to slow or stop tumor growth in patients with neuroendocrine tumors and treat carcinoid syndrome. This medicine may be used for other purposes; ask your health care provider or pharmacist if you have questions. COMMON BRAND NAME(S): Somatuline Depot What should I tell my health care provider before I take this medicine? They need to know if you have any of these conditions: -diabetes -gallbladder disease -heart disease -kidney disease -liver disease -thyroid disease -an unusual or allergic reaction to lanreotide, other medicines, foods, dyes, or preservatives -pregnant or trying to get pregnant -breast-feeding How should I use this medicine? This medicine is for injection under the skin. It is given by a health care professional in a hospital or clinic setting. Contact your pediatrician or health care professional regarding the use of this medicine in children. Special care may be needed. Overdosage: If you think you have taken too much of this medicine contact a poison control center or emergency room at once. NOTE: This medicine is only for you. Do not share this medicine with others. What if I miss a dose? It is important not to miss your dose. Call your doctor or health care professional if you are unable to keep an appointment. What may interact with this medicine? This medicine may interact with the following medications: -bromocriptine -cyclosporine -certain medicines for blood pressure, heart disease, irregular heart beat -certain medicines for diabetes -quinidine -terfenadine This list may not describe all possible interactions. Give your health care provider a list of all the medicines, herbs, non-prescription drugs, or dietary supplements you use. Also tell them if you smoke, drink alcohol, or  use illegal drugs. Some items may interact with your medicine. What should I watch for while using this medicine? Tell your doctor or healthcare professional if your symptoms do not start to get better or if they get worse. Visit your doctor or health care professional for regular checks on your progress. Your condition will be monitored carefully while you are receiving this medicine. You may need blood work done while you are taking this medicine. Women should inform their doctor if they wish to become pregnant or think they might be pregnant. There is a potential for serious side effects to an unborn child. Talk to your health care professional or pharmacist for more information. Do not breast-feed an infant while taking this medicine or for 6 months after stopping it. This medicine has caused ovarian failure in some women. This medicine may interfere with the ability to have a child. Talk with your doctor or health care professional if you are concerned about your fertility. What side effects may I notice from receiving this medicine? Side effects that you should report to your doctor or health care professional as soon as possible: -allergic reactions like skin rash, itching or hives, swelling of the face, lips, or tongue -increased blood pressure -severe stomach pain -signs and symptoms of high blood sugar such as dizziness; dry mouth; dry skin; fruity breath; nausea; stomach pain; increased hunger or thirst; increased urination -signs and symptoms of low blood sugar such as feeling anxious; confusion; dizziness; increased hunger; unusually weak or tired; sweating; shakiness; cold; irritable; headache; blurred vision; fast heartbeat; loss of consciousness -unusually slow heartbeat Side effects that usually do not require medical attention (report to your doctor or health care professional if they continue   or are bothersome): -constipation -diarrhea -dizziness -headache -muscle pain -muscle  spasms -nausea -pain, redness, or irritation at site where injected This list may not describe all possible side effects. Call your doctor for medical advice about side effects. You may report side effects to FDA at 1-800-FDA-1088. Where should I keep my medicine? This drug is given in a hospital or clinic and will not be stored at home. NOTE: This sheet is a summary. It may not cover all possible information. If you have questions about this medicine, talk to your doctor, pharmacist, or health care provider.  2018 Elsevier/Gold Standard (2016-03-31 10:33:47)  

## 2016-11-10 ENCOUNTER — Inpatient Hospital Stay: Payer: Medicare Other | Attending: Oncology

## 2016-11-10 VITALS — BP 121/75 | HR 61 | Resp 18

## 2016-11-10 DIAGNOSIS — C7A029 Malignant carcinoid tumor of the large intestine, unspecified portion: Secondary | ICD-10-CM | POA: Diagnosis present

## 2016-11-10 DIAGNOSIS — Z9221 Personal history of antineoplastic chemotherapy: Secondary | ICD-10-CM | POA: Diagnosis not present

## 2016-11-10 DIAGNOSIS — Z923 Personal history of irradiation: Secondary | ICD-10-CM | POA: Insufficient documentation

## 2016-11-10 DIAGNOSIS — E785 Hyperlipidemia, unspecified: Secondary | ICD-10-CM | POA: Insufficient documentation

## 2016-11-10 DIAGNOSIS — I509 Heart failure, unspecified: Secondary | ICD-10-CM | POA: Diagnosis not present

## 2016-11-10 DIAGNOSIS — I4891 Unspecified atrial fibrillation: Secondary | ICD-10-CM | POA: Diagnosis not present

## 2016-11-10 DIAGNOSIS — D3A098 Benign carcinoid tumors of other sites: Secondary | ICD-10-CM

## 2016-11-10 DIAGNOSIS — Z7982 Long term (current) use of aspirin: Secondary | ICD-10-CM | POA: Insufficient documentation

## 2016-11-10 DIAGNOSIS — C7B02 Secondary carcinoid tumors of liver: Secondary | ICD-10-CM | POA: Insufficient documentation

## 2016-11-10 DIAGNOSIS — K219 Gastro-esophageal reflux disease without esophagitis: Secondary | ICD-10-CM | POA: Diagnosis not present

## 2016-11-10 DIAGNOSIS — I10 Essential (primary) hypertension: Secondary | ICD-10-CM | POA: Insufficient documentation

## 2016-11-10 DIAGNOSIS — J449 Chronic obstructive pulmonary disease, unspecified: Secondary | ICD-10-CM | POA: Diagnosis not present

## 2016-11-10 DIAGNOSIS — Z79899 Other long term (current) drug therapy: Secondary | ICD-10-CM | POA: Diagnosis not present

## 2016-11-10 DIAGNOSIS — Z87891 Personal history of nicotine dependence: Secondary | ICD-10-CM | POA: Diagnosis not present

## 2016-11-10 MED ORDER — LANREOTIDE ACETATE 120 MG/0.5ML ~~LOC~~ SOLN
120.0000 mg | Freq: Once | SUBCUTANEOUS | Status: AC
  Administered 2016-11-10: 120 mg via SUBCUTANEOUS
  Filled 2016-11-10: qty 120

## 2016-11-10 NOTE — Patient Instructions (Signed)
Lanreotide injection What is this medicine? LANREOTIDE (lan REE oh tide) is used to reduce blood levels of growth hormone in patients with a condition called acromegaly. It also works to slow or stop tumor growth in patients with neuroendocrine tumors and treat carcinoid syndrome. This medicine may be used for other purposes; ask your health care provider or pharmacist if you have questions. COMMON BRAND NAME(S): Somatuline Depot What should I tell my health care provider before I take this medicine? They need to know if you have any of these conditions: -diabetes -gallbladder disease -heart disease -kidney disease -liver disease -thyroid disease -an unusual or allergic reaction to lanreotide, other medicines, foods, dyes, or preservatives -pregnant or trying to get pregnant -breast-feeding How should I use this medicine? This medicine is for injection under the skin. It is given by a health care professional in a hospital or clinic setting. Contact your pediatrician or health care professional regarding the use of this medicine in children. Special care may be needed. Overdosage: If you think you have taken too much of this medicine contact a poison control center or emergency room at once. NOTE: This medicine is only for you. Do not share this medicine with others. What if I miss a dose? It is important not to miss your dose. Call your doctor or health care professional if you are unable to keep an appointment. What may interact with this medicine? This medicine may interact with the following medications: -bromocriptine -cyclosporine -certain medicines for blood pressure, heart disease, irregular heart beat -certain medicines for diabetes -quinidine -terfenadine This list may not describe all possible interactions. Give your health care provider a list of all the medicines, herbs, non-prescription drugs, or dietary supplements you use. Also tell them if you smoke, drink alcohol, or  use illegal drugs. Some items may interact with your medicine. What should I watch for while using this medicine? Tell your doctor or healthcare professional if your symptoms do not start to get better or if they get worse. Visit your doctor or health care professional for regular checks on your progress. Your condition will be monitored carefully while you are receiving this medicine. You may need blood work done while you are taking this medicine. Women should inform their doctor if they wish to become pregnant or think they might be pregnant. There is a potential for serious side effects to an unborn child. Talk to your health care professional or pharmacist for more information. Do not breast-feed an infant while taking this medicine or for 6 months after stopping it. This medicine has caused ovarian failure in some women. This medicine may interfere with the ability to have a child. Talk with your doctor or health care professional if you are concerned about your fertility. What side effects may I notice from receiving this medicine? Side effects that you should report to your doctor or health care professional as soon as possible: -allergic reactions like skin rash, itching or hives, swelling of the face, lips, or tongue -increased blood pressure -severe stomach pain -signs and symptoms of high blood sugar such as dizziness; dry mouth; dry skin; fruity breath; nausea; stomach pain; increased hunger or thirst; increased urination -signs and symptoms of low blood sugar such as feeling anxious; confusion; dizziness; increased hunger; unusually weak or tired; sweating; shakiness; cold; irritable; headache; blurred vision; fast heartbeat; loss of consciousness -unusually slow heartbeat Side effects that usually do not require medical attention (report to your doctor or health care professional if they continue   or are bothersome): -constipation -diarrhea -dizziness -headache -muscle pain -muscle  spasms -nausea -pain, redness, or irritation at site where injected This list may not describe all possible side effects. Call your doctor for medical advice about side effects. You may report side effects to FDA at 1-800-FDA-1088. Where should I keep my medicine? This drug is given in a hospital or clinic and will not be stored at home. NOTE: This sheet is a summary. It may not cover all possible information. If you have questions about this medicine, talk to your doctor, pharmacist, or health care provider.  2018 Elsevier/Gold Standard (2016-03-31 10:33:47)  

## 2016-12-08 ENCOUNTER — Inpatient Hospital Stay: Payer: Medicare Other | Attending: Oncology

## 2016-12-08 VITALS — BP 135/90 | HR 69 | Temp 96.8°F | Resp 18

## 2016-12-08 DIAGNOSIS — D3A098 Benign carcinoid tumors of other sites: Secondary | ICD-10-CM

## 2016-12-08 DIAGNOSIS — C7B02 Secondary carcinoid tumors of liver: Secondary | ICD-10-CM | POA: Diagnosis not present

## 2016-12-08 DIAGNOSIS — E785 Hyperlipidemia, unspecified: Secondary | ICD-10-CM | POA: Insufficient documentation

## 2016-12-08 DIAGNOSIS — I10 Essential (primary) hypertension: Secondary | ICD-10-CM | POA: Diagnosis not present

## 2016-12-08 DIAGNOSIS — C7A029 Malignant carcinoid tumor of the large intestine, unspecified portion: Secondary | ICD-10-CM | POA: Diagnosis not present

## 2016-12-08 DIAGNOSIS — Z9221 Personal history of antineoplastic chemotherapy: Secondary | ICD-10-CM | POA: Diagnosis not present

## 2016-12-08 DIAGNOSIS — J449 Chronic obstructive pulmonary disease, unspecified: Secondary | ICD-10-CM | POA: Insufficient documentation

## 2016-12-08 DIAGNOSIS — I4891 Unspecified atrial fibrillation: Secondary | ICD-10-CM | POA: Diagnosis not present

## 2016-12-08 DIAGNOSIS — Z7982 Long term (current) use of aspirin: Secondary | ICD-10-CM | POA: Insufficient documentation

## 2016-12-08 DIAGNOSIS — I509 Heart failure, unspecified: Secondary | ICD-10-CM | POA: Diagnosis not present

## 2016-12-08 DIAGNOSIS — Z79899 Other long term (current) drug therapy: Secondary | ICD-10-CM | POA: Diagnosis not present

## 2016-12-08 DIAGNOSIS — Z87891 Personal history of nicotine dependence: Secondary | ICD-10-CM | POA: Insufficient documentation

## 2016-12-08 DIAGNOSIS — K219 Gastro-esophageal reflux disease without esophagitis: Secondary | ICD-10-CM | POA: Insufficient documentation

## 2016-12-08 DIAGNOSIS — Z923 Personal history of irradiation: Secondary | ICD-10-CM | POA: Insufficient documentation

## 2016-12-08 MED ORDER — LANREOTIDE ACETATE 120 MG/0.5ML ~~LOC~~ SOLN
120.0000 mg | Freq: Once | SUBCUTANEOUS | Status: AC
  Administered 2016-12-08: 120 mg via SUBCUTANEOUS
  Filled 2016-12-08: qty 120

## 2016-12-29 ENCOUNTER — Inpatient Hospital Stay: Payer: Medicare Other

## 2016-12-29 DIAGNOSIS — C7A029 Malignant carcinoid tumor of the large intestine, unspecified portion: Secondary | ICD-10-CM | POA: Diagnosis not present

## 2016-12-29 DIAGNOSIS — D3A098 Benign carcinoid tumors of other sites: Secondary | ICD-10-CM

## 2016-12-29 LAB — COMPREHENSIVE METABOLIC PANEL
ALK PHOS: 92 U/L (ref 38–126)
ALT: 16 U/L (ref 14–54)
AST: 30 U/L (ref 15–41)
Albumin: 4.1 g/dL (ref 3.5–5.0)
Anion gap: 11 (ref 5–15)
BILIRUBIN TOTAL: 0.9 mg/dL (ref 0.3–1.2)
BUN: 17 mg/dL (ref 6–20)
CALCIUM: 9.8 mg/dL (ref 8.9–10.3)
CO2: 28 mmol/L (ref 22–32)
CREATININE: 0.81 mg/dL (ref 0.44–1.00)
Chloride: 101 mmol/L (ref 101–111)
GFR calc Af Amer: >60 mL/min (ref >60)
Glucose, Bld: 133 mg/dL — ABNORMAL HIGH (ref 65–99)
Potassium: 4 mmol/L (ref 3.5–5.1)
Sodium: 140 mmol/L (ref 135–145)
TOTAL PROTEIN: 7.5 g/dL (ref 6.5–8.1)

## 2016-12-29 LAB — CBC WITH DIFFERENTIAL/PLATELET
BASOS PCT: 1 %
Basophils Absolute: 0.1 10*3/uL (ref 0–0.1)
EOS ABS: 0.2 10*3/uL (ref 0–0.7)
EOS PCT: 2 %
HCT: 47 % (ref 35.0–47.0)
HEMOGLOBIN: 15.7 g/dL (ref 12.0–16.0)
Lymphocytes Relative: 25 %
Lymphs Abs: 2.3 10*3/uL (ref 1.0–3.6)
MCH: 27.7 pg (ref 26.0–34.0)
MCHC: 33.4 g/dL (ref 32.0–36.0)
MCV: 82.8 fL (ref 80.0–100.0)
Monocytes Absolute: 0.7 10*3/uL (ref 0.2–0.9)
Monocytes Relative: 8 %
Neutro Abs: 5.8 10*3/uL (ref 1.4–6.5)
Neutrophils Relative %: 64 %
PLATELETS: 229 10*3/uL (ref 150–440)
RBC: 5.68 MIL/uL — AB (ref 3.80–5.20)
RDW: 15.2 % — ABNORMAL HIGH (ref 11.5–14.5)
WBC: 9 10*3/uL (ref 3.6–11.0)

## 2017-01-04 ENCOUNTER — Other Ambulatory Visit: Payer: Self-pay

## 2017-01-04 ENCOUNTER — Inpatient Hospital Stay: Payer: Medicare Other

## 2017-01-04 DIAGNOSIS — C7A029 Malignant carcinoid tumor of the large intestine, unspecified portion: Secondary | ICD-10-CM | POA: Diagnosis not present

## 2017-01-04 DIAGNOSIS — D3A098 Benign carcinoid tumors of other sites: Secondary | ICD-10-CM
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
## 2017-01-05 ENCOUNTER — Inpatient Hospital Stay (HOSPITAL_BASED_OUTPATIENT_CLINIC_OR_DEPARTMENT_OTHER): Payer: Medicare Other | Admitting: Oncology

## 2017-01-05 ENCOUNTER — Inpatient Hospital Stay: Payer: Medicare Other

## 2017-01-05 VITALS — BP 108/62 | HR 73 | Temp 96.0°F | Resp 20 | Wt <= 1120 oz

## 2017-01-05 DIAGNOSIS — C7A029 Malignant carcinoid tumor of the large intestine, unspecified portion: Secondary | ICD-10-CM | POA: Diagnosis not present

## 2017-01-05 DIAGNOSIS — Z9221 Personal history of antineoplastic chemotherapy: Secondary | ICD-10-CM | POA: Diagnosis not present

## 2017-01-05 DIAGNOSIS — D3A098 Benign carcinoid tumors of other sites: Secondary | ICD-10-CM

## 2017-01-05 DIAGNOSIS — K219 Gastro-esophageal reflux disease without esophagitis: Secondary | ICD-10-CM | POA: Diagnosis not present

## 2017-01-05 DIAGNOSIS — Z923 Personal history of irradiation: Secondary | ICD-10-CM | POA: Diagnosis not present

## 2017-01-05 DIAGNOSIS — C7B02 Secondary carcinoid tumors of liver: Secondary | ICD-10-CM

## 2017-01-05 DIAGNOSIS — Z79899 Other long term (current) drug therapy: Secondary | ICD-10-CM | POA: Diagnosis not present

## 2017-01-05 MED ORDER — LANREOTIDE ACETATE 120 MG/0.5ML ~~LOC~~ SOLN
120.0000 mg | Freq: Once | SUBCUTANEOUS | Status: AC
  Administered 2017-01-05: 120 mg via SUBCUTANEOUS
  Filled 2017-01-05: qty 120

## 2017-01-05 NOTE — Progress Notes (Signed)
Patient denies any concerns today.  

## 2017-01-05 NOTE — Patient Instructions (Signed)
Lanreotide injection What is this medicine? LANREOTIDE (lan REE oh tide) is used to reduce blood levels of growth hormone in patients with a condition called acromegaly. It also works to slow or stop tumor growth in patients with neuroendocrine tumors and treat carcinoid syndrome. This medicine may be used for other purposes; ask your health care provider or pharmacist if you have questions. COMMON BRAND NAME(S): Somatuline Depot What should I tell my health care provider before I take this medicine? They need to know if you have any of these conditions: -diabetes -gallbladder disease -heart disease -kidney disease -liver disease -thyroid disease -an unusual or allergic reaction to lanreotide, other medicines, foods, dyes, or preservatives -pregnant or trying to get pregnant -breast-feeding How should I use this medicine? This medicine is for injection under the skin. It is given by a health care professional in a hospital or clinic setting. Contact your pediatrician or health care professional regarding the use of this medicine in children. Special care may be needed. Overdosage: If you think you have taken too much of this medicine contact a poison control center or emergency room at once. NOTE: This medicine is only for you. Do not share this medicine with others. What if I miss a dose? It is important not to miss your dose. Call your doctor or health care professional if you are unable to keep an appointment. What may interact with this medicine? This medicine may interact with the following medications: -bromocriptine -cyclosporine -certain medicines for blood pressure, heart disease, irregular heart beat -certain medicines for diabetes -quinidine -terfenadine This list may not describe all possible interactions. Give your health care provider a list of all the medicines, herbs, non-prescription drugs, or dietary supplements you use. Also tell them if you smoke, drink alcohol, or  use illegal drugs. Some items may interact with your medicine. What should I watch for while using this medicine? Tell your doctor or healthcare professional if your symptoms do not start to get better or if they get worse. Visit your doctor or health care professional for regular checks on your progress. Your condition will be monitored carefully while you are receiving this medicine. You may need blood work done while you are taking this medicine. Women should inform their doctor if they wish to become pregnant or think they might be pregnant. There is a potential for serious side effects to an unborn child. Talk to your health care professional or pharmacist for more information. Do not breast-feed an infant while taking this medicine or for 6 months after stopping it. This medicine has caused ovarian failure in some women. This medicine may interfere with the ability to have a child. Talk with your doctor or health care professional if you are concerned about your fertility. What side effects may I notice from receiving this medicine? Side effects that you should report to your doctor or health care professional as soon as possible: -allergic reactions like skin rash, itching or hives, swelling of the face, lips, or tongue -increased blood pressure -severe stomach pain -signs and symptoms of high blood sugar such as dizziness; dry mouth; dry skin; fruity breath; nausea; stomach pain; increased hunger or thirst; increased urination -signs and symptoms of low blood sugar such as feeling anxious; confusion; dizziness; increased hunger; unusually weak or tired; sweating; shakiness; cold; irritable; headache; blurred vision; fast heartbeat; loss of consciousness -unusually slow heartbeat Side effects that usually do not require medical attention (report to your doctor or health care professional if they continue   or are bothersome): -constipation -diarrhea -dizziness -headache -muscle pain -muscle  spasms -nausea -pain, redness, or irritation at site where injected This list may not describe all possible side effects. Call your doctor for medical advice about side effects. You may report side effects to FDA at 1-800-FDA-1088. Where should I keep my medicine? This drug is given in a hospital or clinic and will not be stored at home. NOTE: This sheet is a summary. It may not cover all possible information. If you have questions about this medicine, talk to your doctor, pharmacist, or health care provider.  2018 Elsevier/Gold Standard (2016-03-31 10:33:47)  

## 2017-01-08 LAB — 5 HIAA, QUANTITATIVE, URINE, 24 HOUR
5-HIAA, Ur: 52.2 mg/L
5-HIAA,QUANT.,24 HR URINE: 20.9 mg/(24.h) — AB (ref 0.0–14.9)
Total Volume: 400
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
## 2017-02-02 ENCOUNTER — Inpatient Hospital Stay: Payer: Medicare Other | Attending: Oncology | Admitting: Oncology

## 2017-02-02 ENCOUNTER — Inpatient Hospital Stay: Payer: Medicare Other

## 2017-02-02 VITALS — BP 143/83 | HR 58 | Temp 97.0°F | Resp 18 | Wt <= 1120 oz

## 2017-02-02 DIAGNOSIS — Z7982 Long term (current) use of aspirin: Secondary | ICD-10-CM | POA: Insufficient documentation

## 2017-02-02 DIAGNOSIS — J449 Chronic obstructive pulmonary disease, unspecified: Secondary | ICD-10-CM | POA: Insufficient documentation

## 2017-02-02 DIAGNOSIS — Z79899 Other long term (current) drug therapy: Secondary | ICD-10-CM

## 2017-02-02 DIAGNOSIS — R197 Diarrhea, unspecified: Secondary | ICD-10-CM | POA: Insufficient documentation

## 2017-02-02 DIAGNOSIS — C7A029 Malignant carcinoid tumor of the large intestine, unspecified portion: Secondary | ICD-10-CM | POA: Diagnosis present

## 2017-02-02 DIAGNOSIS — K219 Gastro-esophageal reflux disease without esophagitis: Secondary | ICD-10-CM | POA: Diagnosis not present

## 2017-02-02 DIAGNOSIS — H3530 Unspecified macular degeneration: Secondary | ICD-10-CM | POA: Insufficient documentation

## 2017-02-02 DIAGNOSIS — I4891 Unspecified atrial fibrillation: Secondary | ICD-10-CM | POA: Diagnosis not present

## 2017-02-02 DIAGNOSIS — Z923 Personal history of irradiation: Secondary | ICD-10-CM | POA: Diagnosis not present

## 2017-02-02 DIAGNOSIS — Z87891 Personal history of nicotine dependence: Secondary | ICD-10-CM | POA: Diagnosis not present

## 2017-02-02 DIAGNOSIS — R634 Abnormal weight loss: Secondary | ICD-10-CM | POA: Insufficient documentation

## 2017-02-02 DIAGNOSIS — I10 Essential (primary) hypertension: Secondary | ICD-10-CM | POA: Diagnosis not present

## 2017-02-02 DIAGNOSIS — E785 Hyperlipidemia, unspecified: Secondary | ICD-10-CM | POA: Diagnosis not present

## 2017-02-02 DIAGNOSIS — C7B02 Secondary carcinoid tumors of liver: Secondary | ICD-10-CM | POA: Insufficient documentation

## 2017-02-02 DIAGNOSIS — I509 Heart failure, unspecified: Secondary | ICD-10-CM | POA: Insufficient documentation

## 2017-02-02 DIAGNOSIS — D3A098 Benign carcinoid tumors of other sites: Secondary | ICD-10-CM

## 2017-02-02 LAB — CBC WITH DIFFERENTIAL/PLATELET
BASOS ABS: 0.1 10*3/uL (ref 0–0.1)
Basophils Relative: 1 %
Eosinophils Absolute: 0.1 10*3/uL (ref 0–0.7)
Eosinophils Relative: 1 %
HEMATOCRIT: 45.6 % (ref 35.0–47.0)
Hemoglobin: 15.2 g/dL (ref 12.0–16.0)
LYMPHS PCT: 26 %
Lymphs Abs: 2 10*3/uL (ref 1.0–3.6)
MCH: 27.8 pg (ref 26.0–34.0)
MCHC: 33.5 g/dL (ref 32.0–36.0)
MCV: 83.1 fL (ref 80.0–100.0)
MONO ABS: 0.5 10*3/uL (ref 0.2–0.9)
Monocytes Relative: 7 %
NEUTROS ABS: 5.2 10*3/uL (ref 1.4–6.5)
Neutrophils Relative %: 65 %
Platelets: 242 10*3/uL (ref 150–440)
RBC: 5.48 MIL/uL — ABNORMAL HIGH (ref 3.80–5.20)
RDW: 15.4 % — ABNORMAL HIGH (ref 11.5–14.5)
WBC: 7.9 10*3/uL (ref 3.6–11.0)

## 2017-02-02 LAB — COMPREHENSIVE METABOLIC PANEL
ALT: 16 U/L (ref 14–54)
AST: 27 U/L (ref 15–41)
Albumin: 3.9 g/dL (ref 3.5–5.0)
Alkaline Phosphatase: 92 U/L (ref 38–126)
Anion gap: 7 (ref 5–15)
BUN: 13 mg/dL (ref 6–20)
CO2: 29 mmol/L (ref 22–32)
CREATININE: 0.79 mg/dL (ref 0.44–1.00)
Calcium: 9.3 mg/dL (ref 8.9–10.3)
Chloride: 103 mmol/L (ref 101–111)
GFR calc Af Amer: >60 mL/min (ref >60)
Glucose, Bld: 123 mg/dL — ABNORMAL HIGH (ref 65–99)
Potassium: 4 mmol/L (ref 3.5–5.1)
Sodium: 139 mmol/L (ref 135–145)
Total Bilirubin: 0.8 mg/dL (ref 0.3–1.2)
Total Protein: 7.3 g/dL (ref 6.5–8.1)

## 2017-02-02 MED ORDER — LANREOTIDE ACETATE 120 MG/0.5ML ~~LOC~~ SOLN
120.0000 mg | Freq: Once | SUBCUTANEOUS | Status: AC
  Administered 2017-02-02: 120 mg via SUBCUTANEOUS
  Filled 2017-02-02: qty 120

## 2017-02-05 ENCOUNTER — Other Ambulatory Visit: Payer: Self-pay

## 2017-02-05 ENCOUNTER — Other Ambulatory Visit: Payer: Self-pay | Admitting: *Deleted

## 2017-02-05 DIAGNOSIS — D3A098 Benign carcinoid tumors of other sites: Secondary | ICD-10-CM

## 2017-02-05 DIAGNOSIS — R634 Abnormal weight loss: Secondary | ICD-10-CM

## 2017-02-15 ENCOUNTER — Inpatient Hospital Stay: Payer: Medicare Other | Attending: Oncology

## 2017-02-15 NOTE — Progress Notes (Signed)
Nutrition  Patient was a no show for nutrition appointment today.    Daisha Filosa B. Philis Doke, RD, LDN Registered Dietitian [phone] (pager)   

## 2017-03-02 ENCOUNTER — Inpatient Hospital Stay: Payer: Medicare Other

## 2017-03-02 ENCOUNTER — Inpatient Hospital Stay: Payer: Medicare Other | Attending: Oncology

## 2017-03-02 VITALS — BP 130/87 | HR 62 | Resp 18

## 2017-03-02 DIAGNOSIS — R197 Diarrhea, unspecified: Secondary | ICD-10-CM | POA: Insufficient documentation

## 2017-03-02 DIAGNOSIS — D3A098 Benign carcinoid tumors of other sites: Secondary | ICD-10-CM

## 2017-03-02 DIAGNOSIS — H3530 Unspecified macular degeneration: Secondary | ICD-10-CM | POA: Insufficient documentation

## 2017-03-02 DIAGNOSIS — I10 Essential (primary) hypertension: Secondary | ICD-10-CM | POA: Diagnosis not present

## 2017-03-02 DIAGNOSIS — C7B02 Secondary carcinoid tumors of liver: Secondary | ICD-10-CM | POA: Diagnosis not present

## 2017-03-02 DIAGNOSIS — Z923 Personal history of irradiation: Secondary | ICD-10-CM | POA: Insufficient documentation

## 2017-03-02 DIAGNOSIS — Z7982 Long term (current) use of aspirin: Secondary | ICD-10-CM | POA: Diagnosis not present

## 2017-03-02 DIAGNOSIS — C7A029 Malignant carcinoid tumor of the large intestine, unspecified portion: Secondary | ICD-10-CM | POA: Diagnosis present

## 2017-03-02 DIAGNOSIS — J449 Chronic obstructive pulmonary disease, unspecified: Secondary | ICD-10-CM | POA: Diagnosis not present

## 2017-03-02 DIAGNOSIS — Z87891 Personal history of nicotine dependence: Secondary | ICD-10-CM | POA: Diagnosis not present

## 2017-03-02 DIAGNOSIS — E785 Hyperlipidemia, unspecified: Secondary | ICD-10-CM | POA: Insufficient documentation

## 2017-03-02 DIAGNOSIS — R634 Abnormal weight loss: Secondary | ICD-10-CM | POA: Diagnosis not present

## 2017-03-02 DIAGNOSIS — K219 Gastro-esophageal reflux disease without esophagitis: Secondary | ICD-10-CM | POA: Insufficient documentation

## 2017-03-02 DIAGNOSIS — I4891 Unspecified atrial fibrillation: Secondary | ICD-10-CM | POA: Diagnosis not present

## 2017-03-02 DIAGNOSIS — I509 Heart failure, unspecified: Secondary | ICD-10-CM | POA: Diagnosis not present

## 2017-03-02 DIAGNOSIS — Z79899 Other long term (current) drug therapy: Secondary | ICD-10-CM | POA: Diagnosis not present

## 2017-03-02 LAB — CBC WITH DIFFERENTIAL/PLATELET
BASOS ABS: 0.1 10*3/uL (ref 0–0.1)
BASOS PCT: 1 %
EOS ABS: 0.1 10*3/uL (ref 0–0.7)
EOS PCT: 2 %
HCT: 46.3 % (ref 35.0–47.0)
Hemoglobin: 15.4 g/dL (ref 12.0–16.0)
LYMPHS PCT: 24 %
Lymphs Abs: 2.2 10*3/uL (ref 1.0–3.6)
MCH: 27.7 pg (ref 26.0–34.0)
MCHC: 33.3 g/dL (ref 32.0–36.0)
MCV: 83.1 fL (ref 80.0–100.0)
Monocytes Absolute: 0.6 10*3/uL (ref 0.2–0.9)
Monocytes Relative: 7 %
Neutro Abs: 6 10*3/uL (ref 1.4–6.5)
Neutrophils Relative %: 66 %
PLATELETS: 229 10*3/uL (ref 150–440)
RBC: 5.57 MIL/uL — AB (ref 3.80–5.20)
RDW: 14.9 % — ABNORMAL HIGH (ref 11.5–14.5)
WBC: 9.1 10*3/uL (ref 3.6–11.0)

## 2017-03-02 LAB — COMPREHENSIVE METABOLIC PANEL
ALT: 18 U/L (ref 14–54)
AST: 34 U/L (ref 15–41)
Albumin: 4.3 g/dL (ref 3.5–5.0)
Alkaline Phosphatase: 110 U/L (ref 38–126)
Anion gap: 9 (ref 5–15)
BILIRUBIN TOTAL: 0.7 mg/dL (ref 0.3–1.2)
BUN: 14 mg/dL (ref 6–20)
CHLORIDE: 103 mmol/L (ref 101–111)
CO2: 27 mmol/L (ref 22–32)
Calcium: 9.5 mg/dL (ref 8.9–10.3)
Creatinine, Ser: 0.81 mg/dL (ref 0.44–1.00)
GFR calc non Af Amer: >60 mL/min (ref >60)
Glucose, Bld: 131 mg/dL — ABNORMAL HIGH (ref 65–99)
POTASSIUM: 3.9 mmol/L (ref 3.5–5.1)
Sodium: 139 mmol/L (ref 135–145)
TOTAL PROTEIN: 7.5 g/dL (ref 6.5–8.1)

## 2017-03-02 MED ORDER — LANREOTIDE ACETATE 120 MG/0.5ML ~~LOC~~ SOLN
120.0000 mg | Freq: Once | SUBCUTANEOUS | Status: AC
  Administered 2017-03-02: 120 mg via SUBCUTANEOUS
  Filled 2017-03-02: qty 120

## 2017-03-02 NOTE — Patient Instructions (Signed)
Lanreotide injection What is this medicine? LANREOTIDE (lan REE oh tide) is used to reduce blood levels of growth hormone in patients with a condition called acromegaly. It also works to slow or stop tumor growth in patients with neuroendocrine tumors and treat carcinoid syndrome. This medicine may be used for other purposes; ask your health care provider or pharmacist if you have questions. COMMON BRAND NAME(S): Somatuline Depot What should I tell my health care provider before I take this medicine? They need to know if you have any of these conditions: -diabetes -gallbladder disease -heart disease -kidney disease -liver disease -thyroid disease -an unusual or allergic reaction to lanreotide, other medicines, foods, dyes, or preservatives -pregnant or trying to get pregnant -breast-feeding How should I use this medicine? This medicine is for injection under the skin. It is given by a health care professional in a hospital or clinic setting. Contact your pediatrician or health care professional regarding the use of this medicine in children. Special care may be needed. Overdosage: If you think you have taken too much of this medicine contact a poison control center or emergency room at once. NOTE: This medicine is only for you. Do not share this medicine with others. What if I miss a dose? It is important not to miss your dose. Call your doctor or health care professional if you are unable to keep an appointment. What may interact with this medicine? This medicine may interact with the following medications: -bromocriptine -cyclosporine -certain medicines for blood pressure, heart disease, irregular heart beat -certain medicines for diabetes -quinidine -terfenadine This list may not describe all possible interactions. Give your health care provider a list of all the medicines, herbs, non-prescription drugs, or dietary supplements you use. Also tell them if you smoke, drink alcohol, or  use illegal drugs. Some items may interact with your medicine. What should I watch for while using this medicine? Tell your doctor or healthcare professional if your symptoms do not start to get better or if they get worse. Visit your doctor or health care professional for regular checks on your progress. Your condition will be monitored carefully while you are receiving this medicine. You may need blood work done while you are taking this medicine. Women should inform their doctor if they wish to become pregnant or think they might be pregnant. There is a potential for serious side effects to an unborn child. Talk to your health care professional or pharmacist for more information. Do not breast-feed an infant while taking this medicine or for 6 months after stopping it. This medicine has caused ovarian failure in some women. This medicine may interfere with the ability to have a child. Talk with your doctor or health care professional if you are concerned about your fertility. What side effects may I notice from receiving this medicine? Side effects that you should report to your doctor or health care professional as soon as possible: -allergic reactions like skin rash, itching or hives, swelling of the face, lips, or tongue -increased blood pressure -severe stomach pain -signs and symptoms of high blood sugar such as dizziness; dry mouth; dry skin; fruity breath; nausea; stomach pain; increased hunger or thirst; increased urination -signs and symptoms of low blood sugar such as feeling anxious; confusion; dizziness; increased hunger; unusually weak or tired; sweating; shakiness; cold; irritable; headache; blurred vision; fast heartbeat; loss of consciousness -unusually slow heartbeat Side effects that usually do not require medical attention (report to your doctor or health care professional if they continue   or are bothersome): -constipation -diarrhea -dizziness -headache -muscle pain -muscle  spasms -nausea -pain, redness, or irritation at site where injected This list may not describe all possible side effects. Call your doctor for medical advice about side effects. You may report side effects to FDA at 1-800-FDA-1088. Where should I keep my medicine? This drug is given in a hospital or clinic and will not be stored at home. NOTE: This sheet is a summary. It may not cover all possible information. If you have questions about this medicine, talk to your doctor, pharmacist, or health care provider.  2018 Elsevier/Gold Standard (2016-03-31 10:33:47)  

## 2017-03-06 LAB — CHROMOGRANIN A: Chromogranin A: 41 nmol/L — ABNORMAL HIGH (ref 0–5)

## 2017-03-13 ENCOUNTER — Ambulatory Visit: Payer: Medicare Other

## 2017-03-30 ENCOUNTER — Inpatient Hospital Stay: Payer: Medicare Other

## 2017-03-30 ENCOUNTER — Inpatient Hospital Stay: Payer: Medicare Other | Attending: Oncology

## 2017-03-30 VITALS — BP 152/89 | HR 66 | Temp 96.7°F | Resp 18

## 2017-03-30 DIAGNOSIS — K219 Gastro-esophageal reflux disease without esophagitis: Secondary | ICD-10-CM | POA: Diagnosis not present

## 2017-03-30 DIAGNOSIS — R197 Diarrhea, unspecified: Secondary | ICD-10-CM | POA: Insufficient documentation

## 2017-03-30 DIAGNOSIS — I509 Heart failure, unspecified: Secondary | ICD-10-CM | POA: Diagnosis not present

## 2017-03-30 DIAGNOSIS — R634 Abnormal weight loss: Secondary | ICD-10-CM | POA: Diagnosis not present

## 2017-03-30 DIAGNOSIS — E785 Hyperlipidemia, unspecified: Secondary | ICD-10-CM | POA: Diagnosis not present

## 2017-03-30 DIAGNOSIS — Z87891 Personal history of nicotine dependence: Secondary | ICD-10-CM | POA: Diagnosis not present

## 2017-03-30 DIAGNOSIS — I10 Essential (primary) hypertension: Secondary | ICD-10-CM | POA: Diagnosis not present

## 2017-03-30 DIAGNOSIS — Z923 Personal history of irradiation: Secondary | ICD-10-CM | POA: Diagnosis not present

## 2017-03-30 DIAGNOSIS — Z79899 Other long term (current) drug therapy: Secondary | ICD-10-CM | POA: Insufficient documentation

## 2017-03-30 DIAGNOSIS — C7B02 Secondary carcinoid tumors of liver: Secondary | ICD-10-CM | POA: Insufficient documentation

## 2017-03-30 DIAGNOSIS — I4891 Unspecified atrial fibrillation: Secondary | ICD-10-CM | POA: Diagnosis not present

## 2017-03-30 DIAGNOSIS — Z7982 Long term (current) use of aspirin: Secondary | ICD-10-CM | POA: Diagnosis not present

## 2017-03-30 DIAGNOSIS — C7A029 Malignant carcinoid tumor of the large intestine, unspecified portion: Secondary | ICD-10-CM | POA: Insufficient documentation

## 2017-03-30 DIAGNOSIS — H3530 Unspecified macular degeneration: Secondary | ICD-10-CM | POA: Diagnosis not present

## 2017-03-30 DIAGNOSIS — J449 Chronic obstructive pulmonary disease, unspecified: Secondary | ICD-10-CM | POA: Diagnosis not present

## 2017-03-30 DIAGNOSIS — D3A098 Benign carcinoid tumors of other sites: Secondary | ICD-10-CM

## 2017-03-30 LAB — COMPREHENSIVE METABOLIC PANEL
ALBUMIN: 3.8 g/dL (ref 3.5–5.0)
ALK PHOS: 101 U/L (ref 38–126)
ALT: 17 U/L (ref 14–54)
AST: 34 U/L (ref 15–41)
Anion gap: 9 (ref 5–15)
BUN: 12 mg/dL (ref 6–20)
CHLORIDE: 102 mmol/L (ref 101–111)
CO2: 29 mmol/L (ref 22–32)
CREATININE: 0.77 mg/dL (ref 0.44–1.00)
Calcium: 8.8 mg/dL — ABNORMAL LOW (ref 8.9–10.3)
GFR calc Af Amer: >60 mL/min (ref >60)
GFR calc non Af Amer: >60 mL/min (ref >60)
Glucose, Bld: 121 mg/dL — ABNORMAL HIGH (ref 65–99)
Potassium: 3.2 mmol/L — ABNORMAL LOW (ref 3.5–5.1)
SODIUM: 140 mmol/L (ref 135–145)
Total Bilirubin: 0.7 mg/dL (ref 0.3–1.2)
Total Protein: 7.3 g/dL (ref 6.5–8.1)

## 2017-03-30 LAB — CBC WITH DIFFERENTIAL/PLATELET
Basophils Absolute: 0.1 10*3/uL (ref 0–0.1)
Basophils Relative: 1 %
EOS ABS: 0.1 10*3/uL (ref 0–0.7)
Eosinophils Relative: 1 %
HCT: 43.5 % (ref 35.0–47.0)
HEMOGLOBIN: 14.7 g/dL (ref 12.0–16.0)
LYMPHS ABS: 2 10*3/uL (ref 1.0–3.6)
Lymphocytes Relative: 21 %
MCH: 27.9 pg (ref 26.0–34.0)
MCHC: 33.8 g/dL (ref 32.0–36.0)
MCV: 82.6 fL (ref 80.0–100.0)
Monocytes Absolute: 0.8 10*3/uL (ref 0.2–0.9)
Monocytes Relative: 8 %
NEUTROS PCT: 69 %
Neutro Abs: 6.5 10*3/uL (ref 1.4–6.5)
Platelets: 227 10*3/uL (ref 150–440)
RBC: 5.26 MIL/uL — AB (ref 3.80–5.20)
RDW: 15.1 % — ABNORMAL HIGH (ref 11.5–14.5)
WBC: 9.5 10*3/uL (ref 3.6–11.0)

## 2017-03-30 MED ORDER — LANREOTIDE ACETATE 120 MG/0.5ML ~~LOC~~ SOLN
120.0000 mg | Freq: Once | SUBCUTANEOUS | Status: AC
  Administered 2017-03-30: 120 mg via SUBCUTANEOUS
  Filled 2017-03-30: qty 120

## 2017-03-30 NOTE — Patient Instructions (Signed)
Lanreotide injection What is this medicine? LANREOTIDE (lan REE oh tide) is used to reduce blood levels of growth hormone in patients with a condition called acromegaly. It also works to slow or stop tumor growth in patients with neuroendocrine tumors and treat carcinoid syndrome. This medicine may be used for other purposes; ask your health care provider or pharmacist if you have questions. COMMON BRAND NAME(S): Somatuline Depot What should I tell my health care provider before I take this medicine? They need to know if you have any of these conditions: -diabetes -gallbladder disease -heart disease -kidney disease -liver disease -thyroid disease -an unusual or allergic reaction to lanreotide, other medicines, foods, dyes, or preservatives -pregnant or trying to get pregnant -breast-feeding How should I use this medicine? This medicine is for injection under the skin. It is given by a health care professional in a hospital or clinic setting. Contact your pediatrician or health care professional regarding the use of this medicine in children. Special care may be needed. Overdosage: If you think you have taken too much of this medicine contact a poison control center or emergency room at once. NOTE: This medicine is only for you. Do not share this medicine with others. What if I miss a dose? It is important not to miss your dose. Call your doctor or health care professional if you are unable to keep an appointment. What may interact with this medicine? This medicine may interact with the following medications: -bromocriptine -cyclosporine -certain medicines for blood pressure, heart disease, irregular heart beat -certain medicines for diabetes -quinidine -terfenadine This list may not describe all possible interactions. Give your health care provider a list of all the medicines, herbs, non-prescription drugs, or dietary supplements you use. Also tell them if you smoke, drink alcohol, or  use illegal drugs. Some items may interact with your medicine. What should I watch for while using this medicine? Tell your doctor or healthcare professional if your symptoms do not start to get better or if they get worse. Visit your doctor or health care professional for regular checks on your progress. Your condition will be monitored carefully while you are receiving this medicine. You may need blood work done while you are taking this medicine. Women should inform their doctor if they wish to become pregnant or think they might be pregnant. There is a potential for serious side effects to an unborn child. Talk to your health care professional or pharmacist for more information. Do not breast-feed an infant while taking this medicine or for 6 months after stopping it. This medicine has caused ovarian failure in some women. This medicine may interfere with the ability to have a child. Talk with your doctor or health care professional if you are concerned about your fertility. What side effects may I notice from receiving this medicine? Side effects that you should report to your doctor or health care professional as soon as possible: -allergic reactions like skin rash, itching or hives, swelling of the face, lips, or tongue -increased blood pressure -severe stomach pain -signs and symptoms of high blood sugar such as dizziness; dry mouth; dry skin; fruity breath; nausea; stomach pain; increased hunger or thirst; increased urination -signs and symptoms of low blood sugar such as feeling anxious; confusion; dizziness; increased hunger; unusually weak or tired; sweating; shakiness; cold; irritable; headache; blurred vision; fast heartbeat; loss of consciousness -unusually slow heartbeat Side effects that usually do not require medical attention (report to your doctor or health care professional if they continue   or are bothersome): -constipation -diarrhea -dizziness -headache -muscle pain -muscle  spasms -nausea -pain, redness, or irritation at site where injected This list may not describe all possible side effects. Call your doctor for medical advice about side effects. You may report side effects to FDA at 1-800-FDA-1088. Where should I keep my medicine? This drug is given in a hospital or clinic and will not be stored at home. NOTE: This sheet is a summary. It may not cover all possible information. If you have questions about this medicine, talk to your doctor, pharmacist, or health care provider.  2018 Elsevier/Gold Standard (2016-03-31 10:33:47)  

## 2017-04-02 LAB — CHROMOGRANIN A: CHROMOGRANIN A: 26 nmol/L — AB (ref 0–5)

## 2017-04-11 ENCOUNTER — Other Ambulatory Visit: Payer: Self-pay

## 2017-04-11 DIAGNOSIS — C787 Secondary malignant neoplasm of liver and intrahepatic bile duct: Secondary | ICD-10-CM | POA: Diagnosis not present

## 2017-04-11 DIAGNOSIS — D3A098 Benign carcinoid tumors of other sites: Secondary | ICD-10-CM

## 2017-04-11 DIAGNOSIS — R937 Abnormal findings on diagnostic imaging of other parts of musculoskeletal system: Secondary | ICD-10-CM | POA: Diagnosis not present

## 2017-04-16 LAB — 5 HIAA, QUANTITATIVE, URINE, 24 HOUR
5 HIAA UR: 49.5 mg/L
5-HIAA,QUANT.,24 HR URINE: 44.6 mg/(24.h) — AB (ref 0.0–14.9)
Total Volume: 900
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
## 2017-04-26 ENCOUNTER — Ambulatory Visit
Admission: RE | Admit: 2017-04-26 | Discharge: 2017-04-26 | Disposition: A | Discharge status: Home / Self Care | Payer: Medicare Other | Source: Ambulatory Visit | Attending: Oncology | Admitting: Oncology

## 2017-04-26 DIAGNOSIS — D3A098 Benign carcinoid tumors of other sites: Secondary | ICD-10-CM | POA: Insufficient documentation

## 2017-04-26 DIAGNOSIS — C787 Secondary malignant neoplasm of liver and intrahepatic bile duct: Secondary | ICD-10-CM | POA: Insufficient documentation

## 2017-04-26 DIAGNOSIS — R937 Abnormal findings on diagnostic imaging of other parts of musculoskeletal system: Secondary | ICD-10-CM | POA: Insufficient documentation

## 2017-04-26 MED ORDER — GADOBENATE DIMEGLUMINE 529 MG/ML IV SOLN
10.0000 mL | Freq: Once | INTRAVENOUS | Status: AC | PRN
  Administered 2017-04-26: 6 mL via INTRAVENOUS

## 2017-04-27 ENCOUNTER — Inpatient Hospital Stay: Payer: Medicare Other | Admitting: Oncology

## 2017-04-27 ENCOUNTER — Inpatient Hospital Stay: Payer: Medicare Other
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
## 2017-05-04 ENCOUNTER — Inpatient Hospital Stay: Payer: Medicare Other

## 2017-05-04 ENCOUNTER — Inpatient Hospital Stay: Payer: Medicare Other | Attending: Oncology | Admitting: Oncology

## 2017-05-04 VITALS — BP 145/85 | HR 67 | Temp 97.9°F | Resp 18 | Wt 71.4 lb

## 2017-05-04 DIAGNOSIS — E785 Hyperlipidemia, unspecified: Secondary | ICD-10-CM | POA: Diagnosis not present

## 2017-05-04 DIAGNOSIS — Z7982 Long term (current) use of aspirin: Secondary | ICD-10-CM | POA: Diagnosis not present

## 2017-05-04 DIAGNOSIS — K219 Gastro-esophageal reflux disease without esophagitis: Secondary | ICD-10-CM | POA: Insufficient documentation

## 2017-05-04 DIAGNOSIS — D3A098 Benign carcinoid tumors of other sites: Secondary | ICD-10-CM

## 2017-05-04 DIAGNOSIS — C7A029 Malignant carcinoid tumor of the large intestine, unspecified portion: Secondary | ICD-10-CM | POA: Diagnosis present

## 2017-05-04 DIAGNOSIS — H3530 Unspecified macular degeneration: Secondary | ICD-10-CM | POA: Diagnosis not present

## 2017-05-04 DIAGNOSIS — J449 Chronic obstructive pulmonary disease, unspecified: Secondary | ICD-10-CM | POA: Insufficient documentation

## 2017-05-04 DIAGNOSIS — C7B02 Secondary carcinoid tumors of liver: Secondary | ICD-10-CM

## 2017-05-04 DIAGNOSIS — Z79899 Other long term (current) drug therapy: Secondary | ICD-10-CM

## 2017-05-04 DIAGNOSIS — I10 Essential (primary) hypertension: Secondary | ICD-10-CM | POA: Insufficient documentation

## 2017-05-04 DIAGNOSIS — Z923 Personal history of irradiation: Secondary | ICD-10-CM | POA: Insufficient documentation

## 2017-05-04 DIAGNOSIS — Z87891 Personal history of nicotine dependence: Secondary | ICD-10-CM | POA: Insufficient documentation

## 2017-05-04 DIAGNOSIS — I4891 Unspecified atrial fibrillation: Secondary | ICD-10-CM | POA: Insufficient documentation

## 2017-05-04 DIAGNOSIS — I509 Heart failure, unspecified: Secondary | ICD-10-CM | POA: Diagnosis not present

## 2017-05-04 DIAGNOSIS — E876 Hypokalemia: Secondary | ICD-10-CM | POA: Diagnosis not present

## 2017-05-04 DIAGNOSIS — R197 Diarrhea, unspecified: Secondary | ICD-10-CM | POA: Diagnosis not present

## 2017-05-04 DIAGNOSIS — R634 Abnormal weight loss: Secondary | ICD-10-CM | POA: Insufficient documentation

## 2017-05-04 LAB — COMPREHENSIVE METABOLIC PANEL
ALBUMIN: 4.2 g/dL (ref 3.5–5.0)
ALK PHOS: 104 U/L (ref 38–126)
ALT: 15 U/L (ref 14–54)
AST: 38 U/L (ref 15–41)
Anion gap: 12 (ref 5–15)
BUN: 11 mg/dL (ref 6–20)
CALCIUM: 9.1 mg/dL (ref 8.9–10.3)
CO2: 27 mmol/L (ref 22–32)
CREATININE: 0.76 mg/dL (ref 0.44–1.00)
Chloride: 101 mmol/L (ref 101–111)
GFR calc Af Amer: >60 mL/min (ref >60)
GFR calc non Af Amer: >60 mL/min (ref >60)
GLUCOSE: 87 mg/dL (ref 65–99)
Potassium: 2.7 mmol/L — CL (ref 3.5–5.1)
SODIUM: 140 mmol/L (ref 135–145)
Total Bilirubin: 0.5 mg/dL (ref 0.3–1.2)
Total Protein: 7.7 g/dL (ref 6.5–8.1)

## 2017-05-04 LAB — CBC WITH DIFFERENTIAL/PLATELET
BASOS PCT: 1 %
Basophils Absolute: 0 10*3/uL (ref 0–0.1)
Eosinophils Absolute: 0.1 10*3/uL (ref 0–0.7)
Eosinophils Relative: 1 %
HEMATOCRIT: 46.3 % (ref 35.0–47.0)
HEMOGLOBIN: 15.6 g/dL (ref 12.0–16.0)
Lymphocytes Relative: 25 %
Lymphs Abs: 1.8 10*3/uL (ref 1.0–3.6)
MCH: 27.7 pg (ref 26.0–34.0)
MCHC: 33.6 g/dL (ref 32.0–36.0)
MCV: 82.5 fL (ref 80.0–100.0)
MONOS PCT: 6 %
Monocytes Absolute: 0.4 10*3/uL (ref 0.2–0.9)
NEUTROS ABS: 4.8 10*3/uL (ref 1.4–6.5)
NEUTROS PCT: 67 %
Platelets: 206 10*3/uL (ref 150–440)
RBC: 5.61 MIL/uL — ABNORMAL HIGH (ref 3.80–5.20)
RDW: 15 % — ABNORMAL HIGH (ref 11.5–14.5)
WBC: 7.2 10*3/uL (ref 3.6–11.0)

## 2017-05-04 MED ORDER — LANREOTIDE ACETATE 120 MG/0.5ML ~~LOC~~ SOLN
120.0000 mg | Freq: Once | SUBCUTANEOUS | Status: AC
  Administered 2017-05-04: 120 mg via SUBCUTANEOUS
  Filled 2017-05-04: qty 120

## 2017-05-04 MED ORDER — POTASSIUM CHLORIDE ER 20 MEQ PO TBCR: 20.0000 meq | EXTENDED_RELEASE_TABLET | Freq: Every day | ORAL | 1 refills | Status: DC

## 2017-05-04 NOTE — Patient Instructions (Signed)
Lanreotide injection What is this medicine? LANREOTIDE (lan REE oh tide) is used to reduce blood levels of growth hormone in patients with a condition called acromegaly. It also works to slow or stop tumor growth in patients with neuroendocrine tumors and treat carcinoid syndrome. This medicine may be used for other purposes; ask your health care provider or pharmacist if you have questions. COMMON BRAND NAME(S): Somatuline Depot What should I tell my health care provider before I take this medicine? They need to know if you have any of these conditions: -diabetes -gallbladder disease -heart disease -kidney disease -liver disease -thyroid disease -an unusual or allergic reaction to lanreotide, other medicines, foods, dyes, or preservatives -pregnant or trying to get pregnant -breast-feeding How should I use this medicine? This medicine is for injection under the skin. It is given by a health care professional in a hospital or clinic setting. Contact your pediatrician or health care professional regarding the use of this medicine in children. Special care may be needed. Overdosage: If you think you have taken too much of this medicine contact a poison control center or emergency room at once. NOTE: This medicine is only for you. Do not share this medicine with others. What if I miss a dose? It is important not to miss your dose. Call your doctor or health care professional if you are unable to keep an appointment. What may interact with this medicine? This medicine may interact with the following medications: -bromocriptine -cyclosporine -certain medicines for blood pressure, heart disease, irregular heart beat -certain medicines for diabetes -quinidine -terfenadine This list may not describe all possible interactions. Give your health care provider a list of all the medicines, herbs, non-prescription drugs, or dietary supplements you use. Also tell them if you smoke, drink alcohol, or  use illegal drugs. Some items may interact with your medicine. What should I watch for while using this medicine? Tell your doctor or healthcare professional if your symptoms do not start to get better or if they get worse. Visit your doctor or health care professional for regular checks on your progress. Your condition will be monitored carefully while you are receiving this medicine. You may need blood work done while you are taking this medicine. Women should inform their doctor if they wish to become pregnant or think they might be pregnant. There is a potential for serious side effects to an unborn child. Talk to your health care professional or pharmacist for more information. Do not breast-feed an infant while taking this medicine or for 6 months after stopping it. This medicine has caused ovarian failure in some women. This medicine may interfere with the ability to have a child. Talk with your doctor or health care professional if you are concerned about your fertility. What side effects may I notice from receiving this medicine? Side effects that you should report to your doctor or health care professional as soon as possible: -allergic reactions like skin rash, itching or hives, swelling of the face, lips, or tongue -increased blood pressure -severe stomach pain -signs and symptoms of high blood sugar such as dizziness; dry mouth; dry skin; fruity breath; nausea; stomach pain; increased hunger or thirst; increased urination -signs and symptoms of low blood sugar such as feeling anxious; confusion; dizziness; increased hunger; unusually weak or tired; sweating; shakiness; cold; irritable; headache; blurred vision; fast heartbeat; loss of consciousness -unusually slow heartbeat Side effects that usually do not require medical attention (report to your doctor or health care professional if they continue   or are bothersome): -constipation -diarrhea -dizziness -headache -muscle pain -muscle  spasms -nausea -pain, redness, or irritation at site where injected This list may not describe all possible side effects. Call your doctor for medical advice about side effects. You may report side effects to FDA at 1-800-FDA-1088. Where should I keep my medicine? This drug is given in a hospital or clinic and will not be stored at home. NOTE: This sheet is a summary. It may not cover all possible information. If you have questions about this medicine, talk to your doctor, pharmacist, or health care provider.  2018 Elsevier/Gold Standard (2016-03-31 10:33:47)  

## 2017-05-08 LAB — CHROMOGRANIN A: Chromogranin A: 61 nmol/L — ABNORMAL HIGH (ref 0–5)

## 2017-05-21 ENCOUNTER — Telehealth: Payer: Self-pay

## 2017-05-21 NOTE — Telephone Encounter (Signed)
Ok, thank you

## 2017-05-21 NOTE — Telephone Encounter (Signed)
Spoke with Judeen Hammans and she was not sure what the new medication was but it was something to do with getting rid of toxins is what patient reported to daughter.   Spoke with patient she mentioned that the only new thing was potassium. I asked if she was eating and drinking before and she states probably not enough. She mentions reading instructions on how to take potassium. She will do better about this. Advised she needs to push fluids if she still has the diarrhea. She is feeling better and will call if she gets worse.

## 2017-05-21 NOTE — Telephone Encounter (Signed)
-----   Message from Lloyd Huger, MD sent at 05/21/2017  2:53 PM EST ----- Regarding: RE: PT SICK Contact: [phone] Not sure what new medication she is taking.  I switched her from octreotide to somatuline almost a year go and made zero changes when I saw her recently.   ----- Message ----- From: Secundino Ginger Sent: 05/21/2017   2:09 PM To: Lloyd Huger, MD, Johney Maine, RN Subject: PT SICK                                        PT DAUGHTER SHERRY CALLED AND PT WAS SICK SINCE Friday NIGHT, LOWER LEFT STOMACH VERY PAINFUL. SHE STILL DON'T FEEL GOOD AND IS JUST LAYING AROUND. PT TOLD HER SHE IS ON A NEW MED AND SHE NEEDED TO KEEP TAKING IT. WILL YOU CALL PT DAUGHTER.

## 2017-06-08 ENCOUNTER — Inpatient Hospital Stay: Payer: Medicare Other | Attending: Oncology

## 2017-06-08 ENCOUNTER — Inpatient Hospital Stay: Payer: Medicare Other

## 2017-06-08 VITALS — BP 124/80 | HR 76 | Temp 96.6°F | Resp 17

## 2017-06-08 DIAGNOSIS — H3530 Unspecified macular degeneration: Secondary | ICD-10-CM | POA: Insufficient documentation

## 2017-06-08 DIAGNOSIS — I4891 Unspecified atrial fibrillation: Secondary | ICD-10-CM | POA: Diagnosis not present

## 2017-06-08 DIAGNOSIS — C7B02 Secondary carcinoid tumors of liver: Secondary | ICD-10-CM | POA: Diagnosis not present

## 2017-06-08 DIAGNOSIS — Z7982 Long term (current) use of aspirin: Secondary | ICD-10-CM | POA: Diagnosis not present

## 2017-06-08 DIAGNOSIS — E785 Hyperlipidemia, unspecified: Secondary | ICD-10-CM | POA: Diagnosis not present

## 2017-06-08 DIAGNOSIS — R634 Abnormal weight loss: Secondary | ICD-10-CM | POA: Diagnosis not present

## 2017-06-08 DIAGNOSIS — J449 Chronic obstructive pulmonary disease, unspecified: Secondary | ICD-10-CM | POA: Insufficient documentation

## 2017-06-08 DIAGNOSIS — Z923 Personal history of irradiation: Secondary | ICD-10-CM | POA: Diagnosis not present

## 2017-06-08 DIAGNOSIS — I10 Essential (primary) hypertension: Secondary | ICD-10-CM | POA: Diagnosis not present

## 2017-06-08 DIAGNOSIS — D3A098 Benign carcinoid tumors of other sites: Secondary | ICD-10-CM

## 2017-06-08 DIAGNOSIS — C7A029 Malignant carcinoid tumor of the large intestine, unspecified portion: Secondary | ICD-10-CM | POA: Diagnosis not present

## 2017-06-08 DIAGNOSIS — K219 Gastro-esophageal reflux disease without esophagitis: Secondary | ICD-10-CM | POA: Diagnosis not present

## 2017-06-08 DIAGNOSIS — Z79899 Other long term (current) drug therapy: Secondary | ICD-10-CM | POA: Insufficient documentation

## 2017-06-08 DIAGNOSIS — E876 Hypokalemia: Secondary | ICD-10-CM | POA: Insufficient documentation

## 2017-06-08 DIAGNOSIS — R197 Diarrhea, unspecified: Secondary | ICD-10-CM | POA: Diagnosis not present

## 2017-06-08 DIAGNOSIS — I509 Heart failure, unspecified: Secondary | ICD-10-CM | POA: Insufficient documentation

## 2017-06-08 DIAGNOSIS — Z87891 Personal history of nicotine dependence: Secondary | ICD-10-CM | POA: Diagnosis not present

## 2017-06-08 LAB — CBC WITH DIFFERENTIAL/PLATELET
BASOS ABS: 0.1 10*3/uL (ref 0–0.1)
Basophils Relative: 1 %
EOS PCT: 2 %
Eosinophils Absolute: 0.2 10*3/uL (ref 0–0.7)
HEMATOCRIT: 42.4 % (ref 35.0–47.0)
Hemoglobin: 14.2 g/dL (ref 12.0–16.0)
LYMPHS ABS: 2.6 10*3/uL (ref 1.0–3.6)
LYMPHS PCT: 31 %
MCH: 27.5 pg (ref 26.0–34.0)
MCHC: 33.4 g/dL (ref 32.0–36.0)
MCV: 82.4 fL (ref 80.0–100.0)
MONO ABS: 0.6 10*3/uL (ref 0.2–0.9)
MONOS PCT: 7 %
NEUTROS ABS: 4.9 10*3/uL (ref 1.4–6.5)
Neutrophils Relative %: 59 %
PLATELETS: 215 10*3/uL (ref 150–440)
RBC: 5.15 MIL/uL (ref 3.80–5.20)
RDW: 15.2 % — AB (ref 11.5–14.5)
WBC: 8.3 10*3/uL (ref 3.6–11.0)

## 2017-06-08 LAB — COMPREHENSIVE METABOLIC PANEL
ALT: 17 U/L (ref 14–54)
ANION GAP: 8 (ref 5–15)
AST: 34 U/L (ref 15–41)
Albumin: 3.8 g/dL (ref 3.5–5.0)
Alkaline Phosphatase: 96 U/L (ref 38–126)
BILIRUBIN TOTAL: 0.4 mg/dL (ref 0.3–1.2)
BUN: 17 mg/dL (ref 6–20)
CHLORIDE: 102 mmol/L (ref 101–111)
CO2: 27 mmol/L (ref 22–32)
Calcium: 8.7 mg/dL — ABNORMAL LOW (ref 8.9–10.3)
Creatinine, Ser: 0.78 mg/dL (ref 0.44–1.00)
Glucose, Bld: 110 mg/dL — ABNORMAL HIGH (ref 65–99)
POTASSIUM: 3.1 mmol/L — AB (ref 3.5–5.1)
Sodium: 137 mmol/L (ref 135–145)
Total Protein: 6.9 g/dL (ref 6.5–8.1)

## 2017-06-08 MED ORDER — LANREOTIDE ACETATE 120 MG/0.5ML ~~LOC~~ SOLN
120.0000 mg | Freq: Once | SUBCUTANEOUS | Status: AC
  Administered 2017-06-08: 120 mg via SUBCUTANEOUS
  Filled 2017-06-08: qty 120

## 2017-06-11 LAB — CHROMOGRANIN A: CHROMOGRANIN A: 8 nmol/L — AB (ref 0–5)

## 2017-06-22 ENCOUNTER — Inpatient Hospital Stay: Payer: Medicare Other | Attending: Oncology

## 2017-06-22 DIAGNOSIS — C7B02 Secondary carcinoid tumors of liver: Secondary | ICD-10-CM | POA: Diagnosis not present

## 2017-06-22 DIAGNOSIS — Z7982 Long term (current) use of aspirin: Secondary | ICD-10-CM | POA: Diagnosis not present

## 2017-06-22 DIAGNOSIS — Z923 Personal history of irradiation: Secondary | ICD-10-CM | POA: Diagnosis not present

## 2017-06-22 DIAGNOSIS — I509 Heart failure, unspecified: Secondary | ICD-10-CM | POA: Insufficient documentation

## 2017-06-22 DIAGNOSIS — Z79899 Other long term (current) drug therapy: Secondary | ICD-10-CM | POA: Insufficient documentation

## 2017-06-22 DIAGNOSIS — K219 Gastro-esophageal reflux disease without esophagitis: Secondary | ICD-10-CM | POA: Insufficient documentation

## 2017-06-22 DIAGNOSIS — R634 Abnormal weight loss: Secondary | ICD-10-CM | POA: Diagnosis not present

## 2017-06-22 DIAGNOSIS — H3530 Unspecified macular degeneration: Secondary | ICD-10-CM | POA: Diagnosis not present

## 2017-06-22 DIAGNOSIS — I4891 Unspecified atrial fibrillation: Secondary | ICD-10-CM | POA: Insufficient documentation

## 2017-06-22 DIAGNOSIS — E785 Hyperlipidemia, unspecified: Secondary | ICD-10-CM | POA: Diagnosis not present

## 2017-06-22 DIAGNOSIS — I10 Essential (primary) hypertension: Secondary | ICD-10-CM | POA: Insufficient documentation

## 2017-06-22 DIAGNOSIS — J449 Chronic obstructive pulmonary disease, unspecified: Secondary | ICD-10-CM | POA: Insufficient documentation

## 2017-06-22 DIAGNOSIS — C7A029 Malignant carcinoid tumor of the large intestine, unspecified portion: Secondary | ICD-10-CM | POA: Insufficient documentation

## 2017-06-22 DIAGNOSIS — R197 Diarrhea, unspecified: Secondary | ICD-10-CM | POA: Diagnosis not present

## 2017-06-22 DIAGNOSIS — D3A098 Benign carcinoid tumors of other sites: Secondary | ICD-10-CM

## 2017-06-22 DIAGNOSIS — Z87891 Personal history of nicotine dependence: Secondary | ICD-10-CM | POA: Diagnosis not present

## 2017-06-22 DIAGNOSIS — E876 Hypokalemia: Secondary | ICD-10-CM | POA: Insufficient documentation

## 2017-06-27 LAB — 5 HIAA, QUANTITATIVE, URINE, 24 HOUR
5 HIAA UR: 35.2 mg/L
5-HIAA,QUANT.,24 HR URINE: 31.7 mg/(24.h) — AB (ref 0.0–14.9)
TOTAL VOLUME: 900

## 2017-07-06 ENCOUNTER — Inpatient Hospital Stay: Payer: Medicare Other

## 2017-07-06 VITALS — Resp 16

## 2017-07-06 DIAGNOSIS — C7A029 Malignant carcinoid tumor of the large intestine, unspecified portion: Secondary | ICD-10-CM | POA: Diagnosis not present

## 2017-07-06 DIAGNOSIS — D3A098 Benign carcinoid tumors of other sites: Secondary | ICD-10-CM

## 2017-07-06 LAB — BASIC METABOLIC PANEL
Anion gap: 9 (ref 5–15)
BUN: 15 mg/dL (ref 6–20)
CHLORIDE: 104 mmol/L (ref 101–111)
CO2: 26 mmol/L (ref 22–32)
CREATININE: 0.78 mg/dL (ref 0.44–1.00)
Calcium: 8.6 mg/dL — ABNORMAL LOW (ref 8.9–10.3)
GFR calc Af Amer: >60 mL/min (ref >60)
GFR calc non Af Amer: >60 mL/min (ref >60)
Glucose, Bld: 118 mg/dL — ABNORMAL HIGH (ref 65–99)
POTASSIUM: 3.8 mmol/L (ref 3.5–5.1)
SODIUM: 139 mmol/L (ref 135–145)

## 2017-07-06 MED ORDER — LANREOTIDE ACETATE 120 MG/0.5ML ~~LOC~~ SOLN
120.0000 mg | Freq: Once | SUBCUTANEOUS | Status: AC
  Administered 2017-07-06: 120 mg via SUBCUTANEOUS
  Filled 2017-07-06: qty 120

## 2017-07-06 NOTE — Patient Instructions (Signed)
Lanreotide injection What is this medicine? LANREOTIDE (lan REE oh tide) is used to reduce blood levels of growth hormone in patients with a condition called acromegaly. It also works to slow or stop tumor growth in patients with neuroendocrine tumors and treat carcinoid syndrome. This medicine may be used for other purposes; ask your health care provider or pharmacist if you have questions. COMMON BRAND NAME(S): Somatuline Depot What should I tell my health care provider before I take this medicine? They need to know if you have any of these conditions: -diabetes -gallbladder disease -heart disease -kidney disease -liver disease -thyroid disease -an unusual or allergic reaction to lanreotide, other medicines, foods, dyes, or preservatives -pregnant or trying to get pregnant -breast-feeding How should I use this medicine? This medicine is for injection under the skin. It is given by a health care professional in a hospital or clinic setting. Contact your pediatrician or health care professional regarding the use of this medicine in children. Special care may be needed. Overdosage: If you think you have taken too much of this medicine contact a poison control center or emergency room at once. NOTE: This medicine is only for you. Do not share this medicine with others. What if I miss a dose? It is important not to miss your dose. Call your doctor or health care professional if you are unable to keep an appointment. What may interact with this medicine? This medicine may interact with the following medications: -bromocriptine -cyclosporine -certain medicines for blood pressure, heart disease, irregular heart beat -certain medicines for diabetes -quinidine -terfenadine This list may not describe all possible interactions. Give your health care provider a list of all the medicines, herbs, non-prescription drugs, or dietary supplements you use. Also tell them if you smoke, drink alcohol, or  use illegal drugs. Some items may interact with your medicine. What should I watch for while using this medicine? Tell your doctor or healthcare professional if your symptoms do not start to get better or if they get worse. Visit your doctor or health care professional for regular checks on your progress. Your condition will be monitored carefully while you are receiving this medicine. You may need blood work done while you are taking this medicine. Women should inform their doctor if they wish to become pregnant or think they might be pregnant. There is a potential for serious side effects to an unborn child. Talk to your health care professional or pharmacist for more information. Do not breast-feed an infant while taking this medicine or for 6 months after stopping it. This medicine has caused ovarian failure in some women. This medicine may interfere with the ability to have a child. Talk with your doctor or health care professional if you are concerned about your fertility. What side effects may I notice from receiving this medicine? Side effects that you should report to your doctor or health care professional as soon as possible: -allergic reactions like skin rash, itching or hives, swelling of the face, lips, or tongue -increased blood pressure -severe stomach pain -signs and symptoms of high blood sugar such as dizziness; dry mouth; dry skin; fruity breath; nausea; stomach pain; increased hunger or thirst; increased urination -signs and symptoms of low blood sugar such as feeling anxious; confusion; dizziness; increased hunger; unusually weak or tired; sweating; shakiness; cold; irritable; headache; blurred vision; fast heartbeat; loss of consciousness -unusually slow heartbeat Side effects that usually do not require medical attention (report to your doctor or health care professional if they continue   or are bothersome): -constipation -diarrhea -dizziness -headache -muscle pain -muscle  spasms -nausea -pain, redness, or irritation at site where injected This list may not describe all possible side effects. Call your doctor for medical advice about side effects. You may report side effects to FDA at 1-800-FDA-1088. Where should I keep my medicine? This drug is given in a hospital or clinic and will not be stored at home. NOTE: This sheet is a summary. It may not cover all possible information. If you have questions about this medicine, talk to your doctor, pharmacist, or health care provider.  2018 Elsevier/Gold Standard (2016-03-31 10:33:47)  
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
## 2017-08-03 ENCOUNTER — Inpatient Hospital Stay: Payer: Medicare Other | Attending: Oncology | Admitting: Oncology

## 2017-08-03 ENCOUNTER — Other Ambulatory Visit: Payer: Self-pay

## 2017-08-03 ENCOUNTER — Inpatient Hospital Stay: Payer: Medicare Other

## 2017-08-03 VITALS — BP 130/84 | HR 80 | Temp 97.5°F | Resp 20 | Wt 71.9 lb

## 2017-08-03 DIAGNOSIS — K219 Gastro-esophageal reflux disease without esophagitis: Secondary | ICD-10-CM | POA: Insufficient documentation

## 2017-08-03 DIAGNOSIS — R918 Other nonspecific abnormal finding of lung field: Secondary | ICD-10-CM | POA: Diagnosis not present

## 2017-08-03 DIAGNOSIS — D3A098 Benign carcinoid tumors of other sites: Secondary | ICD-10-CM

## 2017-08-03 DIAGNOSIS — C7B02 Secondary carcinoid tumors of liver: Secondary | ICD-10-CM | POA: Insufficient documentation

## 2017-08-03 DIAGNOSIS — E786 Lipoprotein deficiency: Secondary | ICD-10-CM | POA: Diagnosis not present

## 2017-08-03 DIAGNOSIS — E340 Carcinoid syndrome: Secondary | ICD-10-CM | POA: Diagnosis not present

## 2017-08-03 DIAGNOSIS — E876 Hypokalemia: Secondary | ICD-10-CM | POA: Diagnosis not present

## 2017-08-03 DIAGNOSIS — C7A098 Malignant carcinoid tumors of other sites: Secondary | ICD-10-CM | POA: Insufficient documentation

## 2017-08-03 LAB — CBC WITH DIFFERENTIAL/PLATELET
BASOS ABS: 0.1 10*3/uL (ref 0–0.1)
BASOS PCT: 1 %
Eosinophils Absolute: 0.1 10*3/uL (ref 0–0.7)
Eosinophils Relative: 1 %
HCT: 41.2 % (ref 35.0–47.0)
Hemoglobin: 13.7 g/dL (ref 12.0–16.0)
LYMPHS PCT: 24 %
Lymphs Abs: 2.6 10*3/uL (ref 1.0–3.6)
MCH: 27.3 pg (ref 26.0–34.0)
MCHC: 33.1 g/dL (ref 32.0–36.0)
MCV: 82.3 fL (ref 80.0–100.0)
MONO ABS: 0.6 10*3/uL (ref 0.2–0.9)
Monocytes Relative: 6 %
Neutro Abs: 7.5 10*3/uL — ABNORMAL HIGH (ref 1.4–6.5)
Neutrophils Relative %: 68 %
PLATELETS: 248 10*3/uL (ref 150–440)
RBC: 5.01 MIL/uL (ref 3.80–5.20)
RDW: 15.1 % — AB (ref 11.5–14.5)
WBC: 10.9 10*3/uL (ref 3.6–11.0)

## 2017-08-03 LAB — COMPREHENSIVE METABOLIC PANEL
ALT: 16 U/L (ref 14–54)
ANION GAP: 9 (ref 5–15)
AST: 30 U/L (ref 15–41)
Albumin: 3.5 g/dL (ref 3.5–5.0)
Alkaline Phosphatase: 98 U/L (ref 38–126)
BUN: 19 mg/dL (ref 6–20)
CHLORIDE: 105 mmol/L (ref 101–111)
CO2: 24 mmol/L (ref 22–32)
Calcium: 8.6 mg/dL — ABNORMAL LOW (ref 8.9–10.3)
Creatinine, Ser: 0.72 mg/dL (ref 0.44–1.00)
GFR calc Af Amer: >60 mL/min (ref >60)
Glucose, Bld: 85 mg/dL (ref 65–99)
POTASSIUM: 3.2 mmol/L — AB (ref 3.5–5.1)
Sodium: 138 mmol/L (ref 135–145)
Total Bilirubin: 0.5 mg/dL (ref 0.3–1.2)
Total Protein: 6.8 g/dL (ref 6.5–8.1)

## 2017-08-03 MED ORDER — LANREOTIDE ACETATE 120 MG/0.5ML ~~LOC~~ SOLN
120.0000 mg | Freq: Once | SUBCUTANEOUS | Status: AC
  Administered 2017-08-03: 120 mg via SUBCUTANEOUS
  Filled 2017-08-03: qty 120

## 2017-08-03 NOTE — Progress Notes (Signed)
Patient denies any concerns today.  

## 2017-08-07 LAB — CHROMOGRANIN A: Chromogranin A: 48 nmol/L — ABNORMAL HIGH (ref 0–5)

## 2017-08-31 ENCOUNTER — Inpatient Hospital Stay: Payer: Medicare Other | Attending: Oncology

## 2017-08-31 ENCOUNTER — Inpatient Hospital Stay: Payer: Medicare Other

## 2017-08-31 DIAGNOSIS — C7B02 Secondary carcinoid tumors of liver: Secondary | ICD-10-CM | POA: Insufficient documentation

## 2017-08-31 DIAGNOSIS — D3A098 Benign carcinoid tumors of other sites: Secondary | ICD-10-CM

## 2017-08-31 DIAGNOSIS — C7A029 Malignant carcinoid tumor of the large intestine, unspecified portion: Secondary | ICD-10-CM | POA: Diagnosis not present

## 2017-08-31 LAB — CBC WITH DIFFERENTIAL/PLATELET
BASOS PCT: 1 %
Basophils Absolute: 0.1 10*3/uL (ref 0–0.1)
EOS ABS: 0.2 10*3/uL (ref 0–0.7)
Eosinophils Relative: 2 %
HEMATOCRIT: 45.7 % (ref 35.0–47.0)
HEMOGLOBIN: 15.5 g/dL (ref 12.0–16.0)
LYMPHS ABS: 2.8 10*3/uL (ref 1.0–3.6)
Lymphocytes Relative: 29 %
MCH: 27.6 pg (ref 26.0–34.0)
MCHC: 33.9 g/dL (ref 32.0–36.0)
MCV: 81.5 fL (ref 80.0–100.0)
Monocytes Absolute: 0.8 10*3/uL (ref 0.2–0.9)
Monocytes Relative: 8 %
NEUTROS ABS: 5.7 10*3/uL (ref 1.4–6.5)
Neutrophils Relative %: 60 %
Platelets: 241 10*3/uL (ref 150–440)
RBC: 5.61 MIL/uL — AB (ref 3.80–5.20)
RDW: 15.4 % — ABNORMAL HIGH (ref 11.5–14.5)
WBC: 9.6 10*3/uL (ref 3.6–11.0)

## 2017-08-31 LAB — COMPREHENSIVE METABOLIC PANEL
ALT: 16 U/L (ref 14–54)
ANION GAP: 9 (ref 5–15)
AST: 31 U/L (ref 15–41)
Albumin: 3.8 g/dL (ref 3.5–5.0)
Alkaline Phosphatase: 109 U/L (ref 38–126)
BUN: 17 mg/dL (ref 6–20)
CALCIUM: 9 mg/dL (ref 8.9–10.3)
CHLORIDE: 102 mmol/L (ref 101–111)
CO2: 29 mmol/L (ref 22–32)
CREATININE: 1 mg/dL (ref 0.44–1.00)
GFR, EST NON AFRICAN AMERICAN: 52 mL/min — AB (ref >60)
Glucose, Bld: 108 mg/dL — ABNORMAL HIGH (ref 65–99)
Potassium: 3 mmol/L — ABNORMAL LOW (ref 3.5–5.1)
SODIUM: 140 mmol/L (ref 135–145)
Total Bilirubin: 0.5 mg/dL (ref 0.3–1.2)
Total Protein: 7.4 g/dL (ref 6.5–8.1)

## 2017-08-31 MED ORDER — LANREOTIDE ACETATE 120 MG/0.5ML ~~LOC~~ SOLN
120.0000 mg | Freq: Once | SUBCUTANEOUS | Status: AC
  Administered 2017-08-31: 120 mg via SUBCUTANEOUS
  Filled 2017-08-31: qty 120

## 2017-09-04 LAB — CHROMOGRANIN A: Chromogranin A: 60 nmol/L — ABNORMAL HIGH (ref 0–5)

## 2017-09-15 ENCOUNTER — Other Ambulatory Visit: Payer: Self-pay

## 2017-09-15 ENCOUNTER — Encounter: Payer: Self-pay | Admitting: Gynecology

## 2017-09-15 ENCOUNTER — Ambulatory Visit
Admission: EM | Admit: 2017-09-15 | Discharge: 2017-09-15 | Disposition: A | Discharge status: Home / Self Care | Payer: Medicare Other | Attending: Family Medicine | Admitting: Family Medicine

## 2017-09-15 DIAGNOSIS — R0602 Shortness of breath: Secondary | ICD-10-CM | POA: Diagnosis not present

## 2017-09-15 DIAGNOSIS — R05 Cough: Secondary | ICD-10-CM | POA: Diagnosis not present

## 2017-09-15 DIAGNOSIS — R109 Unspecified abdominal pain: Secondary | ICD-10-CM

## 2017-09-15 DIAGNOSIS — R197 Diarrhea, unspecified: Secondary | ICD-10-CM | POA: Diagnosis not present

## 2017-09-15 DIAGNOSIS — R638 Other symptoms and signs concerning food and fluid intake: Secondary | ICD-10-CM | POA: Diagnosis not present

## 2017-09-15 NOTE — ED Provider Notes (Signed)
[facility]    CSN: [id number] Arrival date & time: 09/15/17  1533  History   Chief Complaint Chief Complaint  Patient presents with  . Diarrhea   HPI  82 year old female with an extensive past medical history including COPD on chronic oxygen, carcinoid with metastatic disease, malnutrition, congestive heart failure presents for evaluation of diarrhea.  Daughter states that she has not been her normal self as of today.  Patient states that this was due to her taking a pain medication at home.  Daughter is concerned about her not eating well and losing weight.  She is also concerned about her poor intake.  This appears to be a chronic issue as she is chronically ill.  Patient states that she is feeling fine.  She has been eating foods that have been upsetting her stomach and she feels like this is worsening her abdominal symptoms and diarrhea.  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  Daughter wants to make sure that she is stable.  She wants her evaluated today.  No other complaints or concerns at this time.  Past Medical History:  Diagnosis Date  . Abnormal MRI 09/10/2015  . Atrial fibrillation ([place])   . Cancer ([place])    Liver  . Cancer ([place])    stomach  . Carcinoid tumor of intestine   . CHF (congestive heart failure) ([place])   . COPD (chronic obstructive pulmonary disease) ([place])   . FH: chemotherapy   . GERD (gastroesophageal reflux disease)   . Hyperlipidemia   . Hypertension   . Macular degeneration   . Polycythemia   . Radiation    Patient Active Problem List   Diagnosis Date Noted  . Abnormal MRI 09/10/2015  . Malnutrition of moderate degree ([place]) 12/06/2014  . Acute respiratory failure with hypoxia ([place]) 12/05/2014  . Pneumonia 12/05/2014  . Septic shock ([place]) 12/05/2014  . COPD (chronic obstructive pulmonary disease) ([place]) 12/05/2014  . 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 Chronic systolic heart failure ([place]) 11/10/2014   Past Surgical  History:  Procedure Laterality Date  . CATARACT EXTRACTION  splenectomy  . COLON RESECTION    . COLON SURGERY    . GASTRECTOMY    . SPLENECTOMY     OB History    No data available     Home Medications    Prior to Admission medications   Medication Sig Start Date End Date Taking? Authorizing Provider  acetaminophen (TYLENOL) 325 MG tablet Take 2 tablets (650 mg total) by mouth every 6 (six) hours as needed for mild pain (or Fever >/= 101). 12/15/14  Yes Tama High III, MD  albuterol (PROVENTIL HFA;VENTOLIN HFA) 108 (90 BASE) MCG/ACT inhaler Inhale 2 puffs into the lungs every 6 (six) hours as needed for wheezing or shortness of breath.   Yes [provider]  albuterol (PROVENTIL) (2.5 MG/3ML) 0.083% nebulizer solution Take 3 mLs (2.5 mg total) by nebulization 3 (three) times daily. 12/15/14  Yes Tama High III, MD  aspirin EC 81 MG tablet Take 81 mg by mouth daily.    Yes [provider]  Cholecalciferol (VITAMIN D-1000 MAX ST) 1000 UNITS tablet Take by mouth.   Yes [provider]  Cyanocobalamin (VITAMIN B-12 CR) 1000 MCG TBCR Take by mouth. 03/02/11  Yes [provider]  famotidine (PEPCID) 20 MG tablet Take by mouth.   Yes [provider]  furosemide (LASIX) 20 MG tablet Take 20 mg by mouth daily.  02/22/15  Yes  [provider]  loperamide (IMODIUM A-D) 2 MG tablet Take 4 mg by mouth 4 (four) times daily as needed for diarrhea or loose stools.   Yes [provider]  Multiple Vitamins-Minerals (CENTRUM SILVER) tablet Take by mouth. 03/02/11  Yes [provider]  ondansetron (ZOFRAN-ODT) 4 MG disintegrating tablet Take by mouth.   Yes [provider]  Potassium Chloride ER 20 MEQ TBCR Take 20 mEq by mouth daily. 05/04/17  Yes Lloyd Huger, MD    Family History Family History  Problem Relation Age of Onset  . Heart attack Sister   . Heart attack Brother   . Leukemia Mother     Social  History Social History   Tobacco Use  . Smoking status: Former Smoker    Types: Cigarettes  . Smokeless tobacco: Never Used  Substance Use Topics  . Alcohol use: No    Alcohol/week: 0.0 oz  . Drug use: No     Allergies   No known allergies   Review of Systems Review of Systems  Constitutional: Positive for appetite change.  Respiratory: Positive for cough and shortness of breath.   Gastrointestinal: Positive for abdominal pain and diarrhea.   Physical Exam Triage Vital Signs ED Triage Vitals  Enc Vitals Group     BP 09/15/17 1605 125/86     Pulse Rate 09/15/17 1605 71     Resp 09/15/17 1605 16     Temp 09/15/17 1605 (!) 97.3 F (36.3 C)     Temp Source 09/15/17 1605 Oral     SpO2 09/15/17 1605 92 %     Weight 09/15/17 1607 68 lb (30.8 kg)     Height --      Head Circumference --      Peak Flow --      Pain Score 09/15/17 1606 0     Pain Loc --      Pain Edu? --      Excl. in [place]? --    Updated Vital Signs BP 125/86 (BP Location: Left Arm)   Pulse 71   Temp (!) 97.3 F (36.3 C) (Oral)   Resp 16   Wt 68 lb (30.8 kg)   SpO2 92%   BMI 13.28 kg/m     Physical Exam  Constitutional: She is oriented to person, place, and time. She appears well-developed. No distress.  HENT:  Head: Normocephalic and atraumatic.  Mouth/Throat: Oropharynx is clear and moist.  Cardiovascular: Normal rate.  Regularly irregular likely secondary to ectopy.  Pulmonary/Chest: No respiratory distress.  Abdominal: Soft. She exhibits no distension.  Neurological: She is alert and oriented to person, place, and time.  Psychiatric: She has a normal mood and affect.  Nursing note and vitals reviewed.  UC Treatments / Results  Labs (all labs ordered are listed, but only abnormal results are displayed) Labs Reviewed - No data to display  EKG  EKG Interpretation None       Radiology No results found.  Procedures Procedures (including critical care time)  Medications Ordered  in UC Medications - No data to display   Initial Impression / Assessment and Plan / UC Course  I have reviewed the triage vital signs and the nursing notes.  Pertinent labs & imaging results that were available during my care of the patient were reviewed by me and considered in my medical decision making (see chart for details).    82 year old female presents for evaluation today. Patient is chronically ill and has multiple comorbidities.  She seems stable clinically.  We had a lengthy discussion about increasing her protein intake.  She is malnourished.  I also encouraged her and her daughter to discuss end-of-life care and end-of-life wishes given her medical problems.  No further intervention needed at this time.  I advised  the daughter that if she worsens or something changes she should go to the hospital as this is the place for her given her history.  Final Clinical Impressions(s) / UC Diagnoses   Final diagnoses:  Diarrhea, unspecified type   ED Discharge Orders    None     Controlled Substance Prescriptions [place] Controlled Substance Registry consulted? Not Applicable     Coral Spikes, DO 09/15/17 1742

## 2017-09-15 NOTE — ED Triage Notes (Signed)
Per patient has liver cancer and the medication the doctor given her is causing her to have diarrhea. Per pt daughter , patient is loosing weight / not eating or drinking/ lethargic.

## 2017-09-17 ENCOUNTER — Inpatient Hospital Stay: Payer: Medicare Other

## 2017-09-17 ENCOUNTER — Other Ambulatory Visit: Payer: Self-pay

## 2017-09-17 ENCOUNTER — Telehealth: Payer: Self-pay | Admitting: *Deleted

## 2017-09-17 ENCOUNTER — Other Ambulatory Visit: Payer: Self-pay | Admitting: *Deleted

## 2017-09-17 ENCOUNTER — Inpatient Hospital Stay (HOSPITAL_BASED_OUTPATIENT_CLINIC_OR_DEPARTMENT_OTHER): Payer: Medicare Other | Admitting: Oncology

## 2017-09-17 ENCOUNTER — Inpatient Hospital Stay: Payer: Medicare Other | Attending: Oncology

## 2017-09-17 VITALS — BP 123/75 | HR 73 | Temp 95.1°F | Resp 18 | Wt <= 1120 oz

## 2017-09-17 DIAGNOSIS — C7A029 Malignant carcinoid tumor of the large intestine, unspecified portion: Secondary | ICD-10-CM | POA: Diagnosis not present

## 2017-09-17 DIAGNOSIS — E860 Dehydration: Secondary | ICD-10-CM

## 2017-09-17 DIAGNOSIS — R1033 Periumbilical pain: Secondary | ICD-10-CM

## 2017-09-17 DIAGNOSIS — D3A098 Benign carcinoid tumors of other sites: Secondary | ICD-10-CM

## 2017-09-17 DIAGNOSIS — C7B02 Secondary carcinoid tumors of liver: Secondary | ICD-10-CM

## 2017-09-17 DIAGNOSIS — E876 Hypokalemia: Secondary | ICD-10-CM | POA: Insufficient documentation

## 2017-09-17 DIAGNOSIS — R197 Diarrhea, unspecified: Secondary | ICD-10-CM

## 2017-09-17 DIAGNOSIS — R112 Nausea with vomiting, unspecified: Secondary | ICD-10-CM

## 2017-09-17 DIAGNOSIS — K529 Noninfective gastroenteritis and colitis, unspecified: Secondary | ICD-10-CM

## 2017-09-17 DIAGNOSIS — R531 Weakness: Secondary | ICD-10-CM

## 2017-09-17 LAB — COMPREHENSIVE METABOLIC PANEL
ALBUMIN: 3.2 g/dL — AB (ref 3.5–5.0)
ALK PHOS: 96 U/L (ref 38–126)
ALT: 13 U/L — AB (ref 14–54)
ANION GAP: 8 (ref 5–15)
AST: 25 U/L (ref 15–41)
BUN: 16 mg/dL (ref 6–20)
CHLORIDE: 103 mmol/L (ref 101–111)
CO2: 28 mmol/L (ref 22–32)
CREATININE: 0.63 mg/dL (ref 0.44–1.00)
Calcium: 8.8 mg/dL — ABNORMAL LOW (ref 8.9–10.3)
GFR calc Af Amer: >60 mL/min (ref >60)
GFR calc non Af Amer: >60 mL/min (ref >60)
GLUCOSE: 97 mg/dL (ref 65–99)
Potassium: 3.1 mmol/L — ABNORMAL LOW (ref 3.5–5.1)
SODIUM: 139 mmol/L (ref 135–145)
Total Bilirubin: 0.4 mg/dL (ref 0.3–1.2)
Total Protein: 6.6 g/dL (ref 6.5–8.1)

## 2017-09-17 LAB — CBC WITH DIFFERENTIAL/PLATELET
BASOS PCT: 1 %
Basophils Absolute: 0.1 10*3/uL (ref 0–0.1)
EOS PCT: 1 %
Eosinophils Absolute: 0.1 10*3/uL (ref 0–0.7)
HCT: 41.4 % (ref 35.0–47.0)
HEMOGLOBIN: 14 g/dL (ref 12.0–16.0)
LYMPHS ABS: 2 10*3/uL (ref 1.0–3.6)
Lymphocytes Relative: 18 %
MCH: 27.9 pg (ref 26.0–34.0)
MCHC: 33.9 g/dL (ref 32.0–36.0)
MCV: 82.4 fL (ref 80.0–100.0)
Monocytes Absolute: 0.9 10*3/uL (ref 0.2–0.9)
Monocytes Relative: 8 %
NEUTROS PCT: 72 %
Neutro Abs: 7.7 10*3/uL — ABNORMAL HIGH (ref 1.4–6.5)
PLATELETS: 214 10*3/uL (ref 150–440)
RBC: 5.03 MIL/uL (ref 3.80–5.20)
RDW: 15.2 % — ABNORMAL HIGH (ref 11.5–14.5)
WBC: 10.7 10*3/uL (ref 3.6–11.0)

## 2017-09-17 LAB — MAGNESIUM: MAGNESIUM: 2 mg/dL (ref 1.7–2.4)

## 2017-09-17 MED ORDER — DEXAMETHASONE SODIUM PHOSPHATE 10 MG/ML IJ SOLN
10.0000 mg | Freq: Once | INTRAMUSCULAR | Status: AC
  Administered 2017-09-17: 10 mg via INTRAVENOUS
  Filled 2017-09-17: qty 1

## 2017-09-17 MED ORDER — SODIUM CHLORIDE 0.9 % IV SOLN: Freq: Once | INTRAVENOUS | Status: DC

## 2017-09-17 MED ORDER — PROCHLORPERAZINE MALEATE 10 MG PO TABS: 10.0000 mg | ORAL_TABLET | Freq: Four times a day (QID) | ORAL | 0 refills | Status: DC | PRN

## 2017-09-17 MED ORDER — TRAMADOL HCL 50 MG PO TABS: 25.0000 mg | ORAL_TABLET | Freq: Four times a day (QID) | ORAL | 0 refills | Status: DC | PRN

## 2017-09-17 MED ORDER — ONDANSETRON HCL 8 MG PO TABS: 8.0000 mg | ORAL_TABLET | Freq: Three times a day (TID) | ORAL | 0 refills | Status: DC | PRN

## 2017-09-17 MED ORDER — SODIUM CHLORIDE 0.9 % IV SOLN
INTRAVENOUS | Status: DC
  Administered 2017-09-17: 15:00:00 via INTRAVENOUS
  Filled 2017-09-17 (×2): qty 1000

## 2017-09-17 MED ORDER — ONDANSETRON HCL 4 MG/2ML IJ SOLN
8.0000 mg | Freq: Once | INTRAMUSCULAR | Status: AC
  Administered 2017-09-17: 8 mg via INTRAVENOUS
  Filled 2017-09-17: qty 4

## 2017-09-17 NOTE — Telephone Encounter (Signed)
Pt daughter called and was very concerned about her mom Veronica Wang "[name]. She has been sick since last Thursday. She took her to Urgent care Friday. She has had Diarrhea and vomitting. Pt hasn't eat much and tried to drink liquids. The urgent care did nothing for her but tell her to get affairs in order. The daughter feels like she might need some fluids. Will you please call the daughter and access [name].

## 2017-09-17 NOTE — Progress Notes (Signed)
Here today for sx mgt to see Veronica Baller NP since Thursday 09/14/17 she has felt weaker and staying in bed. C/o L L abd qaud t pain per pt and pain has been 9/10 pain level. Stated she took Tylenol w out effect. Stated she eating small amt of food daily -gave eg of chicken dumplings,green beans and cornbread muffin. This am had oatmeal 2 pieces of toast,and an ensure . Today had 2 formed stools and 2 loose watery stools. Stated she went to [facility] clinic and told she needed fluids -but none given. Pt stated she has tried to drink more fluids -today has had 2.5 cups of coffee,one ensure and half glass water. Here stating she doesn't feel as weak as she did over the week end.

## 2017-09-17 NOTE — Telephone Encounter (Signed)
Call returned to daughter and appointment accepted for 130

## 2017-09-17 NOTE — Telephone Encounter (Signed)
Coming in at 1:30

## 2017-09-17 NOTE — Progress Notes (Signed)
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 Grayland Ormond on 08/03/2017 for continuation of monthly Somotuline. 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 Most recent MRI from October 2018 revealed stable disease. Her chromogranin A and 20 for urine 5 HIAA have decreased indicating a response to therapy. She is to continue 120 mg subcutaneous Somatuline every 28 days until progression. We will continue to monitor patient with repeat MRI of abdomen every 6 months. Patient complains of mild diarrhea controlled with Imodium and continued weight loss. Otherwise she has been doing well.  In the interim patient was seen at [facility] urgent care for abdominal pain. Apparently, patient's daughter felt she was dehydrated due to little intake of food and fluids over the past several days. Patient stated she was fine and has been eating foods that are upsetting her stomach causing her to have abdominal pain and significant diarrhea. Essentially, no tests or labs were drawn. She was encouraged to increase her protein and they discussed at length, end-of-life care and wishes given her medical co-morbidities. She was discharged in stable condition.   Interval history-  Veronica Wang complains of pain with several episodes of nausea and vomiting. Has diarrhea daily. Takes immodium daily with some relief. Her pain is located: Abdomen   Laterality: Center Her pain is: Yes, Describe:  chronic and intermittent.She has had these symptoms for 3 days.  She describes this pain as:  intermittent She describes severity of his pain as:  Getting better her symptoms occur all day  She  has not had trauma or changes in activity. Her pain improves with time Her pain is worsened with: Eating foods she should not eat such as acidic foods.  She has tried over-the-counter medications. If so these include: Aspirin, Tylenol and Prilosec She reports that these have been of benefit: no  ECOG FS:1 - Symptomatic but completely ambulatory  Review of systems- Review of Systems  Constitutional: Negative.  Negative for chills, fever, malaise/fatigue and weight loss.  HENT: Negative for congestion and ear pain.   Eyes: Negative.  Negative for blurred vision and double vision.  Respiratory: Negative.  Negative for cough, sputum production and shortness of breath.   Cardiovascular: Negative.  Negative for chest pain, palpitations and leg swelling.  Gastrointestinal: Positive for abdominal pain, diarrhea, nausea and vomiting. Negative for constipation.  Genitourinary: Negative for dysuria, frequency and urgency.  Musculoskeletal: Negative for back pain and falls.  Skin: Negative.  Negative for rash.  Neurological: Negative.  Negative for weakness and headaches.  Endo/Heme/Allergies: Negative.  Does not bruise/bleed easily.  Psychiatric/Behavioral: Negative.  Negative for depression. The patient is not nervous/anxious and does not have insomnia.      Current treatment- Monthly Somotuline  Allergies  Allergen Reactions  . No Known Allergies      Past Medical History:  Diagnosis Date  . Abnormal MRI 09/10/2015  . Atrial fibrillation ([place])   . Cancer ([place])    Liver  . Cancer ([place])    stomach  . Carcinoid tumor of intestine   . CHF (congestive heart  failure) ([place])   . COPD (chronic obstructive pulmonary disease) ([place])   . FH: chemotherapy   . GERD (gastroesophageal reflux disease)   . Hyperlipidemia   . Hypertension   . Macular degeneration   . Polycythemia   . Radiation       Past Surgical History:  Procedure Laterality Date  . CATARACT EXTRACTION  splenectomy  . COLON RESECTION    . COLON SURGERY    . GASTRECTOMY    . SPLENECTOMY      Social History   Socioeconomic History  . Marital status: Divorced    Spouse name: Not on file  . Number of children: Not on file  . Years of education: Not on file  . Highest education level: Not on file  Social Needs  . Financial resource strain: Not on file  . Food insecurity - worry: Not on file  . Food insecurity - inability: Not on file  . Transportation needs - medical: Not on file  . Transportation needs - non-medical: Not on file  Occupational History  . Not on file  Tobacco Use  . Smoking status: Former Smoker    Types: Cigarettes  . Smokeless tobacco: Never Used  Substance and Sexual Activity  . Alcohol use: No    Alcohol/week: 0.0 oz  . Drug use: No  . Sexual activity: Not on file  Other Topics Concern  . Not on file  Social History Narrative  . Not on file    Family History  Problem Relation Age of Onset  . Heart attack Sister   . Heart attack Brother   . Leukemia Mother      Current Outpatient Medications:  .  aspirin EC 81 MG tablet, Take 81 mg by mouth daily. , Disp: , Rfl:  .  Cholecalciferol (VITAMIN D-1000 MAX ST) 1000 UNITS tablet, Take by mouth., Disp: , Rfl:  .  Cyanocobalamin (VITAMIN B-12 CR) 1000 MCG TBCR, Take by mouth., Disp: , Rfl:  .  famotidine (PEPCID) 20 MG tablet, Take by mouth., Disp: , Rfl:  .  loperamide (IMODIUM A-D) 2 MG tablet, Take 4 mg by mouth 4 (four) times daily as needed for diarrhea or loose stools., Disp: , Rfl:  .  Multiple Vitamins-Minerals (CENTRUM SILVER) tablet, Take by mouth., Disp: , Rfl:  .  acetaminophen (TYLENOL) 325 MG tablet, Take 2 tablets (650 mg total) by mouth every 6 (six) hours as needed for mild pain (or Fever >/= 101). (Patient not taking: Reported on 09/17/2017), Disp: 30 tablet, Rfl: 1 .  albuterol (PROVENTIL HFA;VENTOLIN HFA)  108 (90 BASE) MCG/ACT inhaler, Inhale 2 puffs into the lungs every 6 (six) hours as needed for wheezing or shortness of breath., Disp: , Rfl:  .  albuterol (PROVENTIL) (2.5 MG/3ML) 0.083% nebulizer solution, Take 3 mLs (2.5 mg total) by nebulization 3 (three) times daily. (Patient not taking: Reported on 09/17/2017), Disp: 75 mL, Rfl: 12 .  furosemide (LASIX) 20 MG tablet, Take 20 mg by mouth daily. , Disp: , Rfl:  .  ondansetron (ZOFRAN) 8 MG tablet, Take 1 tablet (8 mg total) by mouth every 8 (eight) hours as needed for nausea or vomiting., Disp: 30 tablet, Rfl: 0 .  ondansetron (ZOFRAN-ODT) 4 MG disintegrating tablet, Take by mouth., Disp: , Rfl:  .  Potassium Chloride ER 20 MEQ TBCR, Take 20 mEq by mouth daily. (Patient not taking: Reported on 09/17/2017), Disp: 30 tablet, Rfl: 1 .  prochlorperazine (COMPAZINE) 10 MG tablet, Take 1 tablet (10 mg  total) by mouth every 6 (six) hours as needed for nausea or vomiting., Disp: 30 tablet, Rfl: 0 .  traMADol (ULTRAM) 50 MG tablet, Take 0.5 tablets (25 mg total) by mouth every 6 (six) hours as needed., Disp: 45 tablet, Rfl: 0 No current facility-administered medications for this visit.   Facility-Administered Medications Ordered in Other Visits:  .  0.9 %  sodium chloride infusion, , Intravenous, Continuous, Dorma Altman, Wandra Feinstein, NP, Stopped at 09/17/17 1630  Physical exam:  Vitals:   09/17/17 1432  BP: 123/75  Pulse: 73  Resp: 18  Temp: (!) 95.1 F (35.1 C)  TempSrc: Tympanic  Weight: 68 lb 9.6 oz (31.1 kg)   Physical Exam  Constitutional: She is oriented to person, place, and time. Vital signs are normal. She appears malnourished.  HENT:  Head: Normocephalic and atraumatic.  Eyes: Pupils are equal, round, and reactive to light.  Neck: Normal range of motion.  Cardiovascular: Normal rate, regular rhythm and normal heart sounds.  No murmur heard. Pulmonary/Chest: Effort normal and breath sounds normal. She has no wheezes.  Abdominal: Soft.  Normal appearance and bowel sounds are normal. She exhibits no distension. There is tenderness in the periumbilical area.  Musculoskeletal: Normal range of motion. She exhibits no edema.  Neurological: She is alert and oriented to person, place, and time. Gait normal.  Skin: Skin is warm and dry. No rash noted.  Psychiatric: Mood, memory, affect and judgment normal.     CMP Latest Ref Rng & Units 09/17/2017  Glucose 65 - 99 mg/dL 97  BUN 6 - 20 mg/dL 16  Creatinine 0.44 - 1.00 mg/dL 0.63  Sodium 135 - 145 mmol/L 139  Potassium 3.5 - 5.1 mmol/L 3.1(L)  Chloride 101 - 111 mmol/L 103  CO2 22 - 32 mmol/L 28  Calcium 8.9 - 10.3 mg/dL 8.8(L)  Total Protein 6.5 - 8.1 g/dL 6.6  Total Bilirubin 0.3 - 1.2 mg/dL 0.4  Alkaline Phos 38 - 126 U/L 96  AST 15 - 41 U/L 25  ALT 14 - 54 U/L 13(L)   CBC Latest Ref Rng & Units 09/17/2017  WBC 3.6 - 11.0 K/uL 10.7  Hemoglobin 12.0 - 16.0 g/dL 14.0  Hematocrit 35.0 - 47.0 % 41.4  Platelets 150 - 440 K/uL 214    No images are attached to the encounter.  No results found.   Assessment and plan- Patient is a 82 y.o. female who presents to symptom management for abdominal pain 3 days.  1. Carcinoid of intestines with metastases to the liver: Patient currently receiving monthly Somotuline. She has been tolerating this well without significant side effects. This recent MRI of abdomen was promising for response to therapy. Continue follow-up with Dr. Grayland Ormond and monthly injections.  2. Abdominal pain: Likely due to carcinoid. Patient states she has been eating "acidic foods". She states she knows she is not supposed to eat these foods.  Stopping the problem foods typically resolve the abdominal pain. Patient does not have pain medication and typically uses tylenol for her abdominal pain. The patient is asking for something stronger because her abdominal pain is worsening. RX Tramadol 25 mg q 6 hours PRN for pain. Consulted Dr. Grayland Ormond primary oncologist.  Given patient's weight (68lbs) and frail state, will start with a low dose pain medication at this time. Patient is agreeable to this.  3. Weakness/chronic diarrhea/dehydration: We will give 1 L normal saline today in clinic as well as Zofran and Decadron. Will also prescribed RX Zofran 8 mg  PRN q 8 for nausea and RX Compazine 10 mg tablets PRN q 6 for nausea. Due to monetary constraints, we will let her see the cost of both of these medications and choose based on price. Patient is in agreeance with this.   4. Hypokalemia: This is a chronic problem for her. Likley due to chronic diarrhea and daily lasix. Continue Potassium 20 MEQ as prescribed. Patient denies any tachycardia, abnormal heart rhythms, chest pain or shortness of breath. Patient last several potassium readings have been low. We will continue to monitor this.     Visit Diagnosis 1. Carcinoid tumor of intestine   2. Periumbilical abdominal pain   3. Weakness   4. Dehydration   5. Chronic diarrhea   6. Hypokalemia     Patient expressed understanding and was in agreement with this plan. She also understands that She can call clinic at any time with any questions, concerns, or complaints.   Greater than 50% was spent in counseling and coordination of care with this patient including but not limited to discussion of the relevant topics above (See A&P) including, but not limited to diagnosis and management of acute and chronic medical conditions.    Faythe Casa, AGNP-C [facility] [place]- [phone] Pager- [phone] 09/19/2017 9:50 AM

## 2017-09-28 ENCOUNTER — Inpatient Hospital Stay: Payer: Medicare Other

## 2017-09-28 DIAGNOSIS — D3A098 Benign carcinoid tumors of other sites: Secondary | ICD-10-CM

## 2017-09-28 DIAGNOSIS — C7A029 Malignant carcinoid tumor of the large intestine, unspecified portion: Secondary | ICD-10-CM | POA: Diagnosis not present

## 2017-09-28 LAB — COMPREHENSIVE METABOLIC PANEL
ALT: 15 U/L (ref 14–54)
AST: 28 U/L (ref 15–41)
Albumin: 3.2 g/dL — ABNORMAL LOW (ref 3.5–5.0)
Alkaline Phosphatase: 104 U/L (ref 38–126)
Anion gap: 11 (ref 5–15)
BILIRUBIN TOTAL: 0.4 mg/dL (ref 0.3–1.2)
BUN: 15 mg/dL (ref 6–20)
CHLORIDE: 101 mmol/L (ref 101–111)
CO2: 28 mmol/L (ref 22–32)
CREATININE: 0.57 mg/dL (ref 0.44–1.00)
Calcium: 9 mg/dL (ref 8.9–10.3)
GFR calc Af Amer: >60 mL/min (ref >60)
GLUCOSE: 116 mg/dL — AB (ref 65–99)
Potassium: 3.2 mmol/L — ABNORMAL LOW (ref 3.5–5.1)
Sodium: 140 mmol/L (ref 135–145)
TOTAL PROTEIN: 7.1 g/dL (ref 6.5–8.1)

## 2017-09-28 LAB — CBC WITH DIFFERENTIAL/PLATELET
Basophils Absolute: 0.1 10*3/uL (ref 0–0.1)
Basophils Relative: 1 %
EOS ABS: 0.2 10*3/uL (ref 0–0.7)
Eosinophils Relative: 1 %
HCT: 41.3 % (ref 35.0–47.0)
HEMOGLOBIN: 13.8 g/dL (ref 12.0–16.0)
LYMPHS ABS: 2.5 10*3/uL (ref 1.0–3.6)
Lymphocytes Relative: 20 %
MCH: 27.3 pg (ref 26.0–34.0)
MCHC: 33.3 g/dL (ref 32.0–36.0)
MCV: 82 fL (ref 80.0–100.0)
Monocytes Absolute: 0.9 10*3/uL (ref 0.2–0.9)
Monocytes Relative: 7 %
NEUTROS ABS: 8.9 10*3/uL — AB (ref 1.4–6.5)
NEUTROS PCT: 71 %
Platelets: 321 10*3/uL (ref 150–440)
RBC: 5.04 MIL/uL (ref 3.80–5.20)
RDW: 15.3 % — ABNORMAL HIGH (ref 11.5–14.5)
WBC: 12.6 10*3/uL — ABNORMAL HIGH (ref 3.6–11.0)

## 2017-09-28 MED ORDER — LANREOTIDE ACETATE 120 MG/0.5ML ~~LOC~~ SOLN
120.0000 mg | Freq: Once | SUBCUTANEOUS | Status: AC
  Administered 2017-09-28: 120 mg via SUBCUTANEOUS
  Filled 2017-09-28: qty 120

## 2017-10-03 LAB — CHROMOGRANIN A: Chromogranin A: 42 nmol/L — ABNORMAL HIGH (ref 0–5)
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
## 2017-10-23 ENCOUNTER — Ambulatory Visit
Admission: RE | Admit: 2017-10-23 | Discharge: 2017-10-23 | Disposition: A | Discharge status: Home / Self Care | Payer: Medicare Other | Source: Ambulatory Visit | Attending: Oncology | Admitting: Oncology

## 2017-10-23 DIAGNOSIS — D3A098 Benign carcinoid tumors of other sites: Secondary | ICD-10-CM | POA: Diagnosis present

## 2017-10-23 DIAGNOSIS — C787 Secondary malignant neoplasm of liver and intrahepatic bile duct: Secondary | ICD-10-CM | POA: Diagnosis not present

## 2017-10-23 DIAGNOSIS — C772 Secondary and unspecified malignant neoplasm of intra-abdominal lymph nodes: Secondary | ICD-10-CM | POA: Insufficient documentation

## 2017-10-23 DIAGNOSIS — C7951 Secondary malignant neoplasm of bone: Secondary | ICD-10-CM | POA: Insufficient documentation

## 2017-10-23 MED ORDER — GADOBENATE DIMEGLUMINE 529 MG/ML IV SOLN
5.0000 mL | Freq: Once | INTRAVENOUS | Status: AC | PRN
  Administered 2017-10-23: 5 mL via INTRAVENOUS

## 2017-10-26 ENCOUNTER — Inpatient Hospital Stay: Payer: Medicare Other | Attending: Oncology | Admitting: Oncology

## 2017-10-26 ENCOUNTER — Encounter: Payer: Self-pay | Admitting: Oncology

## 2017-10-26 ENCOUNTER — Inpatient Hospital Stay: Payer: Medicare Other

## 2017-10-26 ENCOUNTER — Other Ambulatory Visit: Payer: Self-pay

## 2017-10-26 VITALS — BP 147/74 | HR 75 | Temp 97.9°F | Resp 18 | Ht 60.0 in | Wt <= 1120 oz

## 2017-10-26 DIAGNOSIS — E876 Hypokalemia: Secondary | ICD-10-CM | POA: Insufficient documentation

## 2017-10-26 DIAGNOSIS — R197 Diarrhea, unspecified: Secondary | ICD-10-CM | POA: Insufficient documentation

## 2017-10-26 DIAGNOSIS — D3A098 Benign carcinoid tumors of other sites: Secondary | ICD-10-CM

## 2017-10-26 DIAGNOSIS — C7B02 Secondary carcinoid tumors of liver: Secondary | ICD-10-CM | POA: Insufficient documentation

## 2017-10-26 DIAGNOSIS — R634 Abnormal weight loss: Secondary | ICD-10-CM | POA: Insufficient documentation

## 2017-10-26 DIAGNOSIS — C7A029 Malignant carcinoid tumor of the large intestine, unspecified portion: Secondary | ICD-10-CM | POA: Insufficient documentation

## 2017-10-26 LAB — COMPREHENSIVE METABOLIC PANEL
ALBUMIN: 3.9 g/dL (ref 3.5–5.0)
ALT: 17 U/L (ref 14–54)
ANION GAP: 9 (ref 5–15)
AST: 32 U/L (ref 15–41)
Alkaline Phosphatase: 108 U/L (ref 38–126)
BUN: 14 mg/dL (ref 6–20)
CALCIUM: 9.2 mg/dL (ref 8.9–10.3)
CHLORIDE: 103 mmol/L (ref 101–111)
CO2: 31 mmol/L (ref 22–32)
Creatinine, Ser: 0.7 mg/dL (ref 0.44–1.00)
GFR calc non Af Amer: >60 mL/min (ref >60)
GLUCOSE: 125 mg/dL — AB (ref 65–99)
POTASSIUM: 3.2 mmol/L — AB (ref 3.5–5.1)
SODIUM: 143 mmol/L (ref 135–145)
Total Bilirubin: 0.6 mg/dL (ref 0.3–1.2)
Total Protein: 7.6 g/dL (ref 6.5–8.1)

## 2017-10-26 LAB — CBC WITH DIFFERENTIAL/PLATELET
BASOS PCT: 1 %
Basophils Absolute: 0.1 10*3/uL (ref 0–0.1)
EOS ABS: 0.1 10*3/uL (ref 0–0.7)
Eosinophils Relative: 1 %
HCT: 43 % (ref 35.0–47.0)
HEMOGLOBIN: 14.4 g/dL (ref 12.0–16.0)
LYMPHS ABS: 2.7 10*3/uL (ref 1.0–3.6)
Lymphocytes Relative: 30 %
MCH: 27.1 pg (ref 26.0–34.0)
MCHC: 33.5 g/dL (ref 32.0–36.0)
MCV: 80.9 fL (ref 80.0–100.0)
MONOS PCT: 8 %
Monocytes Absolute: 0.7 10*3/uL (ref 0.2–0.9)
NEUTROS PCT: 60 %
Neutro Abs: 5.5 10*3/uL (ref 1.4–6.5)
Platelets: 271 10*3/uL (ref 150–440)
RBC: 5.32 MIL/uL — ABNORMAL HIGH (ref 3.80–5.20)
RDW: 15.4 % — AB (ref 11.5–14.5)
WBC: 9.2 10*3/uL (ref 3.6–11.0)

## 2017-10-26 MED ORDER — LANREOTIDE ACETATE 120 MG/0.5ML ~~LOC~~ SOLN
120.0000 mg | Freq: Once | SUBCUTANEOUS | Status: AC
  Administered 2017-10-26: 120 mg via SUBCUTANEOUS
  Filled 2017-10-26: qty 120

## 2017-10-26 NOTE — Progress Notes (Signed)
Patient reports 3 loose stools per day. Patient takes 1 imodium per day.

## 2017-10-30 LAB — CHROMOGRANIN A: CHROMOGRANIN A: 60 nmol/L — AB (ref 0–5)

## 2017-11-23 ENCOUNTER — Other Ambulatory Visit: Payer: Self-pay | Admitting: *Deleted

## 2017-11-23 ENCOUNTER — Inpatient Hospital Stay: Payer: Medicare Other | Attending: Oncology

## 2017-11-23 ENCOUNTER — Inpatient Hospital Stay: Payer: Medicare Other

## 2017-11-23 VITALS — Resp 16

## 2017-11-23 DIAGNOSIS — C7A029 Malignant carcinoid tumor of the large intestine, unspecified portion: Secondary | ICD-10-CM | POA: Diagnosis not present

## 2017-11-23 DIAGNOSIS — R197 Diarrhea, unspecified: Secondary | ICD-10-CM | POA: Insufficient documentation

## 2017-11-23 DIAGNOSIS — C7B02 Secondary carcinoid tumors of liver: Secondary | ICD-10-CM | POA: Diagnosis present

## 2017-11-23 DIAGNOSIS — D3A098 Benign carcinoid tumors of other sites: Secondary | ICD-10-CM

## 2017-11-23 DIAGNOSIS — E876 Hypokalemia: Secondary | ICD-10-CM

## 2017-11-23 LAB — CBC WITH DIFFERENTIAL/PLATELET
Basophils Absolute: 0.1 10*3/uL (ref 0–0.1)
Basophils Relative: 1 %
EOS PCT: 4 %
Eosinophils Absolute: 0.4 10*3/uL (ref 0–0.7)
HCT: 40.7 % (ref 35.0–47.0)
Hemoglobin: 13.6 g/dL (ref 12.0–16.0)
LYMPHS ABS: 2.4 10*3/uL (ref 1.0–3.6)
LYMPHS PCT: 24 %
MCH: 27 pg (ref 26.0–34.0)
MCHC: 33.4 g/dL (ref 32.0–36.0)
MCV: 80.7 fL (ref 80.0–100.0)
Monocytes Absolute: 0.7 10*3/uL (ref 0.2–0.9)
Monocytes Relative: 7 %
Neutro Abs: 6.2 10*3/uL (ref 1.4–6.5)
Neutrophils Relative %: 64 %
PLATELETS: 253 10*3/uL (ref 150–440)
RBC: 5.04 MIL/uL (ref 3.80–5.20)
RDW: 15.6 % — ABNORMAL HIGH (ref 11.5–14.5)
WBC: 9.7 10*3/uL (ref 3.6–11.0)

## 2017-11-23 LAB — COMPREHENSIVE METABOLIC PANEL
ALK PHOS: 95 U/L (ref 38–126)
ALT: 14 U/L (ref 14–54)
ANION GAP: 12 (ref 5–15)
AST: 34 U/L (ref 15–41)
Albumin: 3.6 g/dL (ref 3.5–5.0)
BILIRUBIN TOTAL: 0.5 mg/dL (ref 0.3–1.2)
BUN: 17 mg/dL (ref 6–20)
CALCIUM: 8.6 mg/dL — AB (ref 8.9–10.3)
CO2: 28 mmol/L (ref 22–32)
Chloride: 102 mmol/L (ref 101–111)
Creatinine, Ser: 0.78 mg/dL (ref 0.44–1.00)
Glucose, Bld: 108 mg/dL — ABNORMAL HIGH (ref 65–99)
POTASSIUM: 2.4 mmol/L — AB (ref 3.5–5.1)
Sodium: 142 mmol/L (ref 135–145)
TOTAL PROTEIN: 6.6 g/dL (ref 6.5–8.1)

## 2017-11-23 MED ORDER — LANREOTIDE ACETATE 120 MG/0.5ML ~~LOC~~ SOLN
120.0000 mg | Freq: Once | SUBCUTANEOUS | Status: AC
  Administered 2017-11-23: 120 mg via SUBCUTANEOUS
  Filled 2017-11-23: qty 120

## 2017-11-23 MED ORDER — POTASSIUM CHLORIDE CRYS ER 20 MEQ PO TBCR: 40.0000 meq | EXTENDED_RELEASE_TABLET | Freq: Two times a day (BID) | ORAL | 0 refills | Status: DC

## 2017-11-23 NOTE — Patient Instructions (Signed)
Lanreotide injection What is this medicine? LANREOTIDE (lan REE oh tide) is used to reduce blood levels of growth hormone in patients with a condition called acromegaly. It also works to slow or stop tumor growth in patients with neuroendocrine tumors and treat carcinoid syndrome. This medicine may be used for other purposes; ask your health care provider or pharmacist if you have questions. COMMON BRAND NAME(S): Somatuline Depot What should I tell my health care provider before I take this medicine? They need to know if you have any of these conditions: -diabetes -gallbladder disease -heart disease -kidney disease -liver disease -thyroid disease -an unusual or allergic reaction to lanreotide, other medicines, foods, dyes, or preservatives -pregnant or trying to get pregnant -breast-feeding How should I use this medicine? This medicine is for injection under the skin. It is given by a health care professional in a hospital or clinic setting. Contact your pediatrician or health care professional regarding the use of this medicine in children. Special care may be needed. Overdosage: If you think you have taken too much of this medicine contact a poison control center or emergency room at once. NOTE: This medicine is only for you. Do not share this medicine with others. What if I miss a dose? It is important not to miss your dose. Call your doctor or health care professional if you are unable to keep an appointment. What may interact with this medicine? This medicine may interact with the following medications: -bromocriptine -cyclosporine -certain medicines for blood pressure, heart disease, irregular heart beat -certain medicines for diabetes -quinidine -terfenadine This list may not describe all possible interactions. Give your health care provider a list of all the medicines, herbs, non-prescription drugs, or dietary supplements you use. Also tell them if you smoke, drink alcohol, or  use illegal drugs. Some items may interact with your medicine. What should I watch for while using this medicine? Tell your doctor or healthcare professional if your symptoms do not start to get better or if they get worse. Visit your doctor or health care professional for regular checks on your progress. Your condition will be monitored carefully while you are receiving this medicine. You may need blood work done while you are taking this medicine. Women should inform their doctor if they wish to become pregnant or think they might be pregnant. There is a potential for serious side effects to an unborn child. Talk to your health care professional or pharmacist for more information. Do not breast-feed an infant while taking this medicine or for 6 months after stopping it. This medicine has caused ovarian failure in some women. This medicine may interfere with the ability to have a child. Talk with your doctor or health care professional if you are concerned about your fertility. What side effects may I notice from receiving this medicine? Side effects that you should report to your doctor or health care professional as soon as possible: -allergic reactions like skin rash, itching or hives, swelling of the face, lips, or tongue -increased blood pressure -severe stomach pain -signs and symptoms of high blood sugar such as dizziness; dry mouth; dry skin; fruity breath; nausea; stomach pain; increased hunger or thirst; increased urination -signs and symptoms of low blood sugar such as feeling anxious; confusion; dizziness; increased hunger; unusually weak or tired; sweating; shakiness; cold; irritable; headache; blurred vision; fast heartbeat; loss of consciousness -unusually slow heartbeat Side effects that usually do not require medical attention (report to your doctor or health care professional if they continue   or are bothersome): -constipation -diarrhea -dizziness -headache -muscle pain -muscle  spasms -nausea -pain, redness, or irritation at site where injected This list may not describe all possible side effects. Call your doctor for medical advice about side effects. You may report side effects to FDA at 1-800-FDA-1088. Where should I keep my medicine? This drug is given in a hospital or clinic and will not be stored at home. NOTE: This sheet is a summary. It may not cover all possible information. If you have questions about this medicine, talk to your doctor, pharmacist, or health care provider.  2018 Elsevier/Gold Standard (2016-03-31 10:33:47)  

## 2017-11-26 ENCOUNTER — Ambulatory Visit: Payer: Medicare Other | Attending: Family | Admitting: Family

## 2017-11-26 ENCOUNTER — Encounter: Payer: Self-pay | Admitting: Family

## 2017-11-26 VITALS — BP 123/65 | HR 85 | Resp 18 | Ht 60.0 in | Wt <= 1120 oz

## 2017-11-26 DIAGNOSIS — I480 Paroxysmal atrial fibrillation: Secondary | ICD-10-CM

## 2017-11-26 DIAGNOSIS — Z8249 Family history of ischemic heart disease and other diseases of the circulatory system: Secondary | ICD-10-CM | POA: Insufficient documentation

## 2017-11-26 DIAGNOSIS — I509 Heart failure, unspecified: Secondary | ICD-10-CM | POA: Diagnosis present

## 2017-11-26 DIAGNOSIS — J449 Chronic obstructive pulmonary disease, unspecified: Secondary | ICD-10-CM | POA: Diagnosis not present

## 2017-11-26 DIAGNOSIS — I5032 Chronic diastolic (congestive) heart failure: Secondary | ICD-10-CM | POA: Diagnosis not present

## 2017-11-26 DIAGNOSIS — E785 Hyperlipidemia, unspecified: Secondary | ICD-10-CM | POA: Diagnosis not present

## 2017-11-26 DIAGNOSIS — Z9081 Acquired absence of spleen: Secondary | ICD-10-CM | POA: Diagnosis not present

## 2017-11-26 DIAGNOSIS — Z79899 Other long term (current) drug therapy: Secondary | ICD-10-CM | POA: Diagnosis not present

## 2017-11-26 DIAGNOSIS — H3530 Unspecified macular degeneration: Secondary | ICD-10-CM | POA: Diagnosis not present

## 2017-11-26 DIAGNOSIS — I4891 Unspecified atrial fibrillation: Secondary | ICD-10-CM | POA: Diagnosis not present

## 2017-11-26 DIAGNOSIS — K219 Gastro-esophageal reflux disease without esophagitis: Secondary | ICD-10-CM | POA: Insufficient documentation

## 2017-11-26 DIAGNOSIS — Z923 Personal history of irradiation: Secondary | ICD-10-CM | POA: Diagnosis not present

## 2017-11-26 DIAGNOSIS — Z7982 Long term (current) use of aspirin: Secondary | ICD-10-CM | POA: Diagnosis not present

## 2017-11-26 DIAGNOSIS — D751 Secondary polycythemia: Secondary | ICD-10-CM | POA: Diagnosis not present

## 2017-11-26 DIAGNOSIS — C7A00 Malignant carcinoid tumor of unspecified site: Secondary | ICD-10-CM | POA: Insufficient documentation

## 2017-11-26 DIAGNOSIS — Z806 Family history of leukemia: Secondary | ICD-10-CM | POA: Diagnosis not present

## 2017-11-26 DIAGNOSIS — Z87891 Personal history of nicotine dependence: Secondary | ICD-10-CM | POA: Diagnosis not present

## 2017-11-26 DIAGNOSIS — I110 Hypertensive heart disease with heart failure: Secondary | ICD-10-CM | POA: Diagnosis not present

## 2017-11-26 DIAGNOSIS — D3A098 Benign carcinoid tumors of other sites: Secondary | ICD-10-CM

## 2017-11-26 NOTE — Patient Instructions (Addendum)
Continue weighing daily and call for an overnight weight gain of > 2 pounds or a weekly weight gain of >5 pounds.  Make sure your potassium dosage is 4meq twice daily.

## 2017-11-26 NOTE — Progress Notes (Signed)
Patient ID: Veronica Wang, female    DOB: 1936-02-04, 82 y.o.   MRN: [id number]  HPI  Ms Smucker is a 82 y/o female with a history of liver, stomach and intestinal cancer, hyperlipidemia, HTN, GERD, COPD, atrial fibrillation, macular degeneration, previous tobacco use and chronic heart failure.   Echo report 04/17/17 reviewed and showed an EF of 50% along with mild AR/MR/TR.  Was in the ED 09/15/17 due to diarrhea where she was treated and released.   She presents today for a follow-up visit although hasn't been seen since 2015. She presents with a chief complaint of moderate shortness of breath upon minimal exertion. She says that this has been chronic in nature having been present for several years. She has associated fatigue, intermittent chest pain, diarrhea (due to intestinal cancer), light-headedness and difficulty sleeping along with this. She denies any abdominal distention, palpitations, pedal edema or weight gain. Has had to increase her potassium dosage due to recent low potassium level.   Past Medical History:  Diagnosis Date  . Abnormal MRI 09/10/2015  . Atrial fibrillation ([place])   . Cancer ([place])    Liver  . Cancer ([place])    stomach  . Carcinoid tumor of intestine   . CHF (congestive heart failure) ([place])   . COPD (chronic obstructive pulmonary disease) ([place])   . FH: chemotherapy   . GERD (gastroesophageal reflux disease)   . Hyperlipidemia   . Hypertension   . Macular degeneration   . Polycythemia   . Radiation    Past Surgical History:  Procedure Laterality Date  . CATARACT EXTRACTION  splenectomy  . COLON RESECTION    . COLON SURGERY    . GASTRECTOMY    . SPLENECTOMY     Family History  Problem Relation Age of Onset  . Heart attack Sister   . Heart attack Brother   . Leukemia Mother    Social History   Tobacco Use  . Smoking status: Former Smoker    Types: Cigarettes  . Smokeless tobacco: Never Used  Substance Use Topics  . Alcohol use: No   Alcohol/week: 0.0 oz   Allergies  Allergen Reactions  . No Known Allergies    Prior to Admission medications   Medication Sig Start Date End Date Taking? Authorizing Provider  acetaminophen (TYLENOL) 325 MG tablet Take 2 tablets (650 mg total) by mouth every 6 (six) hours as needed for mild pain (or Fever >/= 101). 12/15/14  Yes Tama High III, MD  albuterol (PROVENTIL HFA;VENTOLIN HFA) 108 (90 BASE) MCG/ACT inhaler Inhale 2 puffs into the lungs every 6 (six) hours as needed for wheezing or shortness of breath.   Yes [provider]  aspirin EC 81 MG tablet Take 81 mg by mouth daily.    Yes [provider]  Cholecalciferol (VITAMIN D-1000 MAX ST) 1000 UNITS tablet Take 1,000 Units by mouth daily.    Yes [provider]  Cyanocobalamin (VITAMIN B-12 CR) 1000 MCG TBCR Take 1 tablet by mouth daily.  03/02/11  Yes [provider]  famotidine (PEPCID) 20 MG tablet Take by mouth.   Yes [provider]  furosemide (LASIX) 20 MG tablet Take 20 mg by mouth daily.  02/22/15  Yes [provider]  loperamide (IMODIUM A-D) 2 MG tablet Take 4 mg by mouth 4 (four) times daily as needed for diarrhea or loose stools.   Yes [provider]  Multiple Vitamins-Minerals (CENTRUM SILVER) tablet Take 1 tablet by mouth daily.  03/02/11  Yes [provider]  ondansetron (ZOFRAN) 8 MG tablet Take 1 tablet (8 mg total) by mouth every 8 (eight) hours as needed for nausea or vomiting. 09/17/17  Yes Burns, Wandra Feinstein, NP  potassium chloride SA (K-DUR,KLOR-CON) 20 MEQ tablet Take 2 tablets (40 mEq total) by mouth 2 (two) times daily. 11/23/17  Yes Lloyd Huger, MD  prochlorperazine (COMPAZINE) 10 MG tablet Take 1 tablet (10 mg total) by mouth every 6 (six) hours as needed for nausea or vomiting. 09/17/17  Yes Jacquelin Hawking, NP  traMADol (ULTRAM) 50 MG tablet Take 0.5 tablets (25 mg total) by mouth every 6 (six) hours as needed. 09/17/17  Yes Jacquelin Hawking, NP    Review of Systems  Constitutional: Positive for fatigue. Negative for appetite change.  HENT: Negative for congestion, postnasal drip and sore throat.   Eyes: Negative.   Respiratory: Positive for shortness of breath (more so the last few weeks). Negative for chest tightness.   Cardiovascular: Positive for chest pain (at times). Negative for palpitations and leg swelling.  Gastrointestinal: Positive for diarrhea (due to intestional cancer). Negative for abdominal distention.  Endocrine: Negative.   Genitourinary: Negative.   Musculoskeletal: Negative for back pain and neck pain.  Skin: Negative.   Allergic/Immunologic: Negative.   Neurological: Positive for light-headedness (at times). Negative for dizziness.  Hematological: Negative for adenopathy. Does not bruise/bleed easily.  Psychiatric/Behavioral: Positive for sleep disturbance (some nights sleeping ok). Negative for dysphoric mood. The patient is not nervous/anxious.     Vitals:   11/26/17 1329  BP: 123/65  Pulse: 85  Resp: 18  SpO2: 95%  Weight: 69 lb 6 oz (31.5 kg)  Height: 5' (1.524 m)   Wt Readings from Last 3 Encounters:  11/26/17 69 lb 6 oz (31.5 kg)  10/26/17 68 lb 5.5 oz (31 kg)  09/17/17 68 lb 9.6 oz (31.1 kg)   Lab Results  Component Value Date   CREATININE 0.78 11/23/2017   CREATININE 0.70 10/26/2017   CREATININE 0.57 09/28/2017    Physical Exam  Constitutional: She is oriented to person, place, and time. She appears well-developed. She appears cachectic.  HENT:  Head: Normocephalic and atraumatic.  Neck: Normal range of motion. Neck supple. No JVD present.  Cardiovascular: Normal rate and regular rhythm.  Pulmonary/Chest: Effort normal. No respiratory distress. She has no wheezes. She has no rales.  Abdominal: Soft. She exhibits no distension.  Musculoskeletal:       Right lower leg: She exhibits no tenderness and no edema.       Left lower leg: She exhibits no tenderness and no  edema.  Neurological: She is alert and oriented to person, place, and time.  Skin: Skin is warm and dry.  Psychiatric: She has a normal mood and affect. Her behavior is normal.  Nursing note and vitals reviewed.  Assessment & Plan:  1: Chronic heart failure with preserved ejection fraction- - NYHA class III - euvolemic today - weighing daily and says that her weight has been stable over the last few months. Reminded to call for an overnight weight gain of >2 pounds or a weekly weight gain of >5 pounds - not adding salt to her food and tries to keep daily sodium intake to 2000mg  daily - had pedal edema a few days ago when she forgot to take her diuretic. Edema has since resolved with the resumption of her diuretic - saw cardiology ([facility]) 11/07/17 - BMP 11/23/17 reviewed and showed sodium  142, potassium 2.4 and GFR >60 - is now taking 79meq potassium twice daily due to recent low potassium level. She says that she's been taking 1 tablet twice daily but she thinks her tablet dosage is 9meq. She will go home and confirm tablet dosage so that she can make sure she's taking 32meq twice daily - BNP 06/02/15 was 108.0  2: Atrial fibrillation- - rate controlled at this time - saw PCP Caryl Comes) 11/14/17  3: Carcinoid tumor of intestine- - saw oncologist Grayland Ormond) 10/26/17  Patient did not bring her medications nor a list. Each medication was verbally reviewed with the patient and she was encouraged to bring the bottles to every visit to confirm accuracy of list.  Return in 1 month or sooner for any questions/problems before then.

## 2017-11-27 DIAGNOSIS — I5042 Chronic combined systolic (congestive) and diastolic (congestive) heart failure: Secondary | ICD-10-CM | POA: Insufficient documentation

## 2017-11-27 DIAGNOSIS — I480 Paroxysmal atrial fibrillation: Secondary | ICD-10-CM | POA: Insufficient documentation

## 2017-11-27 LAB — CHROMOGRANIN A: CHROMOGRANIN A: 57 nmol/L — AB (ref 0–5)

## 2017-11-28 ENCOUNTER — Inpatient Hospital Stay: Payer: Medicare Other

## 2017-11-28 DIAGNOSIS — C7A029 Malignant carcinoid tumor of the large intestine, unspecified portion: Secondary | ICD-10-CM | POA: Diagnosis not present

## 2017-11-28 DIAGNOSIS — E876 Hypokalemia: Secondary | ICD-10-CM

## 2017-11-28 LAB — BASIC METABOLIC PANEL
Anion gap: 9 (ref 5–15)
BUN: 14 mg/dL (ref 6–20)
CHLORIDE: 103 mmol/L (ref 101–111)
CO2: 27 mmol/L (ref 22–32)
CREATININE: 0.78 mg/dL (ref 0.44–1.00)
Calcium: 9.3 mg/dL (ref 8.9–10.3)
GFR calc Af Amer: >60 mL/min (ref >60)
GFR calc non Af Amer: >60 mL/min (ref >60)
Glucose, Bld: 130 mg/dL — ABNORMAL HIGH (ref 65–99)
Potassium: 5.3 mmol/L — ABNORMAL HIGH (ref 3.5–5.1)
Sodium: 139 mmol/L (ref 135–145)

## 2017-12-21 ENCOUNTER — Inpatient Hospital Stay: Payer: Medicare Other | Attending: Oncology

## 2017-12-21 ENCOUNTER — Inpatient Hospital Stay: Payer: Medicare Other

## 2017-12-21 VITALS — BP 127/84 | HR 68 | Temp 93.6°F | Resp 16

## 2017-12-21 DIAGNOSIS — C7B02 Secondary carcinoid tumors of liver: Secondary | ICD-10-CM | POA: Insufficient documentation

## 2017-12-21 DIAGNOSIS — C7A029 Malignant carcinoid tumor of the large intestine, unspecified portion: Secondary | ICD-10-CM | POA: Diagnosis present

## 2017-12-21 DIAGNOSIS — R197 Diarrhea, unspecified: Secondary | ICD-10-CM | POA: Diagnosis present

## 2017-12-21 DIAGNOSIS — D3A098 Benign carcinoid tumors of other sites: Secondary | ICD-10-CM

## 2017-12-21 LAB — BASIC METABOLIC PANEL
ANION GAP: 12 (ref 5–15)
BUN: 16 mg/dL (ref 6–20)
CHLORIDE: 104 mmol/L (ref 101–111)
CO2: 24 mmol/L (ref 22–32)
CREATININE: 0.66 mg/dL (ref 0.44–1.00)
Calcium: 8.7 mg/dL — ABNORMAL LOW (ref 8.9–10.3)
GFR calc non Af Amer: >60 mL/min (ref >60)
Glucose, Bld: 117 mg/dL — ABNORMAL HIGH (ref 65–99)
Potassium: 3.6 mmol/L (ref 3.5–5.1)
Sodium: 140 mmol/L (ref 135–145)

## 2017-12-21 LAB — CBC WITH DIFFERENTIAL/PLATELET
BASOS PCT: 1 %
Basophils Absolute: 0.1 10*3/uL (ref 0–0.1)
EOS ABS: 0.2 10*3/uL (ref 0–0.7)
Eosinophils Relative: 2 %
HEMATOCRIT: 43.7 % (ref 35.0–47.0)
HEMOGLOBIN: 14.4 g/dL (ref 12.0–16.0)
LYMPHS ABS: 2.2 10*3/uL (ref 1.0–3.6)
Lymphocytes Relative: 26 %
MCH: 27.2 pg (ref 26.0–34.0)
MCHC: 33.1 g/dL (ref 32.0–36.0)
MCV: 82.1 fL (ref 80.0–100.0)
MONOS PCT: 7 %
Monocytes Absolute: 0.6 10*3/uL (ref 0.2–0.9)
NEUTROS PCT: 64 %
Neutro Abs: 5.4 10*3/uL (ref 1.4–6.5)
Platelets: 227 10*3/uL (ref 150–440)
RBC: 5.32 MIL/uL — ABNORMAL HIGH (ref 3.80–5.20)
RDW: 15.7 % — ABNORMAL HIGH (ref 11.5–14.5)
WBC: 8.5 10*3/uL (ref 3.6–11.0)

## 2017-12-21 MED ORDER — LANREOTIDE ACETATE 120 MG/0.5ML ~~LOC~~ SOLN
120.0000 mg | Freq: Once | SUBCUTANEOUS | Status: AC
  Administered 2017-12-21: 120 mg via SUBCUTANEOUS
  Filled 2017-12-21: qty 120

## 2017-12-25 LAB — CHROMOGRANIN A: Chromogranin A: 68 nmol/L — ABNORMAL HIGH (ref 0–5)

## 2018-01-03 ENCOUNTER — Ambulatory Visit: Payer: Medicare Other | Attending: Family | Admitting: Family

## 2018-01-03 ENCOUNTER — Encounter: Payer: Self-pay | Admitting: Family

## 2018-01-03 VITALS — BP 136/72 | HR 63 | Resp 18 | Ht <= 58 in | Wt <= 1120 oz

## 2018-01-03 DIAGNOSIS — Z8249 Family history of ischemic heart disease and other diseases of the circulatory system: Secondary | ICD-10-CM | POA: Insufficient documentation

## 2018-01-03 DIAGNOSIS — I110 Hypertensive heart disease with heart failure: Secondary | ICD-10-CM | POA: Diagnosis present

## 2018-01-03 DIAGNOSIS — Z8505 Personal history of malignant neoplasm of liver: Secondary | ICD-10-CM | POA: Diagnosis not present

## 2018-01-03 DIAGNOSIS — Z806 Family history of leukemia: Secondary | ICD-10-CM | POA: Diagnosis not present

## 2018-01-03 DIAGNOSIS — I4891 Unspecified atrial fibrillation: Secondary | ICD-10-CM | POA: Diagnosis not present

## 2018-01-03 DIAGNOSIS — K219 Gastro-esophageal reflux disease without esophagitis: Secondary | ICD-10-CM | POA: Insufficient documentation

## 2018-01-03 DIAGNOSIS — Z9889 Other specified postprocedural states: Secondary | ICD-10-CM | POA: Diagnosis not present

## 2018-01-03 DIAGNOSIS — Z85028 Personal history of other malignant neoplasm of stomach: Secondary | ICD-10-CM | POA: Insufficient documentation

## 2018-01-03 DIAGNOSIS — Z9081 Acquired absence of spleen: Secondary | ICD-10-CM | POA: Diagnosis not present

## 2018-01-03 DIAGNOSIS — I5032 Chronic diastolic (congestive) heart failure: Secondary | ICD-10-CM

## 2018-01-03 DIAGNOSIS — Z79891 Long term (current) use of opiate analgesic: Secondary | ICD-10-CM | POA: Diagnosis not present

## 2018-01-03 DIAGNOSIS — Z79899 Other long term (current) drug therapy: Secondary | ICD-10-CM | POA: Diagnosis not present

## 2018-01-03 DIAGNOSIS — D3A098 Benign carcinoid tumors of other sites: Secondary | ICD-10-CM

## 2018-01-03 DIAGNOSIS — Z7982 Long term (current) use of aspirin: Secondary | ICD-10-CM | POA: Diagnosis not present

## 2018-01-03 DIAGNOSIS — C7B09 Secondary carcinoid tumors of other sites: Secondary | ICD-10-CM | POA: Insufficient documentation

## 2018-01-03 DIAGNOSIS — J449 Chronic obstructive pulmonary disease, unspecified: Secondary | ICD-10-CM | POA: Insufficient documentation

## 2018-01-03 DIAGNOSIS — E785 Hyperlipidemia, unspecified: Secondary | ICD-10-CM | POA: Diagnosis not present

## 2018-01-03 DIAGNOSIS — Z87891 Personal history of nicotine dependence: Secondary | ICD-10-CM | POA: Diagnosis not present

## 2018-01-03 DIAGNOSIS — I480 Paroxysmal atrial fibrillation: Secondary | ICD-10-CM

## 2018-01-03 NOTE — Progress Notes (Signed)
Patient ID: Veronica Wang, female    DOB: [birth year], 82 y.o.   MRN: [id number]  HPI  Veronica Wang is a 82 y/o female with a history of liver, stomach and intestinal cancer, hyperlipidemia, HTN, GERD, COPD, atrial fibrillation, macular degeneration, previous tobacco use and chronic heart failure.   Echo report 04/17/17 reviewed and showed an EF of 50% along with mild AR/MR/TR.  Was in the ED 09/15/17 due to diarrhea where she was treated and released.   She presents today for a follow-up visit with a chief complaint of minimal shortness of breath upon moderate exertion. She describes this as chronic in nature having been present for several years. She has associated fatigue and chronic diarrhea along with this. She denies any difficulty sleeping, abdominal distention, palpitations, pedal edema, chest pain, dizziness or weight gain. Has been drinking BOOST and extra protein to try and help with weight loss.   Past Medical History:  Diagnosis Date  . Abnormal MRI 09/10/2015  . Atrial fibrillation ([place])   . Cancer ([place])    Liver  . Cancer ([place])    stomach  . Carcinoid tumor of intestine   . CHF (congestive heart failure) ([place])   . COPD (chronic obstructive pulmonary disease) ([place])   . FH: chemotherapy   . GERD (gastroesophageal reflux disease)   . Hyperlipidemia   . Hypertension   . Macular degeneration   . Polycythemia   . Radiation    Past Surgical History:  Procedure Laterality Date  . CATARACT EXTRACTION  splenectomy  . COLON RESECTION    . COLON SURGERY    . GASTRECTOMY    . SPLENECTOMY     Family History  Problem Relation Age of Onset  . Heart attack Sister   . Heart attack Brother   . Leukemia Mother    Social History   Tobacco Use  . Smoking status: Former Smoker    Types: Cigarettes  . Smokeless tobacco: Never Used  Substance Use Topics  . Alcohol use: No    Alcohol/week: 0.0 oz   Allergies  Allergen Reactions  . No Known Allergies    Prior to Admission  medications   Medication Sig Start Date End Date Taking? Authorizing Provider  acetaminophen (TYLENOL) 325 MG tablet Take 2 tablets (650 mg total) by mouth every 6 (six) hours as needed for mild pain (or Fever >/= 101). 12/15/14  Yes Tama High III, MD  albuterol (PROVENTIL HFA;VENTOLIN HFA) 108 (90 BASE) MCG/ACT inhaler Inhale 2 puffs into the lungs every 6 (six) hours as needed for wheezing or shortness of breath.   Yes [provider]  aspirin EC 81 MG tablet Take 81 mg by mouth daily.    Yes [provider]  Cholecalciferol (VITAMIN D-1000 MAX ST) 1000 UNITS tablet Take 1,000 Units by mouth daily.    Yes [provider]  Cyanocobalamin (VITAMIN B-12 CR) 1000 MCG TBCR Take 1 tablet by mouth daily.  03/02/11  Yes [provider]  famotidine (PEPCID) 20 MG tablet Take by mouth.   Yes [provider]  feeding supplement (BOOST HIGH PROTEIN) LIQD Take 1 Container by mouth 3 (three) times daily between meals.   Yes [provider]  furosemide (LASIX) 20 MG tablet Take 20 mg by mouth daily.  02/22/15  Yes [provider]  loperamide (IMODIUM A-D) 2 MG tablet Take 4 mg by mouth 4 (four) times daily as needed for diarrhea or loose stools.   Yes [provider]  Multiple Vitamins-Minerals (CENTRUM SILVER) tablet Take 1 tablet by mouth daily.  03/02/11  Yes [provider]  ondansetron (ZOFRAN) 8 MG tablet Take 1 tablet (8 mg total) by mouth every 8 (eight) hours as needed for nausea or vomiting. 09/17/17  Yes Burns, Wandra Feinstein, NP  prochlorperazine (COMPAZINE) 10 MG tablet Take 1 tablet (10 mg total) by mouth every 6 (six) hours as needed for nausea or vomiting. 09/17/17  Yes Jacquelin Hawking, NP  traMADol (ULTRAM) 50 MG tablet Take 0.5 tablets (25 mg total) by mouth every 6 (six) hours as needed. 09/17/17  Yes Burns, Wandra Feinstein, NP  potassium chloride SA (K-DUR,KLOR-CON) 20 MEQ tablet Take 2 tablets (40 mEq total) by mouth 2 (two)  times daily. Patient not taking: Reported on 01/03/2018 11/23/17   Lloyd Huger, MD    Review of Systems  Constitutional: Positive for fatigue. Negative for appetite change.  HENT: Negative for congestion, postnasal drip and sore throat.   Eyes: Negative.   Respiratory: Positive for shortness of breath (minimal). Negative for chest tightness.   Cardiovascular: Negative for chest pain, palpitations and leg swelling.  Gastrointestinal: Positive for diarrhea (due to intestional cancer). Negative for abdominal distention.  Endocrine: Negative.   Genitourinary: Negative.   Musculoskeletal: Negative for back pain and neck pain.  Skin: Negative.   Allergic/Immunologic: Negative.   Neurological: Negative for dizziness and light-headedness.  Hematological: Negative for adenopathy. Does not bruise/bleed easily.  Psychiatric/Behavioral: Negative for dysphoric mood and sleep disturbance. The patient is not nervous/anxious.    Vitals:   01/03/18 1051  BP: 136/72  Pulse: 63  Resp: 18  SpO2: 100%  Weight: 68 lb 8 oz (31.1 kg)  Height: 4\' 9"  (1.448 m)   Wt Readings from Last 3 Encounters:  01/03/18 68 lb 8 oz (31.1 kg)  11/26/17 69 lb 6 oz (31.5 kg)  10/26/17 68 lb 5.5 oz (31 kg)   Lab Results  Component Value Date   CREATININE 0.66 12/21/2017   CREATININE 0.78 11/28/2017   CREATININE 0.78 11/23/2017    Physical Exam  Constitutional: She is oriented to person, place, and time. She appears well-developed. She appears cachectic.  HENT:  Head: Normocephalic and atraumatic.  Neck: Normal range of motion. Neck supple. No JVD present.  Cardiovascular: Normal rate and regular rhythm.  Pulmonary/Chest: Effort normal. No respiratory distress. She has no wheezes. She has no rales.  Abdominal: Soft. She exhibits no distension.  Musculoskeletal:       Right lower leg: She exhibits no tenderness and no edema.       Left lower leg: She exhibits no tenderness and no edema.  Neurological:  She is alert and oriented to person, place, and time.  Skin: Skin is warm and dry.  Psychiatric: She has a normal mood and affect. Her behavior is normal.  Nursing note and vitals reviewed.  Assessment & Plan:  1: Chronic heart failure with preserved ejection fraction- - NYHA class II - euvolemic today - weighing daily and says that her weight has been stable over the last few months. Reminded to call for an overnight weight gain of >2 pounds or a weekly weight gain of >5 pounds - weight stable from last time she was here - not adding salt to her food and tries to keep daily sodium intake to 2000mg  daily - saw cardiology ([facility]) 11/07/17 - BMP 12/21/17 reviewed and showed sodium 140, potassium 3.6 and GFR >60 - BNP 06/02/15 was 108.0  2:  Atrial fibrillation- - rate controlled at this time - saw PCP Caryl Comes) 11/14/17  3: Carcinoid tumor of intestine- - saw oncologist Grayland Ormond) 10/26/17  Patient did not bring her medications nor a list. Each medication was verbally reviewed with the patient and she was encouraged to bring the bottles to every visit to confirm accuracy of list.  Return in 4 months or sooner for any questions/problems before then.

## 2018-01-03 NOTE — Patient Instructions (Signed)
Continue weighing daily and call for an overnight weight gain of > 2 pounds or a weekly weight gain of >5 pounds.  Bring medication bottles to every visit 
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
## 2018-01-18 ENCOUNTER — Inpatient Hospital Stay: Payer: Medicare Other | Attending: Oncology | Admitting: Oncology

## 2018-01-18 ENCOUNTER — Encounter: Payer: Self-pay | Admitting: Oncology

## 2018-01-18 ENCOUNTER — Inpatient Hospital Stay: Payer: Medicare Other

## 2018-01-18 VITALS — BP 124/81 | HR 76 | Temp 98.1°F | Resp 16 | Wt <= 1120 oz

## 2018-01-18 DIAGNOSIS — D3A098 Benign carcinoid tumors of other sites: Secondary | ICD-10-CM

## 2018-01-18 DIAGNOSIS — C7A019 Malignant carcinoid tumor of the small intestine, unspecified portion: Secondary | ICD-10-CM | POA: Diagnosis present

## 2018-01-18 DIAGNOSIS — R197 Diarrhea, unspecified: Secondary | ICD-10-CM | POA: Diagnosis not present

## 2018-01-18 DIAGNOSIS — C7B02 Secondary carcinoid tumors of liver: Secondary | ICD-10-CM | POA: Insufficient documentation

## 2018-01-18 DIAGNOSIS — E876 Hypokalemia: Secondary | ICD-10-CM

## 2018-01-18 LAB — CBC WITH DIFFERENTIAL/PLATELET
BASOS ABS: 0.1 10*3/uL (ref 0–0.1)
BASOS PCT: 1 %
Eosinophils Absolute: 0.1 10*3/uL (ref 0–0.7)
Eosinophils Relative: 1 %
HEMATOCRIT: 48.6 % — AB (ref 35.0–47.0)
HEMOGLOBIN: 15.9 g/dL (ref 12.0–16.0)
Lymphocytes Relative: 28 %
Lymphs Abs: 2.7 10*3/uL (ref 1.0–3.6)
MCH: 26.8 pg (ref 26.0–34.0)
MCHC: 32.8 g/dL (ref 32.0–36.0)
MCV: 81.7 fL (ref 80.0–100.0)
Monocytes Absolute: 0.7 10*3/uL (ref 0.2–0.9)
Monocytes Relative: 7 %
NEUTROS ABS: 6 10*3/uL (ref 1.4–6.5)
NEUTROS PCT: 63 %
Platelets: 243 10*3/uL (ref 150–440)
RBC: 5.94 MIL/uL — AB (ref 3.80–5.20)
RDW: 16.1 % — ABNORMAL HIGH (ref 11.5–14.5)
WBC: 9.6 10*3/uL (ref 3.6–11.0)

## 2018-01-18 LAB — COMPREHENSIVE METABOLIC PANEL
ALBUMIN: 4.1 g/dL (ref 3.5–5.0)
ALT: 17 U/L (ref 0–44)
AST: 32 U/L (ref 15–41)
Alkaline Phosphatase: 113 U/L (ref 38–126)
Anion gap: 13 (ref 5–15)
BILIRUBIN TOTAL: 0.5 mg/dL (ref 0.3–1.2)
BUN: 19 mg/dL (ref 8–23)
CO2: 27 mmol/L (ref 22–32)
CREATININE: 0.71 mg/dL (ref 0.44–1.00)
Calcium: 9.2 mg/dL (ref 8.9–10.3)
Chloride: 103 mmol/L (ref 98–111)
GFR calc Af Amer: >60 mL/min (ref >60)
GFR calc non Af Amer: >60 mL/min (ref >60)
GLUCOSE: 107 mg/dL — AB (ref 70–99)
Potassium: 4.6 mmol/L (ref 3.5–5.1)
Sodium: 143 mmol/L (ref 135–145)
Total Protein: 8 g/dL (ref 6.5–8.1)

## 2018-01-18 MED ORDER — LANREOTIDE ACETATE 120 MG/0.5ML ~~LOC~~ SOLN
120.0000 mg | Freq: Once | SUBCUTANEOUS | Status: AC
  Administered 2018-01-18: 120 mg via SUBCUTANEOUS
  Filled 2018-01-18: qty 120

## 2018-01-18 NOTE — Progress Notes (Signed)
Pt denies any concerns or difficulties today.

## 2018-01-22 LAB — CHROMOGRANIN A: Chromogranin A: 78 nmol/L — ABNORMAL HIGH (ref 0–5)

## 2018-01-25 ENCOUNTER — Inpatient Hospital Stay: Payer: Medicare Other

## 2018-01-25 DIAGNOSIS — C7A019 Malignant carcinoid tumor of the small intestine, unspecified portion: Secondary | ICD-10-CM | POA: Diagnosis not present

## 2018-01-25 DIAGNOSIS — D3A098 Benign carcinoid tumors of other sites: Secondary | ICD-10-CM

## 2018-01-29 LAB — 5 HIAA, QUANTITATIVE, URINE, 24 HOUR
5-HIAA, Ur: 50.6 mg/L
5-HIAA,Quant.,24 Hr Urine: 38 mg/24 hr — ABNORMAL HIGH (ref 0.0–14.9)
Total Volume: 750

## 2018-02-11 ENCOUNTER — Other Ambulatory Visit: Payer: Self-pay | Admitting: Oncology

## 2018-02-11 DIAGNOSIS — D3A098 Benign carcinoid tumors of other sites: Secondary | ICD-10-CM

## 2018-02-15 ENCOUNTER — Inpatient Hospital Stay: Payer: Medicare Other | Attending: Oncology

## 2018-02-15 ENCOUNTER — Inpatient Hospital Stay: Payer: Medicare Other

## 2018-02-15 VITALS — BP 141/89 | HR 74 | Temp 95.6°F | Resp 18

## 2018-02-15 DIAGNOSIS — C7A029 Malignant carcinoid tumor of the large intestine, unspecified portion: Secondary | ICD-10-CM | POA: Diagnosis present

## 2018-02-15 DIAGNOSIS — D3A098 Benign carcinoid tumors of other sites: Secondary | ICD-10-CM

## 2018-02-15 DIAGNOSIS — C7B02 Secondary carcinoid tumors of liver: Secondary | ICD-10-CM | POA: Diagnosis not present

## 2018-02-15 LAB — CBC WITH DIFFERENTIAL/PLATELET
BASOS ABS: 0.1 10*3/uL (ref 0–0.1)
BASOS PCT: 1 %
EOS ABS: 0.2 10*3/uL (ref 0–0.7)
EOS PCT: 2 %
HCT: 45.3 % (ref 35.0–47.0)
Hemoglobin: 15.1 g/dL (ref 12.0–16.0)
Lymphocytes Relative: 28 %
Lymphs Abs: 2.6 10*3/uL (ref 1.0–3.6)
MCH: 27 pg (ref 26.0–34.0)
MCHC: 33.4 g/dL (ref 32.0–36.0)
MCV: 81 fL (ref 80.0–100.0)
MONO ABS: 0.8 10*3/uL (ref 0.2–0.9)
Monocytes Relative: 9 %
Neutro Abs: 5.8 10*3/uL (ref 1.4–6.5)
Neutrophils Relative %: 60 %
PLATELETS: 236 10*3/uL (ref 150–440)
RBC: 5.59 MIL/uL — ABNORMAL HIGH (ref 3.80–5.20)
RDW: 15.8 % — ABNORMAL HIGH (ref 11.5–14.5)
WBC: 9.5 10*3/uL (ref 3.6–11.0)

## 2018-02-15 MED ORDER — LANREOTIDE ACETATE 120 MG/0.5ML ~~LOC~~ SOLN
120.0000 mg | Freq: Once | SUBCUTANEOUS | Status: AC
  Administered 2018-02-15: 120 mg via SUBCUTANEOUS
  Filled 2018-02-15: qty 120

## 2018-02-19 LAB — CHROMOGRANIN A: Chromogranin A: 79 nmol/L — ABNORMAL HIGH (ref 0–5)

## 2018-03-15 ENCOUNTER — Inpatient Hospital Stay: Payer: Medicare Other | Attending: Oncology

## 2018-03-15 VITALS — BP 148/83 | HR 86 | Resp 18

## 2018-03-15 DIAGNOSIS — C7A029 Malignant carcinoid tumor of the large intestine, unspecified portion: Secondary | ICD-10-CM | POA: Insufficient documentation

## 2018-03-15 DIAGNOSIS — D3A098 Benign carcinoid tumors of other sites: Secondary | ICD-10-CM

## 2018-03-15 DIAGNOSIS — C7B02 Secondary carcinoid tumors of liver: Secondary | ICD-10-CM | POA: Insufficient documentation

## 2018-03-15 LAB — COMPREHENSIVE METABOLIC PANEL
ALK PHOS: 113 U/L (ref 38–126)
ALT: 15 U/L (ref 0–44)
ANION GAP: 11 (ref 5–15)
AST: 29 U/L (ref 15–41)
Albumin: 3.6 g/dL (ref 3.5–5.0)
BILIRUBIN TOTAL: 0.5 mg/dL (ref 0.3–1.2)
BUN: 11 mg/dL (ref 8–23)
CALCIUM: 8.7 mg/dL — AB (ref 8.9–10.3)
CO2: 27 mmol/L (ref 22–32)
Chloride: 105 mmol/L (ref 98–111)
Creatinine, Ser: 0.7 mg/dL (ref 0.44–1.00)
GFR calc non Af Amer: >60 mL/min (ref >60)
Glucose, Bld: 86 mg/dL (ref 70–99)
Potassium: 2.9 mmol/L — ABNORMAL LOW (ref 3.5–5.1)
Sodium: 143 mmol/L (ref 135–145)
TOTAL PROTEIN: 7.2 g/dL (ref 6.5–8.1)

## 2018-03-15 LAB — CBC WITH DIFFERENTIAL/PLATELET
BASOS ABS: 0.1 10*3/uL (ref 0–0.1)
BASOS PCT: 1 %
Eosinophils Absolute: 0.1 10*3/uL (ref 0–0.7)
Eosinophils Relative: 1 %
HEMATOCRIT: 46.2 % (ref 35.0–47.0)
HEMOGLOBIN: 15.1 g/dL (ref 12.0–16.0)
Lymphocytes Relative: 23 %
Lymphs Abs: 2.3 10*3/uL (ref 1.0–3.6)
MCH: 26.7 pg (ref 26.0–34.0)
MCHC: 32.6 g/dL (ref 32.0–36.0)
MCV: 81.9 fL (ref 80.0–100.0)
Monocytes Absolute: 0.9 10*3/uL (ref 0.2–0.9)
Monocytes Relative: 9 %
NEUTROS ABS: 6.6 10*3/uL — AB (ref 1.4–6.5)
NEUTROS PCT: 66 %
Platelets: 261 10*3/uL (ref 150–440)
RBC: 5.65 MIL/uL — ABNORMAL HIGH (ref 3.80–5.20)
RDW: 15.8 % — AB (ref 11.5–14.5)
WBC: 10 10*3/uL (ref 3.6–11.0)

## 2018-03-15 MED ORDER — LANREOTIDE ACETATE 120 MG/0.5ML ~~LOC~~ SOLN
120.0000 mg | Freq: Once | SUBCUTANEOUS | Status: AC
  Administered 2018-03-15: 120 mg via SUBCUTANEOUS
  Filled 2018-03-15: qty 120

## 2018-03-15 NOTE — Patient Instructions (Signed)
Lanreotide injection What is this medicine? LANREOTIDE (lan REE oh tide) is used to reduce blood levels of growth hormone in patients with a condition called acromegaly. It also works to slow or stop tumor growth in patients with neuroendocrine tumors and treat carcinoid syndrome. This medicine may be used for other purposes; ask your health care provider or pharmacist if you have questions. COMMON BRAND NAME(S): Somatuline Depot What should I tell my health care provider before I take this medicine? They need to know if you have any of these conditions: -diabetes -gallbladder disease -heart disease -kidney disease -liver disease -thyroid disease -an unusual or allergic reaction to lanreotide, other medicines, foods, dyes, or preservatives -pregnant or trying to get pregnant -breast-feeding How should I use this medicine? This medicine is for injection under the skin. It is given by a health care professional in a hospital or clinic setting. Contact your pediatrician or health care professional regarding the use of this medicine in children. Special care may be needed. Overdosage: If you think you have taken too much of this medicine contact a poison control center or emergency room at once. NOTE: This medicine is only for you. Do not share this medicine with others. What if I miss a dose? It is important not to miss your dose. Call your doctor or health care professional if you are unable to keep an appointment. What may interact with this medicine? This medicine may interact with the following medications: -bromocriptine -cyclosporine -certain medicines for blood pressure, heart disease, irregular heart beat -certain medicines for diabetes -quinidine -terfenadine This list may not describe all possible interactions. Give your health care provider a list of all the medicines, herbs, non-prescription drugs, or dietary supplements you use. Also tell them if you smoke, drink alcohol, or  use illegal drugs. Some items may interact with your medicine. What should I watch for while using this medicine? Tell your doctor or healthcare professional if your symptoms do not start to get better or if they get worse. Visit your doctor or health care professional for regular checks on your progress. Your condition will be monitored carefully while you are receiving this medicine. You may need blood work done while you are taking this medicine. Women should inform their doctor if they wish to become pregnant or think they might be pregnant. There is a potential for serious side effects to an unborn child. Talk to your health care professional or pharmacist for more information. Do not breast-feed an infant while taking this medicine or for 6 months after stopping it. This medicine has caused ovarian failure in some women. This medicine may interfere with the ability to have a child. Talk with your doctor or health care professional if you are concerned about your fertility. What side effects may I notice from receiving this medicine? Side effects that you should report to your doctor or health care professional as soon as possible: -allergic reactions like skin rash, itching or hives, swelling of the face, lips, or tongue -increased blood pressure -severe stomach pain -signs and symptoms of high blood sugar such as dizziness; dry mouth; dry skin; fruity breath; nausea; stomach pain; increased hunger or thirst; increased urination -signs and symptoms of low blood sugar such as feeling anxious; confusion; dizziness; increased hunger; unusually weak or tired; sweating; shakiness; cold; irritable; headache; blurred vision; fast heartbeat; loss of consciousness -unusually slow heartbeat Side effects that usually do not require medical attention (report to your doctor or health care professional if they continue   or are bothersome): -constipation -diarrhea -dizziness -headache -muscle pain -muscle  spasms -nausea -pain, redness, or irritation at site where injected This list may not describe all possible side effects. Call your doctor for medical advice about side effects. You may report side effects to FDA at 1-800-FDA-1088. Where should I keep my medicine? This drug is given in a hospital or clinic and will not be stored at home. NOTE: This sheet is a summary. It may not cover all possible information. If you have questions about this medicine, talk to your doctor, pharmacist, or health care provider.  2018 Elsevier/Gold Standard (2016-03-31 10:33:47)  

## 2018-03-19 LAB — CHROMOGRANIN A: CHROMOGRANIN A: 96 nmol/L — AB (ref 0–5)
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
## 2018-04-09 ENCOUNTER — Ambulatory Visit
Admission: RE | Admit: 2018-04-09 | Discharge: 2018-04-09 | Disposition: A | Discharge status: Home / Self Care | Payer: Medicare Other | Source: Ambulatory Visit | Attending: Oncology | Admitting: Oncology

## 2018-04-09 DIAGNOSIS — D3A098 Benign carcinoid tumors of other sites: Secondary | ICD-10-CM | POA: Diagnosis present

## 2018-04-09 DIAGNOSIS — C7951 Secondary malignant neoplasm of bone: Secondary | ICD-10-CM | POA: Diagnosis not present

## 2018-04-09 DIAGNOSIS — C787 Secondary malignant neoplasm of liver and intrahepatic bile duct: Secondary | ICD-10-CM | POA: Diagnosis not present

## 2018-04-09 MED ORDER — GADOBENATE DIMEGLUMINE 529 MG/ML IV SOLN
10.0000 mL | Freq: Once | INTRAVENOUS | Status: AC | PRN
  Administered 2018-04-09: 5 mL via INTRAVENOUS

## 2018-04-12 ENCOUNTER — Inpatient Hospital Stay: Payer: Medicare Other | Attending: Oncology | Admitting: Oncology

## 2018-04-12 ENCOUNTER — Other Ambulatory Visit: Payer: Medicare Other

## 2018-04-12 ENCOUNTER — Ambulatory Visit: Payer: Medicare Other

## 2018-04-12 ENCOUNTER — Inpatient Hospital Stay: Payer: Medicare Other

## 2018-04-12 ENCOUNTER — Ambulatory Visit: Payer: Medicare Other | Admitting: Oncology

## 2018-04-12 VITALS — BP 162/62 | HR 85 | Temp 96.7°F | Resp 18 | Wt <= 1120 oz

## 2018-04-12 DIAGNOSIS — C7A029 Malignant carcinoid tumor of the large intestine, unspecified portion: Secondary | ICD-10-CM | POA: Insufficient documentation

## 2018-04-12 DIAGNOSIS — D3A098 Benign carcinoid tumors of other sites: Secondary | ICD-10-CM

## 2018-04-12 DIAGNOSIS — C7B02 Secondary carcinoid tumors of liver: Secondary | ICD-10-CM | POA: Diagnosis not present

## 2018-04-12 LAB — COMPREHENSIVE METABOLIC PANEL
ALT: 14 U/L (ref 0–44)
AST: 29 U/L (ref 15–41)
Albumin: 3.6 g/dL (ref 3.5–5.0)
Alkaline Phosphatase: 100 U/L (ref 38–126)
Anion gap: 9 (ref 5–15)
BILIRUBIN TOTAL: 0.7 mg/dL (ref 0.3–1.2)
BUN: 14 mg/dL (ref 8–23)
CO2: 28 mmol/L (ref 22–32)
CREATININE: 0.66 mg/dL (ref 0.44–1.00)
Calcium: 8.9 mg/dL (ref 8.9–10.3)
Chloride: 104 mmol/L (ref 98–111)
GFR calc Af Amer: >60 mL/min (ref >60)
Glucose, Bld: 106 mg/dL — ABNORMAL HIGH (ref 70–99)
POTASSIUM: 3.5 mmol/L (ref 3.5–5.1)
Sodium: 141 mmol/L (ref 135–145)
Total Protein: 7.1 g/dL (ref 6.5–8.1)

## 2018-04-12 LAB — CBC WITH DIFFERENTIAL/PLATELET
BASOS PCT: 1 %
Basophils Absolute: 0.1 10*3/uL (ref 0–0.1)
EOS ABS: 0.1 10*3/uL (ref 0–0.7)
EOS PCT: 2 %
HCT: 44.4 % (ref 35.0–47.0)
Hemoglobin: 14.8 g/dL (ref 12.0–16.0)
LYMPHS PCT: 22 %
Lymphs Abs: 1.8 10*3/uL (ref 1.0–3.6)
MCH: 27.2 pg (ref 26.0–34.0)
MCHC: 33.2 g/dL (ref 32.0–36.0)
MCV: 81.9 fL (ref 80.0–100.0)
MONO ABS: 0.6 10*3/uL (ref 0.2–0.9)
Monocytes Relative: 8 %
Neutro Abs: 5.7 10*3/uL (ref 1.4–6.5)
Neutrophils Relative %: 67 %
PLATELETS: 223 10*3/uL (ref 150–440)
RBC: 5.42 MIL/uL — AB (ref 3.80–5.20)
RDW: 16 % — AB (ref 11.5–14.5)
WBC: 8.3 10*3/uL (ref 3.6–11.0)

## 2018-04-12 MED ORDER — LANREOTIDE ACETATE 120 MG/0.5ML ~~LOC~~ SOLN
120.0000 mg | Freq: Once | SUBCUTANEOUS | Status: AC
  Administered 2018-04-12: 120 mg via SUBCUTANEOUS
  Filled 2018-04-12: qty 120

## 2018-04-12 NOTE — Progress Notes (Signed)
Patient denies any concerns today.  

## 2018-04-16 LAB — CHROMOGRANIN A: CHROMOGRANIN A: 82 nmol/L — AB (ref 0–5)

## 2018-04-25 ENCOUNTER — Ambulatory Visit: Payer: Medicare Other | Attending: Family | Admitting: Family

## 2018-04-25 ENCOUNTER — Encounter: Payer: Self-pay | Admitting: Family

## 2018-04-25 VITALS — BP 139/85 | HR 86 | Resp 18 | Ht <= 58 in | Wt <= 1120 oz

## 2018-04-25 DIAGNOSIS — D3A098 Benign carcinoid tumors of other sites: Secondary | ICD-10-CM

## 2018-04-25 DIAGNOSIS — K219 Gastro-esophageal reflux disease without esophagitis: Secondary | ICD-10-CM | POA: Diagnosis not present

## 2018-04-25 DIAGNOSIS — I4891 Unspecified atrial fibrillation: Secondary | ICD-10-CM | POA: Diagnosis not present

## 2018-04-25 DIAGNOSIS — Z85028 Personal history of other malignant neoplasm of stomach: Secondary | ICD-10-CM | POA: Diagnosis not present

## 2018-04-25 DIAGNOSIS — J449 Chronic obstructive pulmonary disease, unspecified: Secondary | ICD-10-CM | POA: Insufficient documentation

## 2018-04-25 DIAGNOSIS — I5032 Chronic diastolic (congestive) heart failure: Secondary | ICD-10-CM | POA: Insufficient documentation

## 2018-04-25 DIAGNOSIS — Z8505 Personal history of malignant neoplasm of liver: Secondary | ICD-10-CM | POA: Diagnosis not present

## 2018-04-25 DIAGNOSIS — H3530 Unspecified macular degeneration: Secondary | ICD-10-CM | POA: Insufficient documentation

## 2018-04-25 DIAGNOSIS — Z806 Family history of leukemia: Secondary | ICD-10-CM | POA: Diagnosis not present

## 2018-04-25 DIAGNOSIS — E785 Hyperlipidemia, unspecified: Secondary | ICD-10-CM | POA: Insufficient documentation

## 2018-04-25 DIAGNOSIS — Z87891 Personal history of nicotine dependence: Secondary | ICD-10-CM | POA: Diagnosis not present

## 2018-04-25 DIAGNOSIS — I110 Hypertensive heart disease with heart failure: Secondary | ICD-10-CM | POA: Diagnosis not present

## 2018-04-25 DIAGNOSIS — R0602 Shortness of breath: Secondary | ICD-10-CM | POA: Diagnosis present

## 2018-04-25 DIAGNOSIS — Z7982 Long term (current) use of aspirin: Secondary | ICD-10-CM | POA: Diagnosis not present

## 2018-04-25 DIAGNOSIS — Z79899 Other long term (current) drug therapy: Secondary | ICD-10-CM | POA: Insufficient documentation

## 2018-04-25 DIAGNOSIS — Z8249 Family history of ischemic heart disease and other diseases of the circulatory system: Secondary | ICD-10-CM | POA: Insufficient documentation

## 2018-04-25 NOTE — Progress Notes (Signed)
Patient ID: Veronica Wang, female    DOB: 02-28-1936, 82 y.o.   MRN: [id number]  HPI  Veronica Wang is a 82 y/o female with a history of liver, stomach and intestinal cancer, hyperlipidemia, HTN, GERD, COPD, atrial fibrillation, macular degeneration, previous tobacco use and chronic heart failure.   Echo report 04/17/17 reviewed and showed an EF of 50% along with mild AR/MR/TR.  Has not been admitted or been in the ED in the last 6 months.  She presents today for a follow-up visit with a chief complaint of minimal shortness of breath upon moderate exertion. She describes this as chronic in nature having been present for several years. She has associated fatigue and chronic diarrhea along with this. She denies any difficulty sleeping, abdominal distention, palpitations, pedal edema, chest pain, dizziness or weight gain.   Past Medical History:  Diagnosis Date  . Abnormal MRI 09/10/2015  . Atrial fibrillation ([place])   . Cancer ([place])    Liver  . Cancer ([place])    stomach  . Carcinoid tumor of intestine   . CHF (congestive heart failure) ([place])   . COPD (chronic obstructive pulmonary disease) ([place])   . FH: chemotherapy   . GERD (gastroesophageal reflux disease)   . Hyperlipidemia   . Hypertension   . Macular degeneration   . Polycythemia   . Radiation    Past Surgical History:  Procedure Laterality Date  . CATARACT EXTRACTION  splenectomy  . COLON RESECTION    . COLON SURGERY    . GASTRECTOMY    . SPLENECTOMY     Family History  Problem Relation Age of Onset  . Heart attack Sister   . Heart attack Brother   . Leukemia Mother    Social History   Tobacco Use  . Smoking status: Former Smoker    Types: Cigarettes  . Smokeless tobacco: Never Used  Substance Use Topics  . Alcohol use: No    Alcohol/week: 0.0 standard drinks   Allergies  Allergen Reactions  . No Known Allergies    Prior to Admission medications   Medication Sig Start Date End Date Taking? Authorizing  Provider  acetaminophen (TYLENOL) 325 MG tablet Take 2 tablets (650 mg total) by mouth every 6 (six) hours as needed for mild pain (or Fever >/= 101). 12/15/14  Yes Tama High III, MD  aspirin EC 81 MG tablet Take 81 mg by mouth daily.    Yes [provider]  Cholecalciferol (VITAMIN D-1000 MAX ST) 1000 UNITS tablet Take 1,000 Units by mouth daily.    Yes [provider]  Cyanocobalamin (VITAMIN B-12 CR) 1000 MCG TBCR Take 1 tablet by mouth daily.  03/02/11  Yes [provider]  famotidine (PEPCID) 20 MG tablet Take by mouth.   Yes [provider]  feeding supplement (BOOST HIGH PROTEIN) LIQD Take 1 Container by mouth 3 (three) times daily between meals.   Yes [provider]  furosemide (LASIX) 20 MG tablet Take 20 mg by mouth daily.  02/22/15  Yes [provider]  loperamide (IMODIUM A-D) 2 MG tablet Take 4 mg by mouth 4 (four) times daily as needed for diarrhea or loose stools.   Yes [provider]  Multiple Vitamins-Minerals (CENTRUM SILVER) tablet Take 1 tablet by mouth daily.  03/02/11  Yes [provider]  ondansetron (ZOFRAN) 8 MG tablet Take 1 tablet (8 mg total) by mouth every 8 (eight) hours as needed for nausea or vomiting. 09/17/17  Yes Burns,  Wandra Feinstein, NP  prochlorperazine (COMPAZINE) 10 MG tablet Take 1 tablet (10 mg total) by mouth every 6 (six) hours as needed for nausea or vomiting. 09/17/17  Yes Burns, Wandra Feinstein, NP  albuterol (PROVENTIL HFA;VENTOLIN HFA) 108 (90 BASE) MCG/ACT inhaler Inhale 2 puffs into the lungs every 6 (six) hours as needed for wheezing or shortness of breath.    [provider]  potassium chloride SA (K-DUR,KLOR-CON) 20 MEQ tablet Take 2 tablets (40 mEq total) by mouth 2 (two) times daily. Patient not taking: Reported on 01/03/2018 11/23/17   Lloyd Huger, MD   Review of Systems  Constitutional: Positive for fatigue. Negative for appetite change.  HENT: Negative for  congestion, postnasal drip and sore throat.   Eyes: Negative.   Respiratory: Positive for shortness of breath (minimal). Negative for chest tightness.   Cardiovascular: Negative for chest pain, palpitations and leg swelling.  Gastrointestinal: Positive for diarrhea (due to intestional cancer). Negative for abdominal distention.  Endocrine: Negative.   Genitourinary: Negative.   Musculoskeletal: Positive for arthralgias (achy around ankles). Negative for back pain and neck pain.  Skin: Negative.   Allergic/Immunologic: Negative.   Neurological: Negative for dizziness and light-headedness.  Hematological: Negative for adenopathy. Does not bruise/bleed easily.  Psychiatric/Behavioral: Negative for dysphoric mood and sleep disturbance. The patient is not nervous/anxious.    Vitals:   04/25/18 1129  BP: 139/85  Pulse: 86  Resp: 18  SpO2: 94%  Weight: 68 lb (30.8 kg)  Height: 4\' 9"  (1.448 m)   Wt Readings from Last 3 Encounters:  04/25/18 68 lb (30.8 kg)  04/12/18 68 lb 5.5 oz (31 kg)  01/18/18 67 lb 7.4 oz (30.6 kg)   Lab Results  Component Value Date   CREATININE 0.66 04/12/2018   CREATININE 0.70 03/15/2018   CREATININE 0.71 01/18/2018    Physical Exam  Constitutional: She is oriented to person, place, and time. She appears well-developed. She appears cachectic.  HENT:  Head: Normocephalic and atraumatic.  Neck: Normal range of motion. Neck supple. No JVD present.  Cardiovascular: Normal rate and regular rhythm.  Pulmonary/Chest: Effort normal. No respiratory distress. She has no wheezes. She has no rales.  Abdominal: Soft. She exhibits no distension.  Musculoskeletal:       Right lower leg: She exhibits no tenderness and no edema.       Left lower leg: She exhibits no tenderness and no edema.  Neurological: She is alert and oriented to person, place, and time.  Skin: Skin is warm and dry.  Psychiatric: She has a normal mood and affect. Her behavior is normal.  Nursing  note and vitals reviewed.  Assessment & Plan:  1: Chronic heart failure with preserved ejection fraction- - NYHA class II - euvolemic today - weighing daily and says that her weight has been stable over the last few months. Reminded to call for an overnight weight gain of >2 pounds or a weekly weight gain of >5 pounds - weight stable from last visit here 4 months ago - not adding salt to her food and tries to keep daily sodium intake to 2000mg  daily - saw cardiology ([place]) 11/07/17 - BMP 04/12/18 reviewed and showed sodium 141, potassium 3.5, creatinine 0.66 and GFR >60 - BNP 06/02/15 was 108.0  2: Carcinoid tumor of intestine- - saw oncologist Grayland Ormond) 04/12/18  Patient did not bring her medications nor a list. Each medication was verbally reviewed with the patient and she was encouraged to bring the bottles to every  visit to confirm accuracy of list.  Will not make a return appointment for patient at this time. Advised her that she could call back at anytime to make another appointment.

## 2018-04-25 NOTE — Patient Instructions (Signed)
Continue weighing daily and call for an overnight weight gain of > 2 pounds or a weekly weight gain of >5 pounds. 

## 2018-04-26 ENCOUNTER — Encounter: Payer: Self-pay | Admitting: Family

## 2018-05-08 ENCOUNTER — Telehealth: Payer: Self-pay | Admitting: *Deleted

## 2018-05-08 NOTE — Telephone Encounter (Signed)
If they are intent on transferring care, we will get all of her records to [facility] and they do not need this appointment.

## 2018-05-08 NOTE — Telephone Encounter (Signed)
Per Dr Grayland Ormond, he can see her Friday. Veronica Wang informed and states she will come with her to that appointment and that she will be transferring her care to [facility].

## 2018-05-08 NOTE — Telephone Encounter (Signed)
Daughter Morrell Riddle called to report that patient is continuing to lose weight, has diarrhea all day and that her cancer is continuing to grow. "She had been on Sandostatin and it quit working, so she was started on a new injection and it has NOT done squat" Patient has asked about a clinical trial and keeps getting "I'm looking into it and I'm not sure it is right for you" "Even though the new injection is not working nothing is being changed and you keep giving her the injection and charging her insurance for it, she is down to all of 60 pounds. She has to worry all the time about messing herself and is taking diarrhea medicine every day and that can't be good for her" "She is asking if Sandostatin can be tried again, she also reports that she is looking into getting her back to [facility], because he clearly does not know what he is doing or does not care about her. I want everyone involved in her care to know what I have said" Please advise

## 2018-05-10 ENCOUNTER — Inpatient Hospital Stay: Payer: Medicare Other

## 2018-05-10 ENCOUNTER — Inpatient Hospital Stay: Payer: Medicare Other | Admitting: Oncology

## 2018-05-10 ENCOUNTER — Ambulatory Visit
Admit: 2018-05-10 | Discharge: 2018-05-11 | Payer: MEDICARE | Attending: Hematology & Oncology | Primary: Hematology & Oncology

## 2018-05-10 ENCOUNTER — Ambulatory Visit: Admit: 2018-05-10 | Discharge: 2018-05-11 | Payer: MEDICARE

## 2018-05-10 DIAGNOSIS — E340 Carcinoid syndrome: Secondary | ICD-10-CM

## 2018-05-10 DIAGNOSIS — C7B8 Other secondary neuroendocrine tumors: Secondary | ICD-10-CM

## 2018-05-10 DIAGNOSIS — C7A8 Other malignant neuroendocrine tumors: Secondary | ICD-10-CM

## 2018-05-10 DIAGNOSIS — E43 Unspecified severe protein-calorie malnutrition: Secondary | ICD-10-CM

## 2018-05-10 DIAGNOSIS — J438 Other emphysema: Secondary | ICD-10-CM

## 2018-05-10 DIAGNOSIS — D3A00 Benign carcinoid tumor of unspecified site: Principal | ICD-10-CM

## 2018-05-10 MED ORDER — OCTREOTIDE ACETATE 100 MCG/ML INJECTION SOLUTION
Freq: Three times a day (TID) | INTRAVENOUS | 11 refills | 0 days | Status: CP
Start: 2018-05-10 — End: 2018-05-11

## 2018-05-11 MED ORDER — OCTREOTIDE ACETATE 100 MCG/ML INJECTION SOLUTION
Freq: Three times a day (TID) | SUBCUTANEOUS | 11 refills | 0 days | Status: CP
Start: 2018-05-11 — End: 2018-05-30

## 2018-05-21 ENCOUNTER — Other Ambulatory Visit: Payer: Self-pay | Admitting: Internal Medicine

## 2018-05-21 DIAGNOSIS — R600 Localized edema: Secondary | ICD-10-CM

## 2018-05-21 DIAGNOSIS — I700 Atherosclerosis of aorta: Secondary | ICD-10-CM

## 2018-05-22 ENCOUNTER — Ambulatory Visit
Admission: RE | Admit: 2018-05-22 | Discharge: 2018-05-22 | Disposition: A | Discharge status: Home / Self Care | Payer: Medicare Other | Source: Ambulatory Visit | Attending: Internal Medicine | Admitting: Internal Medicine

## 2018-05-22 DIAGNOSIS — R600 Localized edema: Secondary | ICD-10-CM | POA: Insufficient documentation

## 2018-05-22 DIAGNOSIS — I700 Atherosclerosis of aorta: Secondary | ICD-10-CM | POA: Insufficient documentation

## 2018-05-24 MED ORDER — TELOTRISTAT ETIPRATE 250 MG TABLET
ORAL_TABLET | Freq: Three times a day (TID) | ORAL | 3 refills | 0 days | Status: CP
Start: 2018-05-24 — End: 2018-05-27

## 2018-05-24 MED ORDER — OCTREOTIDE ACETATE 100 MCG/ML INJECTION SOLUTION
Freq: Three times a day (TID) | SUBCUTANEOUS | 0 refills | 0.00000 days
Start: 2018-05-24 — End: 2018-05-24

## 2018-05-24 MED ORDER — OCTREOTIDE ACETATE 1,000 MCG/ML INJECTION SOLUTION
Freq: Three times a day (TID) | SUBCUTANEOUS | 0 refills | 0.00000 days
Start: 2018-05-24 — End: 2018-05-24

## 2018-05-27 MED ORDER — TELOTRISTAT ETIPRATE 250 MG TABLET
ORAL_TABLET | Freq: Three times a day (TID) | ORAL | 3 refills | 0 days | Status: CP
Start: 2018-05-27 — End: ?

## 2018-05-27 NOTE — Unmapped (Signed)
[facility] Specialty Medication Referral: [place] APPROVED    Medication (Brand/Generic): OCTREOTIDE    Initial Test Claim completed with resulted information below:    Patient ABLE to fill at [facility]  Insurance Company:  [facility]  Anticipated Copay: $500  Is anticipated copay with a copay card or grant? NO, NONE AVAILABLE    Does patient's insurance plan only allow a 15 day supply for the first 6 fills in the [place] Program? NO  If yes, inform patient they can request to dis-enroll from the [facility] by calling the patient help desk at N/A.      If the copay is under the $25 defined limit, per policy there will be no further investigation of need for financial assistance at this time unless patient requests. This referral has been communicated to the provider and handed off to the [facility] team for further processing and filling of prescribed medication.   ______________________________________________________________________  Please utilize this referral for viewing purposes as it will serve as the central location for all relevant documentation and updates.

## 2018-05-27 NOTE — Unmapped (Signed)
Rerouted Xermelo to [facility] per requirement by manufacturer.    Care coordination: 5 minutes    Konrad Penta, PharmD, BCOP, CPP  Clinical Pharmacist Practitioner, Gastrointestinal Oncology  Pager: [phone]

## 2018-05-27 NOTE — Unmapped (Signed)
Hi,     [facility] contacted the Communication Center requesting to speak with the care team of Allison Fisher to discuss:    Informing us they receive the Xermelo.  They are verifying the insurance and then will update [name].    Thank you,   Vernie Ammons  [facility] Communication [facility]   [phone]

## 2018-05-28 NOTE — Unmapped (Signed)
Attempted to call pt and daughter regarding appointment on Friday. Neither answered. Left VM with direct call back number.    Need to let them know that the pt does not need to come 11/22, she will need to come the next week to get her Sandostatin injection and then we will reschedule her appointment with DR. Sanoff once the Carlyle Lipa gets approved.

## 2018-05-29 NOTE — Unmapped (Signed)
Hi,     Patrice with [facility] contacted the Communication Center requesting to speak with the care team of Allison Fisher to discuss:    Requesting ICD-10 code for Xermelo 250 mg tablet prescription.    Please contact Patrice at [email or web address].    Program: GI  Speciality: Medical Oncology    Check Indicates criteria has been reviewed and confirmed with the patient:    []  Preferred Name   [x]  DOB and/or MR#  [x]  Preferred Contact Method  [x]  Phone Number(s)   []  MyChart     Thank you,   Kelli Hope  [place] [facility]   [phone]

## 2018-05-30 NOTE — Unmapped (Signed)
Hi,     Sherri contacted the Communication Center requesting to speak with the care team of Allison Fisher to discuss:    Clarification on Care Plan.  And double checking to make sure the plan is still to replace Sandostatin  With the new medication.  And when they need to come in if not tomorrow.    Please contact Sherri at [phone].    Program: Heme Malignancy  Speciality: Medical Oncology    Check Indicates criteria has been reviewed and confirmed with the patient:    []  Preferred Name   [x]  DOB and/or MR#  [x]  Preferred Contact Method  [x]  Phone Number(s)   []  MyChart     Thank you,   Vernie Ammons  [facility] Communication Center   [phone]

## 2018-05-30 NOTE — Unmapped (Signed)
Spoke to pt's daughter and discussed the delay in getting back to her about lanreotide vs. Sandostatin. I explained that the pharmacist and Dr. Forbes Cellar are figuring out the dosing and we are still working on the [place]. I told her that we will go ahead and schedule pt for 11/27 for lanreotide injection and then once she starts the [facility], we will set up follow-up with Dr. Forbes Cellar. She reports understanding and denies any further questions or concerns at this time.

## 2018-05-30 NOTE — Unmapped (Signed)
Hi,     [facility] contacted the Communication Center requesting to speak with the care team of Allison Fisher to discuss:    They would like to begin Prior Auth for the [facility] Rx but need the most recent notes.    Please contact at [phone].    Check Indicates criteria has been reviewed and confirmed with the patient:    []  Preferred Name   [x]  DOB and/or MR#  []  Preferred Contact Method  [x]  Phone Number(s)   []  MyChart     Thank you,   Vernie Ammons  [facility] Communication Center   [phone]

## 2018-05-30 NOTE — Unmapped (Signed)
[facility] Refill Coordination Note  Medication:  Octreotide 100 mcg/mL    Unable to reach patient to schedule shipment for medication being filled at [facility]. Phone number on file does not have a voicemail associated with it, and call cannot be completed when we attempt to reach patient.  As this is the 3rd unsuccessful attempt to reach the patient, no additional phone call attempts will be made at this time.      Phone numbers attempted: [phone]  Last scheduled delivery: never filled previously    Please call the [facility] at [phone] (option [phone]) should you have any further questions.      Thanks,  [facility] Specialty Team

## 2018-06-04 NOTE — Unmapped (Signed)
MAP team submitted notification of approval for lanreotide clinic administration on 06/04/18. Urgent/critical non-formulary request also submitted and approved on 06/04/18. Plan for patient to return to clinic on 06/05/18 for lanreotide administration.    Care coordination: 30 minutes    Konrad Penta, PharmD, BCOP, CPP  Clinical Pharmacist Practitioner, Gastrointestinal Oncology  Pager: [phone]

## 2018-06-04 NOTE — Unmapped (Signed)
Hi,     [facility] contacted the Communication Center requesting to speak with the care team of Allison Fisher to discuss:    [name] Rx CAN NOT be released without most recent notes from Dr. Forbes Cellar.    Please fax notes to [phone] co [name].    Program: GI  Speciality: Medical Oncology    Check Indicates criteria has been reviewed and confirmed with the patient:    [x]  Preferred Name   [x]  DOB and/or MR#  [x]  Preferred Contact Method  [x]  Phone Number(s)   [x]  MyChart     Thank you,   Kelli Hope  [facility]   [phone]

## 2018-06-04 NOTE — Unmapped (Signed)
Hi,     [facility] contacted the Communication Center regarding the following:    - stated that they received the prior authorization for the medication Xermelo 250 mg tablet but the copay for the medication would be $2,748.32.  [facility] representative stated that they are going to forward the application over to the manufacturer     Please contact [facility] at [phone].    Thanks in advance,    Tylene Fantasia  [facility]   [phone]

## 2018-06-05 ENCOUNTER — Institutional Professional Consult (permissible substitution): Admit: 2018-06-05 | Discharge: 2018-06-06 | Payer: MEDICARE

## 2018-06-05 DIAGNOSIS — E340 Carcinoid syndrome: Secondary | ICD-10-CM

## 2018-06-05 DIAGNOSIS — C7A8 Other malignant neuroendocrine tumors: Principal | ICD-10-CM

## 2018-06-05 DIAGNOSIS — C7B8 Other secondary neuroendocrine tumors: Secondary | ICD-10-CM

## 2018-06-10 ENCOUNTER — Inpatient Hospital Stay: Payer: Medicare Other

## 2018-06-11 NOTE — Unmapped (Signed)
I attempted to contact Allison Fisher on 06/11/2018 by phone in order to follow up on Xermelo manufacturer assistance (approval obtained 06/10/18), but was unable to reach the patient at [phone] (call is routed straight to a message that indicates the call cannot be completed).    Attempted to contact patient's caregiver [name]); left a voicemail with my contact information and requested a call back.    Of note, [facility] [phone]) is the dispensing pharmacy for the assistance program, and their next step is to contact the patient by phone to schedule shipment of Xermelo.    Care coordination: 15 minutes    Konrad Penta, PharmD, BCOP, CPP  Clinical Pharmacist Practitioner, Gastrointestinal Oncology  Pager: [phone]

## 2018-06-12 NOTE — Unmapped (Signed)
Spoke with pt daughter, Lavonna Rua and relayed information regarding Xermelo.   Daughter has no further questions at this time,

## 2018-06-21 ENCOUNTER — Institutional Professional Consult (permissible substitution): Admit: 2018-06-21 | Discharge: 2018-06-21 | Payer: MEDICARE

## 2018-06-21 DIAGNOSIS — C7A8 Other malignant neuroendocrine tumors: Principal | ICD-10-CM

## 2018-06-21 DIAGNOSIS — E340 Carcinoid syndrome: Secondary | ICD-10-CM

## 2018-06-21 DIAGNOSIS — C7B8 Other secondary neuroendocrine tumors: Secondary | ICD-10-CM

## 2018-07-05 ENCOUNTER — Ambulatory Visit: Payer: Medicare Other | Admitting: Oncology

## 2018-07-05 ENCOUNTER — Other Ambulatory Visit: Payer: Medicare Other

## 2018-07-05 ENCOUNTER — Ambulatory Visit: Payer: Medicare Other

## 2018-07-05 ENCOUNTER — Ambulatory Visit
Admit: 2018-07-05 | Discharge: 2018-07-05 | Payer: MEDICARE | Attending: Hematology & Oncology | Primary: Hematology & Oncology

## 2018-07-05 ENCOUNTER — Ambulatory Visit: Admit: 2018-07-05 | Discharge: 2018-07-05 | Payer: MEDICARE

## 2018-07-05 DIAGNOSIS — C7B8 Other secondary neuroendocrine tumors: Secondary | ICD-10-CM

## 2018-07-05 DIAGNOSIS — E340 Carcinoid syndrome: Secondary | ICD-10-CM

## 2018-07-05 DIAGNOSIS — C7A8 Other malignant neuroendocrine tumors: Principal | ICD-10-CM

## 2018-07-05 DIAGNOSIS — D3A00 Benign carcinoid tumor of unspecified site: Principal | ICD-10-CM

## 2018-07-05 NOTE — Unmapped (Signed)
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 Slowly progressing  2. Improved diarrhea with combo of increased lanreotide and telotristat  3,. Weight stablized  4 Comorbid O2 dependent COPD  5. ECOG PS 2    RECOMMENDATIONS  1. Continue lanreotide and telotristat  2. Can consider panc enzymes if diarrhea worsens  3. Defer any cancer directed treatment for now. Can consider PRRT if continues to improve clinically     DISCUSSION  Allison Fisher is doing considerably better on our current regimen, though its only been two weeks of improvement. We discussed continuing the current support for her carcinoid syndrome. We can most certainly consider PRRT if she were to gain weight and get a bit stronger but I am wary of doing anything that would compromise what for now is the best QOL she has had in a couple of years. They agree.     Back here for a check up and assess symptoms in about 6 weeks to coincide with lanreotide dosing  ______________________________________________________________________  HISTORY     Allison Fisher is seen today at the [facility] for consultation regarding met NET      Allison Fisher had had some improvement! Since starting the telotristat is able to get out of the house. She has been having bm early in the am and then 1-2 more in the am but then none the rest of the day. She has been able to eat a bit more though admits she is out of the habit of eating so is still a bit wary of it. Was about to get out of the house and do xmas shopping and other things with her family. Her mood is much better because of it.     She has not gained any weight though she feels like she should have.     MEDICAL HISTORY  Interval: none but that related to her NET  Prior:   1. COPD, o2 dependent at night, wear some during the day if she feels sob.   2. Atrial fibrillation   3. CHF- diastolic    No Known Allergies  Medications reviewed in the EMR    SOCIAL HISTORY  Comes with her daughter. Enjoyed being with family over [place].   Prior: Much of her family lives closeby. She used to be a smoker but quit she lives alone, though her daughter has encouraged her to move in,     REVIEW OF SYSTEMS  The remainder of a comprehensive 10 systems review is negative.    PHYSICAL EXAM  BP 144/82  - Pulse 76  - Temp 36.4 ??C (97.6 ??F) (Oral)  - Resp 17  - Ht 152.5 cm (5' 0.04)  - Wt 31.4 kg (  69 lb 4.8 oz)  - SpO2 96%  - BMI 13.52 kg/m??    GENERAL: a bit more spry, able to get on exam table prtty quickly  [facility]: full and appropriate range of affect with good insight and judgement  HEENT: NCAT, pupils equal, sclerae anicteric, OP clear without erythema, exudate or ulceration; neck supple without thyromegaly or masses;   LYMPH: no cervical, supraclavicular, periumbilical adenopathy  PULM: normal resp rate, clear bilaterally without wheezes, rhonchi, rales, air movement a bit diminished   COR: regular without murmur, rub, gallop. distant  GI: abdomen soft, nontender, nondistended, no palpable hepatosplenomegaly   EXT: warm and well perfused, no edema SKIN: No rashes    OBJECTIVE DATA  No labs    RADIOLOGY RESULTS (scans are personally reviewed by me)  None today

## 2018-07-05 NOTE — Unmapped (Signed)
Summary from today:  Continue the lanreotide every 14 days  Continue the telotristat as you are doing  Try and eat some more calories  Return visit in 6 weeks.     For health related questions call:    For appointments & questions Monday through Friday 8 AM??? 5 PM   please call [phone] or Toll free [phone].    On Nights, Weekends and Holidays  Call [phone] and ask for the oncologist on call.    Your GI Medical OncologyTreatment Team    Doctor: Heber [place]. Forbes Cellar, MD  Nurse Practitioner: Eula Fried, ANP  Nurse Navigator: Ranell Patrick, RN  Clinical Pharmacist: Konrad Penta, PharmD  Scheduling Administrator: Ms. Vickey Huger

## 2018-07-18 ENCOUNTER — Emergency Department: Payer: Medicare Other

## 2018-07-18 ENCOUNTER — Emergency Department
Admission: EM | Admit: 2018-07-18 | Discharge: 2018-07-18 | Disposition: A | Discharge status: Home / Self Care | Payer: Medicare Other | Attending: Emergency Medicine | Admitting: Emergency Medicine

## 2018-07-18 DIAGNOSIS — K2970 Gastritis, unspecified, without bleeding: Secondary | ICD-10-CM | POA: Diagnosis not present

## 2018-07-18 DIAGNOSIS — I4891 Unspecified atrial fibrillation: Secondary | ICD-10-CM | POA: Diagnosis not present

## 2018-07-18 DIAGNOSIS — I509 Heart failure, unspecified: Secondary | ICD-10-CM | POA: Insufficient documentation

## 2018-07-18 DIAGNOSIS — J449 Chronic obstructive pulmonary disease, unspecified: Secondary | ICD-10-CM | POA: Diagnosis not present

## 2018-07-18 DIAGNOSIS — Z87891 Personal history of nicotine dependence: Secondary | ICD-10-CM | POA: Diagnosis not present

## 2018-07-18 DIAGNOSIS — E785 Hyperlipidemia, unspecified: Secondary | ICD-10-CM | POA: Diagnosis not present

## 2018-07-18 DIAGNOSIS — R079 Chest pain, unspecified: Secondary | ICD-10-CM

## 2018-07-18 DIAGNOSIS — Z79899 Other long term (current) drug therapy: Secondary | ICD-10-CM | POA: Insufficient documentation

## 2018-07-18 DIAGNOSIS — I10 Essential (primary) hypertension: Secondary | ICD-10-CM | POA: Insufficient documentation

## 2018-07-18 LAB — CBC
HCT: 45.7 % (ref 36.0–46.0)
Hemoglobin: 14.9 g/dL (ref 12.0–15.0)
MCH: 26.7 pg (ref 26.0–34.0)
MCHC: 32.6 g/dL (ref 30.0–36.0)
MCV: 81.8 fL (ref 80.0–100.0)
Platelets: 271 10*3/uL (ref 150–400)
RBC: 5.59 MIL/uL — ABNORMAL HIGH (ref 3.87–5.11)
RDW: 16.1 % — ABNORMAL HIGH (ref 11.5–15.5)
WBC: 19.5 10*3/uL — ABNORMAL HIGH (ref 4.0–10.5)
nRBC: 0 % (ref 0.0–0.2)

## 2018-07-18 LAB — BASIC METABOLIC PANEL
Anion gap: 10 (ref 5–15)
BUN: 13 mg/dL (ref 8–23)
CO2: 28 mmol/L (ref 22–32)
Calcium: 8.9 mg/dL (ref 8.9–10.3)
Chloride: 99 mmol/L (ref 98–111)
Creatinine, Ser: 0.7 mg/dL (ref 0.44–1.00)
GFR calc Af Amer: >60 mL/min (ref >60)
GFR calc non Af Amer: >60 mL/min (ref >60)
GLUCOSE: 110 mg/dL — AB (ref 70–99)
Potassium: 3.3 mmol/L — ABNORMAL LOW (ref 3.5–5.1)
Sodium: 137 mmol/L (ref 135–145)

## 2018-07-18 LAB — HEPATIC FUNCTION PANEL
ALT: 20 U/L (ref 0–44)
AST: 40 U/L (ref 15–41)
Albumin: 3.5 g/dL (ref 3.5–5.0)
Alkaline Phosphatase: 138 U/L — ABNORMAL HIGH (ref 38–126)
BILIRUBIN DIRECT: 0.2 mg/dL (ref 0.0–0.2)
BILIRUBIN TOTAL: 0.8 mg/dL (ref 0.3–1.2)
Indirect Bilirubin: 0.6 mg/dL (ref 0.3–0.9)
Total Protein: 7.1 g/dL (ref 6.5–8.1)

## 2018-07-18 LAB — LIPASE, BLOOD: Lipase: 21 U/L (ref 11–51)

## 2018-07-18 LAB — TROPONIN I: Troponin I: <0.03 ng/mL (ref <0.03)

## 2018-07-18 MED ORDER — LIDOCAINE VISCOUS HCL 2 % MT SOLN: 15.0000 mL | Freq: Four times a day (QID) | OROMUCOSAL | 0 refills | Status: DC | PRN

## 2018-07-18 MED ORDER — ALUM & MAG HYDROXIDE-SIMETH 200-200-20 MG/5ML PO SUSP
30.0000 mL | Freq: Once | ORAL | Status: AC
  Administered 2018-07-18: 30 mL via ORAL
  Filled 2018-07-18: qty 30

## 2018-07-18 MED ORDER — IOPAMIDOL (ISOVUE-300) INJECTION 61%
50.0000 mL | Freq: Once | INTRAVENOUS | Status: AC | PRN
  Administered 2018-07-18: 50 mL via INTRAVENOUS

## 2018-07-18 MED ORDER — SUCRALFATE 1 G PO TABS: 1.0000 g | ORAL_TABLET | Freq: Four times a day (QID) | ORAL | 0 refills | Status: DC

## 2018-07-18 MED ORDER — LIDOCAINE VISCOUS HCL 2 % MT SOLN
15.0000 mL | Freq: Once | OROMUCOSAL | Status: AC
  Administered 2018-07-18: 15 mL via ORAL
  Filled 2018-07-18: qty 15

## 2018-07-18 NOTE — ED Notes (Signed)
Patient verbalized understanding of discharge instructions, no questions. Patient ambulated out of ED with steady gait in no distress.  

## 2018-07-18 NOTE — ED Provider Notes (Signed)
[facility] Emergency Department Provider Note   ____________________________________________   I have reviewed the triage vital signs and the nursing notes.   HISTORY  Chief Complaint Chest Pain   History limited by: Not Limited   HPI Veronica Wang is a 83 y.o. female who presents to the emergency department today because of concerns for chest pain.  Patient states that the pain has been there for a little over 2 weeks.  She states however for the past couple of days it is getting worse.  Is located in the center chest and epigastric area.  The upper chest is more sharp the epigastric area is more burning type pain.  Patient states she does have a history of heartburn and thought that it might be heartburn initially because it got worse after she eats spicy food over the holidays.  The patient denies any associated shortness of breath.  She did have some vomiting yesterday that was nonbloody.  She states she keeps diarrhea.  She denies any fevers.  Per medical record review patient has a history of COPD, CHF, cancer.  Past Medical History:  Diagnosis Date  . Abnormal MRI 09/10/2015  . Atrial fibrillation ([place])   . Cancer ([place])    Liver  . Cancer ([place])    stomach  . Carcinoid tumor of intestine   . CHF (congestive heart failure) ([place])   . COPD (chronic obstructive pulmonary disease) ([place])   . FH: chemotherapy   . GERD (gastroesophageal reflux disease)   . Hyperlipidemia   . Hypertension   . Macular degeneration   . Polycythemia   . Radiation     Patient Active Problem List   Diagnosis Date Noted  . Chronic diastolic heart failure ([place]) 11/27/2017  . Atrial fibrillation ([place]) 11/27/2017  . Abnormal MRI 09/10/2015  . Malnutrition of moderate degree ([place]) 12/06/2014  . Pneumonia 12/05/2014  . COPD (chronic obstructive pulmonary disease) ([place]) 12/05/2014  . 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 CATARACT EXTRACTION  splenectomy  . COLON RESECTION    . COLON SURGERY    . GASTRECTOMY    . SPLENECTOMY      Prior to Admission medications   Medication Sig Start Date End Date Taking? Authorizing Provider  acetaminophen (TYLENOL) 325 MG tablet Take 2 tablets (650 mg total) by mouth every 6 (six) hours as needed for mild pain (or Fever >/= 101). 12/15/14   Tama High III, MD  albuterol (PROVENTIL HFA;VENTOLIN HFA) 108 (90 BASE) MCG/ACT inhaler Inhale 2 puffs into the lungs every 6 (six) hours as needed for wheezing or shortness of breath.    [provider]  aspirin EC 81 MG tablet Take 81 mg by mouth daily.     [provider]  Cholecalciferol (VITAMIN D-1000 MAX ST) 1000 UNITS tablet Take 1,000 Units by mouth daily.     [provider]  Cyanocobalamin (VITAMIN B-12 CR) 1000 MCG TBCR Take 1 tablet by mouth daily.  03/02/11   [provider]  famotidine (PEPCID) 20 MG tablet Take by mouth.    [provider]  feeding supplement (BOOST HIGH PROTEIN) LIQD Take 1 Container by mouth 3 (three) times daily between meals.    [provider]  furosemide (LASIX) 20 MG tablet Take 20 mg by mouth daily.  02/22/15   [provider]  loperamide (IMODIUM A-D) 2 MG tablet Take 4 mg by mouth 4 (four)  times daily as needed for diarrhea or loose stools.    [provider]  Multiple Vitamins-Minerals (CENTRUM SILVER) tablet Take 1 tablet by mouth daily.  03/02/11   [provider]  ondansetron (ZOFRAN) 8 MG tablet Take 1 tablet (8 mg total) by mouth every 8 (eight) hours as needed for nausea or vomiting. 09/17/17   Jacquelin Hawking, NP  potassium chloride SA (K-DUR,KLOR-CON) 20 MEQ tablet Take 2 tablets (40 mEq total) by mouth 2 (two) times daily. Patient not taking: Reported on 01/03/2018 11/23/17   Lloyd Huger, MD  prochlorperazine (COMPAZINE) 10 MG tablet Take 1 tablet (10 mg total) by mouth every 6 (six)  hours as needed for nausea or vomiting. 09/17/17   Jacquelin Hawking, NP    Allergies No known allergies  Family History  Problem Relation Age of Onset  . Heart attack Sister   . Heart attack Brother   . Leukemia Mother     Social History Social History   Tobacco Use  . Smoking status: Former Smoker    Types: Cigarettes  . Smokeless tobacco: Never Used  Substance Use Topics  . Alcohol use: No    Alcohol/week: 0.0 standard drinks  . Drug use: No    Review of Systems Constitutional: No fever/chills Eyes: No visual changes. ENT: No sore throat. Cardiovascular: Positive for chest pain. Respiratory: Denies shortness of breath. Gastrointestinal: Positive for epigastric pain, positive for nausea.  Genitourinary: Negative for dysuria. Musculoskeletal: Negative for back pain. Skin: Negative for rash. Neurological: Negative for headaches, focal weakness or numbness.  ____________________________________________   PHYSICAL EXAM:  VITAL SIGNS: ED Triage Vitals  Enc Vitals Group     BP 07/18/18 1045 (!) 158/78     Pulse Rate 07/18/18 1045 89     Resp 07/18/18 1045 20     Temp 07/18/18 1045 97.8 F (36.6 C)     Temp Source 07/18/18 1045 Oral     SpO2 07/18/18 1045 97 %     Weight 07/18/18 1043 69 lb (31.3 kg)     Height 07/18/18 1043 5' (1.524 m)   Constitutional: Alert and oriented.  Eyes: Conjunctivae are normal.  ENT      Head: Normocephalic and atraumatic.      Nose: No congestion/rhinnorhea.      Mouth/Throat: Mucous membranes are moist.      Neck: No stridor. Hematological/Lymphatic/Immunilogical: No cervical lymphadenopathy. Cardiovascular: Normal rate, regular rhythm.  No murmurs, rubs, or gallops.  Respiratory: Normal respiratory effort without tachypnea nor retractions. Breath sounds are clear and equal bilaterally. No wheezes/rales/rhonchi. Gastrointestinal: Soft and non tender. No rebound. No guarding.  Genitourinary: Deferred Musculoskeletal: Normal  range of motion in all extremities. No lower extremity edema. Neurologic:  Normal speech and language. No gross focal neurologic deficits are appreciated.  Skin:  Skin is warm, dry and intact. No rash noted. Psychiatric: Mood and affect are normal. Speech and behavior are normal. Patient exhibits appropriate insight and judgment.  ____________________________________________    LABS (pertinent positives/negatives)  CBC wbc 19.5, hgb 14.9, plt 271 Trop <0.03 BMP wnl except k 3.3, glu 110 Lipase 21 LFTs wnl except alk phos 138 ____________________________________________   EKG  I, Nance Pear, attending physician, personally viewed and interpreted this EKG  EKG Time: 1046 Rate: 85 Rhythm: sinus rhythm Axis: right axis deviation Intervals: qtc 455 QRS: LVH ST changes: no st elevation Impression: abnormal ekg  ____________________________________________    RADIOLOGY  CXR No acute disease  CT abd/pel No acute  disease, cancer masses seen  ____________________________________________   PROCEDURES  Procedures  ____________________________________________   INITIAL IMPRESSION / ASSESSMENT AND PLAN / ED COURSE  Pertinent labs & imaging results that were available during my care of the patient were reviewed by me and considered in my medical decision making (see chart for details).   Patient presented to the emergency department today because of concerns for chest pain.  Differential be broad including pneumonia pneumothorax PE ACS dissection costochondritis esophagitis amongst other etiologies.  Patient's chest x-ray without concerning findings.  Patient's blood work without any elevation of troponin or lipase.  Patient did have a leukocytosis.  At this point is of unclear etiology.  No pneumonia on chest x-ray.  I did obtain a CT abdomen pelvis which did not show any acute infectious process.  Do wonder if it could be secondary to the discomfort the patient was  having.  She did state that her pain was significantly improved after GI cocktail.  At this point I think gastritis and esophagitis likely.  Discussed this with the patient.   ____________________________________________   FINAL CLINICAL IMPRESSION(S) / ED DIAGNOSES  Final diagnoses:  Nonspecific chest pain  Gastritis, presence of bleeding unspecified, unspecified chronicity, unspecified gastritis type     Note: This dictation was prepared with Dragon dictation. Any transcriptional errors that result from this process are unintentional     Nance Pear, MD 07/18/18 1336

## 2018-07-18 NOTE — Discharge Instructions (Signed)
Please seek medical attention for any high fevers, chest pain, shortness of breath, change in behavior, persistent vomiting, bloody stool or any other new or concerning symptoms.  

## 2018-07-18 NOTE — ED Triage Notes (Signed)
Pt came to ED via EMS from home c/o chest pain x2 days. Pt is a cancer pt. Liver cancer w/ mets to the liver. Pt reports left sided chest pain radiating down to stomach with sob.

## 2018-07-19 ENCOUNTER — Institutional Professional Consult (permissible substitution): Admit: 2018-07-19 | Discharge: 2018-07-20 | Payer: MEDICARE

## 2018-07-19 DIAGNOSIS — E340 Carcinoid syndrome: Secondary | ICD-10-CM

## 2018-07-19 DIAGNOSIS — C7B8 Other secondary neuroendocrine tumors: Secondary | ICD-10-CM

## 2018-07-19 DIAGNOSIS — C7A8 Other malignant neuroendocrine tumors: Principal | ICD-10-CM

## 2018-07-19 NOTE — Unmapped (Signed)
Pt received Somatuline injection in right outer quad -Sub-q. pt tolerated well.    AVS given.    Pt stable and ambulated independently from clinic.

## 2018-08-01 ENCOUNTER — Institutional Professional Consult (permissible substitution): Admit: 2018-08-01 | Discharge: 2018-08-02 | Payer: MEDICARE

## 2018-08-01 DIAGNOSIS — C7B8 Other secondary neuroendocrine tumors: Secondary | ICD-10-CM

## 2018-08-01 DIAGNOSIS — C7A8 Other malignant neuroendocrine tumors: Principal | ICD-10-CM

## 2018-08-01 DIAGNOSIS — E340 Carcinoid syndrome: Secondary | ICD-10-CM

## 2018-08-01 NOTE — Unmapped (Signed)
Pt received somatuline injection in left outer quad of hip Sub q - pt tolerated well.    AVS refused.    Pt stable and ambulated independently from clinic.

## 2018-08-13 ENCOUNTER — Other Ambulatory Visit: Payer: Self-pay

## 2018-08-13 ENCOUNTER — Emergency Department: Payer: Medicare Other

## 2018-08-13 ENCOUNTER — Inpatient Hospital Stay
Admission: EM | Admit: 2018-08-13 | Discharge: 2018-08-15 | DRG: 064 | Disposition: A | Discharge status: Home Health | Payer: Medicare Other | Attending: Internal Medicine | Admitting: Internal Medicine

## 2018-08-13 ENCOUNTER — Encounter: Payer: Self-pay | Admitting: Emergency Medicine

## 2018-08-13 DIAGNOSIS — E785 Hyperlipidemia, unspecified: Secondary | ICD-10-CM | POA: Diagnosis present

## 2018-08-13 DIAGNOSIS — Z79899 Other long term (current) drug therapy: Secondary | ICD-10-CM | POA: Diagnosis not present

## 2018-08-13 DIAGNOSIS — R4701 Aphasia: Secondary | ICD-10-CM | POA: Diagnosis present

## 2018-08-13 DIAGNOSIS — E43 Unspecified severe protein-calorie malnutrition: Secondary | ICD-10-CM

## 2018-08-13 DIAGNOSIS — Z9849 Cataract extraction status, unspecified eye: Secondary | ICD-10-CM

## 2018-08-13 DIAGNOSIS — K219 Gastro-esophageal reflux disease without esophagitis: Secondary | ICD-10-CM | POA: Diagnosis present

## 2018-08-13 DIAGNOSIS — J9610 Chronic respiratory failure, unspecified whether with hypoxia or hypercapnia: Secondary | ICD-10-CM | POA: Diagnosis present

## 2018-08-13 DIAGNOSIS — J449 Chronic obstructive pulmonary disease, unspecified: Secondary | ICD-10-CM | POA: Diagnosis present

## 2018-08-13 DIAGNOSIS — R29704 NIHSS score 4: Secondary | ICD-10-CM | POA: Diagnosis present

## 2018-08-13 DIAGNOSIS — I639 Cerebral infarction, unspecified: Secondary | ICD-10-CM | POA: Diagnosis present

## 2018-08-13 DIAGNOSIS — E876 Hypokalemia: Secondary | ICD-10-CM | POA: Diagnosis present

## 2018-08-13 DIAGNOSIS — I5042 Chronic combined systolic (congestive) and diastolic (congestive) heart failure: Secondary | ICD-10-CM | POA: Diagnosis present

## 2018-08-13 DIAGNOSIS — Z8249 Family history of ischemic heart disease and other diseases of the circulatory system: Secondary | ICD-10-CM | POA: Diagnosis not present

## 2018-08-13 DIAGNOSIS — Z87891 Personal history of nicotine dependence: Secondary | ICD-10-CM | POA: Diagnosis not present

## 2018-08-13 DIAGNOSIS — Z681 Body mass index (BMI) 19 or less, adult: Secondary | ICD-10-CM | POA: Diagnosis not present

## 2018-08-13 DIAGNOSIS — Z7982 Long term (current) use of aspirin: Secondary | ICD-10-CM

## 2018-08-13 DIAGNOSIS — Z8505 Personal history of malignant neoplasm of liver: Secondary | ICD-10-CM | POA: Diagnosis not present

## 2018-08-13 DIAGNOSIS — I480 Paroxysmal atrial fibrillation: Secondary | ICD-10-CM | POA: Diagnosis present

## 2018-08-13 DIAGNOSIS — Z806 Family history of leukemia: Secondary | ICD-10-CM

## 2018-08-13 DIAGNOSIS — Z9081 Acquired absence of spleen: Secondary | ICD-10-CM | POA: Diagnosis not present

## 2018-08-13 DIAGNOSIS — I63512 Cerebral infarction due to unspecified occlusion or stenosis of left middle cerebral artery: Secondary | ICD-10-CM | POA: Diagnosis not present

## 2018-08-13 DIAGNOSIS — I10 Essential (primary) hypertension: Secondary | ICD-10-CM | POA: Diagnosis present

## 2018-08-13 DIAGNOSIS — D490 Neoplasm of unspecified behavior of digestive system: Secondary | ICD-10-CM | POA: Diagnosis present

## 2018-08-13 LAB — COMPREHENSIVE METABOLIC PANEL
ALT: 16 U/L (ref 0–44)
AST: 27 U/L (ref 15–41)
Albumin: 3.3 g/dL — ABNORMAL LOW (ref 3.5–5.0)
Alkaline Phosphatase: 107 U/L (ref 38–126)
Anion gap: 7 (ref 5–15)
BUN: 14 mg/dL (ref 8–23)
CALCIUM: 8.8 mg/dL — AB (ref 8.9–10.3)
CO2: 30 mmol/L (ref 22–32)
CREATININE: 0.67 mg/dL (ref 0.44–1.00)
Chloride: 105 mmol/L (ref 98–111)
GFR calc Af Amer: >60 mL/min (ref >60)
GFR calc non Af Amer: >60 mL/min (ref >60)
Glucose, Bld: 94 mg/dL (ref 70–99)
Potassium: 2.9 mmol/L — ABNORMAL LOW (ref 3.5–5.1)
Sodium: 142 mmol/L (ref 135–145)
TOTAL PROTEIN: 6.3 g/dL — AB (ref 6.5–8.1)
Total Bilirubin: 0.5 mg/dL (ref 0.3–1.2)

## 2018-08-13 LAB — DIFFERENTIAL
Abs Immature Granulocytes: 0.15 10*3/uL — ABNORMAL HIGH (ref 0.00–0.07)
Basophils Absolute: 0.1 10*3/uL (ref 0.0–0.1)
Basophils Relative: 1 %
Eosinophils Absolute: 0.2 10*3/uL (ref 0.0–0.5)
Eosinophils Relative: 2 %
Immature Granulocytes: 2 %
Lymphocytes Relative: 22 %
Lymphs Abs: 2.2 10*3/uL (ref 0.7–4.0)
MONOS PCT: 8 %
Monocytes Absolute: 0.8 10*3/uL (ref 0.1–1.0)
Neutro Abs: 6.6 10*3/uL (ref 1.7–7.7)
Neutrophils Relative %: 65 %

## 2018-08-13 LAB — PROTIME-INR
INR: 1.02
Prothrombin Time: 13.3 seconds (ref 11.4–15.2)

## 2018-08-13 LAB — CBC
HEMATOCRIT: 43.2 % (ref 36.0–46.0)
Hemoglobin: 13.8 g/dL (ref 12.0–15.0)
MCH: 26.3 pg (ref 26.0–34.0)
MCHC: 31.9 g/dL (ref 30.0–36.0)
MCV: 82.3 fL (ref 80.0–100.0)
Platelets: 300 10*3/uL (ref 150–400)
RBC: 5.25 MIL/uL — ABNORMAL HIGH (ref 3.87–5.11)
RDW: 15.9 % — ABNORMAL HIGH (ref 11.5–15.5)
WBC: 10 10*3/uL (ref 4.0–10.5)
nRBC: 0 % (ref 0.0–0.2)

## 2018-08-13 LAB — APTT: APTT: 33 s (ref 24–36)

## 2018-08-13 LAB — GLUCOSE, CAPILLARY: Glucose-Capillary: 83 mg/dL (ref 70–99)

## 2018-08-13 LAB — TROPONIN I: Troponin I: 0.03 ng/mL (ref <0.03)

## 2018-08-13 MED ORDER — SODIUM CHLORIDE 0.9% FLUSH
3.0000 mL | Freq: Once | INTRAVENOUS | Status: AC
  Administered 2018-08-13: 3 mL via INTRAVENOUS

## 2018-08-13 MED ORDER — IOHEXOL 350 MG/ML SOLN
100.0000 mL | Freq: Once | INTRAVENOUS | Status: AC | PRN
  Administered 2018-08-13: 100 mL via INTRAVENOUS

## 2018-08-13 NOTE — ED Notes (Signed)
Pt assisted with bedpan at this time. Peri-care performed. Pt cleaned and repositioned in bed.

## 2018-08-13 NOTE — ED Notes (Signed)
Dr. Joni Fears made aware of troponin of 0.03. Dr. Joni Fears to bedside at this time

## 2018-08-13 NOTE — ED Provider Notes (Signed)
Patient with CTA without LVO but positive for infarct.  Will be admitted to [facility].  Signed out to Dr. Jannifer Franklin.   Veronica Pyo, MD 08/13/18 [phone]

## 2018-08-13 NOTE — H&P (Signed)
[facility] NAME: Veronica Wang    MR#:  [id number]  DATE OF BIRTH:  [birth year]  DATE OF ADMISSION:  08/13/2018  PRIMARY CARE PHYSICIAN: Adin Hector, MD   REQUESTING/REFERRING PHYSICIAN: Joni Fears, MD  CHIEF COMPLAINT:  Aphasia  HISTORY OF PRESENT ILLNESS:  Veronica Wang  is a 83 y.o. female who presents with chief complaint as above.  Patient presents with an episode of aphasia, confusion, headache.  Around 12:30-1:00 PM she developed the symptoms.  She came to ED for evaluation and CTA head showed left-sided infarct.  Hospitalist were called for admission  PAST MEDICAL HISTORY:   Past Medical History:  Diagnosis Date  . Abnormal MRI 09/10/2015  . Atrial fibrillation ([place])   . Cancer ([place])    Liver  . Cancer ([place])    stomach  . Carcinoid tumor of intestine   . CHF (congestive heart failure) ([place])   . COPD (chronic obstructive pulmonary disease) ([place])   . FH: chemotherapy   . GERD (gastroesophageal reflux disease)   . Hyperlipidemia   . Hypertension   . Macular degeneration   . Polycythemia   . Radiation      PAST SURGICAL HISTORY:   Past Surgical History:  Procedure Laterality Date  . CATARACT EXTRACTION  splenectomy  . COLON RESECTION    . COLON SURGERY    . GASTRECTOMY    . SPLENECTOMY       SOCIAL HISTORY:   Social History   Tobacco Use  . Smoking status: Former Smoker    Types: Cigarettes  . Smokeless tobacco: Never Used  Substance Use Topics  . Alcohol use: No    Alcohol/week: 0.0 standard drinks     FAMILY HISTORY:   Family History  Problem Relation Age of Onset  . Heart attack Sister   . Heart attack Brother   . Leukemia Mother     Family history reviewed and is non-contributory DRUG ALLERGIES:   Allergies  Allergen Reactions  . No Known Allergies     MEDICATIONS AT HOME:   Prior to Admission medications   Medication Sig Start Date End Date Taking? Authorizing Provider   acetaminophen (TYLENOL) 325 MG tablet Take 2 tablets (650 mg total) by mouth every 6 (six) hours as needed for mild pain (or Fever >/= 101). 12/15/14  Yes Tama High III, MD  albuterol (PROVENTIL HFA;VENTOLIN HFA) 108 (90 BASE) MCG/ACT inhaler Inhale 2 puffs into the lungs every 6 (six) hours as needed for wheezing or shortness of breath.   Yes [provider]  aspirin EC 81 MG tablet Take 81 mg by mouth daily.    Yes [provider]  Cholecalciferol (VITAMIN D-1000 MAX ST) 1000 UNITS tablet Take 1,000 Units by mouth daily.    Yes [provider]  Cyanocobalamin (VITAMIN B-12 CR) 1000 MCG TBCR Take 1 tablet by mouth daily.  03/02/11  Yes [provider]  lisinopril (PRINIVIL,ZESTRIL) 5 MG tablet Take 5 mg by mouth daily. 06/18/18  Yes [provider]  loperamide (IMODIUM A-D) 2 MG tablet Take 4 mg by mouth 4 (four) times daily as needed for diarrhea or loose stools.   Yes [provider]  Multiple Vitamins-Minerals (CENTRUM SILVER) tablet Take 1 tablet by mouth daily.  03/02/11  Yes [provider]  ondansetron (ZOFRAN) 8 MG tablet Take 1 tablet (8 mg total) by mouth every 8 (eight) hours as needed for nausea or vomiting. 09/17/17  Yes Jacquelin Hawking, NP  prochlorperazine (COMPAZINE) 10 MG tablet Take 1 tablet (10 mg total) by mouth every 6 (six) hours as needed for nausea or vomiting. 09/17/17  Yes Burns, Wandra Feinstein, NP  Telotristat Etiprate 250 MG TABS Take 1 tablet by mouth 3 (three) times daily. 05/27/18  Yes [provider]  famotidine (PEPCID) 20 MG tablet Take 20 mg by mouth daily.     [provider]  feeding supplement (BOOST HIGH PROTEIN) LIQD Take 1 Container by mouth 3 (three) times daily between meals.    [provider]  furosemide (LASIX) 20 MG tablet Take 20 mg by mouth daily.  02/22/15   [provider]  lidocaine (XYLOCAINE) 2 % solution Use as directed 15 mLs in the mouth or throat  every 6 (six) hours as needed (chest/stomach pain). Patient not taking: Reported on 08/13/2018 07/18/18   Nance Pear, MD  potassium chloride SA (K-DUR,KLOR-CON) 20 MEQ tablet Take 2 tablets (40 mEq total) by mouth 2 (two) times daily. Patient not taking: Reported on 01/03/2018 11/23/17   Lloyd Huger, MD  sucralfate (CARAFATE) 1 g tablet Take 1 tablet (1 g total) by mouth 4 (four) times daily. Patient not taking: Reported on 08/13/2018 07/18/18   Nance Pear, MD    REVIEW OF SYSTEMS:  Review of Systems  Constitutional: Negative for chills, fever, malaise/fatigue and weight loss.  HENT: Negative for ear pain, hearing loss and tinnitus.   Eyes: Negative for blurred vision, double vision, pain and redness.  Respiratory: Negative for cough, hemoptysis and shortness of breath.   Cardiovascular: Negative for chest pain, palpitations, orthopnea and leg swelling.  Gastrointestinal: Negative for abdominal pain, constipation, diarrhea, nausea and vomiting.  Genitourinary: Negative for dysuria, frequency and hematuria.  Musculoskeletal: Negative for back pain, joint pain and neck pain.  Skin:       No acne, rash, or lesions  Neurological: Positive for speech change and headaches. Negative for dizziness, tremors, focal weakness and weakness.  Endo/Heme/Allergies: Negative for polydipsia. Does not bruise/bleed easily.  Psychiatric/Behavioral: Negative for depression. The patient is not nervous/anxious and does not have insomnia.      VITAL SIGNS:   Vitals:   08/13/18 2100 08/13/18 2200 08/13/18 2230 08/13/18 2300  BP: (!) 149/81 (!) 151/85 (!) 151/78 (!) 145/79  Pulse: 69 73 80 73  Resp: 20 19 (!) 21 20  Temp:      TempSrc:      SpO2: 98% 98% 98% 97%   Wt Readings from Last 3 Encounters:  07/18/18 31.3 kg  04/25/18 30.8 kg  04/12/18 31 kg    PHYSICAL EXAMINATION:  Physical Exam  Vitals reviewed. Constitutional: She is oriented to person, place, and time. She appears  well-developed and well-nourished. No distress.  HENT:  Head: Normocephalic and atraumatic.  Mouth/Throat: Oropharynx is clear and moist.  Eyes: Pupils are equal, round, and reactive to light. Conjunctivae and EOM are normal. No scleral icterus.  Neck: Normal range of motion. Neck supple. No JVD present. No thyromegaly present.  Cardiovascular: Normal rate, regular rhythm and intact distal pulses. Exam reveals no gallop and no friction rub.  No murmur heard. Respiratory: Effort normal and breath sounds normal. No respiratory distress. She has no wheezes. She has no rales.  GI: Soft. Bowel sounds are normal. She exhibits no distension. There is no abdominal tenderness.  Musculoskeletal: Normal range of motion.        General: No edema.     Comments: No arthritis,  no gout  Lymphadenopathy:    She has no cervical adenopathy.  Neurological: She is alert and oriented to person, place, and time. No cranial nerve deficit.  Neurologic: Cranial nerves II-XII intact, Sensation intact to light touch/pinprick, 5/5 strength in all extremities, no dysarthria, mild aphasia - stuttering and slowed speech, no dysphagia, memory intact, finger to nose testing showed no abnormality, no pronator drift  Skin: Skin is warm and dry. No rash noted. No erythema.  Psychiatric: She has a normal mood and affect. Her behavior is normal. Judgment and thought content normal.    LABORATORY PANEL:   CBC Recent Labs  Lab 08/13/18 1813  WBC 10.0  HGB 13.8  HCT 43.2  PLT 300   ------------------------------------------------------------------------------------------------------------------  Chemistries  Recent Labs  Lab 08/13/18 1813  NA 142  K 2.9*  CL 105  CO2 30  GLUCOSE 94  BUN 14  CREATININE 0.67  CALCIUM 8.8*  AST 27  ALT 16  ALKPHOS 107  BILITOT 0.5   ------------------------------------------------------------------------------------------------------------------  Cardiac Enzymes Recent Labs   Lab 08/13/18 1813  TROPONINI 0.03*   ------------------------------------------------------------------------------------------------------------------  RADIOLOGY:  Ct Angio Head W Or Wo Contrast  Result Date: 08/13/2018 EXAM: CT ANGIOGRAPHY HEAD AND NECK CT PERFUSION BRAIN TECHNIQUE: Multidetector CT imaging of the head and neck was performed using the standard protocol during bolus administration of intravenous contrast. Multiplanar CT image reconstructions and MIPs were obtained to evaluate the vascular anatomy. Carotid stenosis measurements (when applicable) are obtained utilizing NASCET criteria, using the distal internal carotid diameter as the denominator. Multiphase CT imaging of the brain was performed following IV bolus contrast injection. Subsequent parametric perfusion maps were calculated using RAPID software. CONTRAST:  121mL OMNIPAQUE IOHEXOL 350 MG/ML SOLN COMPARISON:  Prior noncontrast head CT from earlier the same day. FINDINGS: CTA NECK FINDINGS Aortic arch: Visualized aortic arch of normal caliber with normal 3 vessel morphology. Moderate atherosclerotic change about the arch itself. No hemodynamically significant stenosis about the origin of the great vessels. Visualized subclavian arteries widely patent. Right carotid system: Right common carotid artery tortuous proximally but widely patent to the bifurcation. Minimal atheromatous irregularity about the right bifurcation without significant stenosis. Right ICA tortuous but widely patent distally to the skull base without stenosis, dissection, or occlusion. Left carotid system: Left common carotid artery tortuous proximally but widely patent to the bifurcation without stenosis. Minimal atheromatous plaque about the left bifurcation without significant stenosis. Left ICA mildly tortuous but widely patent distally to the skull base without stenosis, dissection, or occlusion. Vertebral arteries: Both of the vertebral arteries arise from  the subclavian arteries. Vertebral arteries mildly tortuous proximally but widely patent without stenosis, dissection, or occlusion. Skeleton: No acute osseous abnormality. No discrete lytic or blastic osseous lesions. Patient is edentulous. Other neck: No other acute abnormality within the neck. Upper chest: Visualized lungs are hyperexpanded with severe emphysematous changes. 6 mm spiculated nodule present at the left lung apex (series 5, image 34). Review of the MIP images confirms the above findings CTA HEAD FINDINGS Anterior circulation: Petrous segments widely patent bilaterally. Mild scattered atheromatous plaque within the cavernous/supraclinoid ICAs without significant stenosis. ICA termini widely patent. A1 segments well perfused bilaterally. Normal anterior communicating artery. Anterior cerebral arteries widely patent to their distal aspects. M1 segments widely patent bilaterally. No proximal M2 occlusion. Distal MCA branches well perfused and fairly symmetric. Posterior circulation: Vertebral arteries patent to the vertebrobasilar junction without stenosis. Left vertebral artery slightly dominant. Posterior inferior cerebral arteries patent bilaterally. Basilar widely patent  to its distal aspect. Superior cerebral arteries patent bilaterally. Left PCA supplied via the basilar. Fetal type origin of the right PCA supplied via a widely patent right posterior communicating artery. PCAs widely patent to their distal aspects without stenosis. Venous sinuses: Patent. Anatomic variants: Fetal type right PCA.  No intracranial aneurysm. Delayed phase: No abnormal enhancement. Evolving left posterior MCA cortical infarct noted (series 13, image 15). Review of the MIP images confirms the above findings CT Brain Perfusion Findings: CBF (<30%) Volume: 44mL Perfusion (Tmax>6.0s) volume: 65mL Mismatch Volume: -70mL Infarction Location:Small acute cortical infarct involving the posterior left MCA distribution at the  posterior left frontal lobe. No surrounding perfusion deficit or penumbra. IMPRESSION: 1. Negative CTA for large vessel occlusion, with wide patency of the major arterial vasculature of the head and neck. 2. 3 mL core infarct at the posterior left frontotemporal region, posterior left MCA territory. No surrounding perfusion deficit or penumbra. 3. Mild atherosclerotic change for patient age without hemodynamically significant stenosis within the head and neck. 4. Severe emphysema. 5. **An incidental finding of potential clinical significance has been found. 6 mm spiculated nodule at the left lung apex, indeterminate. Non-contrast chest CT at 6-12 months is recommended. If the nodule is stable at time of repeat CT, then future CT at 18-24 months (from today's scan) is considered optional for low-risk patients, but is recommended for high-risk patients. This recommendation follows the consensus statement: Guidelines for Management of Incidental Pulmonary Nodules Detected on CT Images: From the Fleischner Society 2017; Radiology 2017; 284:228-243.** Critical Value/emergent results were called by telephone at the time of interpretation on 08/13/2018 at 9:50 pm to Dr. Clearnce Hasten, who verbally acknowledged these results. Electronically Signed   By: Jeannine Boga M.D.   On: 08/13/2018 22:11   Ct Head Wo Contrast  Result Date: 08/13/2018 CLINICAL DATA:  83 year old female with acute altered mental status and aphasia today. EXAM: CT HEAD WITHOUT CONTRAST TECHNIQUE: Contiguous axial images were obtained from the base of the skull through the vertex without intravenous contrast. COMPARISON:  06/02/2015 head CT FINDINGS: Brain: No evidence of acute infarction, hemorrhage, hydrocephalus, extra-axial collection or mass lesion/mass effect. Atrophy and chronic small-vessel white matter ischemic changes again noted. Vascular: Carotid atherosclerotic calcifications again identified. Skull: Normal. Negative for fracture or  focal lesion. Sinuses/Orbits: No acute finding. Other: None. IMPRESSION: 1. No evidence of acute intracranial abnormality. 2. Atrophy and chronic small-vessel white matter ischemic changes. Electronically Signed   By: Margarette Canada M.D.   On: 08/13/2018 18:55   Ct Angio Neck W Or Wo Contrast  Result Date: 08/13/2018 EXAM: CT ANGIOGRAPHY HEAD AND NECK CT PERFUSION BRAIN TECHNIQUE: Multidetector CT imaging of the head and neck was performed using the standard protocol during bolus administration of intravenous contrast. Multiplanar CT image reconstructions and MIPs were obtained to evaluate the vascular anatomy. Carotid stenosis measurements (when applicable) are obtained utilizing NASCET criteria, using the distal internal carotid diameter as the denominator. Multiphase CT imaging of the brain was performed following IV bolus contrast injection. Subsequent parametric perfusion maps were calculated using RAPID software. CONTRAST:  136mL OMNIPAQUE IOHEXOL 350 MG/ML SOLN COMPARISON:  Prior noncontrast head CT from earlier the same day. FINDINGS: CTA NECK FINDINGS Aortic arch: Visualized aortic arch of normal caliber with normal 3 vessel morphology. Moderate atherosclerotic change about the arch itself. No hemodynamically significant stenosis about the origin of the great vessels. Visualized subclavian arteries widely patent. Right carotid system: Right common carotid artery tortuous proximally but widely patent to the  bifurcation. Minimal atheromatous irregularity about the right bifurcation without significant stenosis. Right ICA tortuous but widely patent distally to the skull base without stenosis, dissection, or occlusion. Left carotid system: Left common carotid artery tortuous proximally but widely patent to the bifurcation without stenosis. Minimal atheromatous plaque about the left bifurcation without significant stenosis. Left ICA mildly tortuous but widely patent distally to the skull base without stenosis,  dissection, or occlusion. Vertebral arteries: Both of the vertebral arteries arise from the subclavian arteries. Vertebral arteries mildly tortuous proximally but widely patent without stenosis, dissection, or occlusion. Skeleton: No acute osseous abnormality. No discrete lytic or blastic osseous lesions. Patient is edentulous. Other neck: No other acute abnormality within the neck. Upper chest: Visualized lungs are hyperexpanded with severe emphysematous changes. 6 mm spiculated nodule present at the left lung apex (series 5, image 34). Review of the MIP images confirms the above findings CTA HEAD FINDINGS Anterior circulation: Petrous segments widely patent bilaterally. Mild scattered atheromatous plaque within the cavernous/supraclinoid ICAs without significant stenosis. ICA termini widely patent. A1 segments well perfused bilaterally. Normal anterior communicating artery. Anterior cerebral arteries widely patent to their distal aspects. M1 segments widely patent bilaterally. No proximal M2 occlusion. Distal MCA branches well perfused and fairly symmetric. Posterior circulation: Vertebral arteries patent to the vertebrobasilar junction without stenosis. Left vertebral artery slightly dominant. Posterior inferior cerebral arteries patent bilaterally. Basilar widely patent to its distal aspect. Superior cerebral arteries patent bilaterally. Left PCA supplied via the basilar. Fetal type origin of the right PCA supplied via a widely patent right posterior communicating artery. PCAs widely patent to their distal aspects without stenosis. Venous sinuses: Patent. Anatomic variants: Fetal type right PCA.  No intracranial aneurysm. Delayed phase: No abnormal enhancement. Evolving left posterior MCA cortical infarct noted (series 13, image 15). Review of the MIP images confirms the above findings CT Brain Perfusion Findings: CBF (<30%) Volume: 42mL Perfusion (Tmax>6.0s) volume: 11mL Mismatch Volume: -68mL Infarction  Location:Small acute cortical infarct involving the posterior left MCA distribution at the posterior left frontal lobe. No surrounding perfusion deficit or penumbra. IMPRESSION: 1. Negative CTA for large vessel occlusion, with wide patency of the major arterial vasculature of the head and neck. 2. 3 mL core infarct at the posterior left frontotemporal region, posterior left MCA territory. No surrounding perfusion deficit or penumbra. 3. Mild atherosclerotic change for patient age without hemodynamically significant stenosis within the head and neck. 4. Severe emphysema. 5. **An incidental finding of potential clinical significance has been found. 6 mm spiculated nodule at the left lung apex, indeterminate. Non-contrast chest CT at 6-12 months is recommended. If the nodule is stable at time of repeat CT, then future CT at 18-24 months (from today's scan) is considered optional for low-risk patients, but is recommended for high-risk patients. This recommendation follows the consensus statement: Guidelines for Management of Incidental Pulmonary Nodules Detected on CT Images: From the Fleischner Society 2017; Radiology 2017; 284:228-243.** Critical Value/emergent results were called by telephone at the time of interpretation on 08/13/2018 at 9:50 pm to Dr. Clearnce Hasten, who verbally acknowledged these results. Electronically Signed   By: Jeannine Boga M.D.   On: 08/13/2018 22:11   Ct Cerebral Perfusion W Contrast  Result Date: 08/13/2018 EXAM: CT ANGIOGRAPHY HEAD AND NECK CT PERFUSION BRAIN TECHNIQUE: Multidetector CT imaging of the head and neck was performed using the standard protocol during bolus administration of intravenous contrast. Multiplanar CT image reconstructions and MIPs were obtained to evaluate the vascular anatomy. Carotid stenosis measurements (when applicable)  are obtained utilizing NASCET criteria, using the distal internal carotid diameter as the denominator. Multiphase CT imaging of the brain  was performed following IV bolus contrast injection. Subsequent parametric perfusion maps were calculated using RAPID software. CONTRAST:  128mL OMNIPAQUE IOHEXOL 350 MG/ML SOLN COMPARISON:  Prior noncontrast head CT from earlier the same day. FINDINGS: CTA NECK FINDINGS Aortic arch: Visualized aortic arch of normal caliber with normal 3 vessel morphology. Moderate atherosclerotic change about the arch itself. No hemodynamically significant stenosis about the origin of the great vessels. Visualized subclavian arteries widely patent. Right carotid system: Right common carotid artery tortuous proximally but widely patent to the bifurcation. Minimal atheromatous irregularity about the right bifurcation without significant stenosis. Right ICA tortuous but widely patent distally to the skull base without stenosis, dissection, or occlusion. Left carotid system: Left common carotid artery tortuous proximally but widely patent to the bifurcation without stenosis. Minimal atheromatous plaque about the left bifurcation without significant stenosis. Left ICA mildly tortuous but widely patent distally to the skull base without stenosis, dissection, or occlusion. Vertebral arteries: Both of the vertebral arteries arise from the subclavian arteries. Vertebral arteries mildly tortuous proximally but widely patent without stenosis, dissection, or occlusion. Skeleton: No acute osseous abnormality. No discrete lytic or blastic osseous lesions. Patient is edentulous. Other neck: No other acute abnormality within the neck. Upper chest: Visualized lungs are hyperexpanded with severe emphysematous changes. 6 mm spiculated nodule present at the left lung apex (series 5, image 34). Review of the MIP images confirms the above findings CTA HEAD FINDINGS Anterior circulation: Petrous segments widely patent bilaterally. Mild scattered atheromatous plaque within the cavernous/supraclinoid ICAs without significant stenosis. ICA termini widely  patent. A1 segments well perfused bilaterally. Normal anterior communicating artery. Anterior cerebral arteries widely patent to their distal aspects. M1 segments widely patent bilaterally. No proximal M2 occlusion. Distal MCA branches well perfused and fairly symmetric. Posterior circulation: Vertebral arteries patent to the vertebrobasilar junction without stenosis. Left vertebral artery slightly dominant. Posterior inferior cerebral arteries patent bilaterally. Basilar widely patent to its distal aspect. Superior cerebral arteries patent bilaterally. Left PCA supplied via the basilar. Fetal type origin of the right PCA supplied via a widely patent right posterior communicating artery. PCAs widely patent to their distal aspects without stenosis. Venous sinuses: Patent. Anatomic variants: Fetal type right PCA.  No intracranial aneurysm. Delayed phase: No abnormal enhancement. Evolving left posterior MCA cortical infarct noted (series 13, image 15). Review of the MIP images confirms the above findings CT Brain Perfusion Findings: CBF (<30%) Volume: 17mL Perfusion (Tmax>6.0s) volume: 87mL Mismatch Volume: -26mL Infarction Location:Small acute cortical infarct involving the posterior left MCA distribution at the posterior left frontal lobe. No surrounding perfusion deficit or penumbra. IMPRESSION: 1. Negative CTA for large vessel occlusion, with wide patency of the major arterial vasculature of the head and neck. 2. 3 mL core infarct at the posterior left frontotemporal region, posterior left MCA territory. No surrounding perfusion deficit or penumbra. 3. Mild atherosclerotic change for patient age without hemodynamically significant stenosis within the head and neck. 4. Severe emphysema. 5. **An incidental finding of potential clinical significance has been found. 6 mm spiculated nodule at the left lung apex, indeterminate. Non-contrast chest CT at 6-12 months is recommended. If the nodule is stable at time of repeat  CT, then future CT at 18-24 months (from today's scan) is considered optional for low-risk patients, but is recommended for high-risk patients. This recommendation follows the consensus statement: Guidelines for Management of Incidental Pulmonary Nodules  Detected on CT Images: From the Fleischner Society 2017; Radiology 2017; [id number].** Critical Value/emergent results were called by telephone at the time of interpretation on 08/13/2018 at 9:50 pm to Dr. Clearnce Hasten, who verbally acknowledged these results. Electronically Signed   By: Jeannine Boga M.D.   On: 08/13/2018 22:11    EKG:   Orders placed or performed during the hospital encounter of 08/13/18  . EKG 12-Lead  . EKG 12-Lead  . ED EKG  . ED EKG    IMPRESSION AND PLAN:  Principal Problem:   Stroke [facility]) -left-sided frontotemporal stroke seen on CTA head imaging.  Will admit per stroke admission order set with appropriate further imaging, labs, and consults Active Problems:   Chronic combined systolic (congestive) and diastolic (congestive) heart failure (HCC) -continue home medications   PAF (paroxysmal atrial fibrillation) ([place]) -continue home dose rate controlling medications   HTN (hypertension) -permissive hypertension for the first 24 hours, blood pressure goal less than 220/120, hold antihypertensives for now   HLD (hyperlipidemia) -continue home dose antilipid  Chart review performed and case discussed with ED provider. Labs, imaging and/or ECG reviewed by provider and discussed with patient/family. Management plans discussed with the patient and/or family.  DVT PROPHYLAXIS: SubQ lovenox   GI PROPHYLAXIS:  None  ADMISSION STATUS: Inpatient     CODE STATUS: Full Code Status History    Date Active Date Inactive Code Status Order ID Comments User Context   12/15/2014 1933 01/06/2015 1806 Full Code [id number]  Corey Harold Inpatient   12/05/2014 0340 12/15/2014 1933 Full Code [id number]  Juluis Mire, MD  Inpatient      TOTAL TIME TAKING CARE OF THIS PATIENT: 45 minutes.   Ethlyn Daniels 08/13/2018, 11:52 PM  [facility]  Office  [phone]  CC: Primary care physician; Adin Hector, MD  Note:  This document was prepared using Dragon voice recognition software and may include unintentional dictation errors.

## 2018-08-13 NOTE — ED Notes (Signed)
Pt and family made aware to hit call bell if neurology calls

## 2018-08-13 NOTE — ED Notes (Signed)
Called teleneuro for follow up @2155 ,  [phone]

## 2018-08-13 NOTE — ED Triage Notes (Signed)
Per daughter pt started having AMS and aphasia this afternoon, last know well was 1300. PT c/o headache, pt alert but confused. Pt is A&OX4 at baseline.

## 2018-08-13 NOTE — ED Notes (Signed)
Per Jonelle Sidle, tele will call hospital before contacting us via tele neuro cart

## 2018-08-13 NOTE — ED Provider Notes (Signed)
[facility] Emergency Department Provider Note  ____________________________________________  Time seen: Approximately 8:02 PM  I have reviewed the triage vital signs and the nursing notes.   HISTORY  Chief Complaint Altered mental status   Level 5 Caveat: Portions of the History and Physical including HPI and review of systems are unable to be completely obtained due to patient being a poor historian   HPI Fara Worthy is a 83 y.o. female with a history of atrial fibrillation, liver cancer, carcinoid tumor of intestine, CHF COPD hypertension who is brought to the ED today due to memory loss and difficulty speaking that started at about 11:00 AM today.  Daughter-in-law spoke with the patient at about 1030, and by the time the patient had been picked up by some family members to go visit a sick relative in the hospital at 1 PM, the patient was already behaving abnormally and having difficulty expressing herself clearly.  According to the daughter, symptoms are characterized primarily by difficulty completing a sentence in a coherent fashion or using the words that she wants to say.  Patient also has told the daughter that she does know who she is, but cannot remember her name or say it.      Past Medical History:  Diagnosis Date  . Abnormal MRI 09/10/2015  . Atrial fibrillation ([place])   . Cancer ([place])    Liver  . Cancer ([place])    stomach  . Carcinoid tumor of intestine   . CHF (congestive heart failure) ([place])   . COPD (chronic obstructive pulmonary disease) ([place])   . FH: chemotherapy   . GERD (gastroesophageal reflux disease)   . Hyperlipidemia   . Hypertension   . Macular degeneration   . Polycythemia   . Radiation      Patient Active Problem List   Diagnosis Date Noted  . Chronic diastolic heart failure ([place]) 11/27/2017  . Atrial fibrillation ([place]) 11/27/2017  . Abnormal MRI 09/10/2015  . Malnutrition of moderate degree ([place]) 12/06/2014  .  Pneumonia 12/05/2014  . COPD (chronic obstructive pulmonary disease) ([place]) 12/05/2014  . Carcinoid tumor of intestine 11/20/2014     Past Surgical History:  Procedure Laterality Date  . CATARACT EXTRACTION  splenectomy  . COLON RESECTION    . COLON SURGERY    . GASTRECTOMY    . SPLENECTOMY       Prior to Admission medications   Medication Sig Start Date End Date Taking? Authorizing Provider  acetaminophen (TYLENOL) 325 MG tablet Take 2 tablets (650 mg total) by mouth every 6 (six) hours as needed for mild pain (or Fever >/= 101). 12/15/14   Tama High III, MD  albuterol (PROVENTIL HFA;VENTOLIN HFA) 108 (90 BASE) MCG/ACT inhaler Inhale 2 puffs into the lungs every 6 (six) hours as needed for wheezing or shortness of breath.    [provider]  aspirin EC 81 MG tablet Take 81 mg by mouth daily.     [provider]  Cholecalciferol (VITAMIN D-1000 MAX ST) 1000 UNITS tablet Take 1,000 Units by mouth daily.     [provider]  Cyanocobalamin (VITAMIN B-12 CR) 1000 MCG TBCR Take 1 tablet by mouth daily.  03/02/11   [provider]  famotidine (PEPCID) 20 MG tablet Take by mouth.    [provider]  feeding supplement (BOOST HIGH PROTEIN) LIQD Take 1 Container by mouth 3 (three) times daily between meals.    [provider]  furosemide (LASIX) 20 MG tablet  Take 20 mg by mouth daily.  02/22/15   [provider]  lidocaine (XYLOCAINE) 2 % solution Use as directed 15 mLs in the mouth or throat every 6 (six) hours as needed (chest/stomach pain). 07/18/18   Nance Pear, MD  loperamide (IMODIUM A-D) 2 MG tablet Take 4 mg by mouth 4 (four) times daily as needed for diarrhea or loose stools.    [provider]  Multiple Vitamins-Minerals (CENTRUM SILVER) tablet Take 1 tablet by mouth daily.  03/02/11   [provider]  ondansetron (ZOFRAN) 8 MG tablet Take 1 tablet (8 mg total) by mouth every 8 (eight) hours as needed  for nausea or vomiting. 09/17/17   Jacquelin Hawking, NP  potassium chloride SA (K-DUR,KLOR-CON) 20 MEQ tablet Take 2 tablets (40 mEq total) by mouth 2 (two) times daily. Patient not taking: Reported on 01/03/2018 11/23/17   Lloyd Huger, MD  prochlorperazine (COMPAZINE) 10 MG tablet Take 1 tablet (10 mg total) by mouth every 6 (six) hours as needed for nausea or vomiting. 09/17/17   Jacquelin Hawking, NP  sucralfate (CARAFATE) 1 g tablet Take 1 tablet (1 g total) by mouth 4 (four) times daily. 07/18/18   Nance Pear, MD     Allergies No known allergies   Family History  Problem Relation Age of Onset  . Heart attack Sister   . Heart attack Brother   . Leukemia Mother     Social History Social History   Tobacco Use  . Smoking status: Former Smoker    Types: Cigarettes  . Smokeless tobacco: Never Used  Substance Use Topics  . Alcohol use: No    Alcohol/week: 0.0 standard drinks  . Drug use: No    Review of Systems Level 5 Caveat: Portions of the History and Physical including HPI and review of systems are unable to be completely obtained due to patient being a poor historian   Constitutional:   No known fever.  ENT:   No rhinorrhea. Cardiovascular:   No chest pain or syncope. Respiratory:   No dyspnea or cough. Gastrointestinal:   Negative for abdominal pain, vomiting and diarrhea.  Musculoskeletal:   Negative for focal pain or swelling ____________________________________________   PHYSICAL EXAM:  VITAL SIGNS: ED Triage Vitals [08/13/18 1809]  Enc Vitals Group     BP (!) 150/77     Pulse Rate 93     Resp 16     Temp 98.2 F (36.8 C)     Temp Source Oral     SpO2 97 %     Weight      Height      Head Circumference      Peak Flow      Pain Score      Pain Loc      Pain Edu?      Excl. in [place]?     Vital signs reviewed, nursing assessments reviewed.   Constitutional:   Awake, alert, oriented to self.  Chronically ill-appearing.  Cachectic Eyes:    Conjunctivae are normal. EOMI. PERRL. ENT      Head:   Normocephalic and atraumatic.      Nose:   No congestion/rhinnorhea.       Mouth/Throat:   Dry mucous membranes, no pharyngeal erythema. No peritonsillar mass.       Neck:   No meningismus. Full ROM. Hematological/Lymphatic/Immunilogical:   No cervical lymphadenopathy. Cardiovascular:   Irregularly irregular rhythm, rate controlled at 80. Symmetric bilateral radial and DP  pulses.  No murmurs. Cap refill less than 2 seconds. Respiratory:   Normal respiratory effort without tachypnea/retractions. Breath sounds are clear and equal bilaterally. No wheezes/rales/rhonchi. Gastrointestinal:   Soft and nontender. Non distended. There is no CVA tenderness.  No rebound, rigidity, or guarding. Musculoskeletal:   Normal range of motion in all extremities. No joint effusions.  No lower extremity tenderness.  No edema. Neurologic:   Normal speech, nonfluent language, difficulty with word finding and naming family members in the room. Motor grossly intact. Sensation intact NIH stroke scale 2 Skin:    Skin is warm, dry and intact. No rash noted.  No petechiae, purpura, or bullae.  ____________________________________________    LABS (pertinent positives/negatives) (all labs ordered are listed, but only abnormal results are displayed) Labs Reviewed  CBC - Abnormal; Notable for the following components:      Result Value   RBC 5.25 (*)    RDW 15.9 (*)    All other components within normal limits  DIFFERENTIAL - Abnormal; Notable for the following components:   Abs Immature Granulocytes 0.15 (*)    All other components within normal limits  COMPREHENSIVE METABOLIC PANEL - Abnormal; Notable for the following components:   Potassium 2.9 (*)    Calcium 8.8 (*)    Total Protein 6.3 (*)    Albumin 3.3 (*)    All other components within normal limits  TROPONIN I - Abnormal; Notable for the following components:   Troponin I 0.03 (*)    All other  components within normal limits  PROTIME-INR  APTT  GLUCOSE, CAPILLARY  CBG MONITORING, ED   ____________________________________________   EKG  Interpreted by me Sinus rhythm rate of 93, right axis, normal intervals.  Poor R wave progression.  Slight lateral T wave inversion and ST depression.  Unchanged compared to July 20, 2018, likely related to LVH.  ____________________________________________    RADIOLOGY  Ct Head Wo Contrast  Result Date: 08/13/2018 CLINICAL DATA:  83 year old female with acute altered mental status and aphasia today. EXAM: CT HEAD WITHOUT CONTRAST TECHNIQUE: Contiguous axial images were obtained from the base of the skull through the vertex without intravenous contrast. COMPARISON:  06/02/2015 head CT FINDINGS: Brain: No evidence of acute infarction, hemorrhage, hydrocephalus, extra-axial collection or mass lesion/mass effect. Atrophy and chronic small-vessel white matter ischemic changes again noted. Vascular: Carotid atherosclerotic calcifications again identified. Skull: Normal. Negative for fracture or focal lesion. Sinuses/Orbits: No acute finding. Other: None. IMPRESSION: 1. No evidence of acute intracranial abnormality. 2. Atrophy and chronic small-vessel white matter ischemic changes. Electronically Signed   By: Margarette Canada M.D.   On: 08/13/2018 18:55    ____________________________________________   PROCEDURES Procedures  ____________________________________________  DIFFERENTIAL DIAGNOSIS   Acute ischemic stroke, electrolyte disturbance, delirium  CLINICAL IMPRESSION / ASSESSMENT AND PLAN / ED COURSE  Pertinent labs & imaging results that were available during my care of the patient were reviewed by me and considered in my medical decision making (see chart for details).    Patient presents with symptoms concerning for acute stroke, predominantly evidenced by aphasia.  Stat tele-neurology consult ordered to further assess necessity of CT  angiogram to evaluate for LVO.  Otherwise would plan to admit to [facility] for stroke work-up.  After neurology consult will plan for swallow screen and aspirin.  Clinical Course as of Aug 13 2037  Tue Aug 13, 2018  2017 Discussed with tele-neurologist by phone who recommends to go ahead and get the CT angiogram and perfusion  study given the aphasia symptoms and reconsult if positive.   [PS]    Clinical Course User Index [PS] Carrie Mew, MD     ____________________________________________   FINAL CLINICAL IMPRESSION(S) / ED DIAGNOSES    Final diagnoses:  Aphasia due to acute stroke [facility])     ED Discharge Orders    None      Portions of this note were generated with dragon dictation software. Dictation errors may occur despite best attempts at proofreading.   Carrie Mew, MD 08/13/18 2039

## 2018-08-14 ENCOUNTER — Inpatient Hospital Stay: Payer: Medicare Other

## 2018-08-14 ENCOUNTER — Other Ambulatory Visit: Payer: Self-pay

## 2018-08-14 ENCOUNTER — Inpatient Hospital Stay
Admit: 2018-08-14 | Discharge: 2018-08-14 | Disposition: A | Discharge status: Home / Self Care | Payer: Medicare Other | Attending: Internal Medicine | Admitting: Internal Medicine

## 2018-08-14 DIAGNOSIS — E43 Unspecified severe protein-calorie malnutrition: Secondary | ICD-10-CM

## 2018-08-14 LAB — ECHOCARDIOGRAM COMPLETE
Height: 62 in
WEIGHTICAEL: 1033.6 [oz_av]

## 2018-08-14 LAB — LIPID PANEL
Cholesterol: 158 mg/dL (ref 0–200)
HDL: 69 mg/dL (ref >40)
LDL Cholesterol: 71 mg/dL (ref 0–99)
Total CHOL/HDL Ratio: 2.3 RATIO
Triglycerides: 89 mg/dL (ref <150)
VLDL: 18 mg/dL (ref 0–40)

## 2018-08-14 LAB — HEMOGLOBIN A1C
Hgb A1c MFr Bld: 6 % — ABNORMAL HIGH (ref 4.8–5.6)
MEAN PLASMA GLUCOSE: 125.5 mg/dL

## 2018-08-14 LAB — CBC
HCT: 39.3 % (ref 36.0–46.0)
Hemoglobin: 12.8 g/dL (ref 12.0–15.0)
MCH: 26.8 pg (ref 26.0–34.0)
MCHC: 32.6 g/dL (ref 30.0–36.0)
MCV: 82.2 fL (ref 80.0–100.0)
NRBC: 0 % (ref 0.0–0.2)
Platelets: 262 10*3/uL (ref 150–400)
RBC: 4.78 MIL/uL (ref 3.87–5.11)
RDW: 15.9 % — AB (ref 11.5–15.5)
WBC: 10 10*3/uL (ref 4.0–10.5)

## 2018-08-14 LAB — CREATININE, SERUM
Creatinine, Ser: 0.57 mg/dL (ref 0.44–1.00)
GFR calc Af Amer: >60 mL/min (ref >60)
GFR calc non Af Amer: >60 mL/min (ref >60)

## 2018-08-14 LAB — POTASSIUM
Potassium: 2.9 mmol/L — ABNORMAL LOW (ref 3.5–5.1)
Potassium: 3.5 mmol/L (ref 3.5–5.1)

## 2018-08-14 LAB — MAGNESIUM: Magnesium: 2 mg/dL (ref 1.7–2.4)

## 2018-08-14 MED ORDER — ENOXAPARIN SODIUM 30 MG/0.3ML ~~LOC~~ SOLN
30.0000 mg | SUBCUTANEOUS | Status: DC
  Administered 2018-08-14: 30 mg via SUBCUTANEOUS
  Filled 2018-08-14: qty 0.3

## 2018-08-14 MED ORDER — TELOTRISTAT ETHYL(AS ETIPRATE) 250 MG PO TABS
250.0000 mg | ORAL_TABLET | Freq: Three times a day (TID) | ORAL | Status: DC
  Administered 2018-08-14: 18:00:00 250 mg via ORAL
  Filled 2018-08-14: qty 84

## 2018-08-14 MED ORDER — LOPERAMIDE HCL 2 MG PO CAPS: 4.0000 mg | ORAL_CAPSULE | Freq: Four times a day (QID) | ORAL | Status: DC | PRN

## 2018-08-14 MED ORDER — FAMOTIDINE 20 MG PO TABS
20.0000 mg | ORAL_TABLET | Freq: Every day | ORAL | Status: DC
  Administered 2018-08-14 – 2018-08-15 (×2): 20 mg via ORAL
  Filled 2018-08-14 (×2): qty 1

## 2018-08-14 MED ORDER — ACETAMINOPHEN 325 MG PO TABS: 650.0000 mg | ORAL_TABLET | ORAL | Status: DC | PRN

## 2018-08-14 MED ORDER — STROKE: EARLY STAGES OF RECOVERY BOOK
Freq: Once | Status: AC
  Administered 2018-08-14: 04:00:00

## 2018-08-14 MED ORDER — ADULT MULTIVITAMIN W/MINERALS CH
1.0000 | ORAL_TABLET | Freq: Every day | ORAL | Status: DC
  Administered 2018-08-14 – 2018-08-15 (×2): 1 via ORAL
  Filled 2018-08-14 (×2): qty 1

## 2018-08-14 MED ORDER — ASPIRIN EC 81 MG PO TBEC
81.0000 mg | DELAYED_RELEASE_TABLET | Freq: Every day | ORAL | Status: DC
  Administered 2018-08-14 – 2018-08-15 (×2): 81 mg via ORAL
  Filled 2018-08-14 (×2): qty 1

## 2018-08-14 MED ORDER — ENSURE ENLIVE PO LIQD
237.0000 mL | Freq: Two times a day (BID) | ORAL | Status: DC
  Administered 2018-08-15: 237 mL via ORAL

## 2018-08-14 MED ORDER — ACETAMINOPHEN 160 MG/5ML PO SOLN: 650.0000 mg | ORAL | Status: DC | PRN

## 2018-08-14 MED ORDER — POTASSIUM CHLORIDE 10 MEQ/100ML IV SOLN
10.0000 meq | INTRAVENOUS | Status: AC
  Administered 2018-08-14 (×4): 10 meq via INTRAVENOUS
  Filled 2018-08-14 (×4): qty 100

## 2018-08-14 MED ORDER — ATORVASTATIN CALCIUM 20 MG PO TABS
40.0000 mg | ORAL_TABLET | Freq: Every day | ORAL | Status: DC
  Administered 2018-08-14: 40 mg via ORAL
  Filled 2018-08-14: qty 2

## 2018-08-14 MED ORDER — ENOXAPARIN SODIUM 40 MG/0.4ML ~~LOC~~ SOLN: 40.0000 mg | SUBCUTANEOUS | Status: DC

## 2018-08-14 MED ORDER — ACETAMINOPHEN 650 MG RE SUPP: 650.0000 mg | RECTAL | Status: DC | PRN

## 2018-08-14 MED ORDER — SODIUM CHLORIDE 0.9 % IV SOLN
INTRAVENOUS | Status: DC
  Administered 2018-08-14 – 2018-08-15 (×2): via INTRAVENOUS
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
## 2018-08-14 NOTE — Unmapped (Signed)
Hi,     Sherri contacted the Communication Center requesting to speak with the care team of Ferdie Ping to discuss:    Would like to discuss her mother care treatment.    Please contact Sherri at [phone].    Program: GI  Speciality: Medical Oncology    Check Indicates criteria has been reviewed and confirmed with the patient:    []  Preferred Name   [x]  DOB and/or MR#  [x]  Preferred Contact Method  [x]  Phone Number(s)   []  MyChart     Thank you,   Christell Faith  [facility]   [phone]

## 2018-08-14 NOTE — Unmapped (Signed)
RN spoke with pt's daughter [name] who says that pt will not be able to make appointment scheduled for tomorrow because pt had a stroke and is in the hospital at [facility]. She is inquiring about Sando injection pt was due to receive. Also wanted to let team know that she missed a dose of her [name] due to her not being able to pick it up from the pt's home but she will pick it up tonight for pt. Will relay to team.

## 2018-08-14 NOTE — Progress Notes (Signed)
Pharmacy Electrolyte Monitoring Consult:  Pharmacy consulted to assist in monitoring and replacing electrolytes in this 83 y.o. female admitted on 08/13/2018 with No chief complaint on file.   Labs:  Sodium (mmol/L)  Date Value  08/13/2018 142  06/12/2014 142   Potassium (mmol/L)  Date Value  08/14/2018 3.5  06/12/2014 4.1   Magnesium (mg/dL)  Date Value  08/14/2018 2.0   Phosphorus (mg/dL)  Date Value  12/28/2014 4.1   Calcium (mg/dL)  Date Value  08/13/2018 8.8 (L)   Calcium, Total (mg/dL)  Date Value  06/12/2014 9.1   Albumin (g/dL)  Date Value  08/13/2018 3.3 (L)  06/12/2014 3.6    Assessment: 83 yo female here with stroke. Per RN pt failed swallow eval x2. K 2.9 yesterday on admission, no supplementation.   Plan: Repeat potassium 3.5 after 10 mEq IV x 4. Will hold off on additional supplementation and follow up potassium with morning BMP.  Tawnya Crook, PharmD Pharmacy Resident  08/14/2018 6:29 PM

## 2018-08-14 NOTE — Progress Notes (Signed)
Lovenox changed to 30 mg daily for BMI <40 and CrCl <30 

## 2018-08-14 NOTE — ED Notes (Signed)
Tele neuro still has not called at this time. Previous Network engineer called at [phone]. This RN informed currently secretary to call back number to check on status. This RN will continue to monitor.

## 2018-08-14 NOTE — Progress Notes (Signed)
   08/14/18 1200  Clinical Encounter Type  Visited With Family  Visit Type Initial;Spiritual support  Referral From Physician  Spiritual Encounters  Spiritual Needs Emotional;Grief support;Other (Comment)  Stress Factors  Family Stress Factors Exhausted
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
## 2018-08-14 NOTE — Progress Notes (Signed)
Pharmacy Electrolyte Monitoring Consult:  Pharmacy consulted to assist in monitoring and replacing electrolytes in this 83 y.o. female admitted on 08/13/2018 with No chief complaint on file.   Labs:  Sodium (mmol/L)  Date Value  08/13/2018 142  06/12/2014 142   Potassium (mmol/L)  Date Value  08/14/2018 2.9 (L)  06/12/2014 4.1   Magnesium (mg/dL)  Date Value  08/14/2018 2.0   Phosphorus (mg/dL)  Date Value  12/28/2014 4.1   Calcium (mg/dL)  Date Value  08/13/2018 8.8 (L)   Calcium, Total (mg/dL)  Date Value  06/12/2014 9.1   Albumin (g/dL)  Date Value  08/13/2018 3.3 (L)  06/12/2014 3.6    Assessment: 83 yo female here with stroke. Per RN pt failed swallow eval x2. K 2.9 yesterday on admission, no supplementation.   Plan: Add on Mag and K this AM (SCr in chart) K 2.9, Mag 2.0 - will order KCl 10 mEq IV x4 runs Recheck at 1800 BMET in AM   Rayna Sexton L 08/14/2018 9:27 AM

## 2018-08-14 NOTE — Consult Note (Signed)
Referring Physician: Dr. Jannifer Franklin     Reason for Consult: Aphasia   HPI: Veronica Wang is an 83 y.o. female with past medical history significant for hypertension, CHF, hyperlipidemia, paroxysmal atrial fibrillation not on anticoagulation, liver CA who presented to the hospital complaining about difficulty speaking. Patient came the emergency room accompanied by her family who noticed that the patient has been having difficulty expressing herself throughout the day, patient last seen normal at 1 PM but ever since she has been having some difficulty finishing her sentences and expressing herself according to her daughter-in-law, patient also seems to be confused, however patient did not have any focal motor weakness, patient denied any loss of consciousness or fall, patient denied any visual changes or numbness, patient reported no chest pain or shortness of breath, patient did not have any nausea or vomiting. In the emergency room patient had a CT scan of the brain which shows no acute intracranial abnormalities, she had a CTA and CT perfusion which showed no large vessel occlusion however showed some evidence for left MCA territory infarction, patient was admitted to the hospital for stroke work-up. Pt has a history of paroxysmal atrial fibrillation, she is not on [facility] and was not clear on the reason.  Date last known well: 08/13/2018 Time last known well: 1 PM  tPA Given: No, outside TPA window  Past Medical History Past Medical History:  Diagnosis Date  . Abnormal MRI 09/10/2015  . Atrial fibrillation ([place])   . Cancer ([place])    Liver  . 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 Carcinoid tumor of intestine   . CHF (congestive heart failure) ([place])   . COPD (chronic obstructive pulmonary disease) ([place])   . FH: chemotherapy   . GERD (gastroesophageal reflux disease)   . Hyperlipidemia   . Hypertension   . Macular degeneration   . Polycythemia   . Radiation     Surgical History Past Surgical History:   Procedure Laterality Date  . CATARACT EXTRACTION  splenectomy  . COLON RESECTION    . COLON SURGERY    . GASTRECTOMY    . SPLENECTOMY      Family History  Family History  Problem Relation Age of Onset  . Heart attack Sister   . Heart attack Brother   . Leukemia Mother     Social History:   reports that she has quit smoking. Her smoking use included cigarettes. She has never used smokeless tobacco. She reports that she does not drink alcohol or use drugs.  Allergies:  Allergies  Allergen Reactions  . No Known Allergies     Home Medications:  Medications Prior to Admission  Medication Sig Dispense Refill  . acetaminophen (TYLENOL) 325 MG tablet Take 2 tablets (650 mg total) by mouth every 6 (six) hours as needed for mild pain (or Fever >/= 101). 30 tablet 1  . albuterol (PROVENTIL HFA;VENTOLIN HFA) 108 (90 BASE) MCG/ACT inhaler Inhale 2 puffs into the lungs every 6 (six) hours as needed for wheezing or shortness of breath.    Marland Kitchen aspirin EC 81 MG tablet Take 81 mg by mouth daily.     . Cholecalciferol (VITAMIN D-1000 MAX ST) 1000 UNITS tablet Take 1,000 Units by mouth daily.     . Cyanocobalamin (VITAMIN B-12 CR) 1000 MCG TBCR Take 1 tablet by mouth daily.     Marland Kitchen lisinopril (PRINIVIL,ZESTRIL) 5 MG tablet Take 5 mg by mouth daily.    Marland Kitchen loperamide (IMODIUM A-D) 2 MG tablet  Take 4 mg by mouth 4 (four) times daily as needed for diarrhea or loose stools.    . Multiple Vitamins-Minerals (CENTRUM SILVER) tablet Take 1 tablet by mouth daily.     . ondansetron (ZOFRAN) 8 MG tablet Take 1 tablet (8 mg total) by mouth every 8 (eight) hours as needed for nausea or vomiting. 30 tablet 0  . prochlorperazine (COMPAZINE) 10 MG tablet Take 1 tablet (10 mg total) by mouth every 6 (six) hours as needed for nausea or vomiting. 30 tablet 0  . Telotristat Etiprate 250 MG TABS Take 1 tablet by mouth 3 (three) times daily.    . famotidine (PEPCID) 20 MG tablet Take 20 mg by mouth daily.     . feeding  supplement (BOOST HIGH PROTEIN) LIQD Take 1 Container by mouth 3 (three) times daily between meals.    . furosemide (LASIX) 20 MG tablet Take 20 mg by mouth daily.     Marland Kitchen lidocaine (XYLOCAINE) 2 % solution Use as directed 15 mLs in the mouth or throat every 6 (six) hours as needed (chest/stomach pain). (Patient not taking: Reported on 08/13/2018) 100 mL 0  . potassium chloride SA (K-DUR,KLOR-CON) 20 MEQ tablet Take 2 tablets (40 mEq total) by mouth 2 (two) times daily. (Patient not taking: Reported on 01/03/2018) 30 tablet 0  . sucralfate (CARAFATE) 1 g tablet Take 1 tablet (1 g total) by mouth 4 (four) times daily. (Patient not taking: Reported on 08/13/2018) 60 tablet 0    Hospital Medications . aspirin EC  81 mg Oral Daily  . enoxaparin (LOVENOX) injection  30 mg Subcutaneous Q24H  . famotidine  20 mg Oral Daily  . feeding supplement (ENSURE ENLIVE)  237 mL Oral BID BM  . multivitamin with minerals  1 tablet Oral Daily    ROS:   Negative except of what is reported in HPI   Physical Examination:  Vitals:   08/14/18 0317 08/14/18 0638 08/14/18 0953 08/14/18 1027  BP: (!) 148/85 136/69 (!) 146/93   Pulse: 75 72 84   Resp: 17 18 20    Temp: 97.7 F (36.5 C) 98.3 F (36.8 C) 97.6 F (36.4 C)   TempSrc: Oral Oral Oral   SpO2: 99% 100% 95% 95%  Weight:      Height:        General - NAD Heart - Regular rate and rhythm - no murmer appreciated Lungs - Clear to auscultation  Abdomen - Soft - non tender Extremities - Distal pulses intact - no edema Skin - Warm and dry   Neurologic Examination:   Alert, oriented to self only, mildly slurred speech, mild expressive aphasia, follows commands with intact naming   Visual fields grossly normal, pupils equal, round, reactive to light extra-ocular motions intact bilaterally smile symmetric, facial light touch sensation normal bilaterally midline tongue extension Motor: RUE - 5/5    LUE - 5/5   RLE - 5/5    LLE - 5/5 Sensory: Light touch  intact throughout, bilaterally   NIHSS 4 1a Level of Conscious:0 1b LOC Questions: 2 1c LOC Commands: 0 2 Best Gaze: 0 3 Visual: 0 4 Facial Palsy: 0 5a Motor Arm - left: 0 5b Motor Arm - Right: 0 6a Motor Leg - Left: 0 6b Motor Leg - Right: 0 7 Limb Ataxia: 0 8 Sensory: 0 9 Best Language: 1 10 Dysarthria:1 11 Extinct. and Inattention:0 TOTAL: 4   LABORATORY STUDIES:  Basic Metabolic Panel: Recent Labs  Lab 08/13/18 1813 08/14/18 0415  NA 142  --   K 2.9* 2.9*  CL 105  --   CO2 30  --   GLUCOSE 94  --   BUN 14  --   CREATININE 0.67 0.57  CALCIUM 8.8*  --   MG  --  2.0    Liver Function Tests: Recent Labs  Lab 08/13/18 1813  AST 27  ALT 16  ALKPHOS 107  BILITOT 0.5  PROT 6.3*  ALBUMIN 3.3*   No results for input(s): LIPASE, AMYLASE in the last 168 hours. No results for input(s): AMMONIA in the last 168 hours.  CBC: Recent Labs  Lab 08/13/18 1813 08/14/18 0415  WBC 10.0 10.0  NEUTROABS 6.6  --   HGB 13.8 12.8  HCT 43.2 39.3  MCV 82.3 82.2  PLT 300 262    Cardiac Enzymes: Recent Labs  Lab 08/13/18 1813  TROPONINI 0.03*    BNP: Invalid input(s): POCBNP  CBG: Recent Labs  Lab 08/13/18 1854  GLUCAP 83    Microbiology:   Coagulation Studies: Recent Labs    08/13/18 1813  LABPROT 13.3  INR 1.02    Urinalysis: No results for input(s): COLORURINE, LABSPEC, PHURINE, GLUCOSEU, HGBUR, BILIRUBINUR, KETONESUR, PROTEINUR, UROBILINOGEN, NITRITE, LEUKOCYTESUR in the last 168 hours.  Invalid input(s): APPERANCEUR  Lipid Panel:     Component Value Date/Time   CHOL 158 08/14/2018 0415   CHOL 136 11/08/2013 0309   TRIG 89 08/14/2018 0415   TRIG 84 11/08/2013 0309   HDL 69 08/14/2018 0415   HDL 51 11/08/2013 0309   CHOLHDL 2.3 08/14/2018 0415   VLDL 18 08/14/2018 0415   VLDL 17 11/08/2013 0309   LDLCALC 71 08/14/2018 0415   LDLCALC 68 11/08/2013 0309    HgbA1C:  Lab Results  Component Value Date   HGBA1C 6.0 (H) 08/14/2018     Urine Drug Screen:  No results found for: LABOPIA, COCAINSCRNUR, LABBENZ, AMPHETMU, THCU, LABBARB   Alcohol Level:  No results for input(s): ETH in the last 168 hours.  Miscellaneous labs:  EKG  EKG   IMAGING: Ct Angio Head W Or Wo Contrast  Result Date: 08/13/2018 EXAM: CT ANGIOGRAPHY HEAD AND NECK CT PERFUSION BRAIN TECHNIQUE: Multidetector CT imaging of the head and neck was performed using the standard protocol during bolus administration of intravenous contrast. Multiplanar CT image reconstructions and MIPs were obtained to evaluate the vascular anatomy. Carotid stenosis measurements (when applicable) are obtained utilizing NASCET criteria, using the distal internal carotid diameter as the denominator. Multiphase CT imaging of the brain was performed following IV bolus contrast injection. Subsequent parametric perfusion maps were calculated using RAPID software. CONTRAST:  169mL OMNIPAQUE IOHEXOL 350 MG/ML SOLN COMPARISON:  Prior noncontrast head CT from earlier the same day. FINDINGS: CTA NECK FINDINGS Aortic arch: Visualized aortic arch of normal caliber with normal 3 vessel morphology. Moderate atherosclerotic change about the arch itself. No hemodynamically significant stenosis about the origin of the great vessels. Visualized subclavian arteries widely patent. Right carotid system: Right common carotid artery tortuous proximally but widely patent to the bifurcation. Minimal atheromatous irregularity about the right bifurcation without significant stenosis. Right ICA tortuous but widely patent distally to the skull base without stenosis, dissection, or occlusion. Left carotid system: Left common carotid artery tortuous proximally but widely patent to the bifurcation without stenosis. Minimal atheromatous plaque about the left bifurcation without significant stenosis. Left ICA mildly tortuous but widely patent distally to the skull base without stenosis, dissection, or occlusion.  Vertebral arteries: Both of the  vertebral arteries arise from the subclavian arteries. Vertebral arteries mildly tortuous proximally but widely patent without stenosis, dissection, or occlusion. Skeleton: No acute osseous abnormality. No discrete lytic or blastic osseous lesions. Patient is edentulous. Other neck: No other acute abnormality within the neck. Upper chest: Visualized lungs are hyperexpanded with severe emphysematous changes. 6 mm spiculated nodule present at the left lung apex (series 5, image 34). Review of the MIP images confirms the above findings CTA HEAD FINDINGS Anterior circulation: Petrous segments widely patent bilaterally. Mild scattered atheromatous plaque within the cavernous/supraclinoid ICAs without significant stenosis. ICA termini widely patent. A1 segments well perfused bilaterally. Normal anterior communicating artery. Anterior cerebral arteries widely patent to their distal aspects. M1 segments widely patent bilaterally. No proximal M2 occlusion. Distal MCA branches well perfused and fairly symmetric. Posterior circulation: Vertebral arteries patent to the vertebrobasilar junction without stenosis. Left vertebral artery slightly dominant. Posterior inferior cerebral arteries patent bilaterally. Basilar widely patent to its distal aspect. Superior cerebral arteries patent bilaterally. Left PCA supplied via the basilar. Fetal type origin of the right PCA supplied via a widely patent right posterior communicating artery. PCAs widely patent to their distal aspects without stenosis. Venous sinuses: Patent. Anatomic variants: Fetal type right PCA.  No intracranial aneurysm. Delayed phase: No abnormal enhancement. Evolving left posterior MCA cortical infarct noted (series 13, image 15). Review of the MIP images confirms the above findings CT Brain Perfusion Findings: CBF (<30%) Volume: 29mL Perfusion (Tmax>6.0s) volume: 45mL Mismatch Volume: -49mL Infarction Location:Small acute cortical  infarct involving the posterior left MCA distribution at the posterior left frontal lobe. No surrounding perfusion deficit or penumbra. IMPRESSION: 1. Negative CTA for large vessel occlusion, with wide patency of the major arterial vasculature of the head and neck. 2. 3 mL core infarct at the posterior left frontotemporal region, posterior left MCA territory. No surrounding perfusion deficit or penumbra. 3. Mild atherosclerotic change for patient age without hemodynamically significant stenosis within the head and neck. 4. Severe emphysema. 5. **An incidental finding of potential clinical significance has been found. 6 mm spiculated nodule at the left lung apex, indeterminate. Non-contrast chest CT at 6-12 months is recommended. If the nodule is stable at time of repeat CT, then future CT at 18-24 months (from today's scan) is considered optional for low-risk patients, but is recommended for high-risk patients. This recommendation follows the consensus statement: Guidelines for Management of Incidental Pulmonary Nodules Detected on CT Images: From the Fleischner Society 2017; Radiology 2017; 284:228-243.** Critical Value/emergent results were called by telephone at the time of interpretation on 08/13/2018 at 9:50 pm to Dr. Clearnce Hasten, who verbally acknowledged these results. Electronically Signed   By: Jeannine Boga M.D.   On: 08/13/2018 22:11   Ct Head Wo Contrast  Result Date: 08/13/2018 CLINICAL DATA:  83 year old female with acute altered mental status and aphasia today. EXAM: CT HEAD WITHOUT CONTRAST TECHNIQUE: Contiguous axial images were obtained from the base of the skull through the vertex without intravenous contrast. COMPARISON:  06/02/2015 head CT FINDINGS: Brain: No evidence of acute infarction, hemorrhage, hydrocephalus, extra-axial collection or mass lesion/mass effect. Atrophy and chronic small-vessel white matter ischemic changes again noted. Vascular: Carotid atherosclerotic calcifications  again identified. Skull: Normal. Negative for fracture or focal lesion. Sinuses/Orbits: No acute finding. Other: None. IMPRESSION: 1. No evidence of acute intracranial abnormality. 2. Atrophy and chronic small-vessel white matter ischemic changes. Electronically Signed   By: Margarette Canada M.D.   On: 08/13/2018 18:55   Ct Angio Neck W Or Wo  Contrast  Result Date: 08/13/2018 EXAM: CT ANGIOGRAPHY HEAD AND NECK CT PERFUSION BRAIN TECHNIQUE: Multidetector CT imaging of the head and neck was performed using the standard protocol during bolus administration of intravenous contrast. Multiplanar CT image reconstructions and MIPs were obtained to evaluate the vascular anatomy. Carotid stenosis measurements (when applicable) are obtained utilizing NASCET criteria, using the distal internal carotid diameter as the denominator. Multiphase CT imaging of the brain was performed following IV bolus contrast injection. Subsequent parametric perfusion maps were calculated using RAPID software. CONTRAST:  169mL OMNIPAQUE IOHEXOL 350 MG/ML SOLN COMPARISON:  Prior noncontrast head CT from earlier the same day. FINDINGS: CTA NECK FINDINGS Aortic arch: Visualized aortic arch of normal caliber with normal 3 vessel morphology. Moderate atherosclerotic change about the arch itself. No hemodynamically significant stenosis about the origin of the great vessels. Visualized subclavian arteries widely patent. Right carotid system: Right common carotid artery tortuous proximally but widely patent to the bifurcation. Minimal atheromatous irregularity about the right bifurcation without significant stenosis. Right ICA tortuous but widely patent distally to the skull base without stenosis, dissection, or occlusion. Left carotid system: Left common carotid artery tortuous proximally but widely patent to the bifurcation without stenosis. Minimal atheromatous plaque about the left bifurcation without significant stenosis. Left ICA mildly tortuous but  widely patent distally to the skull base without stenosis, dissection, or occlusion. Vertebral arteries: Both of the vertebral arteries arise from the subclavian arteries. Vertebral arteries mildly tortuous proximally but widely patent without stenosis, dissection, or occlusion. Skeleton: No acute osseous abnormality. No discrete lytic or blastic osseous lesions. Patient is edentulous. Other neck: No other acute abnormality within the neck. Upper chest: Visualized lungs are hyperexpanded with severe emphysematous changes. 6 mm spiculated nodule present at the left lung apex (series 5, image 34). Review of the MIP images confirms the above findings CTA HEAD FINDINGS Anterior circulation: Petrous segments widely patent bilaterally. Mild scattered atheromatous plaque within the cavernous/supraclinoid ICAs without significant stenosis. ICA termini widely patent. A1 segments well perfused bilaterally. Normal anterior communicating artery. Anterior cerebral arteries widely patent to their distal aspects. M1 segments widely patent bilaterally. No proximal M2 occlusion. Distal MCA branches well perfused and fairly symmetric. Posterior circulation: Vertebral arteries patent to the vertebrobasilar junction without stenosis. Left vertebral artery slightly dominant. Posterior inferior cerebral arteries patent bilaterally. Basilar widely patent to its distal aspect. Superior cerebral arteries patent bilaterally. Left PCA supplied via the basilar. Fetal type origin of the right PCA supplied via a widely patent right posterior communicating artery. PCAs widely patent to their distal aspects without stenosis. Venous sinuses: Patent. Anatomic variants: Fetal type right PCA.  No intracranial aneurysm. Delayed phase: No abnormal enhancement. Evolving left posterior MCA cortical infarct noted (series 13, image 15). Review of the MIP images confirms the above findings CT Brain Perfusion Findings: CBF (<30%) Volume: 37mL Perfusion  (Tmax>6.0s) volume: 70mL Mismatch Volume: -24mL Infarction Location:Small acute cortical infarct involving the posterior left MCA distribution at the posterior left frontal lobe. No surrounding perfusion deficit or penumbra. IMPRESSION: 1. Negative CTA for large vessel occlusion, with wide patency of the major arterial vasculature of the head and neck. 2. 3 mL core infarct at the posterior left frontotemporal region, posterior left MCA territory. No surrounding perfusion deficit or penumbra. 3. Mild atherosclerotic change for patient age without hemodynamically significant stenosis within the head and neck. 4. Severe emphysema. 5. **An incidental finding of potential clinical significance has been found. 6 mm spiculated nodule at the left lung apex, indeterminate.  Non-contrast chest CT at 6-12 months is recommended. If the nodule is stable at time of repeat CT, then future CT at 18-24 months (from today's scan) is considered optional for low-risk patients, but is recommended for high-risk patients. This recommendation follows the consensus statement: Guidelines for Management of Incidental Pulmonary Nodules Detected on CT Images: From the Fleischner Society 2017; Radiology 2017; 284:228-243.** Critical Value/emergent results were called by telephone at the time of interpretation on 08/13/2018 at 9:50 pm to Dr. Clearnce Hasten, who verbally acknowledged these results. Electronically Signed   By: Jeannine Boga M.D.   On: 08/13/2018 22:11   Ct Cerebral Perfusion W Contrast  Result Date: 08/13/2018 EXAM: CT ANGIOGRAPHY HEAD AND NECK CT PERFUSION BRAIN TECHNIQUE: Multidetector CT imaging of the head and neck was performed using the standard protocol during bolus administration of intravenous contrast. Multiplanar CT image reconstructions and MIPs were obtained to evaluate the vascular anatomy. Carotid stenosis measurements (when applicable) are obtained utilizing NASCET criteria, using the distal internal carotid  diameter as the denominator. Multiphase CT imaging of the brain was performed following IV bolus contrast injection. Subsequent parametric perfusion maps were calculated using RAPID software. CONTRAST:  116mL OMNIPAQUE IOHEXOL 350 MG/ML SOLN COMPARISON:  Prior noncontrast head CT from earlier the same day. FINDINGS: CTA NECK FINDINGS Aortic arch: Visualized aortic arch of normal caliber with normal 3 vessel morphology. Moderate atherosclerotic change about the arch itself. No hemodynamically significant stenosis about the origin of the great vessels. Visualized subclavian arteries widely patent. Right carotid system: Right common carotid artery tortuous proximally but widely patent to the bifurcation. Minimal atheromatous irregularity about the right bifurcation without significant stenosis. Right ICA tortuous but widely patent distally to the skull base without stenosis, dissection, or occlusion. Left carotid system: Left common carotid artery tortuous proximally but widely patent to the bifurcation without stenosis. Minimal atheromatous plaque about the left bifurcation without significant stenosis. Left ICA mildly tortuous but widely patent distally to the skull base without stenosis, dissection, or occlusion. Vertebral arteries: Both of the vertebral arteries arise from the subclavian arteries. Vertebral arteries mildly tortuous proximally but widely patent without stenosis, dissection, or occlusion. Skeleton: No acute osseous abnormality. No discrete lytic or blastic osseous lesions. Patient is edentulous. Other neck: No other acute abnormality within the neck. Upper chest: Visualized lungs are hyperexpanded with severe emphysematous changes. 6 mm spiculated nodule present at the left lung apex (series 5, image 34). Review of the MIP images confirms the above findings CTA HEAD FINDINGS Anterior circulation: Petrous segments widely patent bilaterally. Mild scattered atheromatous plaque within the  cavernous/supraclinoid ICAs without significant stenosis. ICA termini widely patent. A1 segments well perfused bilaterally. Normal anterior communicating artery. Anterior cerebral arteries widely patent to their distal aspects. M1 segments widely patent bilaterally. No proximal M2 occlusion. Distal MCA branches well perfused and fairly symmetric. Posterior circulation: Vertebral arteries patent to the vertebrobasilar junction without stenosis. Left vertebral artery slightly dominant. Posterior inferior cerebral arteries patent bilaterally. Basilar widely patent to its distal aspect. Superior cerebral arteries patent bilaterally. Left PCA supplied via the basilar. Fetal type origin of the right PCA supplied via a widely patent right posterior communicating artery. PCAs widely patent to their distal aspects without stenosis. Venous sinuses: Patent. Anatomic variants: Fetal type right PCA.  No intracranial aneurysm. Delayed phase: No abnormal enhancement. Evolving left posterior MCA cortical infarct noted (series 13, image 15). Review of the MIP images confirms the above findings CT Brain Perfusion Findings: CBF (<30%) Volume: 50mL Perfusion (Tmax>6.0s) volume:  3mL Mismatch Volume: -67mL Infarction Location:Small acute cortical infarct involving the posterior left MCA distribution at the posterior left frontal lobe. No surrounding perfusion deficit or penumbra. IMPRESSION: 1. Negative CTA for large vessel occlusion, with wide patency of the major arterial vasculature of the head and neck. 2. 3 mL core infarct at the posterior left frontotemporal region, posterior left MCA territory. No surrounding perfusion deficit or penumbra. 3. Mild atherosclerotic change for patient age without hemodynamically significant stenosis within the head and neck. 4. Severe emphysema. 5. **An incidental finding of potential clinical significance has been found. 6 mm spiculated nodule at the left lung apex, indeterminate. Non-contrast chest  CT at 6-12 months is recommended. If the nodule is stable at time of repeat CT, then future CT at 18-24 months (from today's scan) is considered optional for low-risk patients, but is recommended for high-risk patients. This recommendation follows the consensus statement: Guidelines for Management of Incidental Pulmonary Nodules Detected on CT Images: From the Fleischner Society 2017; Radiology 2017; 284:228-243.** Critical Value/emergent results were called by telephone at the time of interpretation on 08/13/2018 at 9:50 pm to Dr. Clearnce Hasten, who verbally acknowledged these results. Electronically Signed   By: Jeannine Boga M.D.   On: 08/13/2018 22:11      Assessment: 83 y.o. female with past medical history significant for paroxysmal atrial fibrillation on anticoagulation, hypertension, coronary artery disease presented to the hospital with acute onset of expressive aphasia the CT perfusion showed core infarct around the left MCA territory but no penumbra. Acute left MCA stroke with expressive aphasia.       Plan:   Stroke work up including MRI brain, A1C, ECHO, Lipide profile, PT/OT and speech evaluation  Pt has paroxsymal Afib, needs to be started on anti-coagulation if no contraindication, will have further discussion with the patient and family about oral anticioagulation after reviewing MRI to evaluate the size of the stroke  -Continue ASA, add Lipitor for LDL goal < 70  Risk factor modification  Telemetry monitoring  Frequent neuro checks  Fall Precautions  DVT prophelaxis  NPO until swallowing evaluation has been passed (aspirin suppository if needed)  Plan discussed in details with the patient and her family at bedside.     Arnaldo Natal, MD

## 2018-08-14 NOTE — Evaluation (Addendum)
Clinical/Bedside Swallow Evaluation Patient Details  Name: Veronica Wang MRN: [id number] Date of Birth: 1936/02/28  Today's Date: 08/14/2018 Time: SLP Start Time (ACUTE ONLY): 1135 SLP Stop Time (ACUTE ONLY): 1225 SLP Time Calculation (min) (ACUTE ONLY): 50 min  Past Medical History:  Past Medical History:  Diagnosis Date  . Abnormal MRI 09/10/2015  . Atrial fibrillation ([place])   . Cancer ([place])    Liver  . Cancer ([place])    stomach  . Carcinoid tumor of intestine   . CHF (congestive heart failure) ([place])   . COPD (chronic obstructive pulmonary disease) ([place])   . FH: chemotherapy   . GERD (gastroesophageal reflux disease)   . Hyperlipidemia   . Hypertension   . Macular degeneration   . Polycythemia   . Radiation    Past Surgical History:  Past Surgical History:  Procedure Laterality Date  . CATARACT EXTRACTION  splenectomy  . COLON RESECTION    . COLON SURGERY    . GASTRECTOMY    . SPLENECTOMY     HPI:  Per admitting H&P: ptis an 83 y.o. female with past medical history significant for hypertension, CHF, hyperlipidemia, paroxysmal atrial fibrillation not on anticoagulation, liver CA who presented to the hospital complaining about difficulty speaking. Patient came the emergency room accompanied by her family who noticed that the patient has been having difficulty expressing herself throughout the day, patient last seen normal at 1 PM but ever since she has been having some difficulty finishing her sentences and expressing herself according to her daughter-in-law, patient also seems to be confused, however patient did not have any focal motor weakness, patient denied any loss of consciousness or fall, patient denied any visual changes or numbness, patient reported no chest pain or shortness of breath, patient did not have any nausea or vomiting. In the emergency room patient had a CT scan of the brain which shows no acute intracranial abnormalities, she had a CTA and CT perfusion which  showed no large vessel occlusion however showed some evidence for left MCA territory infarction, patient was admitted to the hospital for stroke work-up. Pt has a history of paroxysmal atrial fibrillation, she is not on [facility] and was not clear on the reason.   Assessment / Plan / Recommendation Clinical Impression  Pt appears to present w/ adequate oropharyngeal swallowing function w/ reduced risk for aspiration when following general aspiration precautions. Pt was sitting upright in bed w/ family and friends present upon ST entry. Pt appeared drowsy, but was easily aroused, alert, pleasant, and cooperative throughout BSE.  Prior to PO trials, ST muted television and minimized environmental distractions by sitting face-to-face w/ pt for undivided attention, d/t family and friends in room. ST administered brief OM exam, which revealed WFL lip, tongue, jaw strength. ST did not note any differences in facial symmetry. Pt uses top dentures all the time, but does not wear bottom set. Noted pt had min difficulty localizing her attention from R to L, however w/ min cues pt demonstrated appropriate awareness.  ST began PO trials w/ 2 ice chips to moisten the mouth and stimulate the swallowing reflex. Pt then held cup and took ~5-6 sips of water via cup rim w/o any immediate overt s/s of aspiration noted (throat clearing, coughing, choking, outward changes in respiratory patterns).  Similar was noted w/ purees; pt self fed ~1/2 container of applesauce exhibiting timely A-P transfer. Pt exhibited adequate lingual sweeping and oral clearing; no immediate overt s/s of aspiration noted. Pt also consumed ~  2-3 PO trials of solids (graham cracker w/ applesauce) and demonstrated adequate rotary chewing/mashing (d/t lacking bottom dentition) w/ timely A-P transfer and no immediate overt s/s of aspiration noted. Adequate oral clearing and lingual sweeping w/ solids as well.  Family shared that pt had been asking for coffee; ST  brought a cup and pt consumed ~5oz w/o any immediate overt s/s of aspiration.  During BSE, ST informally screened and noted pt's simple conversational abilities to be [facility]. Skilled ST services to f/u tomorrow w/ formal assessment to gain objective information regarding cognitive-linguistic status post-stroke. Will also ask pt/family if any hearing deficits.   Recommend: Dysphagia III (MECHANICAL soft) w/ extra gravies, sauces, butters for easier eating d/t dentures. Thin liquids via Cup. Meds in Puree if needed for easier swallowing.  Pt appeared to benefit from minimized environmental distractions; turn off/mute television and have 1 person at a time to assist w/ meals. General aspiration precautions for reduced risk of aspiration (sitting upright, reduced distractions, small bites and sips, complete oral clearing post-meals). SLP Visit Diagnosis: Aphasia (R47.01);Dysarthria and anarthria (R47.1)    Aspiration Risk  Mild aspiration risk    Diet Recommendation  Dysphagia III (MECHANICAL soft) w/ extra gravies, sauces, butters for easier eating (d/t upper dentures). Thin liquids via Cup.   Medication Administration: Whole meds with puree    Other  Recommendations Recommended Consults: Other (Comment)(Dietician consult) Oral Care Recommendations: Oral care BID   Follow up Recommendations Other (comment)(TBD)      Frequency and Duration min 2x/week  2 weeks       Prognosis Prognosis for Safe Diet Advancement: Good Barriers to Reach Goals: Cognitive deficits;Language deficits;Time post onset      Swallow Study   General Date of Onset: 08/13/18 HPI: Per admitting H&P: ptis an 83 y.o. female with past medical history significant for hypertension, CHF, hyperlipidemia, paroxysmal atrial fibrillation not on anticoagulation, liver CA who presented to the hospital complaining about difficulty speaking. Patient came the emergency room accompanied by her family who noticed that the patient has been  having difficulty expressing herself throughout the day, patient last seen normal at 1 PM but ever since she has been having some difficulty finishing her sentences and expressing herself according to her daughter-in-law, patient also seems to be confused, however patient did not have any focal motor weakness, patient denied any loss of consciousness or fall, patient denied any visual changes or numbness, patient reported no chest pain or shortness of breath, patient did not have any nausea or vomiting. In the emergency room patient had a CT scan of the brain which shows no acute intracranial abnormalities, she had a CTA and CT perfusion which showed no large vessel occlusion however showed some evidence for left MCA territory infarction, patient was admitted to the hospital for stroke work-up. Pt has a history of paroxysmal atrial fibrillation, she is not on [facility] and was not clear on the reason. Type of Study: Bedside Swallow Evaluation Diet Prior to this Study: NPO Temperature Spikes Noted: No(WBC 10.0) Respiratory Status: Nasal cannula History of Recent Intubation: No Behavior/Cognition: Alert;Cooperative;Pleasant mood;Lethargic/Drowsy;Distractible;Requires cueing Oral Cavity Assessment: Within Functional Limits Oral Care Completed by SLP: No Oral Cavity - Dentition: Dentures, top;Missing dentition(does not use bottom dentition ) Vision: Functional for self-feeding Self-Feeding Abilities: Able to feed self;Needs set up Patient Positioning: Upright in bed Baseline Vocal Quality: Normal;Low vocal intensity Volitional Cough: Other (Comment)(NT)    Oral/Motor/Sensory Function Overall Oral Motor/Sensory Function: Within functional limits   [employer]  chips: Within functional limits Presentation: Spoon   Thin Liquid Thin Liquid: Within functional limits Presentation: Cup;Self Fed    Nectar Thick Nectar Thick Liquid: Not tested   Honey Thick Honey Thick Liquid: Not tested   Puree Puree: Within  functional limits Presentation: Self Fed;Spoon   Solid     Solid: Within functional limits Presentation: Self Fed;Spoon      Emeline General, Graduate Student SLP 08/14/2018,2:27 PM

## 2018-08-14 NOTE — Progress Notes (Addendum)
[facility] NAME: Veronica Wang    MR#:  [id number]  DATE OF BIRTH:  [birth year]  SUBJECTIVE:   Family feels that patient speech has improved.  Patient states that she feels okay this morning.  She denies any numbness, weakness, tingling upper extremities.  REVIEW OF SYSTEMS:  Review of Systems  Constitutional: Negative for chills and fever.  HENT: Negative for congestion and sore throat.   Eyes: Negative for blurred vision and double vision.  Respiratory: Negative for cough and shortness of breath.   Cardiovascular: Negative for chest pain and leg swelling.  Gastrointestinal: Negative for nausea and vomiting.  Genitourinary: Negative for dysuria and urgency.  Musculoskeletal: Negative for back pain and neck pain.  Neurological: Positive for speech change. Negative for dizziness, sensory change, focal weakness and headaches.  Psychiatric/Behavioral: Negative for depression. The patient is not nervous/anxious.     DRUG ALLERGIES:   Allergies  Allergen Reactions  . No Known Allergies    VITALS:  Blood pressure 135/74, pulse 75, temperature 97.7 F (36.5 C), resp. rate 20, height 5\' 2"  (1.575 m), weight 81 kg, SpO2 98 % PHYSICAL EXAMINATION:  Physical Exam  General: Sitting up in bed, in no acute distress HEENT: Normocephalic, atraumatic, EOMI, no scleral icterus, moist mucous membranes Neck: Normal range of motion. Neck supple. No JVD present. No thyromegaly present.  Cardiovascular: RRR.  No murmurs, rubs, or gallops. Respiratory: Effort normal and breath sounds normal. No respiratory distress. She has no wheezes. She has no rales. [place] in place. GI: soft, non-tender, non-distended Musculoskeletal:  No pedal edema, cyanosis, clubbing Neurological: 5/5 muscle strength in all extremities.  Sensation intact light touch throughout. + Occasional word finding difficulty. Skin: Skin is warm and dry. No rash noted. No erythema.    Psychiatric: She has a normal mood and affect. Her behavior is normal. Judgment and thought content normal.  LABORATORY PANEL:  Female CBC Recent Labs  Lab 08/14/18 0415  WBC 10.0  HGB 12.8  HCT 39.3  PLT 262   ------------------------------------------------------------------------------------------------------------------ Chemistries  Recent Labs  Lab 08/13/18 1813 08/14/18 0415  NA 142  --   K 2.9* 2.9*  CL 105  --   CO2 30  --   GLUCOSE 94  --   BUN 14  --   CREATININE 0.67 0.57  CALCIUM 8.8*  --   MG  --  2.0  AST 27  --   ALT 16  --   ALKPHOS 107  --   BILITOT 0.5  --    RADIOLOGY:  Ct Angio Head W Or Wo Contrast  Result Date: 08/13/2018 EXAM: CT ANGIOGRAPHY HEAD AND NECK CT PERFUSION BRAIN TECHNIQUE: Multidetector CT imaging of the head and neck was performed using the standard protocol during bolus administration of intravenous contrast. Multiplanar CT image reconstructions and MIPs were obtained to evaluate the vascular anatomy. Carotid stenosis measurements (when applicable) are obtained utilizing NASCET criteria, using the distal internal carotid diameter as the denominator. Multiphase CT imaging of the brain was performed following IV bolus contrast injection. Subsequent parametric perfusion maps were calculated using RAPID software. CONTRAST:  124mL OMNIPAQUE IOHEXOL 350 MG/ML SOLN COMPARISON:  Prior noncontrast head CT from 81 earlier the same day. FINDINGS: CTA NECK FINDINGS Aortic arch: Visualized aortic arch of normal caliber with normal 3 vessel morphology. Moderate atherosclerotic change about the arch itself. No hemodynamically significant stenosis about the origin of the great vessels. Visualized subclavian arteries widely patent. Right  carotid system: Right common carotid artery tortuous proximally but widely patent to the bifurcation. Minimal atheromatous irregularity about the right bifurcation without significant stenosis. Right ICA tortuous but widely patent  distally to the skull base without stenosis, dissection, or occlusion. Left carotid system: Left common carotid artery tortuous proximally but widely patent to the bifurcation without stenosis. Minimal atheromatous plaque about the left bifurcation without significant stenosis. Left ICA mildly tortuous but widely patent distally to the skull base without stenosis, dissection, or occlusion. Vertebral arteries: Both of the vertebral arteries arise from the subclavian arteries. Vertebral arteries mildly tortuous proximally but widely patent without stenosis, dissection, or occlusion. Skeleton: No acute osseous abnormality. No discrete lytic or blastic osseous lesions. Patient is edentulous. Other neck: No other acute abnormality within the neck. Upper chest: Visualized lungs are hyperexpanded with severe emphysematous changes. 6 mm spiculated nodule present at the left lung apex (series 5, image 34). Review of the MIP images confirms the above findings CTA HEAD FINDINGS Anterior circulation: Petrous segments widely patent bilaterally. Mild scattered atheromatous plaque within the cavernous/supraclinoid ICAs without significant stenosis. ICA termini widely patent. A1 segments well perfused bilaterally. Normal anterior communicating artery. Anterior cerebral arteries widely patent to their distal aspects. M1 segments widely patent bilaterally. No proximal M2 occlusion. Distal MCA branches well perfused and fairly symmetric. Posterior circulation: Vertebral arteries patent to the vertebrobasilar junction without stenosis. Left vertebral artery slightly dominant. Posterior inferior cerebral arteries patent bilaterally. Basilar widely patent to its distal aspect. Superior cerebral arteries patent bilaterally. Left PCA supplied via the basilar. Fetal type origin of the right PCA supplied via a widely patent right posterior communicating artery. PCAs widely patent to their distal aspects without stenosis. Venous sinuses:  Patent. Anatomic variants: Fetal type right PCA.  No intracranial aneurysm. Delayed phase: No abnormal enhancement. Evolving left posterior MCA cortical infarct noted (series 13, image 15). Review of the MIP images confirms the above findings CT Brain Perfusion Findings: CBF (<30%) Volume: 62mL Perfusion (Tmax>6.0s) volume: 60mL Mismatch Volume: -29mL Infarction Location:Small acute cortical infarct involving the posterior left MCA distribution at the posterior left frontal lobe. No surrounding perfusion deficit or penumbra. IMPRESSION: 1. Negative CTA for large vessel occlusion, with wide patency of the major arterial vasculature of the head and neck. 2. 3 mL core infarct at the posterior left frontotemporal region, posterior left MCA territory. No surrounding perfusion deficit or penumbra. 3. Mild atherosclerotic change for patient age without hemodynamically significant stenosis within the head and neck. 4. Severe emphysema. 5. **An incidental finding of potential clinical significance has been found. 6 mm spiculated nodule at the left lung apex, indeterminate. Non-contrast chest CT at 6-12 months is recommended. If the nodule is stable at time of repeat CT, then future CT at 18-24 months (from today's scan) is considered optional for low-risk patients, but is recommended for high-risk patients. This recommendation follows the consensus statement: Guidelines for Management of Incidental Pulmonary Nodules Detected on CT Images: From the Fleischner Society 2017; Radiology 2017; 284:228-243.** Critical Value/emergent results were called by telephone at the time of interpretation on 08/13/2018 at 9:50 pm to Dr. Clearnce Hasten, who verbally acknowledged these results. Electronically Signed   By: Jeannine Boga M.D.   On: 08/13/2018 22:11   Ct Head Wo Contrast  Result Date: 08/13/2018 CLINICAL DATA:  83 year old female with acute altered mental status and aphasia today. EXAM: CT HEAD WITHOUT CONTRAST TECHNIQUE:  Contiguous axial images were obtained from the base of the skull through the vertex without intravenous  contrast. COMPARISON:  06/02/2015 head CT FINDINGS: Brain: No evidence of acute infarction, hemorrhage, hydrocephalus, extra-axial collection or mass lesion/mass effect. Atrophy and chronic small-vessel white matter ischemic changes again noted. Vascular: Carotid atherosclerotic calcifications again identified. Skull: Normal. Negative for fracture or focal lesion. Sinuses/Orbits: No acute finding. Other: None. IMPRESSION: 1. No evidence of acute intracranial abnormality. 2. Atrophy and chronic small-vessel white matter ischemic changes. Electronically Signed   By: Margarette Canada M.D.   On: 08/13/2018 18:55   Ct Angio Neck W Or Wo Contrast  Result Date: 08/13/2018 EXAM: CT ANGIOGRAPHY HEAD AND NECK CT PERFUSION BRAIN TECHNIQUE: Multidetector CT imaging of the head and neck was performed using the standard protocol during bolus administration of intravenous contrast. Multiplanar CT image reconstructions and MIPs were obtained to evaluate the vascular anatomy. Carotid stenosis measurements (when applicable) are obtained utilizing NASCET criteria, using the distal internal carotid diameter as the denominator. Multiphase CT imaging of the brain was performed following IV bolus contrast injection. Subsequent parametric perfusion maps were calculated using RAPID software. CONTRAST:  170mL OMNIPAQUE IOHEXOL 350 MG/ML SOLN COMPARISON:  Prior noncontrast head CT from 81 earlier the same day. FINDINGS: CTA NECK FINDINGS Aortic arch: Visualized aortic arch of normal caliber with normal 3 vessel morphology. Moderate atherosclerotic change about the arch itself. No hemodynamically significant stenosis about the origin of the great vessels. Visualized subclavian arteries widely patent. Right carotid system: Right common carotid artery tortuous proximally but widely patent to the bifurcation. Minimal atheromatous irregularity about  the right bifurcation without significant stenosis. Right ICA tortuous but widely patent distally to the skull base without stenosis, dissection, or occlusion. Left carotid system: Left common carotid artery tortuous proximally but widely patent to the bifurcation without stenosis. Minimal atheromatous plaque about the left bifurcation without significant stenosis. Left ICA mildly tortuous but widely patent distally to the skull base without stenosis, dissection, or occlusion. Vertebral arteries: Both of the vertebral arteries arise from the subclavian arteries. Vertebral arteries mildly tortuous proximally but widely patent without stenosis, dissection, or occlusion. Skeleton: No acute osseous abnormality. No discrete lytic or blastic osseous lesions. Patient is edentulous. Other neck: No other acute abnormality within the neck. Upper chest: Visualized lungs are hyperexpanded with severe emphysematous changes. 6 mm spiculated nodule present at the left lung apex (series 5, image 34). Review of the MIP images confirms the above findings CTA HEAD FINDINGS Anterior circulation: Petrous segments widely patent bilaterally. Mild scattered atheromatous plaque within the cavernous/supraclinoid ICAs without significant stenosis. ICA termini widely patent. A1 segments well perfused bilaterally. Normal anterior communicating artery. Anterior cerebral arteries widely patent to their distal aspects. M1 segments widely patent bilaterally. No proximal M2 occlusion. Distal MCA branches well perfused and fairly symmetric. Posterior circulation: Vertebral arteries patent to the vertebrobasilar junction without stenosis. Left vertebral artery slightly dominant. Posterior inferior cerebral arteries patent bilaterally. Basilar widely patent to its distal aspect. Superior cerebral arteries patent bilaterally. Left PCA supplied via the basilar. Fetal type origin of the right PCA supplied via a widely patent right posterior communicating  artery. PCAs widely patent to their distal aspects without stenosis. Venous sinuses: Patent. Anatomic variants: Fetal type right PCA.  No intracranial aneurysm. Delayed phase: No abnormal enhancement. Evolving left posterior MCA cortical infarct noted (series 13, image 15). Review of the MIP images confirms the above findings CT Brain Perfusion Findings: CBF (<30%) Volume: 65mL Perfusion (Tmax>6.0s) volume: 6mL Mismatch Volume: -5mL Infarction Location:Small acute cortical infarct involving the posterior left MCA distribution at the posterior left  frontal lobe. No surrounding perfusion deficit or penumbra. IMPRESSION: 1. Negative CTA for large vessel occlusion, with wide patency of the major arterial vasculature of the head and neck. 2. 3 mL core infarct at the posterior left frontotemporal region, posterior left MCA territory. No surrounding perfusion deficit or penumbra. 3. Mild atherosclerotic change for patient age without hemodynamically significant stenosis within the head and neck. 4. Severe emphysema. 5. **An incidental finding of potential clinical significance has been found. 6 mm spiculated nodule at the left lung apex, indeterminate. Non-contrast chest CT at 6-12 months is recommended. If the nodule is stable at time of repeat CT, then future CT at 18-24 months (from today's scan) is considered optional for low-risk patients, but is recommended for high-risk patients. This recommendation follows the consensus statement: Guidelines for Management of Incidental Pulmonary Nodules Detected on CT Images: From the Fleischner Society 2017; Radiology 2017; 284:228-243.** Critical Value/emergent results were called by telephone at the time of interpretation on 08/13/2018 at 9:50 pm to Dr. Clearnce Hasten, who verbally acknowledged these results. Electronically Signed   By: Jeannine Boga M.D.   On: 08/13/2018 22:11   Ct Cerebral Perfusion W Contrast  Result Date: 08/13/2018 EXAM: CT ANGIOGRAPHY HEAD AND NECK CT  PERFUSION BRAIN TECHNIQUE: Multidetector CT imaging of the head and neck was performed using the standard protocol during bolus administration of intravenous contrast. Multiplanar CT image reconstructions and MIPs were obtained to evaluate the vascular anatomy. Carotid stenosis measurements (when applicable) are obtained utilizing NASCET criteria, using the distal internal carotid diameter as the denominator. Multiphase CT imaging of the brain was performed following IV bolus contrast injection. Subsequent parametric perfusion maps were calculated using RAPID software. CONTRAST:  134mL OMNIPAQUE IOHEXOL 350 MG/ML SOLN COMPARISON:  Prior noncontrast head CT from 81 earlier the same day. FINDINGS: CTA NECK FINDINGS Aortic arch: Visualized aortic arch of normal caliber with normal 3 vessel morphology. Moderate atherosclerotic change about the arch itself. No hemodynamically significant stenosis about the origin of the great vessels. Visualized subclavian arteries widely patent. Right carotid system: Right common carotid artery tortuous proximally but widely patent to the bifurcation. Minimal atheromatous irregularity about the right bifurcation without significant stenosis. Right ICA tortuous but widely patent distally to the skull base without stenosis, dissection, or occlusion. Left carotid system: Left common carotid artery tortuous proximally but widely patent to the bifurcation without stenosis. Minimal atheromatous plaque about the left bifurcation without significant stenosis. Left ICA mildly tortuous but widely patent distally to the skull base without stenosis, dissection, or occlusion. Vertebral arteries: Both of the vertebral arteries arise from the subclavian arteries. Vertebral arteries mildly tortuous proximally but widely patent without stenosis, dissection, or occlusion. Skeleton: No acute osseous abnormality. No discrete lytic or blastic osseous lesions. Patient is edentulous. Other neck: No other acute  abnormality within the neck. Upper chest: Visualized lungs are hyperexpanded with severe emphysematous changes. 6 mm spiculated nodule present at the left lung apex (series 5, image 34). Review of the MIP images confirms the above findings CTA HEAD FINDINGS Anterior circulation: Petrous segments widely patent bilaterally. Mild scattered atheromatous plaque within the cavernous/supraclinoid ICAs without significant stenosis. ICA termini widely patent. A1 segments well perfused bilaterally. Normal anterior communicating artery. Anterior cerebral arteries widely patent to their distal aspects. M1 segments widely patent bilaterally. No proximal M2 occlusion. Distal MCA branches well perfused and fairly symmetric. Posterior circulation: Vertebral arteries patent to the vertebrobasilar junction without stenosis. Left vertebral artery slightly dominant. Posterior inferior cerebral arteries patent bilaterally. Basilar widely  patent to its distal aspect. Superior cerebral arteries patent bilaterally. Left PCA supplied via the basilar. Fetal type origin of the right PCA supplied via a widely patent right posterior communicating artery. PCAs widely patent to their distal aspects without stenosis. Venous sinuses: Patent. Anatomic variants: Fetal type right PCA.  No intracranial aneurysm. Delayed phase: No abnormal enhancement. Evolving left posterior MCA cortical infarct noted (series 13, image 15). Review of the MIP images confirms the above findings CT Brain Perfusion Findings: CBF (<30%) Volume: 96mL Perfusion (Tmax>6.0s) volume: 31mL Mismatch Volume: -45mL Infarction Location:Small acute cortical infarct involving the posterior left MCA distribution at the posterior left frontal lobe. No surrounding perfusion deficit or penumbra. IMPRESSION: 1. Negative CTA for large vessel occlusion, with wide patency of the major arterial vasculature of the head and neck. 2. 3 mL core infarct at the posterior left frontotemporal region,  posterior left MCA territory. No surrounding perfusion deficit or penumbra. 3. Mild atherosclerotic change for patient age without hemodynamically significant stenosis within the head and neck. 4. Severe emphysema. 5. **An incidental finding of potential clinical significance has been found. 6 mm spiculated nodule at the left lung apex, indeterminate. Non-contrast chest CT at 6-12 months is recommended. If the nodule is stable at time of repeat CT, then future CT at 18-24 months (from today's scan) is considered optional for low-risk patients, but is recommended for high-risk patients. This recommendation follows the consensus statement: Guidelines for Management of Incidental Pulmonary Nodules Detected on CT Images: From the Fleischner Society 2017; Radiology 2017; 284:228-243.** Critical Value/emergent results were called by telephone at the time of interpretation on 08/13/2018 at 9:50 pm to Dr. Clearnce Hasten, who verbally acknowledged these results. Electronically Signed   By: Jeannine Boga M.D.   On: 08/13/2018 22:11   ASSESSMENT AND PLAN:   Acute left frontotemporal CVA-seen on CTA head.  Patient does have a history of paroxysmal atrial fibrillation, not on anticoagulation. Speech has improved. -Neurology consulted -MRI brain pending -Echo pending -Continue aspirin and lipitor -LDL 71 -PT/OT/SLP -Patient will likely need to be started on oral anticoagulation  Hypokalemia- K 2.9. mag normal. 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 Management plans discussed with the patient, family and they are in agreement.  CODE STATUS: Full Code  TOTAL TIME TAKING CARE OF THIS PATIENT: 40 minutes.   More than 50% of the time was spent in counseling/coordination of care: YES  POSSIBLE D/C IN 1-2 DAYS, DEPENDING ON CLINICAL CONDITION.   Berna Spare Toiya Morrish M.D on 08/14/2018 at 5:17 PM  Between 7am to 6pm - Pager - [phone]  After 6pm go to [email or web address] - password [email or web address] [facility]  [employer] [facility]  Office  [phone]  CC: Primary care physician; Adin Hector, MD  Note: This dictation was prepared with Dragon dictation along with smaller phrase technology. Any transcriptional errors that result from this process are unintentional.

## 2018-08-14 NOTE — Progress Notes (Signed)
Speech Therapy Note: reviewed chart notes; BSE completed today and pt eager to eat a meal. Family present. Pt is verbally communicating w/ staff and family in room; intermittent hesitations and word finding deficits noted but not overly impacting in general conversation when pt given time to express herself.  ST services will f/u tomorrow w/ Cog-Linguistic evaluation. Encouraged pt to slow down when talking; family to reduce distractions. NSG updated, agreed.  Orinda Kenner, [place], CCC-SLP

## 2018-08-14 NOTE — Plan of Care (Signed)
Problem: Spiritual Needs Goal: Ability to function at adequate level 08/14/2018 0556 by Wallace Going, RN Outcome: Progressing 08/14/2018 0555 by Wallace Going, RN Outcome: Progressing   Problem: Education: Goal: Knowledge of disease or condition will improve 08/14/2018 0556 by Wallace Going, RN Outcome: Progressing 08/14/2018 0555 by Wallace Going, RN Outcome: Progressing Goal: Knowledge of secondary prevention will improve 08/14/2018 0556 by Wallace Going, RN Outcome: Progressing 08/14/2018 0555 by Wallace Going, RN Outcome: Progressing Goal: Knowledge of patient specific risk factors addressed and post discharge goals established will improve 08/14/2018 0556 by Wallace Going, RN Outcome: Progressing 08/14/2018 0555 by Wallace Going, RN Outcome: Progressing Goal: Individualized Educational Video(s) 08/14/2018 0556 by Wallace Going, RN Outcome: Progressing 08/14/2018 0555 by Wallace Going, RN Outcome: Progressing   Problem: Coping: Goal: Will verbalize positive feelings about self 08/14/2018 0556 by Wallace Going, RN Outcome: Progressing 08/14/2018 0555 by Wallace Going, RN Outcome: Progressing Goal: Will identify appropriate support needs 08/14/2018 0556 by Wallace Going, RN Outcome: Progressing 08/14/2018 0555 by Wallace Going, RN Outcome: Progressing   Problem: Health Behavior/Discharge Planning: Goal: Ability to manage health-related needs will improve 08/14/2018 0556 by Wallace Going, RN Outcome: Progressing 08/14/2018 0555 by Wallace Going, RN Outcome: Progressing   Problem: Self-Care: Goal: Ability to participate in self-care as condition permits will improve 08/14/2018 0556 by Wallace Going, RN Outcome: Progressing 08/14/2018 0555 by Wallace Going, RN Outcome: Progressing Goal: Verbalization of feelings and concerns over difficulty with self-care will improve 08/14/2018 0556 by Wallace Going, RN Outcome: Progressing 08/14/2018 0555 by Wallace Going,  RN Outcome: Progressing Goal: Ability to communicate needs accurately will improve 08/14/2018 0556 by Wallace Going, RN Outcome: Progressing 08/14/2018 0555 by Wallace Going, RN Outcome: Progressing   Problem: Nutrition: Goal: Risk of aspiration will decrease 08/14/2018 0556 by Wallace Going, RN Outcome: Not Progressing 08/14/2018 0555 by Wallace Going, RN Outcome: Progressing Goal: Dietary intake will improve 08/14/2018 0556 by Wallace Going, RN Outcome: Not Progressing 08/14/2018 0555 by Wallace Going, RN Outcome: Progressing   Problem: Self-Care: Goal: Ability to participate in self-care as condition permits will improve 08/14/2018 0556 by Wallace Going, RN Outcome: Progressing 08/14/2018 0555 by Wallace Going, RN Outcome: Progressing Goal: Verbalization of feelings and concerns over difficulty with self-care will improve 08/14/2018 0556 by Wallace Going, RN Outcome: Progressing 08/14/2018 0555 by Wallace Going, RN Outcome: Progressing Goal: Ability to communicate needs accurately will improve 08/14/2018 0556 by Wallace Going, RN Outcome: Progressing 08/14/2018 0555 by Wallace Going, RN Outcome: Progressing   Problem: Ischemic Stroke/TIA Tissue Perfusion: Goal: Complications of ischemic stroke/TIA will be minimized 08/14/2018 0556 by Wallace Going, RN Outcome: Progressing 08/14/2018 0555 by Wallace Going, RN Outcome: Progressing   Problem: Education: Goal: Knowledge of General Education information will improve Description Including pain rating scale, medication(s)/side effects and non-pharmacologic comfort measures 08/14/2018 0556 by Wallace Going, RN Outcome: Progressing 08/14/2018 0555 by Wallace Going, RN Outcome: Progressing   Problem: Health Behavior/Discharge Planning: Goal: Ability to manage health-related needs will improve 08/14/2018 0556 by Wallace Going, RN Outcome: Progressing 08/14/2018 0555 by Wallace Going, RN Outcome: Progressing    Problem: Clinical Measurements: Goal: Ability to maintain clinical measurements within normal limits will improve 08/14/2018 0556 by Wallace Going, RN Outcome: Progressing 08/14/2018 0555 by Wallace Going, RN Outcome: Progressing Goal: Will remain free  from infection 08/14/2018 0556 by Wallace Going, RN Outcome: Progressing 08/14/2018 0555 by Wallace Going, RN Outcome: Progressing Goal: Diagnostic test results will improve 08/14/2018 0556 by Wallace Going, RN Outcome: Progressing 08/14/2018 0555 by Wallace Going, RN Outcome: Progressing Goal: Respiratory complications will improve 08/14/2018 0556 by Wallace Going, RN Outcome: Progressing 08/14/2018 0555 by Wallace Going, RN Outcome: Progressing Goal: Cardiovascular complication will be avoided 08/14/2018 0556 by Wallace Going, RN Outcome: Progressing 08/14/2018 0555 by Wallace Going, RN Outcome: Progressing   Problem: Activity: Goal: Risk for activity intolerance will decrease 08/14/2018 0556 by Wallace Going, RN Outcome: Progressing 08/14/2018 0555 by Wallace Going, RN Outcome: Progressing   Problem: Nutrition: Goal: Adequate nutrition will be maintained 08/14/2018 0556 by Wallace Going, RN Outcome: Progressing 08/14/2018 0555 by Wallace Going, RN Outcome: Progressing   Problem: Coping: Goal: Level of anxiety will decrease 08/14/2018 0556 by Wallace Going, RN Outcome: Progressing 08/14/2018 0555 by Wallace Going, RN Outcome: Progressing   Problem: Elimination: Goal: Will not experience complications related to bowel motility 08/14/2018 0556 by Wallace Going, RN Outcome: Progressing 08/14/2018 0555 by Wallace Going, RN Outcome: Progressing Goal: Will not experience complications related to urinary retention 08/14/2018 0556 by Wallace Going, RN Outcome: Progressing 08/14/2018 0555 by Wallace Going, RN Outcome: Progressing   Problem: Pain Managment: Goal: General experience of comfort will  improve 08/14/2018 0556 by Wallace Going, RN Outcome: Progressing 08/14/2018 0555 by Wallace Going, RN Outcome: Progressing   Problem: Safety: Goal: Ability to remain free from injury will improve 08/14/2018 0556 by Wallace Going, RN Outcome: Progressing 08/14/2018 0555 by Wallace Going, RN Outcome: Progressing   Problem: Skin Integrity: Goal: Risk for impaired skin integrity will decrease 08/14/2018 0556 by Wallace Going, RN Outcome: Progressing 08/14/2018 0555 by Wallace Going, RN Outcome: Progressing

## 2018-08-14 NOTE — Progress Notes (Signed)
*  PRELIMINARY RESULTS* Echocardiogram 2D Echocardiogram has been performed.  Veronica Wang 08/14/2018, 9:10 AM

## 2018-08-14 NOTE — Care Management Note (Signed)
Case Management Note  Patient Details  Name: Dyamond Tolosa MRN: [id number] Date of Birth: 1936-01-02  Subjective/Objective:                  Admitted to [facility] with the diagnosis of stroke. Lives alone. Daughter is Judeen Hammans [phone]). Prescriptions are filled at [facility] in [place], Seen Dr. Caryl Comes in January. Home health in the past, Doesn't remember name of agency. No skilled nursing. Home oxygen x 15 years, Uses 3 liters at night only. Oxygen is provided by [facility]. No other medical equipment in the home.  Takes care of all basic activities of daily living herself, drives. No falls. Appetite goes up and down. Daughter will transport.  Action/Plan: Physical therapy is recommending home with home health/physical therapy.  Medicare government home health query completed. One copy given to Ms. Covel . One copy placed on chart. Discussed agencies,. [facility]. Will update Floydene Flock, [facility] representative updated.   Expected Discharge Date:                  Expected Discharge Plan:     In-House Referral:   yes  Discharge planning Services   yes  Post Acute Care Choice:   yes Choice offered to:   patient  DME Arranged:    DME Agency:     HH Arranged:   yes HH Agency:   [facility]  Status of Service:     If discussed at [place] of Stay Meetings, dates discussed:    Additional Comments:  Shelbie Ammons, RN  MSN CCM Care Management [phone] 08/14/2018, 2:46 PM
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
## 2018-08-15 DIAGNOSIS — I639 Cerebral infarction, unspecified: Secondary | ICD-10-CM

## 2018-08-15 LAB — BASIC METABOLIC PANEL
Anion gap: 6 (ref 5–15)
BUN: 16 mg/dL (ref 8–23)
CO2: 28 mmol/L (ref 22–32)
Calcium: 8.5 mg/dL — ABNORMAL LOW (ref 8.9–10.3)
Chloride: 106 mmol/L (ref 98–111)
Creatinine, Ser: 0.51 mg/dL (ref 0.44–1.00)
GFR calc Af Amer: >60 mL/min (ref >60)
GFR calc non Af Amer: >60 mL/min (ref >60)
Glucose, Bld: 109 mg/dL — ABNORMAL HIGH (ref 70–99)
POTASSIUM: 3.8 mmol/L (ref 3.5–5.1)
Sodium: 140 mmol/L (ref 135–145)

## 2018-08-15 MED ORDER — GUAIFENESIN-DM 100-10 MG/5ML PO SYRP
5.0000 mL | ORAL_SOLUTION | ORAL | Status: DC | PRN
  Filled 2018-08-15 (×2): qty 5

## 2018-08-15 MED ORDER — ATORVASTATIN CALCIUM 40 MG PO TABS: 40.0000 mg | ORAL_TABLET | Freq: Every day | ORAL | 0 refills | Status: DC

## 2018-08-15 MED ORDER — LORATADINE 10 MG PO TABS: 10.0000 mg | ORAL_TABLET | Freq: Every day | ORAL | Status: DC | PRN

## 2018-08-15 NOTE — Progress Notes (Signed)
Brief History Veronica Wang is an 83 y.o. female with past medical history significant for hypertension, CHF, hyperlipidemia, paroxysmal atrial fibrillation not on anticoagulation, liver CA who presented to the hospital complaining about difficulty speaking, In the emergency room patient had a CT scan of the brain which shows no acute intracranial abnormalities, she had a CTA and CT perfusion which showed no large vessel occlusion however showed some evidence for left MCA territory infarction, patient was admitted to the hospital for stroke work-up.   Subjective Feels better, speech is improving   Past Medical History Past Medical History:  Diagnosis Date  . Abnormal MRI 09/10/2015  . Atrial fibrillation ([place])   . Cancer ([place])    Liver  . 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 Carcinoid tumor of intestine   . CHF (congestive heart failure) ([place])   . COPD (chronic obstructive pulmonary disease) ([place])   . FH: chemotherapy   . GERD (gastroesophageal reflux disease)   . Hyperlipidemia   . Hypertension   . Macular degeneration   . Polycythemia   . Radiation     Past Surgical History Past Surgical History:  Procedure Laterality Date  . CATARACT EXTRACTION  splenectomy  . COLON RESECTION    . COLON SURGERY    . GASTRECTOMY    . SPLENECTOMY      Allergies Allergies  Allergen Reactions  . No Known Allergies     Home Medications Medications Prior to Admission  Medication Sig Dispense Refill  . acetaminophen (TYLENOL) 325 MG tablet Take 2 tablets (650 mg total) by mouth every 6 (six) hours as needed for mild pain (or Fever >/= 101). 30 tablet 1  . albuterol (PROVENTIL HFA;VENTOLIN HFA) 108 (90 BASE) MCG/ACT inhaler Inhale 2 puffs into the lungs every 6 (six) hours as needed for wheezing or shortness of breath.    Marland Kitchen aspirin EC 81 MG tablet Take 81 mg by mouth daily.     . Cholecalciferol (VITAMIN D-1000 MAX ST) 1000 UNITS tablet Take 1,000 Units by mouth daily.     . Cyanocobalamin  (VITAMIN B-12 CR) 1000 MCG TBCR Take 1 tablet by mouth daily.     Marland Kitchen lisinopril (PRINIVIL,ZESTRIL) 5 MG tablet Take 5 mg by mouth daily.    Marland Kitchen loperamide (IMODIUM A-D) 2 MG tablet Take 4 mg by mouth 4 (four) times daily as needed for diarrhea or loose stools.    . Multiple Vitamins-Minerals (CENTRUM SILVER) tablet Take 1 tablet by mouth daily.     . ondansetron (ZOFRAN) 8 MG tablet Take 1 tablet (8 mg total) by mouth every 8 (eight) hours as needed for nausea or vomiting. 30 tablet 0  . prochlorperazine (COMPAZINE) 10 MG tablet Take 1 tablet (10 mg total) by mouth every 6 (six) hours as needed for nausea or vomiting. 30 tablet 0  . Telotristat Etiprate 250 MG TABS Take 1 tablet by mouth 3 (three) times daily.    . famotidine (PEPCID) 20 MG tablet Take 20 mg by mouth daily.     . feeding supplement (BOOST HIGH PROTEIN) LIQD Take 1 Container by mouth 3 (three) times daily between meals.    . furosemide (LASIX) 20 MG tablet Take 20 mg by mouth daily.     Marland Kitchen lidocaine (XYLOCAINE) 2 % solution Use as directed 15 mLs in the mouth or throat every 6 (six) hours as needed (chest/stomach pain). (Patient not taking: Reported on 08/13/2018) 100 mL 0  . potassium chloride SA (K-DUR,KLOR-CON) 20 MEQ tablet  Take 2 tablets (40 mEq total) by mouth 2 (two) times daily. (Patient not taking: Reported on 01/03/2018) 30 tablet 0  . sucralfate (CARAFATE) 1 g tablet Take 1 tablet (1 g total) by mouth 4 (four) times daily. (Patient not taking: Reported on 08/13/2018) 60 tablet 0    Hospital Medications . aspirin EC  81 mg Oral Daily  . atorvastatin  40 mg Oral q1800  . enoxaparin (LOVENOX) injection  30 mg Subcutaneous Q24H  . famotidine  20 mg Oral Daily  . feeding supplement (ENSURE ENLIVE)  237 mL Oral BID BM  . multivitamin with minerals  1 tablet Oral Daily  . Telotristat Etiprate  250 mg Oral TID AC      Intake/Output from previous day: 02/05 0701 - 02/06 0700 In: 1352.4 [P.O.:240; I.V.:1012.4; IV  Piggyback:100] Out: -  Intake/Output this shift: No intake/output data recorded. Nutritional status:  Diet Order            DIET DYS 3 Room service appropriate? Yes with Assist; Fluid consistency: Thin  Diet effective now               Physical Exam  Vitals:   08/14/18 1948 08/15/18 0500 08/15/18 0800 08/15/18 0805  BP: 132/74 (!) 142/88 (!) 150/85 132/78  Pulse: 76 83 83   Resp: 18 18 20    Temp: 98 F (36.7 C) 98.1 F (36.7 C) 98.5 F (36.9 C)   TempSrc: Oral  Oral   SpO2: 96% 97% 96%   Weight:      Height:       General - NAD Heart - Regular rate and rhythm - no murmer Lungs - Clear to auscultation Abdomen - Soft - non tender Extremities - Distal pulses intact - no edema Skin - Warm and dry  Neurologic Exam:  Alert, oriented to self and place, speech intact, no aphasia  follows commands with intact naming   Visual fields grossly normal, pupils equal, round, reactive to light extra-ocular motions intact bilaterally smile symmetric, facial light touch sensation normal bilaterally midline tongue extension Motor: RUE - 5/5                                            LUE - 5/5   RLE - 5/5                                             LLE - 5/5 Sensory: Light touch intact throughout, bilaterally   LABORATORY RESULTS:  Basic Metabolic Panel: Recent Labs  Lab 08/13/18 1813 08/14/18 0415 08/14/18 1741 08/15/18 0508  NA 142  --   --  140  K 2.9* 2.9* 3.5 3.8  CL 105  --   --  106  CO2 30  --   --  28  GLUCOSE 94  --   --  109*  BUN 14  --   --  16  CREATININE 0.67 0.57  --  0.51  CALCIUM 8.8*  --   --  8.5*  MG  --  2.0  --   --     Liver Function Tests: Recent Labs  Lab 08/13/18 1813  AST 27  ALT 16  ALKPHOS 107  BILITOT 0.5  PROT 6.3*  ALBUMIN 3.3*  No results for input(s): LIPASE, AMYLASE in the last 168 hours. No results for input(s): AMMONIA in the last 168 hours.  CBC: Recent Labs  Lab 08/13/18 1813 08/14/18 0415  WBC 10.0 10.0   NEUTROABS 6.6  --   HGB 13.8 12.8  HCT 43.2 39.3  MCV 82.3 82.2  PLT 300 262    Cardiac Enzymes: Recent Labs  Lab 08/13/18 1813  TROPONINI 0.03*    Lipid Panel: Recent Labs  Lab 08/14/18 0415  CHOL 158  TRIG 89  HDL 69  CHOLHDL 2.3  VLDL 18  LDLCALC 71    CBG: Recent Labs  Lab 08/13/18 [place] 83    Microbiology:   Coagulation Studies: Recent Labs    08/13/18 1813  LABPROT 13.3  INR 1.02    Miscellaneous Labs:   IMAGING RESULTS Ct Angio Head W Or Wo Contrast  Result Date: 08/13/2018 EXAM: CT ANGIOGRAPHY HEAD AND NECK CT PERFUSION BRAIN TECHNIQUE: Multidetector CT imaging of the head and neck was performed using the standard protocol during bolus administration of intravenous contrast. Multiplanar CT image reconstructions and MIPs were obtained to evaluate the vascular anatomy. Carotid stenosis measurements (when applicable) are obtained utilizing NASCET criteria, using the distal internal carotid diameter as the denominator. Multiphase CT imaging of the brain was performed following IV bolus contrast injection. Subsequent parametric perfusion maps were calculated using RAPID software. CONTRAST:  128mL OMNIPAQUE IOHEXOL 350 MG/ML SOLN COMPARISON:  Prior noncontrast head CT from earlier the same day. FINDINGS: CTA NECK FINDINGS Aortic arch: Visualized aortic arch of normal caliber with normal 3 vessel morphology. Moderate atherosclerotic change about the arch itself. No hemodynamically significant stenosis about the origin of the great vessels. Visualized subclavian arteries widely patent. Right carotid system: Right common carotid artery tortuous proximally but widely patent to the bifurcation. Minimal atheromatous irregularity about the right bifurcation without significant stenosis. Right ICA tortuous but widely patent distally to the skull base without stenosis, dissection, or occlusion. Left carotid system: Left common carotid artery tortuous proximally but  widely patent to the bifurcation without stenosis. Minimal atheromatous plaque about the left bifurcation without significant stenosis. Left ICA mildly tortuous but widely patent distally to the skull base without stenosis, dissection, or occlusion. Vertebral arteries: Both of the vertebral arteries arise from the subclavian arteries. Vertebral arteries mildly tortuous proximally but widely patent without stenosis, dissection, or occlusion. Skeleton: No acute osseous abnormality. No discrete lytic or blastic osseous lesions. Patient is edentulous. Other neck: No other acute abnormality within the neck. Upper chest: Visualized lungs are hyperexpanded with severe emphysematous changes. 6 mm spiculated nodule present at the left lung apex (series 5, image 34). Review of the MIP images confirms the above findings CTA HEAD FINDINGS Anterior circulation: Petrous segments widely patent bilaterally. Mild scattered atheromatous plaque within the cavernous/supraclinoid ICAs without significant stenosis. ICA termini widely patent. A1 segments well perfused bilaterally. Normal anterior communicating artery. Anterior cerebral arteries widely patent to their distal aspects. M1 segments widely patent bilaterally. No proximal M2 occlusion. Distal MCA branches well perfused and fairly symmetric. Posterior circulation: Vertebral arteries patent to the vertebrobasilar junction without stenosis. Left vertebral artery slightly dominant. Posterior inferior cerebral arteries patent bilaterally. Basilar widely patent to its distal aspect. Superior cerebral arteries patent bilaterally. Left PCA supplied via the basilar. Fetal type origin of the right PCA supplied via a widely patent right posterior communicating artery. PCAs widely patent to their distal aspects without stenosis. Venous sinuses: Patent. Anatomic variants: Fetal type right PCA.  No intracranial aneurysm. Delayed phase: No abnormal enhancement. Evolving left posterior MCA  cortical infarct noted (series 13, image 15). Review of the MIP images confirms the above findings CT Brain Perfusion Findings: CBF (<30%) Volume: 58mL Perfusion (Tmax>6.0s) volume: 64mL Mismatch Volume: -64mL Infarction Location:Small acute cortical infarct involving the posterior left MCA distribution at the posterior left frontal lobe. No surrounding perfusion deficit or penumbra. IMPRESSION: 1. Negative CTA for large vessel occlusion, with wide patency of the major arterial vasculature of the head and neck. 2. 3 mL core infarct at the posterior left frontotemporal region, posterior left MCA territory. No surrounding perfusion deficit or penumbra. 3. Mild atherosclerotic change for patient age without hemodynamically significant stenosis within the head and neck. 4. Severe emphysema. 5. **An incidental finding of potential clinical significance has been found. 6 mm spiculated nodule at the left lung apex, indeterminate. Non-contrast chest CT at 6-12 months is recommended. If the nodule is stable at time of repeat CT, then future CT at 18-24 months (from today's scan) is considered optional for low-risk patients, but is recommended for high-risk patients. This recommendation follows the consensus statement: Guidelines for Management of Incidental Pulmonary Nodules Detected on CT Images: From the Fleischner Society 2017; Radiology 2017; 284:228-243.** Critical Value/emergent results were called by telephone at the time of interpretation on 08/13/2018 at 9:50 pm to Dr. Clearnce Hasten, who verbally acknowledged these results. Electronically Signed   By: Jeannine Boga M.D.   On: 08/13/2018 22:11   Ct Head Wo Contrast  Result Date: 08/13/2018 CLINICAL DATA:  83 year old female with acute altered mental status and aphasia today. EXAM: CT HEAD WITHOUT CONTRAST TECHNIQUE: Contiguous axial images were obtained from the base of the skull through the vertex without intravenous contrast. COMPARISON:  06/02/2015 head CT  FINDINGS: Brain: No evidence of acute infarction, hemorrhage, hydrocephalus, extra-axial collection or mass lesion/mass effect. Atrophy and chronic small-vessel white matter ischemic changes again noted. Vascular: Carotid atherosclerotic calcifications again identified. Skull: Normal. Negative for fracture or focal lesion. Sinuses/Orbits: No acute finding. Other: None. IMPRESSION: 1. No evidence of acute intracranial abnormality. 2. Atrophy and chronic small-vessel white matter ischemic changes. Electronically Signed   By: Margarette Canada M.D.   On: 08/13/2018 18:55   Ct Angio Neck W Or Wo Contrast  Result Date: 08/13/2018 EXAM: CT ANGIOGRAPHY HEAD AND NECK CT PERFUSION BRAIN TECHNIQUE: Multidetector CT imaging of the head and neck was performed using the standard protocol during bolus administration of intravenous contrast. Multiplanar CT image reconstructions and MIPs were obtained to evaluate the vascular anatomy. Carotid stenosis measurements (when applicable) are obtained utilizing NASCET criteria, using the distal internal carotid diameter as the denominator. Multiphase CT imaging of the brain was performed following IV bolus contrast injection. Subsequent parametric perfusion maps were calculated using RAPID software. CONTRAST:  188mL OMNIPAQUE IOHEXOL 350 MG/ML SOLN COMPARISON:  Prior noncontrast head CT from earlier the same day. FINDINGS: CTA NECK FINDINGS Aortic arch: Visualized aortic arch of normal caliber with normal 3 vessel morphology. Moderate atherosclerotic change about the arch itself. No hemodynamically significant stenosis about the origin of the great vessels. Visualized subclavian arteries widely patent. Right carotid system: Right common carotid artery tortuous proximally but widely patent to the bifurcation. Minimal atheromatous irregularity about the right bifurcation without significant stenosis. Right ICA tortuous but widely patent distally to the skull base without stenosis, dissection,  or occlusion. Left carotid system: Left common carotid artery tortuous proximally but widely patent to the bifurcation without stenosis. Minimal atheromatous plaque about the  left bifurcation without significant stenosis. Left ICA mildly tortuous but widely patent distally to the skull base without stenosis, dissection, or occlusion. Vertebral arteries: Both of the vertebral arteries arise from the subclavian arteries. Vertebral arteries mildly tortuous proximally but widely patent without stenosis, dissection, or occlusion. Skeleton: No acute osseous abnormality. No discrete lytic or blastic osseous lesions. Patient is edentulous. Other neck: No other acute abnormality within the neck. Upper chest: Visualized lungs are hyperexpanded with severe emphysematous changes. 6 mm spiculated nodule present at the left lung apex (series 5, image 34). Review of the MIP images confirms the above findings CTA HEAD FINDINGS Anterior circulation: Petrous segments widely patent bilaterally. Mild scattered atheromatous plaque within the cavernous/supraclinoid ICAs without significant stenosis. ICA termini widely patent. A1 segments well perfused bilaterally. Normal anterior communicating artery. Anterior cerebral arteries widely patent to their distal aspects. M1 segments widely patent bilaterally. No proximal M2 occlusion. Distal MCA branches well perfused and fairly symmetric. Posterior circulation: Vertebral arteries patent to the vertebrobasilar junction without stenosis. Left vertebral artery slightly dominant. Posterior inferior cerebral arteries patent bilaterally. Basilar widely patent to its distal aspect. Superior cerebral arteries patent bilaterally. Left PCA supplied via the basilar. Fetal type origin of the right PCA supplied via a widely patent right posterior communicating artery. PCAs widely patent to their distal aspects without stenosis. Venous sinuses: Patent. Anatomic variants: Fetal type right PCA.  No  intracranial aneurysm. Delayed phase: No abnormal enhancement. Evolving left posterior MCA cortical infarct noted (series 13, image 15). Review of the MIP images confirms the above findings CT Brain Perfusion Findings: CBF (<30%) Volume: 32mL Perfusion (Tmax>6.0s) volume: 72mL Mismatch Volume: -68mL Infarction Location:Small acute cortical infarct involving the posterior left MCA distribution at the posterior left frontal lobe. No surrounding perfusion deficit or penumbra. IMPRESSION: 1. Negative CTA for large vessel occlusion, with wide patency of the major arterial vasculature of the head and neck. 2. 3 mL core infarct at the posterior left frontotemporal region, posterior left MCA territory. No surrounding perfusion deficit or penumbra. 3. Mild atherosclerotic change for patient age without hemodynamically significant stenosis within the head and neck. 4. Severe emphysema. 5. **An incidental finding of potential clinical significance has been found. 6 mm spiculated nodule at the left lung apex, indeterminate. Non-contrast chest CT at 6-12 months is recommended. If the nodule is stable at time of repeat CT, then future CT at 18-24 months (from today's scan) is considered optional for low-risk patients, but is recommended for high-risk patients. This recommendation follows the consensus statement: Guidelines for Management of Incidental Pulmonary Nodules Detected on CT Images: From the Fleischner Society 2017; Radiology 2017; 284:228-243.** Critical Value/emergent results were called by telephone at the time of interpretation on 08/13/2018 at 9:50 pm to Dr. Clearnce Hasten, who verbally acknowledged these results. Electronically Signed   By: Jeannine Boga M.D.   On: 08/13/2018 22:11   Mr Brain Wo Contrast  Result Date: 08/14/2018 CLINICAL DATA:  83 y/o  F; episode of aphasia, confusion, headache. EXAM: MRI HEAD WITHOUT CONTRAST TECHNIQUE: Multiplanar, multiecho pulse sequences of the brain and surrounding structures  were obtained without intravenous contrast. COMPARISON:  08/13/2018 CT head and CTA head. FINDINGS: Brain: Left posterior temporal lobe and lateral occipital lobe reduced diffusion measuring 6.4 x 2.4 x 1.9 cm (volume = 15 cm^3) compatible with late acute/early subacute infarction. Confluent petechial hemorrhage. No signal mass effect. No mass effect, extra-axial collection, hydrocephalus, herniation. Early confluent nonspecific T2 FLAIR hyperintensities in subcortical and periventricular white matter are compatible  with moderate chronic microvascular ischemic changes. Moderate volume loss of the brain. Vascular: Normal flow voids. Skull and upper cervical spine: Normal marrow signal. Sinuses/Orbits: Negative. Other: Left intra-ocular lens replacement. IMPRESSION: 1. Left posterior temporal and lateral occipital lobe late acute/early subacute infarct, 15 cc. Acute hemorrhage, [place] calssification 1b: HI2, confluent petechiae, no mass effect. 2. Moderate chronic microvascular ischemic changes and volume loss of the brain. These results will be called to the ordering clinician or representative by the Radiologist Assistant, and communication documented in the PACS or zVision Dashboard. Electronically Signed   By: Kristine Garbe M.D.   On: 08/14/2018 20:33   Ct Cerebral Perfusion W Contrast  Result Date: 08/13/2018 EXAM: CT ANGIOGRAPHY HEAD AND NECK CT PERFUSION BRAIN TECHNIQUE: Multidetector CT imaging of the head and neck was performed using the standard protocol during bolus administration of intravenous contrast. Multiplanar CT image reconstructions and MIPs were obtained to evaluate the vascular anatomy. Carotid stenosis measurements (when applicable) are obtained utilizing NASCET criteria, using the distal internal carotid diameter as the denominator. Multiphase CT imaging of the brain was performed following IV bolus contrast injection. Subsequent parametric perfusion maps were calculated using  RAPID software. CONTRAST:  149mL OMNIPAQUE IOHEXOL 350 MG/ML SOLN COMPARISON:  Prior noncontrast head CT from earlier the same day. FINDINGS: CTA NECK FINDINGS Aortic arch: Visualized aortic arch of normal caliber with normal 3 vessel morphology. Moderate atherosclerotic change about the arch itself. No hemodynamically significant stenosis about the origin of the great vessels. Visualized subclavian arteries widely patent. Right carotid system: Right common carotid artery tortuous proximally but widely patent to the bifurcation. Minimal atheromatous irregularity about the right bifurcation without significant stenosis. Right ICA tortuous but widely patent distally to the skull base without stenosis, dissection, or occlusion. Left carotid system: Left common carotid artery tortuous proximally but widely patent to the bifurcation without stenosis. Minimal atheromatous plaque about the left bifurcation without significant stenosis. Left ICA mildly tortuous but widely patent distally to the skull base without stenosis, dissection, or occlusion. Vertebral arteries: Both of the vertebral arteries arise from the subclavian arteries. Vertebral arteries mildly tortuous proximally but widely patent without stenosis, dissection, or occlusion. Skeleton: No acute osseous abnormality. No discrete lytic or blastic osseous lesions. Patient is edentulous. Other neck: No other acute abnormality within the neck. Upper chest: Visualized lungs are hyperexpanded with severe emphysematous changes. 6 mm spiculated nodule present at the left lung apex (series 5, image 34). Review of the MIP images confirms the above findings CTA HEAD FINDINGS Anterior circulation: Petrous segments widely patent bilaterally. Mild scattered atheromatous plaque within the cavernous/supraclinoid ICAs without significant stenosis. ICA termini widely patent. A1 segments well perfused bilaterally. Normal anterior communicating artery. Anterior cerebral arteries  widely patent to their distal aspects. M1 segments widely patent bilaterally. No proximal M2 occlusion. Distal MCA branches well perfused and fairly symmetric. Posterior circulation: Vertebral arteries patent to the vertebrobasilar junction without stenosis. Left vertebral artery slightly dominant. Posterior inferior cerebral arteries patent bilaterally. Basilar widely patent to its distal aspect. Superior cerebral arteries patent bilaterally. Left PCA supplied via the basilar. Fetal type origin of the right PCA supplied via a widely patent right posterior communicating artery. PCAs widely patent to their distal aspects without stenosis. Venous sinuses: Patent. Anatomic variants: Fetal type right PCA.  No intracranial aneurysm. Delayed phase: No abnormal enhancement. Evolving left posterior MCA cortical infarct noted (series 13, image 15). Review of the MIP images confirms the above findings CT Brain Perfusion Findings: CBF (<30%) Volume:  16mL Perfusion (Tmax>6.0s) volume: 15mL Mismatch Volume: -103mL Infarction Location:Small acute cortical infarct involving the posterior left MCA distribution at the posterior left frontal lobe. No surrounding perfusion deficit or penumbra. IMPRESSION: 1. Negative CTA for large vessel occlusion, with wide patency of the major arterial vasculature of the head and neck. 2. 3 mL core infarct at the posterior left frontotemporal region, posterior left MCA territory. No surrounding perfusion deficit or penumbra. 3. Mild atherosclerotic change for patient age without hemodynamically significant stenosis within the head and neck. 4. Severe emphysema. 5. **An incidental finding of potential clinical significance has been found. 6 mm spiculated nodule at the left lung apex, indeterminate. Non-contrast chest CT at 6-12 months is recommended. If the nodule is stable at time of repeat CT, then future CT at 18-24 months (from today's scan) is considered optional for low-risk patients, but is  recommended for high-risk patients. This recommendation follows the consensus statement: Guidelines for Management of Incidental Pulmonary Nodules Detected on CT Images: From the Fleischner Society 2017; Radiology 2017; 284:228-243.** Critical Value/emergent results were called by telephone at the time of interpretation on 08/13/2018 at 9:50 pm to Dr. Clearnce Hasten, who verbally acknowledged these results. Electronically Signed   By: Jeannine Boga M.D.   On: 08/13/2018 22:11        Assessment/Plan: 83 y.o. female with past medical history significant for paroxysmal atrial fibrillation on anticoagulation, hypertension, coronary artery disease presented to the hospital with acute onset of expressive aphasia the CT perfusion showed core infarct around the left MCA territory but no penumbra. Acute left MCA stroke with expressive aphasia most likely embolic in etiology     -MRI brain reviewed, confirmed L MCA stroke with petechial hemorrhage -Continue ASA/Lipitor for stroke prophylaxis -paroxysmal afib: pt needs to be started on oral anticoagulation for stroke prophylaxis, however, since MRI showed a moderate size stroke with petechial hemorrhage, I will hold starting oral anticoagulation for now, pt needs a follow up CT head in a week: if shows no hemorrhage then oral anticoagulation can be started, this can be done as an out patient follow up. -PT/OT and speech evaluation -Plan discussed in details with the patient at bedside     Arnaldo Natal, MD

## 2018-08-15 NOTE — Progress Notes (Signed)
[facility]  Address going to: [address], [place] [address] with daughter  If patient discharges after hours, please call [phone].   Veronica Wang 08/15/2018, 12:05 PM

## 2018-08-15 NOTE — Progress Notes (Signed)
Pharmacy Electrolyte Monitoring Consult:  Pharmacy consulted to assist in monitoring and replacing electrolytes in this 83 y.o. female admitted on 08/13/2018 with No chief complaint on file.   Labs:  Sodium (mmol/L)  Date Value  08/15/2018 140  06/12/2014 142   Potassium (mmol/L)  Date Value  08/15/2018 3.8  06/12/2014 4.1   Magnesium (mg/dL)  Date Value  08/14/2018 2.0   Phosphorus (mg/dL)  Date Value  12/28/2014 4.1   Calcium (mg/dL)  Date Value  08/15/2018 8.5 (L)   Calcium, Total (mg/dL)  Date Value  06/12/2014 9.1   Albumin (g/dL)  Date Value  08/13/2018 3.3 (L)  06/12/2014 3.6    Assessment: 83 yo female here with stroke. Per RN pt failed swallow eval x2. K 2.9 yesterday on admission, no supplementation.   Plan: K 3.8  No additional supplementation at this time. Will follow up potassium with am labs.  Chinita Greenland PharmD Clinical Pharmacist 08/15/2018

## 2018-08-15 NOTE — Discharge Summary (Signed)
[place] [facility] [place] NAME: Veronica Wang    MR#:  [id number]  DATE OF BIRTH:  April 22, 1936  DATE OF ADMISSION:  08/13/2018   ADMITTING PHYSICIAN: Lance Coon, MD  DATE OF DISCHARGE: 08/15/2018 12:49 PM  PRIMARY CARE PHYSICIAN: Adin Hector, MD   ADMISSION DIAGNOSIS:  Aphasia due to acute stroke ([place]) [I63.9, R47.01] DISCHARGE DIAGNOSIS:  Principal Problem:   Stroke [facility]) Active Problems:   COPD (chronic obstructive pulmonary disease) ([place])   Chronic combined systolic (congestive) and diastolic (congestive) heart failure (H[facility])   PAF (paroxysmal atrial fibrillation) ([facility])   HTN (hypertension)   HLD (hyperlipidemia)   Protein-calorie malnutrition, severe  SECONDARY DIAGNOSIS:   Past Medical History:  Diagnosis Date  . Abnormal MRI 09/10/2015  . Atrial fibrillation ([place])   . Cancer ([place])    Liver  . 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 Carcinoid tumor of intestine   . CHF (congestive heart failure) ([place])   . COPD (chronic obstructive pulmonary disease) ([facility])   . FH: chemotherapy   . GERD (gastroesophageal reflux disease)   . Hyperlipidemia   . Hypertension   . Macular degeneration   . Polycythemia   . Radiation    HOSPITAL COURSE:   Veronica Wang is an 83 year old female who presented to the ED with aphasia, confusion, and headache.  CTA head showed a left frontotemporal stroke.  She was admitted for further management.  Acute left MCA stroke with petechial hemorrhage- patient does have a history of paroxysmal atrial fibrillation, not on anticoagulation. Speech has improved. -Neurology consulted- recommend repeat CT head in 1 week.  If no hemorrhage, then recommend starting patient on oral anticoagulation. -Echo with EF 45 to 50% and left ventricle global hypokinesis -Continue aspirin and lipitor -LDL 71 -PT recommending home health PT -SLP recommending dysphagia 3 diet  Chronic combined systolic and diastolic congestive heart failure- no  signs of acute exacerbation -Echo this admission with EF 45 to 50% -Patient stated that she was no longer taking Lasix at home -Needs to follow-up with cardiology in the next 2 weeks  Chronic respiratory failure secondary to COPD- no signs of acute exacerbation.  Patient stable on 2 L O2.  Uses 2 to 3 L as needed at home. 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 No Known Allergies    DISCHARGE MEDICATIONS:   Allergies as of 08/15/2018      Reactions   No Known Allergies       Medication List    STOP taking these medications   furosemide 20 MG tablet Commonly known as:  LASIX   potassium chloride SA 20 MEQ tablet Commonly known as:  K-DUR,KLOR-CON   sucralfate 1 g tablet Commonly known as:  CARAFATE     TAKE these medications   acetaminophen 325 MG tablet Commonly known as:  TYLENOL Take 2 tablets (650 mg total) by  mouth every 6 (six) hours as needed for mild pain (or Fever >/= 101).   albuterol 108 (90 Base) MCG/ACT inhaler Commonly known as:  PROVENTIL HFA;VENTOLIN HFA Inhale 2 puffs into the lungs every 6 (six) hours as needed for wheezing or shortness of breath.   aspirin EC 81 MG tablet Take 81 mg by mouth daily.   atorvastatin 40 MG tablet Commonly known as:  LIPITOR Take 1  tablet (40 mg total) by mouth daily at 6 PM.   CENTRUM SILVER tablet Take 1 tablet by mouth daily.   famotidine 20 MG tablet Commonly known as:  PEPCID Take 20 mg by mouth daily.   feeding supplement Liqd Take 1 Container by mouth 3 (three) times daily between meals.   lidocaine 2 % solution Commonly known as:  XYLOCAINE Use as directed 15 mLs in the mouth or throat every 6 (six) hours as needed (chest/stomach pain).   lisinopril 5 MG tablet Commonly known as:  PRINIVIL,ZESTRIL Take 5 mg by mouth daily.   loperamide 2 MG tablet Commonly known as:  IMODIUM A-D Take 4 mg by mouth 4 (four) times daily as needed for diarrhea or loose stools.   ondansetron 8 MG tablet Commonly known as:  ZOFRAN Take 1 tablet (8 mg total) by mouth every 8 (eight) hours as needed for nausea or vomiting.   prochlorperazine 10 MG tablet Commonly known as:  COMPAZINE Take 1 tablet (10 mg total) by mouth every 6 (six) hours as needed for nausea or vomiting.   Telotristat Etiprate 250 MG Tabs Take 1 tablet by mouth 3 (three) times daily.   Vitamin B-12 CR 1000 MCG Tbcr Take 1 tablet by mouth daily.   VITAMIN D-1000 MAX ST 25 MCG (1000 UT) tablet Generic drug:  Cholecalciferol Take 1,000 Units by mouth daily.        DISCHARGE INSTRUCTIONS:  1.  Follow-up with PCP on Monday or Tuesday.  Needs CT head on Wednesday 2/12.  If no hemorrhage, needs to be started on oral anticoagulation. 2.  Follow-up with cardiology in 2 weeks 3.  Start Lipitor 40 mg daily DIET:  Cardiac diet DISCHARGE CONDITION:  Stable ACTIVITY:  Activity as tolerated OXYGEN:  Home Oxygen: Yes.    Oxygen Delivery: 2-3 liters/min via Patient connected to nasal cannula oxygen DISCHARGE LOCATION:  home   If you experience worsening of your admission symptoms, develop shortness of breath, life threatening emergency, suicidal or homicidal thoughts you must seek medical attention immediately by calling 911 or calling your MD  immediately  if symptoms less severe.  You Must read complete instructions/literature along with all the possible adverse reactions/side effects for all the Medicines you take and that have been prescribed to you. Take any new Medicines after you have completely understood and accpet all the possible adverse reactions/side effects.   Please note  You were cared for by a hospitalist during your hospital stay. If you have any questions about your discharge medications or the care you received while you were in the hospital after you are discharged, you can call the unit and asked to speak with the hospitalist on call if the hospitalist that took care of you is not available. Once you are discharged, your primary care physician will handle any further medical issues. Please note that NO REFILLS for any discharge medications will be authorized once you are discharged, as it is imperative that you return to your primary care physician (or establish a relationship with a primary care physician  if you do not have one) for your aftercare needs so that they can reassess your need for medications and monitor your lab values.    On the day of Discharge:  VITAL SIGNS:  Blood pressure 132/78, pulse 83, temperature 98.5 F (36.9 C), temperature source Oral, resp. rate 20, height 5\' 2"  (1.575 m), weight 29.3 kg, SpO2 96 %. PHYSICAL EXAMINATION:  General: Sitting up in bed, in no acute distress.  Thinappearing. HEENT: Normocephalic, atraumatic, EOMI, no scleral icterus, moist mucous membranes Neck:Normal range of motion.Neck supple.No JVDpresent. No thyromegalypresent.  Cardiovascular:RRR.  No murmurs, rubs, or gallops. Respiratory:+ Diminished breath sounds in the lung bases bilaterally, norespiratory distress. She hasno wheezes. She hasno rales. [place] in place. GI: soft, non-tender, non-distended Musculoskeletal: No pedal edema, cyanosis, clubbing Neurological: + Global weakness.  Sensation intact  light touch throughout. + Occasional word finding difficulty. Skin: Skin iswarmand dry.No rashnoted. No erythema.  Psychiatric: She has anormal mood and affect. Herbehavior is normal.Judgmentand thought contentnormal.  DATA REVIEW:   CBC Recent Labs  Lab 08/14/18 0415  WBC 10.0  HGB 12.8  HCT 39.3  PLT 262    Chemistries  Recent Labs  Lab 08/13/18 1813 08/14/18 0415  08/15/18 0508  NA 142  --   --  140  K 2.9* 2.9*   < > 3.8  CL 105  --   --  106  CO2 30  --   --  28  GLUCOSE 94  --   --  109*  BUN 14  --   --  16  CREATININE 0.67 0.57  --  0.51  CALCIUM 8.8*  --   --  8.5*  MG  --  2.0  --   --   AST 27  --   --   --   ALT 16  --   --   --   ALKPHOS 107  --   --   --   BILITOT 0.5  --   --   --    < > = values in this interval not displayed.     Microbiology Results  Results for orders placed or performed during the hospital encounter of 12/04/14  Culture, blood (routine x 2)     Status: None   Collection Time: 12/04/14  9:35 PM  Result Value Ref Range Status   Specimen Description BLOOD  Final   Special Requests Normal  Final   Culture NO GROWTH 5 DAYS  Final   Report Status 12/09/2014 FINAL  Final  Blood culture (routine x 2)     Status: None   Collection Time: 12/04/14  9:35 PM  Result Value Ref Range Status   Specimen Description BLOOD  Final   Special Requests Normal  Final   Culture NO GROWTH 5 DAYS  Final   Report Status 12/10/2014 FINAL  Final  Urine culture     Status: None   Collection Time: 12/04/14 10:30 PM  Result Value Ref Range Status   Specimen Description URINE, CLEAN CATCH  Final   Special Requests NONE  Final   Culture NO GROWTH 2 DAYS  Final   Report Status 12/06/2014 FINAL  Final  MRSA PCR Screening     Status: None   Collection Time: 12/05/14  9:34 AM  Result Value Ref Range Status   MRSA by PCR NEGATIVE NEGATIVE Final    Comment:        The GeneXpert MRSA Assay (FDA approved for NASAL specimens only), is one  component of a comprehensive MRSA colonization surveillance program. It is not intended to diagnose MRSA infection nor to guide or monitor treatment for MRSA infections.   C difficile quick scan w PCR reflex [facility])     Status: None   Collection Time: 12/06/14  4:02 PM  Result Value Ref Range Status   C Diff antigen NEGATIVE  Final   C Diff toxin NEGATIVE  Final   C Diff interpretation Negative for C. difficile  Final    RADIOLOGY:  Mr Brain Wo Contrast  Result Date: 08/14/2018 CLINICAL DATA:  83 y/o  F; episode of aphasia, confusion, headache. EXAM: MRI HEAD WITHOUT CONTRAST TECHNIQUE: Multiplanar, multiecho pulse sequences of the brain and surrounding structures were obtained without intravenous contrast. COMPARISON:  08/13/2018 CT head and CTA head. FINDINGS: Brain: Left posterior temporal lobe and lateral occipital lobe reduced diffusion measuring 6.4 x 2.4 x 1.9 cm (volume = 15 cm^3) compatible with late acute/early subacute infarction. Confluent petechial hemorrhage. No signal mass effect. No mass effect, extra-axial collection, hydrocephalus, herniation. Early confluent nonspecific T2 FLAIR hyperintensities in subcortical and periventricular white matter are compatible with moderate chronic microvascular ischemic changes. Moderate volume loss of the brain. Vascular: Normal flow voids. Skull and upper cervical spine: Normal marrow signal. Sinuses/Orbits: Negative. Other: Left intra-ocular lens replacement. IMPRESSION: 1. Left posterior temporal and lateral occipital lobe late acute/early subacute infarct, 15 cc. Acute hemorrhage, [place] calssification 1b: HI2, confluent petechiae, no mass effect. 2. Moderate chronic microvascular ischemic changes and volume loss of the brain. These results will be called to the ordering clinician or representative by the Radiologist Assistant, and communication documented in the PACS or zVision Dashboard. Electronically Signed   By: Kristine Garbe M.D.   On: 08/14/2018 20:33     Management plans discussed with the patient, family and they are in agreement.  CODE STATUS: Full Code   TOTAL TIME TAKING CARE OF THIS PATIENT: 35 minutes.    Berna Spare Kinlee Garrison M.D on 08/15/2018 at 2:20 PM  Between 7am to 6pm - Pager [phone]  After 6pm go to [email or web address] - password [email or web address]  [employer]  Hospitalists  Office  [phone]  CC: Primary care physician; Adin Hector, MD   Note: This dictation was prepared with Dragon dictation along with smaller phrase technology. Any transcriptional errors that result from this process are unintentional.

## 2018-08-15 NOTE — Discharge Instructions (Signed)
It was so nice to meet you during this hospitalization!  You came into the hospital with difficulty talking. We found that you had a stroke. We think the stroke was likely caused by you going into atrial fibrillation, which is an abnormal heart rhythm that causes blood clots to form in your heart. Your MRI showed a small amount of bleeding in the area of your stroke, so we cannot start a blood thinner right now.  The neurologist is recommending that you have a cat scan of your head on Wednesday 08/21/18. If it does not show any bleeding, he is recommending that you be started on a blood thinner. It is okay to take aspirin 81mg  daily in the meantime.  Take care, Dr. Brett Albino

## 2018-08-15 NOTE — Progress Notes (Signed)
Physical Therapy Treatment Patient Details Name: Veronica Wang MRN: [id number] DOB: [birth year] Today's Date: 08/15/2018    History of Present Illness Pt is an 83 year old female admitted for L side CVA following c/o of aphasia and unsteady gait.  PMH includes CA, macular degeneration, atrial fibrillation and Htn.    PT Comments    Pt on 2.5 lpm in room.  Placed on 2 lpm portable tank as no option for 2.5 and pt on 2 lpm at home intermittently. Bed mobility without assist.  Ambulated to bathroom with walker and min guard.  After bathroom use, O2 sats 87%.  Increased to 3 lpm and emphasis on breathing.  She increased to 90-91% and remained there during gait.  1 lap with RW and 1 lap with no device.  Pt generally steady with or without walker but gait is slower without.  She has no imbalances or buckling.  Remained in recliner after session and returned to 2.5 lpm.   Follow Up Recommendations  Home health PT;Supervision - Intermittent     Equipment Recommendations       Recommendations for Other Services       Precautions / Restrictions Precautions Precautions: Fall Restrictions Weight Bearing Restrictions: No    Mobility  Bed Mobility Overal bed mobility: Modified Independent                Transfers Overall transfer level: Modified independent Equipment used: None Transfers: Sit to/from Stand Sit to Stand: Supervision            Ambulation/Gait Ambulation/Gait assistance: Supervision Gait Distance (Feet): 320 Feet Assistive device: Rolling walker (2 wheeled);None Gait Pattern/deviations: Step-through pattern     General Gait Details: 1 lap with RW, 1 lap with no AD.  Generally steady with and without device although gait is slower without.  Pt reports feeling comfortable with either.   Stairs             Wheelchair Mobility    Modified Rankin (Stroke Patients Only)       Balance Overall balance assessment: Mild deficits observed, not  formally tested                                          Cognition Arousal/Alertness: Awake/alert Behavior During Therapy: WFL for tasks assessed/performed Overall Cognitive Status: Within Functional Limits for tasks assessed                                        Exercises Other Exercises Other Exercises: to commode to void and for medium BM    General Comments        Pertinent Vitals/Pain Pain Assessment: No/denies pain    Home Living                      Prior Function            PT Goals (current goals can now be found in the care plan section) Progress towards PT goals: Progressing toward goals    Frequency    7X/week      PT Plan Current plan remains appropriate    Co-evaluation              AM-PAC PT "6 Clicks" Mobility   Outcome Measure  Help needed turning  from your back to your side while in a flat bed without using bedrails?: None Help needed moving from lying on your back to sitting on the side of a flat bed without using bedrails?: None Help needed moving to and from a bed to a chair (including a wheelchair)?: A Little Help needed standing up from a chair using your arms (e.g., wheelchair or bedside chair)?: A Little Help needed to walk in hospital room?: A Little Help needed climbing 3-5 steps with a railing? : A Little 6 Click Score: 20    End of Session Equipment Utilized During Treatment: Gait belt;Oxygen Activity Tolerance: Patient tolerated treatment well Patient left: in chair;with call bell/phone within reach;with chair alarm set         Time: 5277-8242 PT Time Calculation (min) (ACUTE ONLY): 20 min  Charges:  $Gait Training: 8-22 mins                     Chesley Noon, PTA 08/15/18, 9:02 AM

## 2018-08-15 NOTE — Evaluation (Signed)
Speech Language Pathology Evaluation Patient Details Name: Veronica Wang MRN: [id number] DOB: [birth year] Today's Date: 08/15/2018 Time: 0900-1020 SLP Time Calculation (min) (ACUTE ONLY): 80 min  Problem List:  Patient Active Problem List   Diagnosis Date Noted  . Protein-calorie malnutrition, severe 08/14/2018  . Stroke ([place]) 08/13/2018  . HTN (hypertension) 08/13/2018  . HLD (hyperlipidemia) 08/13/2018  . Chronic combined systolic (congestive) and diastolic (congestive) heart failure ([place]) 11/27/2017  . PAF (paroxysmal atrial fibrillation) ([place]) 11/27/2017  . Abnormal MRI 09/10/2015  . Malnutrition of moderate degree ([place]) 12/06/2014  . Pneumonia 12/05/2014  . COPD (chronic obstructive pulmonary disease) ([place]) 12/05/2014  . 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 Abnormal MRI 09/10/2015  . Atrial fibrillation ([place])   . Cancer ([place])    Liver  . Cancer ([place])    stomach  . Carcinoid tumor of intestine   . CHF (congestive heart failure) ([place])   . COPD (chronic obstructive pulmonary disease) ([place])   . FH: chemotherapy   . GERD (gastroesophageal reflux disease)   . Hyperlipidemia   . Hypertension   . Macular degeneration   . Polycythemia   . Radiation    Past Surgical History:  Past Surgical History:  Procedure Laterality Date  . CATARACT EXTRACTION  splenectomy  . COLON RESECTION    . COLON SURGERY    . GASTRECTOMY    . SPLENECTOMY     HPI:  Per admitting H&P: pt is an 83 y.o. female with past medical history significant for hypertension, CHF, hyperlipidemia, paroxysmal atrial fibrillation not on anticoagulation, liver CA who presented to the hospital complaining about difficulty speaking. Patient came the emergency room accompanied by her family who noticed that the patient has been having difficulty expressing herself throughout the day, patient last seen normal at 1 PM but ever since she has been  having some difficulty finishing her sentences and expressing herself according to her daughter-in-law, patient also seems to be confused, however patient did not have any focal motor weakness, patient denied any loss of consciousness or fall, patient denied any visual changes or numbness, patient reported no chest pain or shortness of breath, patient did not have any nausea or vomiting. In the emergency room patient had a CT scan of the brain which shows no acute intracranial abnormalities, she had a CTA and CT perfusion which showed no large vessel occlusion however showed some evidence for left MCA territory infarction, patient was admitted to the hospital for stroke work-up. Pt has a history of paroxysmal atrial fibrillation, she is not on [facility] and was not clear on the reason.   Assessment / Plan / Recommendation Clinical Impression  ST informally screened pt and noted min word-finding and anomia throughout simple conversational tasks. Considering pt was demonstrating difficulty w/ conversational fluency, ST administered the Western Aphasia Battery (WAB) to assess pt's current cognitive-linguistic abilities, post stroke. Pt was admitted w/ left posterior temporal and lateral occipital lobe late acute infarct, per MRI. Damage to these areas can impact language in the areas of object naming, vision, reading/writing tasks, overall fluency, etc. Upon formal assessment using the bedside WAB, pt is presenting w/ a mild cognitive-linguistic deficit. Pt is exhibiting strengths in intelligiblity, answering questions, responding appropriately in conversation, automatic speech, pragmatic language (socially appropriate responses), and auditory verbal comprehension w/ yes/no questions (ex. "will paper burn in fire?"). However, pt presented w/ deficits in overall fluency of spontaneous speech (circumlocution, mild semantic  paraphasias, mild hesitations, and word-finding difficulty). Additionally, pt demonstrated weakness in  following sequential commands, object naming, and writing. Overall, pt's cognitive linguistic deficits appear to be aligned w/ receptive and expressive Aphasia.  During assessment of sequential commands such as "point to the coin with the pen," the pt appeared to exhibit max confusion and responded with "coin."  ST repeated directions, however the pt continued to exhibit confusion regarding the task itself. BSE completed yesterday; pt seems to be adequately tolerating Dysphagia III (MECHANICAL soft- for lacking dentition reason) w/ thins w/ no overt s/s of aspiration noted. No further skilled Dysphagia treatment indicated. In the area of object naming, pt correctly named 5/10 simple objects in her immediate environment (pen, shirt, spoon, hair, and shoulder). It is important to note pt exhibited semantic paraphasia in naming elbow as "knee." When asked again, pt confidently repeated "that's your knee!" Additionally, pt appeared to struggle finding and producing the word "shoe/sneaker" her production included the consonants "sn..k....sh.." but the pt was unable to produce either object name.  ST had pt read a small section of her dining menu aloud. Pt demonstrated 100% in decoding and comprehension of the reading task, however pt shared that she feels her reading is "blurry" since her stroke a couple days ago. Pt shared that she typically only wears reading glasses while doing crossword puzzles at home.  During assessment of writing tasks, pt demonstrated ease in writing her name w/ no hesitation. Pt had min difficulty remembering and writing her home address - did not include street number or state. Pt had mod-max difficulty w/ writing the sentence "the telephone is ringing." ST noted pt's outward frustration, as she would write letters and assess them saying "that's not right..." Pt's written output of this sentence was "Hhe Te Pe[place]aneRing" Lastly, ST assessed pt's description of the cookie jar picture. Pt's  description was fluent and appropriate w/ min word-finding problems for "stool" and "sink"; pt was able to correctly choose the correct word when given options (ex. "is that a bathtub or a sink?")  Overall, pt's cognitive-linguistic abilities are [facility], though she does exhibit difficulty in both expressive and receptive areas of language. ST discussed evaluation results w/ pt's daughter.  Recommend: Consider out-patient ST services vs. Home health, for mild receptive, expressive Aphasia to improve pt's word-finding, ability to follow sequential commands, and overall fluency of spoken and written language through strategic compensatory strategies to assist w/ ADLs.     SLP Assessment  SLP Recommendation/Assessment: Patient needs continued Speech Lanaguage Pathology Services SLP Visit Diagnosis: Aphasia (R47.01)    Follow Up Recommendations  Outpatient Rehab vs. Home Health   Frequency and Duration min 2x/week  2 weeks      SLP Evaluation Cognition  Overall Cognitive Status: Impaired/Different from baseline Arousal/Alertness: Awake/alert Orientation Level: Oriented X4 Attention: Focused;Sustained Focused Attention: Appears intact Sustained Attention: Appears intact Memory: Impaired Memory Impairment: Retrieval deficit(mild\) Awareness: Appears intact Problem Solving: (NT) Executive Function: Sequencing Sequencing: Impaired Sequencing Impairment: Verbal basic;Functional basic Behaviors: Other (comment)(some frustration w/ more difficult tasks) Safety/Judgment: Appears intact       Comprehension  Auditory Comprehension Overall Auditory Comprehension: Appears within functional limits for tasks assessed Yes/No Questions: Within Functional Limits Commands: Impaired(Sequential, receptive) Conversation: Simple Other Conversation Comments: Word finding and anomia in conversation Interfering Components: Working memory EffectiveTechniques: Extra processing time;Increased  volume;Repetition;Slowed speech;Visual/Gestural cues Visual Recognition/Discrimination Discrimination: Exceptions to WFL(object naming) Reading Comprehension Reading Status: Within funtional limits(slow and pt reported some blurriness)    Expression Expression  Primary Mode of Expression: Verbal Verbal Expression Overall Verbal Expression: Impaired Initiation: No impairment Automatic Speech: Name;Social Response Level of Generative/Spontaneous Verbalization: Conversation Repetition: (NT) Naming: Impairment Responsive: 76-100% accurate Confrontation: Impaired Convergent: 75-100% accurate Divergent: 75-100% accurate Verbal Errors: Semantic paraphasias;Not aware of errors Pragmatics: No impairment Effective Techniques: Semantic cues;Phonemic cues Written Expression Dominant Hand: Right Written Expression: Exceptions to [facility]   Oral / Motor  Oral Motor/Sensory Function Overall Oral Motor/Sensory Function: Within functional limits Motor Speech Overall Motor Speech: Appears within functional limits for tasks assessed Respiration: Within functional limits Phonation: Normal Resonance: Within functional limits Articulation: Within functional limitis Intelligibility: Intelligible Motor Planning: Witnin functional limits Motor Speech Errors: Not applicable   GO                    Emeline General, Graduate Student SLP 08/15/2018, 1:12 PM

## 2018-08-16 NOTE — Unmapped (Signed)
Hi Dr. Forbes Cellar,    Dr. Isa Rankin with [facility] has contacted the Communication Center requesting to speak with you directly regarding the following:    Patient went to clinic today for evaluation of swelling. Dr. Isa Rankin thinks the swelling may be a side effect of patient's medications.    Dr. Isa Rankin is available at [phone].    A page has also been sent.    Thank you,  Kelli Hope  [facility] Communication Center  [phone]

## 2018-08-19 ENCOUNTER — Other Ambulatory Visit: Payer: Self-pay | Admitting: Internal Medicine

## 2018-08-19 DIAGNOSIS — I619 Nontraumatic intracerebral hemorrhage, unspecified: Secondary | ICD-10-CM

## 2018-08-20 NOTE — Unmapped (Signed)
Hi,     Patient's daughter contacted the Communication Center requesting to speak with the care team of Ferdie Ping to discuss:    Calling to schedule patient's injection that was missed last week due to the patient being hospitalized. Patient is due for injection every 14 days and the last one received was on 08/01/2018. Requesting a call back from the care team to see if it will be possible for her mother to come in this week for injection and to f/u with Dr. Forbes Cellar. As of now the next available date is 08/29/2018.    Please contact Sherri at [phone].    Program: GI  Speciality: Medical Oncology    Check Indicates criteria has been reviewed and confirmed with the patient:    [x]  Preferred Name   [x]  DOB and/or MR#  [x]  Preferred Contact Method  [x]  Phone Number(s)   []  MyChart     Thank you,   Jannette Spanner  [facility] Communication Center   [phone]

## 2018-08-20 NOTE — Unmapped (Addendum)
Hi,     Patient's daughter [name] contacted the Communication Center requesting to speak with the care team of Allison Fisher to discuss:    Returning call to nurse triage. Patient's nurse appointment for injection is scheduled for 08/22/2018 in [place]. Labs and f/u with Dr. Forbes Cellar is scheduled for 08/29/2018.    Please contact Sherri at [phone].        Check Indicates criteria has been reviewed and confirmed with the patient:    [x]  Preferred Name   [x]  DOB and/or MR#  [x]  Preferred Contact Method  [x]  Phone Number(s)   []  MyChart     Thank you,   Jannette Spanner  [facility] Communication Center   [phone]

## 2018-08-20 NOTE — Unmapped (Signed)
Error

## 2018-08-21 ENCOUNTER — Encounter (INDEPENDENT_AMBULATORY_CARE_PROVIDER_SITE_OTHER): Payer: Self-pay

## 2018-08-21 ENCOUNTER — Ambulatory Visit
Admission: RE | Admit: 2018-08-21 | Discharge: 2018-08-21 | Disposition: A | Discharge status: Home / Self Care | Payer: Medicare Other | Source: Ambulatory Visit | Attending: Internal Medicine | Admitting: Internal Medicine

## 2018-08-21 DIAGNOSIS — I619 Nontraumatic intracerebral hemorrhage, unspecified: Secondary | ICD-10-CM | POA: Insufficient documentation

## 2018-08-22 ENCOUNTER — Institutional Professional Consult (permissible substitution): Admit: 2018-08-22 | Discharge: 2018-08-23 | Payer: MEDICARE

## 2018-08-22 DIAGNOSIS — C7B8 Other secondary neuroendocrine tumors: Secondary | ICD-10-CM

## 2018-08-22 DIAGNOSIS — C7A8 Other malignant neuroendocrine tumors: Principal | ICD-10-CM

## 2018-08-22 DIAGNOSIS — E340 Carcinoid syndrome: Secondary | ICD-10-CM

## 2018-08-29 ENCOUNTER — Ambulatory Visit
Admit: 2018-08-29 | Discharge: 2018-08-29 | Payer: MEDICARE | Attending: Hematology & Oncology | Primary: Hematology & Oncology

## 2018-08-29 ENCOUNTER — Ambulatory Visit: Admit: 2018-08-29 | Discharge: 2018-08-29 | Payer: MEDICARE

## 2018-08-29 DIAGNOSIS — C7A8 Other malignant neuroendocrine tumors: Principal | ICD-10-CM

## 2018-08-29 DIAGNOSIS — C7B8 Other secondary neuroendocrine tumors: Secondary | ICD-10-CM

## 2018-08-29 DIAGNOSIS — J438 Other emphysema: Secondary | ICD-10-CM

## 2018-08-29 DIAGNOSIS — I6340 Cerebral infarction due to embolism of unspecified cerebral artery: Secondary | ICD-10-CM

## 2018-08-29 DIAGNOSIS — E340 Carcinoid syndrome: Secondary | ICD-10-CM

## 2018-08-29 LAB — CBC W/ AUTO DIFF
BASOPHILS ABSOLUTE COUNT: 0.1 10*9/L (ref 0.0–0.1)
BASOPHILS RELATIVE PERCENT: 0.5 %
EOSINOPHILS ABSOLUTE COUNT: 0.1 10*9/L (ref 0.0–0.4)
EOSINOPHILS RELATIVE PERCENT: 1.5 %
HEMOGLOBIN: 13.9 g/dL (ref 13.5–16.0)
LARGE UNSTAINED CELLS: 2 % (ref 0–4)
LYMPHOCYTES ABSOLUTE COUNT: 1.9 10*9/L (ref 1.5–5.0)
LYMPHOCYTES RELATIVE PERCENT: 20.9 %
MEAN CORPUSCULAR HEMOGLOBIN CONC: 32.5 g/dL (ref 31.0–37.0)
MEAN CORPUSCULAR HEMOGLOBIN: 27.1 pg (ref 26.0–34.0)
MONOCYTES ABSOLUTE COUNT: 0.5 10*9/L (ref 0.2–0.8)
MONOCYTES RELATIVE PERCENT: 5.3 %
NEUTROPHILS ABSOLUTE COUNT: 6.4 10*9/L (ref 2.0–7.5)
NEUTROPHILS RELATIVE PERCENT: 69.8 %
PLATELET COUNT: 265 10*9/L (ref 150–440)
RED BLOOD CELL COUNT: 5.13 10*12/L (ref 4.00–5.20)
RED CELL DISTRIBUTION WIDTH: 16.2 % — ABNORMAL HIGH (ref 12.0–15.0)
WBC ADJUSTED: 9.1 10*9/L (ref 4.5–11.0)

## 2018-08-29 LAB — COMPREHENSIVE METABOLIC PANEL
ALBUMIN: 3.5 g/dL (ref 3.5–5.0)
ALKALINE PHOSPHATASE: 134 U/L — ABNORMAL HIGH (ref 38–126)
ALT (SGPT): 34 U/L (ref <35)
ANION GAP: 6 mmol/L — ABNORMAL LOW (ref 7–15)
AST (SGOT): 49 U/L — ABNORMAL HIGH (ref 14–38)
BILIRUBIN TOTAL: 0.4 mg/dL (ref 0.0–1.2)
BLOOD UREA NITROGEN: 17 mg/dL (ref 7–21)
BUN / CREAT RATIO: 24
CALCIUM: 9.1 mg/dL (ref 8.5–10.2)
CHLORIDE: 109 mmol/L — ABNORMAL HIGH (ref 98–107)
CO2: 30 mmol/L (ref 22.0–30.0)
CREATININE: 0.71 mg/dL (ref 0.60–1.00)
EGFR CKD-EPI AA FEMALE: >90 mL/min/{1.73_m2} (ref >=60)
EGFR CKD-EPI NON-AA FEMALE: 81 mL/min/{1.73_m2} (ref >=60)
GLUCOSE RANDOM: 116 mg/dL (ref 65–179)
PROTEIN TOTAL: 6.5 g/dL (ref 6.5–8.3)
SODIUM: 145 mmol/L (ref 135–145)

## 2018-08-29 LAB — NEUTROPHILS ABSOLUTE COUNT: Lab: 6.4

## 2018-08-29 LAB — ANION GAP: Anion gap 3:SCnc:Pt:Ser/Plas:Qn:: 6 — ABNORMAL LOW

## 2018-08-29 NOTE — Unmapped (Signed)
Summary from today:  1. Ok to start the blood thinner the Neurologist recommended  2. Continue the lanreotide every 14 days shots  3. Continue the telotristat for the diarrhea   4. No other changes in treatments    For health related questions call:    For appointments & questions Monday through Friday 8 AM??? 5 PM   please call [phone] or Toll free [phone].    On Nights, Weekends and Holidays  Call [phone] and ask for the oncologist on call.    Your GI Medical OncologyTreatment Team    Doctor: [place]. Forbes Cellar, MD  Nurse Practitioner: Eula Fried, ANP  Nurse Navigator: Ted Mcalpine, RN  Clinical Pharmacist: Konrad Penta, PharmD  Scheduling Administrator: Ms. Vickey Huger

## 2018-09-04 NOTE — Unmapped (Signed)
[facility] MEDICAL ONCOLOGY     PRIMARY CARE PROVIDER:  Curtis Sites, MD  [address] [facility] [place] [address]    CONSULTING PROVIDERS  Allison [facility], [facility], [place]  Allison Fisher, [facility]. 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 Slowly progressing  2. Improved diarrhea with combo of increased lanreotide and telotristat  3,. Weight stablized  4 Comorbid O2 dependent COPD, CHF, and recent CVA with residual  5. ECOG PS 2    RECOMMENDATIONS  1. Continue lanreotide and telotristat  2.  Defer any cancer directed treatment for now. Can consider PRRT if continues to improve clinically   3. RTC for check up in 2 months    DISCUSSION  Regarding NET, she is doing so much better with our current supportive care plan. I think with her recent CVA, we are probably best to continue to hold off any any treatment for her disease. Cont aggressive therapy for control of her syndrome with lanreotide q14 and telotristat    Regarding use of of DOAC for anticoagulation, there is no contracindication from the perspective of her NET.  NET is not a cancer that is terribly prothrombotic, however, the constellation of performance status decline and being housebound from diarrhea for so long may have contributed.     Re edema, I suspect this is multifactorial from her diastolic dysfuction, probable at least mild pulm HTN, abd adenopathy and recent malnutrition. Agree with plan for elevation and compression hose.   ______________________________________________________________________  HISTORY     Allison Fisher is seen today at the [facility] for consultation regarding met NET      Allison Fisher grew very confused and was taken to ER about a month ago where found to have a CVA. She is really please though because neurologist said it could be terrible. Feels blessed to still be able to function well. She is also please to report her diarrhea continues to be better controlled. Occasional day with 3-5 bm but most days just a couple in the am and then is fine. Eating better because she isnt afraid of consequence. No abd pain. Breathing is stable. Does have some mild LE edema and wonders cause and what she should do about it  Also wants to know if I am ok with her taking blood thinner.     MEDICAL HISTORY  Interval: stroke  Prior:   1. COPD, o2 dependent at night, wear some during the day if she feels sob.   2. Atrial fibrillation   3. CHF- diastolic  4. 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 Moving out of her house into an apt. Sister and BIL are moving into her home.   Prior: Much of her  family lives closeby. She used to be a smoker but quit she lives alone, though her daughter has encouraged her to move in,     REVIEW OF SYSTEMS  The remainder of a comprehensive 10 systems review is negative.    PHYSICAL EXAM  BP 152/68  - Pulse 84  - Temp 37.3 ??C (99.1 ??F) (Temporal)  - Resp 18  - Wt 32.4 kg (71 lb 6.4 oz)  - SpO2 93%  - BMI 13.93 kg/m??    GENERAL:  able to get on exam table prtty quickly  [facility]: full and appropriate range of affect with good insight and judgement  Neuro; occasional word finding difficulty, speech mostly fluid. Difficult with adding simple numbers.   HEENT: NCAT, pupils equal, sclerae anicteric, OP clear without erythema, exudate or ulceration; neck supple without thyromegaly or masses;   LYMPH: no cervical, supraclavicular, periumbilical adenopathy  PULM: normal resp rate, clear bilaterally without wheezes, rhonchi, rales, air movement a bit diminished   COR: regular without murmur, rub, gallop. distant  [facility]: abdomen soft, nontender, nondistended, no palpable hepatosplenomegaly   EXT: warm and well perfused, 1+ edema feet and around ankles bilaterally, symmetric  SKIN: No rashes    OBJECTIVE DATA  Labs look great. ald 3.5    RADIOLOGY RESULTS (scans are personally reviewed by me)  None today

## 2018-09-05 ENCOUNTER — Institutional Professional Consult (permissible substitution): Admit: 2018-09-05 | Discharge: 2018-09-06 | Payer: MEDICARE

## 2018-09-05 DIAGNOSIS — E340 Carcinoid syndrome: Secondary | ICD-10-CM

## 2018-09-05 DIAGNOSIS — C7B8 Other secondary neuroendocrine tumors: Secondary | ICD-10-CM

## 2018-09-05 DIAGNOSIS — C7A8 Other malignant neuroendocrine tumors: Principal | ICD-10-CM

## 2018-09-05 NOTE — Unmapped (Signed)
Pt received Lanreotide injection in right hip - pt tolerated well.    AVS received at scheduling.    Pt stable and ambulated independently from clinic.

## 2018-09-16 NOTE — Unmapped (Signed)
Called back and confirmed, yes every 14 days

## 2018-09-16 NOTE — Unmapped (Signed)
Hi,     Pincus Large contacted the [employer] requesting to speak with the care team of Allison Fisher to discuss:    Patient had Lanreotide injection on 09/05/18. Patient states she's due for a 2 week follow up for another injection. I didn't see anything in flow sheet or report from 2/27, is this correct?      Check Indicates criteria has been reviewed and confirmed with the patient:    []  Preferred Name   []  DOB and/or MR#  []  Preferred Contact Method  []  Phone Number(s)   []  MyChart     Thank you,   Kelli Hope  [place] [facility] Communication Center   [phone]

## 2018-09-19 ENCOUNTER — Institutional Professional Consult (permissible substitution): Admit: 2018-09-19 | Discharge: 2018-09-20 | Payer: MEDICARE

## 2018-09-19 DIAGNOSIS — C7A8 Other malignant neuroendocrine tumors: Principal | ICD-10-CM

## 2018-09-19 DIAGNOSIS — E340 Carcinoid syndrome: Principal | ICD-10-CM

## 2018-09-19 DIAGNOSIS — C7B8 Other secondary neuroendocrine tumors: Principal | ICD-10-CM

## 2018-09-19 NOTE — Unmapped (Signed)
Pt received Lanreotide injection in left gluteal - pt tolerated well.    AVS received at scheduling.    Pt stable and ambulated independently from clinic.

## 2018-10-02 NOTE — Unmapped (Signed)
Hi,     Sherri, daughter of the patient contacted the Communication Center requesting to speak with the care team of Ferdie Ping to discuss:    She would like to know if the patient's appointment in [facility] could be moved to a later time 11:30 or 12:30 on 10/03/18.    Please contact Sherri at [phone].    Check Indicates criteria has been reviewed and confirmed with the patient:    [x]  Preferred Name   [x]  DOB and/or MR#  [x]  Preferred Contact Method  [x]  Phone Number(s)   []  MyChart     Thank you,   Drema Balzarine  [facility] Communication Center   [phone]

## 2018-10-02 NOTE — Unmapped (Signed)
COVID-19 screening completed prior to scheduled clinic injection appointment tomorrow.

## 2018-10-02 NOTE — Unmapped (Signed)
\

## 2018-10-03 ENCOUNTER — Institutional Professional Consult (permissible substitution): Admit: 2018-10-03 | Discharge: 2018-10-04 | Payer: MEDICARE

## 2018-10-03 DIAGNOSIS — Z79899 Other long term (current) drug therapy: Principal | ICD-10-CM

## 2018-10-03 DIAGNOSIS — C7B8 Other secondary neuroendocrine tumors: Principal | ICD-10-CM

## 2018-10-03 DIAGNOSIS — E340 Carcinoid syndrome: Principal | ICD-10-CM

## 2018-10-03 DIAGNOSIS — C7A8 Other malignant neuroendocrine tumors: Principal | ICD-10-CM

## 2018-10-03 NOTE — Unmapped (Signed)
Pt received Lanreotide injection in right gluteal - pt tolerated well.    AVS received at scheduling.    Pt stable and ambulated independently from clinic.

## 2018-10-16 NOTE — Unmapped (Signed)
10/16/18    Travel Screening Questions Completed.    Travel Screening Questions/Answers:  1). Have you traveled within the last 14 days?: No  2). Do you have new or worsening respiratory symptoms (e.g. cough, difficulty breathing)?: No  3). Have you had close contact with a person with confirmed COVID-19 in the last 14 days before symptoms began?: No      Negative Travel Screen: Patient answered NO to questions 2 and 3. Proceed with current scheduling protocol for your clinic.       The call was handled in the following manner: Documented patient response and encounter closed

## 2018-10-17 ENCOUNTER — Institutional Professional Consult (permissible substitution): Admit: 2018-10-17 | Discharge: 2018-10-18 | Payer: MEDICARE

## 2018-10-17 DIAGNOSIS — C7B8 Other secondary neuroendocrine tumors: Secondary | ICD-10-CM

## 2018-10-17 DIAGNOSIS — E340 Carcinoid syndrome: Secondary | ICD-10-CM

## 2018-10-17 DIAGNOSIS — C7A8 Other malignant neuroendocrine tumors: Principal | ICD-10-CM

## 2018-10-17 NOTE — Unmapped (Signed)
Pt received Lanreotide injection in left gluteal - pt tolerated well.    AVS received at scheduling.    Pt stable and ambulated independently from clinic.

## 2018-10-26 ENCOUNTER — Emergency Department: Payer: Medicare Other

## 2018-10-26 ENCOUNTER — Encounter: Payer: Self-pay | Admitting: Emergency Medicine

## 2018-10-26 ENCOUNTER — Inpatient Hospital Stay
Admission: EM | Admit: 2018-10-26 | Discharge: 2018-10-30 | DRG: 291 | Disposition: A | Discharge status: Home Health | Payer: Medicare Other | Attending: Internal Medicine | Admitting: Internal Medicine

## 2018-10-26 ENCOUNTER — Other Ambulatory Visit: Payer: Self-pay

## 2018-10-26 DIAGNOSIS — Z20828 Contact with and (suspected) exposure to other viral communicable diseases: Secondary | ICD-10-CM | POA: Diagnosis present

## 2018-10-26 DIAGNOSIS — Z8701 Personal history of pneumonia (recurrent): Secondary | ICD-10-CM | POA: Diagnosis not present

## 2018-10-26 DIAGNOSIS — J449 Chronic obstructive pulmonary disease, unspecified: Secondary | ICD-10-CM | POA: Diagnosis present

## 2018-10-26 DIAGNOSIS — K529 Noninfective gastroenteritis and colitis, unspecified: Secondary | ICD-10-CM | POA: Diagnosis present

## 2018-10-26 DIAGNOSIS — Z9081 Acquired absence of spleen: Secondary | ICD-10-CM

## 2018-10-26 DIAGNOSIS — I739 Peripheral vascular disease, unspecified: Secondary | ICD-10-CM | POA: Diagnosis present

## 2018-10-26 DIAGNOSIS — K219 Gastro-esophageal reflux disease without esophagitis: Secondary | ICD-10-CM | POA: Diagnosis present

## 2018-10-26 DIAGNOSIS — Z79899 Other long term (current) drug therapy: Secondary | ICD-10-CM

## 2018-10-26 DIAGNOSIS — Z8673 Personal history of transient ischemic attack (TIA), and cerebral infarction without residual deficits: Secondary | ICD-10-CM

## 2018-10-26 DIAGNOSIS — Z923 Personal history of irradiation: Secondary | ICD-10-CM

## 2018-10-26 DIAGNOSIS — R0602 Shortness of breath: Secondary | ICD-10-CM | POA: Diagnosis present

## 2018-10-26 DIAGNOSIS — J9601 Acute respiratory failure with hypoxia: Secondary | ICD-10-CM | POA: Diagnosis present

## 2018-10-26 DIAGNOSIS — Z8249 Family history of ischemic heart disease and other diseases of the circulatory system: Secondary | ICD-10-CM | POA: Diagnosis not present

## 2018-10-26 DIAGNOSIS — E785 Hyperlipidemia, unspecified: Secondary | ICD-10-CM | POA: Diagnosis present

## 2018-10-26 DIAGNOSIS — Z8505 Personal history of malignant neoplasm of liver: Secondary | ICD-10-CM | POA: Diagnosis not present

## 2018-10-26 DIAGNOSIS — H3530 Unspecified macular degeneration: Secondary | ICD-10-CM | POA: Diagnosis present

## 2018-10-26 DIAGNOSIS — I5023 Acute on chronic systolic (congestive) heart failure: Secondary | ICD-10-CM | POA: Diagnosis present

## 2018-10-26 DIAGNOSIS — R011 Cardiac murmur, unspecified: Secondary | ICD-10-CM | POA: Diagnosis present

## 2018-10-26 DIAGNOSIS — Z79890 Hormone replacement therapy (postmenopausal): Secondary | ICD-10-CM

## 2018-10-26 DIAGNOSIS — Z85028 Personal history of other malignant neoplasm of stomach: Secondary | ICD-10-CM | POA: Diagnosis not present

## 2018-10-26 DIAGNOSIS — G4700 Insomnia, unspecified: Secondary | ICD-10-CM | POA: Diagnosis present

## 2018-10-26 DIAGNOSIS — I480 Paroxysmal atrial fibrillation: Secondary | ICD-10-CM | POA: Diagnosis present

## 2018-10-26 DIAGNOSIS — E876 Hypokalemia: Secondary | ICD-10-CM | POA: Diagnosis present

## 2018-10-26 DIAGNOSIS — I5020 Unspecified systolic (congestive) heart failure: Secondary | ICD-10-CM

## 2018-10-26 DIAGNOSIS — I110 Hypertensive heart disease with heart failure: Principal | ICD-10-CM | POA: Diagnosis present

## 2018-10-26 DIAGNOSIS — I509 Heart failure, unspecified: Secondary | ICD-10-CM

## 2018-10-26 DIAGNOSIS — Z87891 Personal history of nicotine dependence: Secondary | ICD-10-CM

## 2018-10-26 DIAGNOSIS — Z66 Do not resuscitate: Secondary | ICD-10-CM | POA: Diagnosis present

## 2018-10-26 DIAGNOSIS — Z7901 Long term (current) use of anticoagulants: Secondary | ICD-10-CM

## 2018-10-26 LAB — COMPREHENSIVE METABOLIC PANEL
ALT: 30 U/L (ref 0–44)
AST: 36 U/L (ref 15–41)
Albumin: 3.4 g/dL — ABNORMAL LOW (ref 3.5–5.0)
Alkaline Phosphatase: 141 U/L — ABNORMAL HIGH (ref 38–126)
Anion gap: 8 (ref 5–15)
BUN: 16 mg/dL (ref 8–23)
CO2: 31 mmol/L (ref 22–32)
Calcium: 8.4 mg/dL — ABNORMAL LOW (ref 8.9–10.3)
Chloride: 102 mmol/L (ref 98–111)
Creatinine, Ser: 0.52 mg/dL (ref 0.44–1.00)
GFR calc Af Amer: >60 mL/min (ref >60)
GFR calc non Af Amer: >60 mL/min (ref >60)
Glucose, Bld: 110 mg/dL — ABNORMAL HIGH (ref 70–99)
Potassium: 3.4 mmol/L — ABNORMAL LOW (ref 3.5–5.1)
Sodium: 141 mmol/L (ref 135–145)
Total Bilirubin: 0.4 mg/dL (ref 0.3–1.2)
Total Protein: 6.2 g/dL — ABNORMAL LOW (ref 6.5–8.1)

## 2018-10-26 LAB — TROPONIN I: Troponin I: 0.03 ng/mL (ref <0.03)

## 2018-10-26 LAB — CBC
HCT: 39.6 % (ref 36.0–46.0)
Hemoglobin: 12.6 g/dL (ref 12.0–15.0)
MCH: 26.5 pg (ref 26.0–34.0)
MCHC: 31.8 g/dL (ref 30.0–36.0)
MCV: 83.4 fL (ref 80.0–100.0)
Platelets: 247 10*3/uL (ref 150–400)
RBC: 4.75 MIL/uL (ref 3.87–5.11)
RDW: 16 % — ABNORMAL HIGH (ref 11.5–15.5)
WBC: 9 10*3/uL (ref 4.0–10.5)
nRBC: 0 % (ref 0.0–0.2)

## 2018-10-26 LAB — SARS CORONAVIRUS 2 BY RT PCR (HOSPITAL ORDER, PERFORMED IN ~~LOC~~ HOSPITAL LAB): SARS Coronavirus 2: NEGATIVE

## 2018-10-26 LAB — BRAIN NATRIURETIC PEPTIDE: B Natriuretic Peptide: 1693 pg/mL — ABNORMAL HIGH (ref 0.0–100.0)

## 2018-10-26 MED ORDER — SODIUM CHLORIDE 0.9% FLUSH
3.0000 mL | Freq: Two times a day (BID) | INTRAVENOUS | Status: DC
  Administered 2018-10-26 – 2018-10-30 (×8): 3 mL via INTRAVENOUS

## 2018-10-26 MED ORDER — FUROSEMIDE 40 MG PO TABS
40.0000 mg | ORAL_TABLET | Freq: Once | ORAL | Status: AC
  Administered 2018-10-26: 40 mg via ORAL
  Filled 2018-10-26 (×2): qty 1

## 2018-10-26 MED ORDER — FUROSEMIDE 10 MG/ML IJ SOLN
60.0000 mg | Freq: Two times a day (BID) | INTRAMUSCULAR | Status: DC
  Administered 2018-10-27: 60 mg via INTRAVENOUS
  Filled 2018-10-26: qty 6

## 2018-10-26 MED ORDER — ONDANSETRON HCL 4 MG/2ML IJ SOLN: 4.0000 mg | Freq: Four times a day (QID) | INTRAMUSCULAR | Status: DC | PRN

## 2018-10-26 MED ORDER — ATORVASTATIN CALCIUM 20 MG PO TABS
40.0000 mg | ORAL_TABLET | Freq: Every day | ORAL | Status: DC
  Administered 2018-10-26 – 2018-10-29 (×4): 40 mg via ORAL
  Filled 2018-10-26 (×4): qty 2

## 2018-10-26 MED ORDER — TELOTRISTAT ETHYL(AS ETIPRATE) 250 MG PO TABS: 1.0000 | ORAL_TABLET | Freq: Three times a day (TID) | ORAL | Status: DC

## 2018-10-26 MED ORDER — POTASSIUM CHLORIDE CRYS ER 10 MEQ PO TBCR
10.0000 meq | EXTENDED_RELEASE_TABLET | Freq: Every day | ORAL | Status: DC | PRN
  Administered 2018-10-26 – 2018-10-27 (×2): 10 meq via ORAL
  Filled 2018-10-26 (×2): qty 1

## 2018-10-26 MED ORDER — LISINOPRIL 5 MG PO TABS
5.0000 mg | ORAL_TABLET | Freq: Every day | ORAL | Status: DC
  Administered 2018-10-27 – 2018-10-29 (×3): 5 mg via ORAL
  Filled 2018-10-26 (×4): qty 1

## 2018-10-26 MED ORDER — ADULT MULTIVITAMIN W/MINERALS CH
1.0000 | ORAL_TABLET | Freq: Every day | ORAL | Status: DC
  Administered 2018-10-27 – 2018-10-30 (×4): 1 via ORAL
  Filled 2018-10-26 (×4): qty 1

## 2018-10-26 MED ORDER — METHYLPREDNISOLONE SODIUM SUCC 125 MG IJ SOLR
125.0000 mg | Freq: Once | INTRAMUSCULAR | Status: AC
  Administered 2018-10-26: 125 mg via INTRAVENOUS
  Filled 2018-10-26 (×2): qty 2

## 2018-10-26 MED ORDER — FUROSEMIDE 20 MG PO TABS: 20.0000 mg | ORAL_TABLET | Freq: Once | ORAL | Status: DC

## 2018-10-26 MED ORDER — VITAMIN B-12 1000 MCG PO TABS
1000.0000 ug | ORAL_TABLET | Freq: Every day | ORAL | Status: DC
  Administered 2018-10-27 – 2018-10-30 (×4): 1000 ug via ORAL
  Filled 2018-10-26 (×4): qty 1

## 2018-10-26 MED ORDER — SODIUM CHLORIDE 0.9% FLUSH: 3.0000 mL | INTRAVENOUS | Status: DC | PRN

## 2018-10-26 MED ORDER — SODIUM CHLORIDE 0.9 % IV SOLN: 250.0000 mL | INTRAVENOUS | Status: DC | PRN

## 2018-10-26 MED ORDER — ALBUTEROL SULFATE (2.5 MG/3ML) 0.083% IN NEBU: 2.5000 mg | INHALATION_SOLUTION | Freq: Four times a day (QID) | RESPIRATORY_TRACT | Status: DC | PRN

## 2018-10-26 MED ORDER — BOOST HIGH PROTEIN PO LIQD
1.0000 | Freq: Three times a day (TID) | ORAL | Status: DC
  Administered 2018-10-26 – 2018-10-28 (×4): 237 mL via ORAL
  Filled 2018-10-26: qty 237

## 2018-10-26 MED ORDER — IPRATROPIUM-ALBUTEROL 0.5-2.5 (3) MG/3ML IN SOLN
3.0000 mL | Freq: Once | RESPIRATORY_TRACT | Status: AC
  Administered 2018-10-26: 3 mL via RESPIRATORY_TRACT
  Filled 2018-10-26: qty 3

## 2018-10-26 MED ORDER — ORAL CARE MOUTH RINSE
15.0000 mL | Freq: Two times a day (BID) | OROMUCOSAL | Status: DC
  Administered 2018-10-28 – 2018-10-29 (×3): 15 mL via OROMUCOSAL

## 2018-10-26 MED ORDER — FAMOTIDINE 20 MG PO TABS: 20.0000 mg | ORAL_TABLET | Freq: Every day | ORAL | Status: DC | PRN

## 2018-10-26 MED ORDER — TELOTRISTAT ETHYL(AS ETIPRATE) 250 MG PO TABS
1.0000 | ORAL_TABLET | Freq: Three times a day (TID) | ORAL | Status: DC
  Administered 2018-10-26 – 2018-10-30 (×9): 1 via ORAL
  Filled 2018-10-26 (×8): qty 3

## 2018-10-26 MED ORDER — VITAMIN D3 25 MCG (1000 UNIT) PO TABS
1000.0000 [IU] | ORAL_TABLET | Freq: Every day | ORAL | Status: DC
  Administered 2018-10-27 – 2018-10-30 (×4): 1000 [IU] via ORAL
  Filled 2018-10-26 (×4): qty 1

## 2018-10-26 MED ORDER — DABIGATRAN ETEXILATE MESYLATE 75 MG PO CAPS
75.0000 mg | ORAL_CAPSULE | Freq: Two times a day (BID) | ORAL | Status: DC
  Administered 2018-10-26 – 2018-10-30 (×8): 75 mg via ORAL
  Filled 2018-10-26 (×9): qty 1

## 2018-10-26 MED ORDER — ACETAMINOPHEN 325 MG PO TABS: 650.0000 mg | ORAL_TABLET | ORAL | Status: DC | PRN

## 2018-10-26 NOTE — ED Notes (Signed)
ED TO INPATIENT HANDOFF REPORT  ED Nurse Name and Phone #: Clent Ridges Name/Age/Gender Veronica Wang 83 y.o. female Room/Bed: ED31A/ED31A  Code Status   Code Status: Prior  Home/SNF/Other Home Patient oriented to: self, place, time and situation Is this baseline? Yes   Triage Complete: Triage complete  Chief Complaint winded tight chest  Triage Note Pt has chronic SHOB but worse over last 3 days compared to normal. Pt reports feels like when has PNA. Pt reports little cough. No known fever.  Mildly labored.  Daughter informed RN pt has been smoking and she is not supposed to.    Allergies Allergies  Allergen Reactions  . No Known Allergies     Level of Care/Admitting Diagnosis ED Disposition    ED Disposition Condition [facility] Area: [place] [[id number]]  Level of Care: Telemetry [5]  Covid Evaluation: N/A  Diagnosis: Acute exacerbation of CHF (congestive heart failure) [facility]) [[id number]]  Admitting Physician: Saundra Shelling [[id number]]  Attending Physician: Saundra Shelling [[id number]]  Estimated length of stay: past midnight tomorrow  Certification:: I certify this patient will need inpatient services for at least 2 midnights  PT Class (Do Not Modify): Inpatient [101]  PT Acc Code (Do Not Modify): Private [1]       B Medical/Surgery History Past Medical History:  Diagnosis Date  . Abnormal MRI 09/10/2015  . Atrial fibrillation ([place])   . Cancer ([place])    Liver  . Cancer ([place])    stomach  . Carcinoid tumor of intestine   . CHF (congestive heart failure) ([place])   . COPD (chronic obstructive pulmonary disease) ([place])   . FH: chemotherapy   . GERD (gastroesophageal reflux disease)   . Hyperlipidemia   . Hypertension   . Macular degeneration   . Polycythemia   . Radiation    Past Surgical History:  Procedure Laterality Date  . CATARACT EXTRACTION  splenectomy  . COLON RESECTION    . COLON SURGERY    . GASTRECTOMY    .  SPLENECTOMY       A IV Location/Drains/Wounds Patient Lines/Drains/Airways Status   Active Line/Drains/Airways    Name:   Placement date:   Placement time:   Site:   Days:   Peripheral IV Left;Lateral Forearm   -    -    Forearm             Intake/Output Last 24 hours No intake or output data in the 24 hours ending 10/26/18 1816  Labs/Imaging Results for orders placed or performed during the hospital encounter of 10/26/18 (from the past 48 hour(s))  CBC     Status: Abnormal   Collection Time: 10/26/18  3:25 PM  Result Value Ref Range   WBC 9.0 4.0 - 10.5 K/uL   RBC 4.75 3.87 - 5.11 MIL/uL   Hemoglobin 12.6 12.0 - 15.0 g/dL   HCT 39.6 36.0 - 46.0 %   MCV 83.4 80.0 - 100.0 fL   MCH 26.5 26.0 - 34.0 pg   MCHC 31.8 30.0 - 36.0 g/dL   RDW 16.0 (H) 11.5 - 15.5 %   Platelets 247 150 - 400 K/uL   nRBC 0.0 0.0 - 0.2 %    Comment: Performed at [facility], [address] [place], [place] [address]  Troponin I - Once     Status: Abnormal   Collection Time: 10/26/18  3:25 PM  Result Value Ref Range   Troponin I 0.03 (  HH) <0.03 ng/mL    Comment: CRITICAL RESULT CALLED TO, READ BACK BY AND VERIFIED WITH RN BRANDY @1610  10/26/18 AKT Performed at [facility], [address] [place], [place] [address]   Comprehensive metabolic panel     Status: Abnormal   Collection Time: 10/26/18  3:25 PM  Result Value Ref Range   Sodium 141 135 - 145 mmol/L   Potassium 3.4 (L) 3.5 - 5.1 mmol/L   Chloride 102 98 - 111 mmol/L   CO2 31 22 - 32 mmol/L   Glucose, Bld 110 (H) 70 - 99 mg/dL   BUN 16 8 - 23 mg/dL   Creatinine, Ser 0.52 0.44 - 1.00 mg/dL   Calcium 8.4 (L) 8.9 - 10.3 mg/dL   Total Protein 6.2 (L) 6.5 - 8.1 g/dL   Albumin 3.4 (L) 3.5 - 5.0 g/dL   AST 36 15 - 41 U/L   ALT 30 0 - 44 U/L   Alkaline Phosphatase 141 (H) 38 - 126 U/L   Total Bilirubin 0.4 0.3 - 1.2 mg/dL   GFR calc non Af Amer >60 >60 mL/min   GFR calc Af Amer >60 >60 mL/min   Anion gap 8 5 - 15     Comment: Performed at [facility], [address], [place], [place] [address]  Brain natriuretic peptide     Status: Abnormal   Collection Time: 10/26/18  3:25 PM  Result Value Ref Range   B Natriuretic Peptide 1,693.0 (H) 0.0 - 100.0 pg/mL    Comment: Performed at [facility], [address] [place], [place] [address]  SARS Coronavirus 2 [facility] order, Performed in [place] hospital lab)     Status: None   Collection Time: 10/26/18  3:25 PM  Result Value Ref Range   SARS Coronavirus 2 NEGATIVE NEGATIVE    Comment: (NOTE) If result is NEGATIVE SARS-CoV-2 target nucleic acids are NOT DETECTED. The SARS-CoV-2 RNA is generally detectable in upper and lower  respiratory specimens during the acute phase of infection. The lowest  concentration of SARS-CoV-2 viral copies this assay can detect is 250  copies / mL. A negative result does not preclude SARS-CoV-2 infection  and should not be used as the sole basis for treatment or other  patient management decisions.  A negative result may occur with  improper specimen collection / handling, submission of specimen other  than nasopharyngeal swab, presence of viral mutation(s) within the  areas targeted by this assay, and inadequate number of viral copies  (<250 copies / mL). A negative result must be combined with clinical  observations, patient history, and epidemiological information. If result is POSITIVE SARS-CoV-2 target nucleic acids are DETECTED. The SARS-CoV-2 RNA is generally detectable in upper and lower  respiratory specimens dur ing the acute phase of infection.  Positive  results are indicative of active infection with SARS-CoV-2.  Clinical  correlation with patient history and other diagnostic information is  necessary to determine patient infection status.  Positive results do  not rule out bacterial infection or co-infection with other viruses. If result is PRESUMPTIVE POSTIVE SARS-CoV-2 nucleic  acids MAY BE PRESENT.   A presumptive positive result was obtained on the submitted specimen  and confirmed on repeat testing.  While 2019 novel coronavirus  (SARS-CoV-2) nucleic acids may be present in the submitted sample  additional confirmatory testing may be necessary for epidemiological  and / or clinical management purposes  to differentiate between  SARS-CoV-2 and other Sarbecovirus currently known to  infect humans.  If clinically indicated additional testing with an alternate test  methodology [phone]) is advised. The SARS-CoV-2 RNA is generally  detectable in upper and lower respiratory sp ecimens during the acute  phase of infection. The expected result is Negative. Fact Sheet for Patients:  StrictlyIdeas.no Fact Sheet for Healthcare Providers: BankingDealers.co.za This test is not yet approved or cleared by the [place] FDA and has been authorized for detection and/or diagnosis of SARS-CoV-2 by FDA under an Emergency Use Authorization (EUA).  This EUA will remain in effect (meaning this test can be used) for the duration of the COVID-19 declaration under Section 564(b)(1) of the Act, 21 U.S.C. section 360bbb-3(b)(1), unless the authorization is terminated or revoked sooner. Performed at [facility], [address], [place], [place] [address]    Dg Chest Port 1 View  Result Date: 10/26/2018 CLINICAL DATA:  Smoker and chronic shortness of breath. EXAM: PORTABLE CHEST 1 VIEW COMPARISON:  07/18/2018 and prior radiograph FINDINGS: Cardiomegaly noted. New bilateral interstitial opacities are noted suspicious for interstitial edema. Small bilateral pleural effusions, LEFT-greater-than-RIGHT, noted. LEFT LOWER lung consolidation/atelectasis identified. No pneumothorax or acute bony abnormality. IMPRESSION: 1. New bilateral interstitial opacities likely representing pulmonary edema. 2. New small bilateral pleural  effusions, LEFT-greater-than-RIGHT, with LEFT LOWER lung consolidation/atelectasis. Electronically Signed   By: Margarette Canada M.D.   On: 10/26/2018 15:53    Pending Labs [employer] (From admission, onward)    Start     Ordered   Signed and Held  Basic metabolic panel  Daily,   R     Signed and Held          Vitals/Pain Today's Vitals   10/26/18 1501 10/26/18 1638 10/26/18 1705  BP: (!) 141/88 136/86 (!) 147/92  Pulse: 85 91 87  Resp: (!) 24 18 (!) 21  Temp: (!) 97.4 F (36.3 C)    TempSrc: Oral    SpO2: 97% 99% 98%  Weight: 32.7 kg    Height: 5\' 2"  (1.575 m)    PainSc: 0-No pain 0-No pain     Isolation Precautions No active isolations  Medications Medications  ipratropium-albuterol (DUONEB) 0.5-2.5 (3) MG/3ML nebulizer solution 3 mL (3 mLs Nebulization Given 10/26/18 1636)  furosemide (LASIX) tablet 40 mg (40 mg Oral Given 10/26/18 1636)  methylPREDNISolone sodium succinate (SOLU-MEDROL) 125 mg/2 mL injection 125 mg (125 mg Intravenous Given 10/26/18 1654)    Mobility walks with person assist Low fall risk   Focused Assessments Pulmonary Assessment Handoff:  Lung sounds:   O2 Device: Nasal Cannula O2 Flow Rate (L/min): 3 L/min      R Recommendations: See Admitting Provider Note  Report given to:   Additional Notes:

## 2018-10-26 NOTE — ED Triage Notes (Signed)
Pt has chronic SHOB but worse over last 3 days compared to normal. Pt reports feels like when has PNA. Pt reports little cough. No known fever.  Mildly labored.  Daughter informed RN pt has been smoking and she is not supposed to.

## 2018-10-26 NOTE — ED Notes (Signed)
Attempted to call report at [facility]

## 2018-10-26 NOTE — Progress Notes (Signed)
Patient's daughter reports that she takes a medication she gets at [facility]- last time she was admitted the daughter had to bring her medication from home. RN will tell floor to call daughter to arrange this tomorrow.

## 2018-10-26 NOTE — H&P (Signed)
[place] [facility] [place] NAME: Jessicia Napolitano    MR#:  [id number]  DATE OF BIRTH:  11/28/1935  DATE OF ADMISSION:  10/26/2018  PRIMARY CARE PHYSICIAN: Adin Hector, MD   REQUESTING/REFERRING PHYSICIAN:   CHIEF COMPLAINT:   Chief Complaint  Patient presents with  . Shortness of Breath    HISTORY OF PRESENT ILLNESS: Cambrea Kirt  is a 83 y.o. female with a known history of atrial fibrillation, hepatocellular carcinoma, chronic congestive heart failure, COPD, hyperlipidemia, hypertension presented to the emergency room for shortness of breath.  Patient has orthopnea.  No complaints of cough, fever and chills.  She was tested for coronavirus and is negative.  BNP is elevated and chest x-ray shows pulmonary edema and fluid overload.  Patient was given IV Lasix for diuresis.  Hospital service was consulted.  PAST MEDICAL HISTORY:   Past Medical History:  Diagnosis Date  . Abnormal MRI 09/10/2015  . Atrial fibrillation ([place])   . Cancer ([place])    Liver  . Cancer ([place])    stomach  . Carcinoid tumor of intestine   . CHF (congestive heart failure) ([place])   . COPD (chronic obstructive pulmonary disease) ([place])   . FH: chemotherapy   . GERD (gastroesophageal reflux disease)   . Hyperlipidemia   . Hypertension   . Macular degeneration   . Polycythemia   . Radiation     PAST SURGICAL HISTORY:  Past Surgical History:  Procedure Laterality Date  . CATARACT EXTRACTION  splenectomy  . COLON RESECTION    . COLON SURGERY    . GASTRECTOMY    . SPLENECTOMY      SOCIAL HISTORY:  Social History   Tobacco Use  . Smoking status: Former Smoker    Types: Cigarettes  . Smokeless tobacco: Never Used  Substance Use Topics  . Alcohol use: No    Alcohol/week: 0.0 standard drinks    FAMILY HISTORY:  Family History  Problem Relation Age of Onset  . Heart attack Sister   . Heart attack Brother   . Leukemia Mother     DRUG ALLERGIES:  Allergies   Allergen Reactions  . No Known Allergies     REVIEW OF SYSTEMS:   CONSTITUTIONAL: No fever, fatigue or weakness.  EYES: No blurred or double vision.  EARS, NOSE, AND THROAT: No tinnitus or ear pain.  RESPIRATORY: No cough, has shortness of breath,  No wheezing or hemoptysis.  CARDIOVASCULAR: No chest pain,  Has orthopnea, edema.  GASTROINTESTINAL: No nausea, vomiting, diarrhea or abdominal pain.  GENITOURINARY: No dysuria, hematuria.  ENDOCRINE: No polyuria, nocturia,  HEMATOLOGY: No anemia, easy bruising or bleeding SKIN: No rash or lesion. MUSCULOSKELETAL: No joint pain or arthritis.   NEUROLOGIC: No tingling, numbness, weakness.  PSYCHIATRY: No anxiety or depression.   MEDICATIONS AT HOME:  Prior to Admission medications   Medication Sig Start Date End Date Taking? Authorizing Provider  atorvastatin (LIPITOR) 40 MG tablet Take 1 tablet (40 mg total) by mouth daily at 6 PM. 08/15/18  Yes Mayo, Pete Pelt, MD  Cholecalciferol (VITAMIN D-1000 MAX ST) 1000 UNITS tablet Take 1,000 Units by mouth daily.    Yes [provider]  Cyanocobalamin (VITAMIN B-12 CR) 1000 MCG TBCR Take 1 tablet by mouth daily.  03/02/11  Yes [provider]  dabigatran (PRADAXA) 75 MG CAPS capsule Take 75 mg by mouth 2 (two) times daily.   Yes [provider]  LANREOTIDE ACETATE Luis Llorens Torres  Inject into the skin.   Yes [provider]  lisinopril (PRINIVIL,ZESTRIL) 5 MG tablet Take 5 mg by mouth daily. 06/18/18  Yes [provider]  Multiple Vitamins-Minerals (CENTRUM SILVER) tablet Take 1 tablet by mouth daily.  03/02/11  Yes [provider]  octreotide (SANDOSTATIN) 100 MCG/ML SOLN injection Inject 100 mcg into the skin every 30 (thirty) days. 05/11/18  Yes [provider]  Telotristat Etiprate 250 MG TABS Take 1 tablet by mouth 3 (three) times daily. 05/27/18  Yes [provider]  acetaminophen (TYLENOL) 325 MG tablet Take 2 tablets (650 mg total) by  mouth every 6 (six) hours as needed for mild pain (or Fever >/= 101). 12/15/14   Tama High III, MD  albuterol (PROVENTIL HFA;VENTOLIN HFA) 108 (90 BASE) MCG/ACT inhaler Inhale 2 puffs into the lungs every 6 (six) hours as needed for wheezing or shortness of breath.    [provider]  ELIQUIS 2.5 MG TABS tablet Take 2.5 mg by mouth every 12 (twelve) hours. 10/04/18   [provider]  famotidine (PEPCID) 20 MG tablet Take 20 mg by mouth daily.     [provider]  feeding supplement (BOOST HIGH PROTEIN) LIQD Take 1 Container by mouth 3 (three) times daily between meals.    [provider]  furosemide (LASIX) 20 MG tablet Take 20 mg by mouth daily. 09/02/18   [provider]  lidocaine (XYLOCAINE) 2 % solution Use as directed 15 mLs in the mouth or throat every 6 (six) hours as needed (chest/stomach pain). Patient not taking: Reported on 08/13/2018 07/18/18   Nance Pear, MD  loperamide (IMODIUM A-D) 2 MG tablet Take 4 mg by mouth 4 (four) times daily as needed for diarrhea or loose stools.    [provider]  ondansetron (ZOFRAN) 8 MG tablet Take 1 tablet (8 mg total) by mouth every 8 (eight) hours as needed for nausea or vomiting. 09/17/17   Jacquelin Hawking, NP  potassium chloride (K-DUR) 10 MEQ tablet Take 10 mEq by mouth daily. 09/02/18   [provider]  prochlorperazine (COMPAZINE) 10 MG tablet Take 1 tablet (10 mg total) by mouth every 6 (six) hours as needed for nausea or vomiting. 09/17/17   Jacquelin Hawking, NP      PHYSICAL EXAMINATION:   VITAL SIGNS: Blood pressure (!) 147/92, pulse 87, temperature (!) 97.4 F (36.3 C), temperature source Oral, resp. rate (!) 21, height 5\' 2"  (1.575 m), weight 32.7 kg, SpO2 98 %.  GENERAL:  83 y.o.-year-old patient lying in the bed with no acute distress.  EYES: Pupils equal, round, reactive to light and accommodation. No scleral icterus. Extraocular muscles intact.  HEENT: Head  atraumatic, normocephalic. Oropharynx and nasopharynx clear.  NECK:  Supple, no jugular venous distention. No thyroid enlargement, no tenderness.  LUNGS: Decreased breath sounds bilaterally, bibasilar crepitations heard. No use of accessory muscles of respiration.  CARDIOVASCULAR: S1, S2 normal. No murmurs, rubs, or gallops.  ABDOMEN: Soft, nontender, nondistended. Bowel sounds present. No organomegaly or mass.  EXTREMITIES: 1 plus pedal edema, no cyanosis, or clubbing.  NEUROLOGIC: Cranial nerves II through XII are intact. Muscle strength 5/5 in all extremities. Sensation intact. Gait not checked.  PSYCHIATRIC: The patient is alert and oriented x 3.  SKIN: No obvious rash, lesion, or ulcer.   LABORATORY PANEL:   CBC Recent Labs  Lab 10/26/18 1525  WBC 9.0  HGB 12.6  HCT 39.6  PLT 247  MCV 83.4  MCH 26.5  MCHC 31.8  RDW 16.0*   ------------------------------------------------------------------------------------------------------------------  Chemistries  Recent Labs  Lab 10/26/18 1525  NA 141  K 3.4*  CL 102  CO2 31  GLUCOSE 110*  BUN 16  CREATININE 0.52  CALCIUM 8.4*  AST 36  ALT 30  ALKPHOS 141*  BILITOT 0.4   ------------------------------------------------------------------------------------------------------------------ estimated creatinine clearance is 29 mL/min (by C-G formula based on SCr of 0.52 mg/dL). ------------------------------------------------------------------------------------------------------------------ No results for input(s): TSH, T4TOTAL, T3FREE, THYROIDAB in the last 72 hours.  Invalid input(s): FREET3   Coagulation profile No results for input(s): INR, PROTIME in the last 168 hours. ------------------------------------------------------------------------------------------------------------------- No results for input(s): DDIMER in the last 72  hours. -------------------------------------------------------------------------------------------------------------------  Cardiac Enzymes Recent Labs  Lab 10/26/18 1525  TROPONINI 0.03*   ------------------------------------------------------------------------------------------------------------------ Invalid input(s): POCBNP  ---------------------------------------------------------------------------------------------------------------  Urinalysis    Component Value Date/Time   COLORURINE YELLOW 06/02/2015 1409   APPEARANCEUR CLEAR 06/02/2015 1409   APPEARANCEUR Clear 06/12/2014 0935   LABSPEC 1.005 06/02/2015 1409   LABSPEC 1.013 06/12/2014 0935   PHURINE 5.0 06/02/2015 1409   GLUCOSEU NEGATIVE 06/02/2015 1409   GLUCOSEU Negative 06/12/2014 0935   HGBUR NEGATIVE 06/02/2015 1409   BILIRUBINUR NEGATIVE 06/02/2015 1409   BILIRUBINUR Negative 06/12/2014 0935   KETONESUR NEGATIVE 06/02/2015 1409   PROTEINUR NEGATIVE 06/02/2015 1409   NITRITE NEGATIVE 06/02/2015 1409   LEUKOCYTESUR NEGATIVE 06/02/2015 1409   LEUKOCYTESUR Negative 06/12/2014 0935     RADIOLOGY: Dg Chest Port 1 View  Result Date: 10/26/2018 CLINICAL DATA:  Smoker and chronic shortness of breath. EXAM: PORTABLE CHEST 1 VIEW COMPARISON:  07/18/2018 and prior radiograph FINDINGS: Cardiomegaly noted. New bilateral interstitial opacities are noted suspicious for interstitial edema. Small bilateral pleural effusions, LEFT-greater-than-RIGHT, noted. LEFT LOWER lung consolidation/atelectasis identified. No pneumothorax or acute bony abnormality. IMPRESSION: 1. New bilateral interstitial opacities likely representing pulmonary edema. 2. New small bilateral pleural effusions, LEFT-greater-than-RIGHT, with LEFT LOWER lung consolidation/atelectasis. Electronically Signed   By: Margarette Canada M.D.   On: 10/26/2018 15:53    EKG: Orders placed or performed during the hospital encounter of 10/26/18  . ED EKG  . ED EKG  . EKG  12-Lead  . EKG 12-Lead    IMPRESSION AND PLAN: 83 year old female patient with a known history of atrial fibrillation, hepatocellular carcinoma, chronic congestive heart failure, COPD, hyperlipidemia, hypertension presented to the emergency room for shortness of breath.  -Acute on chronic congestive heart failure exacerbation Admit patient to telemetry Diurese patient with IV Lasix 60 MDQ 12 hourly Input output chart Daily body weights Cardiology consult Resume ACE inhibitor  -Hypokalemia Replace potassium  -COPD appears stable Home dose inhaler therapy  -Atrial fibrillation Continue anticoagulation with Pradaxa  -DVT prophylaxis On Pradaxa for anticoagulation  All the records are reviewed and case discussed with ED provider. Management plans discussed with the patient, family and they are in agreement.  CODE STATUS:Full code Code Status History    Date Active Date Inactive Code Status Order ID Comments User Context   08/14/2018 0300 08/15/2018 1605 Full Code [id number]  Lance Coon, MD Inpatient   12/15/2014 1933 01/06/2015 1806 Full Code [id number]  Corey Harold Inpatient   12/05/2014 0340 12/15/2014 1933 Full Code [id number]  Juluis Mire, MD Inpatient     TOTAL TIME TAKING CARE OF THIS PATIENT: 53 minutes.    Saundra Shelling M.D on 10/26/2018 at 6:31 PM  Between 7am to 6pm - Pager - [phone]  After 6pm go to [email or web address] - password Child psychotherapist Hospitalists  Office  [phone]  CC: Primary care physician; Adin Hector, MD

## 2018-10-26 NOTE — ED Provider Notes (Signed)
[facility] Emergency Department Provider Note       Time seen: ----------------------------------------- 2:59 PM on 10/26/2018 -----------------------------------------   I have reviewed the triage vital signs and the nursing notes.  HISTORY   Chief Complaint Shortness of Breath    HPI Veronica Wang is a 83 y.o. female with a history of atrial fibrillation, liver cancer, stomach cancer, CHF, COPD, GERD, hyperlipidemia, hypertension who presents to the ED for chronic shortness of breath which has worsened over the past 3 days compared to normal.  Patient feels like when she has pneumonia, she feels a tightness in her chest.  She reports some cough, no fever, chills, aches.  She has chronic diarrhea.  Past Medical History:  Diagnosis Date  . Abnormal MRI 09/10/2015  . Atrial fibrillation ([place])   . Cancer ([place])    Liver  . Cancer ([place])    stomach  . Carcinoid tumor of intestine   . CHF (congestive heart failure) ([place])   . COPD (chronic obstructive pulmonary disease) ([place])   . FH: chemotherapy   . GERD (gastroesophageal reflux disease)   . Hyperlipidemia   . Hypertension   . Macular degeneration   . Polycythemia   . Radiation     Patient Active Problem List   Diagnosis Date Noted  . Protein-calorie malnutrition, severe 08/14/2018  . Stroke ([place]) 08/13/2018  . HTN (hypertension) 08/13/2018  . HLD (hyperlipidemia) 08/13/2018  . Chronic combined systolic (congestive) and diastolic (congestive) heart failure ([place]) 11/27/2017  . PAF (paroxysmal atrial fibrillation) ([place]) 11/27/2017  . Abnormal MRI 09/10/2015  . Malnutrition of moderate degree ([place]) 12/06/2014  . Pneumonia 12/05/2014  . COPD (chronic obstructive pulmonary disease) ([place]) 12/05/2014  . 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 CATARACT EXTRACTION  splenectomy  . COLON RESECTION    . COLON SURGERY    . GASTRECTOMY    .  SPLENECTOMY      Allergies No known allergies  Social History Social History   Tobacco Use  . Smoking status: Former Smoker    Types: Cigarettes  . Smokeless tobacco: Never Used  Substance Use Topics  . Alcohol use: No    Alcohol/week: 0.0 standard drinks  . Drug use: No   Review of Systems Constitutional: Negative for fever. Cardiovascular: Negative for chest pain. Respiratory: Positive for shortness of breath Gastrointestinal: Negative for abdominal pain, vomiting and diarrhea. Musculoskeletal: Negative for back pain. Skin: Negative for rash. Neurological: Negative for headaches, focal weakness or numbness.  All systems negative/normal/unremarkable except as stated in the HPI  ____________________________________________   PHYSICAL EXAM:  VITAL SIGNS: ED Triage Vitals  Enc Vitals Group     BP      Pulse      Resp      Temp      Temp src      SpO2      Weight      Height      Head Circumference      Peak Flow      Pain Score      Pain Loc      Pain Edu?      Excl. in [place]?    Constitutional: Alert and oriented.  Chronically ill-appearing, no distress Eyes: Conjunctivae are normal. Normal extraocular movements. ENT      Head: Normocephalic and atraumatic.      Nose: No congestion/rhinnorhea.      Mouth/Throat: Mucous membranes are  moist.      Neck: No stridor. Cardiovascular: Normal rate, regular rhythm. No murmurs, rubs, or gallops. Respiratory: diminished breath sounds bilaterally, mild tachypnea Gastrointestinal: Soft and nontender. Normal bowel sounds Musculoskeletal: Nontender with normal range of motion in extremities.  Mild edema on the ankles Neurologic:  Normal speech and language. No gross focal neurologic deficits are appreciated.  Skin:  Skin is warm, dry and intact. No rash noted. Psychiatric: Mood and affect are normal. Speech and behavior are normal.  ____________________________________________  EKG: Interpreted by me.  Sinus rhythm  with a rate of 79 bpm, LVH with repolarization abnormality, normal QT, normal axis  ____________________________________________  ED COURSE:  As part of my medical decision making, I reviewed the following data within the [place] History obtained from family if available, nursing notes, old chart and ekg, as well as notes from prior ED visits. Patient presented for dyspnea, we will assess with labs and imaging as indicated at this time.   Procedures  Tilden Fossa was evaluated in Emergency Department on 10/26/2018 for the symptoms described in the history of present illness. She was evaluated in the context of the global COVID-19 pandemic, which necessitated consideration that the patient might be at risk for infection with the SARS-CoV-2 virus that causes COVID-19. Institutional protocols and algorithms that pertain to the evaluation of patients at risk for COVID-19 are in a state of rapid change based on information released by regulatory bodies including the CDC and federal and state organizations. These policies and algorithms were followed during the patient's care in the ED.  ____________________________________________   LABS (pertinent positives/negatives)  Labs Reviewed  CBC - Abnormal; Notable for the following components:      Result Value   RDW 16.0 (*)    All other components within normal limits  TROPONIN I - Abnormal; Notable for the following components:   Troponin I 0.03 (*)    All other components within normal limits  COMPREHENSIVE METABOLIC PANEL - Abnormal; Notable for the following components:   Potassium 3.4 (*)    Glucose, Bld 110 (*)    Calcium 8.4 (*)    Total Protein 6.2 (*)    Albumin 3.4 (*)    Alkaline Phosphatase 141 (*)    All other components within normal limits  BRAIN NATRIURETIC PEPTIDE - Abnormal; Notable for the following components:   B Natriuretic Peptide 1,693.0 (*)    All other components within normal limits  SARS  CORONAVIRUS 2 (HOSPITAL ORDER, [facility])    RADIOLOGY Images were viewed by me  Chest x-ray IMPRESSION: 1. New bilateral interstitial opacities likely representing pulmonary edema. 2. New small bilateral pleural effusions, LEFT-greater-than-RIGHT, with LEFT LOWER lung consolidation/atelectasis. ____________________________________________   DIFFERENTIAL DIAGNOSIS   Pneumonia, coronavirus, CHF, COPD, viral infection  FINAL ASSESSMENT AND PLAN  Dyspnea, CHF exacerbation, COPD   Plan: The patient had presented for dyspnea. Patient's labs are more consistent with congestive heart failure. Patient's imaging revealed bilateral interstitial opacities and bilateral pleural effusions.  We did start her on Lasix here.  She also received a DuoNeb and IV steroids.  She states she does not feel well enough to go home, I will discuss with the hospitalist for admission.   Laurence Aly, MD    Note: This note was generated in part or whole with voice recognition software. Voice recognition is usually quite accurate but there are transcription errors that can and very often do occur. I apologize  for any typographical errors that were not detected and corrected.     Earleen Newport, MD 10/26/18 [phone]

## 2018-10-27 ENCOUNTER — Inpatient Hospital Stay
Admit: 2018-10-27 | Discharge: 2018-10-27 | Disposition: A | Discharge status: Home / Self Care | Payer: Medicare Other | Attending: Internal Medicine | Admitting: Internal Medicine

## 2018-10-27 LAB — BASIC METABOLIC PANEL
Anion gap: 9 (ref 5–15)
BUN: 19 mg/dL (ref 8–23)
CO2: 34 mmol/L — ABNORMAL HIGH (ref 22–32)
Calcium: 8.7 mg/dL — ABNORMAL LOW (ref 8.9–10.3)
Chloride: 98 mmol/L (ref 98–111)
Creatinine, Ser: 0.5 mg/dL (ref 0.44–1.00)
GFR calc Af Amer: >60 mL/min (ref >60)
GFR calc non Af Amer: >60 mL/min (ref >60)
Glucose, Bld: 88 mg/dL (ref 70–99)
Potassium: 3.4 mmol/L — ABNORMAL LOW (ref 3.5–5.1)
Sodium: 141 mmol/L (ref 135–145)

## 2018-10-27 MED ORDER — FUROSEMIDE 10 MG/ML IJ SOLN
20.0000 mg | Freq: Two times a day (BID) | INTRAMUSCULAR | Status: DC
  Administered 2018-10-27 – 2018-10-29 (×4): 20 mg via INTRAVENOUS
  Filled 2018-10-27 (×4): qty 2

## 2018-10-27 MED ORDER — POTASSIUM CHLORIDE 20 MEQ/15ML (10%) PO SOLN
40.0000 meq | Freq: Every day | ORAL | Status: DC
  Administered 2018-10-27 – 2018-10-30 (×4): 40 meq via ORAL
  Filled 2018-10-27 (×4): qty 30

## 2018-10-27 MED ORDER — DILTIAZEM HCL 25 MG/5ML IV SOLN
10.0000 mg | Freq: Once | INTRAVENOUS | Status: DC
  Filled 2018-10-27: qty 5

## 2018-10-27 NOTE — Progress Notes (Signed)
*  PRELIMINARY RESULTS* Echocardiogram 2D Echocardiogram has been performed.  Veronica Wang 10/27/2018, 9:23 AM

## 2018-10-27 NOTE — Progress Notes (Signed)
Pastoral [facility] Visit    10/27/18 1500  Clinical Encounter Type  Visited With Patient  Visit Type Initial;Other (Comment) (HCPOA rqst)  Referral From Physician  Consult/Referral To Chaplain  Spiritual Encounters  Spiritual Needs Literature  Stress Factors  Patient Stress Factors None identified   Chap visited pt for [facility] education (unable to complete at current time due to [place] restrictions).  Pt was unaware that MD had put in OR.  Pt was aware of what HCPOA is and claims to have completed one 10 yrs ago. The two individuals on her HCPOA are still surviving and she does not wish to make changes at this time.  Pt did indicate that she needs to find it and bring a copy to [place]. Melven Sartorius confirmed that this should be done so her wishes are followed.  Melven Sartorius gave brief explanation of document and gave new copy of forms in case pt wishes to review or update.  Pt was appreciative for visit.  Darcey Nora, Chaplain

## 2018-10-27 NOTE — Evaluation (Signed)
Physical Therapy Evaluation Patient Details Name: Veronica Wang MRN: [id number] DOB: 08-Jun-1936 Today's Date: 10/27/2018   History of Present Illness  Patient is an 83 year old female with known history of atrial fibrillation, hepatocellular carcinoma, chronic congestive heart failure, COPD, hyperlipidemia, hypertension presented to the emergency room for shortness of breath.  Clinical Impression  Patient awake and semirecumbent in bed with 1L O2 via [place] and no bed alarm on upon PT arrival. Patient was amenable to therapy. Patient able to perform all bed mobility, transfers, and gait independently with no apparent deviations/abnormalities of movement. MMT revealed hip flexion weakness bilaterally (3/5) and limitations in knee flexion (3+/5) bilaterally. Patient able to perform Five Time Sit to Stand in 17 sec from elevated surface with no use of UE support while maintaining SpO2 at/above 92% with 1L O2 via [place]. Patient able to ambulate 100 feet independently but did note some feeling of fatigue and decreased SpO2. SpO2 after walking 100 feet (on 1L O2 via [place]) dropped to 89% but recovered to 92% in roughly 2 minutes sitting rest.   Patient will benefit from continued skilled therapeutic intervention to address strength deficits, activity tolerance deficits, and improve energy conservation techniques. Based on PT evaluation, patient is appropriate to discharge home with Home Health PT to improve strength and activity tolerance for overall improvement in QOL.   Patient in chair with tray table, call bell in reach at PT departure; all needs met.     Follow Up Recommendations Home health PT;Supervision - Intermittent    Equipment Recommendations  None recommended by PT    Recommendations for Other Services       Precautions / Restrictions Precautions Precautions: Fall Restrictions Weight Bearing Restrictions: No      Mobility  Bed Mobility Overal bed mobility: Independent                 Transfers Overall transfer level: Independent                  Ambulation/Gait Ambulation/Gait assistance: Independent Gait Distance (Feet): 100 Feet Assistive device: None Gait Pattern/deviations: WFL(Within Functional Limits)   Gait velocity interpretation: 1.31 - 2.62 ft/sec, indicative of limited community ambulator General Gait Details: Patient's gait is well WNLs and patient showed no signs of unsteadiness with head turns, dual task conversation, and negotiating turns. Patient only walked 100 feet due to feeling fatigued and feeling that her SpO2 was dropping (89%).   Stairs            Wheelchair Mobility    Modified Rankin (Stroke Patients Only)       Balance Overall balance assessment: Independent;No apparent balance deficits (not formally assessed)                                           Pertinent Vitals/Pain Pain Assessment: No/denies pain(Patient reported intermittent cramping in the legs prior to the session)    Home Living Family/patient expects to be discharged to:: Private residence Living Arrangements: Children(Daughter) Available Help at Discharge: Family;Available 24 hours/day Type of Home: House Home Access: Stairs to enter Entrance Stairs-Rails: Can reach both Entrance Stairs-Number of Steps: 3 Home Layout: Two level;Able to live on main level with bedroom/bathroom Home Equipment: Kasandra Knudsen - single point;Walker - 4 wheels      Prior Function Level of Independence: Independent         Comments: Patient  reports being independent with all mobility and ADLs as well as community activities until about 2 weeks ago when she started to need some assistance with shopping.     Hand Dominance   Dominant Hand: Right    Extremity/Trunk Assessment   Upper Extremity Assessment Upper Extremity Assessment: Overall WFL for tasks assessed    Lower Extremity Assessment Lower Extremity Assessment: Generalized  weakness       Communication   Communication: No difficulties  Cognition Arousal/Alertness: Awake/alert Behavior During Therapy: WFL for tasks assessed/performed Overall Cognitive Status: Within Functional Limits for tasks assessed                                        General Comments      Exercises General Exercises - Lower Extremity Ankle Circles/Pumps: AROM;15 reps Heel Slides: AROM;Both;10 reps Hip Flexion/Marching: AROM;10 reps;Both Other Exercises Other Exercises: Five Time Sit to Stand: 17 sec from elevated surface with no UE use. SpO2 pre: 92%; post: 94% Other Exercises: Functional transfer to chair: independent and no use of UE for controlled descent to seat.   Assessment/Plan    PT Assessment Patient needs continued PT services  PT Problem List Decreased strength;Decreased mobility;Decreased activity tolerance;Cardiopulmonary status limiting activity       PT Treatment Interventions Functional mobility training;Balance training;Patient/family education;Gait training;Therapeutic activities;Neuromuscular re-education;Therapeutic exercise;Stair training    PT Goals (Current goals can be found in the Care Plan section)  Acute Rehab PT Goals Patient Stated Goal: get stronger and breathe better PT Goal Formulation: With patient Time For Goal Achievement: 11/10/18 Potential to Achieve Goals: Good    Frequency Min 2X/week   Barriers to discharge        Co-evaluation               AM-PAC PT "6 Clicks" Mobility  Outcome Measure Help needed turning from your back to your side while in a flat bed without using bedrails?: None Help needed moving from lying on your back to sitting on the side of a flat bed without using bedrails?: None Help needed moving to and from a bed to a chair (including a wheelchair)?: None Help needed standing up from a chair using your arms (e.g., wheelchair or bedside chair)?: None Help needed to walk in hospital  room?: None Help needed climbing 3-5 steps with a railing? : None 6 Click Score: 24    End of Session Equipment Utilized During Treatment: Gait belt;Oxygen(1L O2) Activity Tolerance: Patient tolerated treatment well Patient left: in chair;with call bell/phone within reach Nurse Communication: Mobility status(Patient is independent will all mobility.) PT Visit Diagnosis: Muscle weakness (generalized) (M62.81);Difficulty in walking, not elsewhere classified (R26.2)    Time: 9292-4462 PT Time Calculation (min) (ACUTE ONLY): 28 min   Charges:   PT Evaluation $PT Eval Low Complexity: 1 Low PT Treatments $Therapeutic Exercise: 8-22 mins       Myles Gip PT, DPT [phone]  10/27/2018, 3:12 PM

## 2018-10-27 NOTE — Plan of Care (Signed)
  Problem: Clinical Measurements: Goal: Ability to maintain clinical measurements within normal limits will improve Outcome: Not Progressing Note:  Potassium level was low today at only 3.4. Scheduled and PRN replacement given. Will continue to monitor lab values. Wenda Low [facility]

## 2018-10-27 NOTE — Progress Notes (Signed)
[place] [facility] [place] NAME: Cira Deyoe    MR#:  [id number]  DATE OF BIRTH:  1936/05/18  SUBJECTIVE:   a little bit better this am   REVIEW OF SYSTEMS:    Review of Systems  Constitutional: Negative for fever, chills weight loss HENT: Negative for ear pain, nosebleeds, congestion, facial swelling, rhinorrhea, neck pain, neck stiffness and ear discharge.   Respiratory: Negative for cough, ++IMPROVED shortness of breath, NO wheezing  Cardiovascular: Negative for chest pain, palpitations and leg swelling.  Gastrointestinal: Negative for heartburn, abdominal pain, vomiting, diarrhea or consitpation Genitourinary: Negative for dysuria, urgency, frequency, hematuria Musculoskeletal: Negative for back pain or joint pain Neurological: Negative for dizziness, seizures, syncope, focal weakness,  numbness and headaches.  Hematological: Does not bruise/bleed easily.  Psychiatric/Behavioral: Negative for hallucinations, confusion, dysphoric mood    Tolerating Diet: yes      DRUG ALLERGIES:   Allergies  Allergen Reactions  . No Known Allergies     VITALS:  Blood pressure 120/68, pulse 79, temperature 98.3 F (36.8 C), temperature source Oral, resp. rate 18, height 5\' 2"  (1.575 m), weight 31.3 kg, SpO2 95 %.  PHYSICAL EXAMINATION:  Constitutional: Appears thin frail. No distress. HENT: Normocephalic. Marland Kitchen Oropharynx is clear and moist.  Eyes: Conjunctivae and EOM are normal. PERRLA, no scleral icterus.  Neck: Normal ROM. Neck supple. No JVD. No tracheal deviation. CVS: RRR, S1/S2 +, 3/6 murmurs, no gallops, no carotid bruit.  Pulmonary: Effort and breath sounds normal,crackles/rales at bases decreased breath sound left base  Abdominal: Soft. BS +,  no distension, tenderness, rebound or guarding.  Musculoskeletal: Normal range of motion. No edema and no tenderness.  Neuro: Alert. CN 2-12 grossly intact. No focal deficits. Skin: Skin is warm  and dry. No rash noted. Psychiatric: Normal mood and affect.      LABORATORY PANEL:   CBC Recent Labs  Lab 10/26/18 1525  WBC 9.0  HGB 12.6  HCT 39.6  PLT 247   ------------------------------------------------------------------------------------------------------------------  Chemistries  Recent Labs  Lab 10/26/18 1525 10/27/18 0504  NA 141 141  K 3.4* 3.4*  CL 102 98  CO2 31 34*  GLUCOSE 110* 88  BUN 16 19  CREATININE 0.52 0.50  CALCIUM 8.4* 8.7*  AST 36  --   ALT 30  --   ALKPHOS 141*  --   BILITOT 0.4  --    ------------------------------------------------------------------------------------------------------------------  Cardiac Enzymes Recent Labs  Lab 10/26/18 1525  TROPONINI 0.03*   ------------------------------------------------------------------------------------------------------------------  RADIOLOGY:  Dg Chest Port 1 View  Result Date: 10/26/2018 CLINICAL DATA:  Smoker and chronic shortness of breath. EXAM: PORTABLE CHEST 1 VIEW COMPARISON:  07/18/2018 and prior radiograph FINDINGS: Cardiomegaly noted. New bilateral interstitial opacities are noted suspicious for interstitial edema. Small bilateral pleural effusions, LEFT-greater-than-RIGHT, noted. LEFT LOWER lung consolidation/atelectasis identified. No pneumothorax or acute bony abnormality. IMPRESSION: 1. New bilateral interstitial opacities likely representing pulmonary edema. 2. New small bilateral pleural effusions, LEFT-greater-than-RIGHT, with LEFT LOWER lung consolidation/atelectasis. Electronically Signed   By: Margarette Canada M.D.   On: 10/26/2018 15:53     ASSESSMENT AND PLAN:   83 year old female with a history of PAF, chronic systolic heart failure ejection fraction of 45% and COPD presented to emergency room due to shortness of breath.   1.  Acute hypoxic respiratory failure in the setting of acute on chronic systolic heart failure: Patient has been weaned to room air. Patient was  tested for COVID-19 and she was negative  2.  Acute on chronic systolic heart failure ejection fraction 40 to 45%: Continue IV Lasix Monitor intake and output Is currently 1.1 L negative.  Would aim for approximately 2 to 3 L negative. Follow-up on today's echo [facility] cardiology consultation requested by admitting MD CHF clinic upon discharge Continue lisinopril Consider adding beta-blocker once acute CHF resolves. Plan to repeat chest x-ray tomorrow to evaluate bilateral pleural effusions after diuresis.   3.  PAF: Patient would benefit from beta-blocker for better heart rate control. Continue Pradaxa for stroke prevention  4.  Hypokalemia: Replete  5.  Hyperlipidemia: Continue statin  6.  COPD without signs of exacerbation  7.  Murmur: Follow-up on today's echo  Physical therapy for discharge planning  Management plans discussed with the patient and she is in agreement.  CODE STATUS: DNR  TOTAL TIME TAKING CARE OF THIS PATIENT: 30 minutes.     POSSIBLE D/C 1-2 days, DEPENDING ON CLINICAL CONDITION.   Bettey Costa M.D on 10/27/2018 at 10:28 AM  Between 7am to 6pm - Pager - [phone] After 6pm go to [email or web address] - password [email or web address] [place]  Office  [phone]  CC: Primary care physician; Adin Hector, MD  Note: This dictation was prepared with Dragon dictation along with smaller phrase technology. Any transcriptional errors that result from this process are unintentional.

## 2018-10-27 NOTE — Progress Notes (Signed)
Family Meeting Note  Advance Directive:yes  Today a meeting took place with the Patient.    The following clinical team members were present during this meeting:MD  The following were discussed:Patient's diagnosis: chf exacerbation , Patient's progosis: Unable to determine and Goals for treatment: DNR  Additional follow-up to be provided: chaplain to update AD Palliative care care at discharge. Time spent during [place] minutes  Bettey Costa, MD

## 2018-10-27 NOTE — Progress Notes (Signed)
Patient given an IS and instructed on use. Will continue to monitor compliance. Veronica Wang [facility]

## 2018-10-28 ENCOUNTER — Inpatient Hospital Stay: Payer: Medicare Other

## 2018-10-28 LAB — BASIC METABOLIC PANEL
Anion gap: 9 (ref 5–15)
BUN: 22 mg/dL (ref 8–23)
CO2: 34 mmol/L — ABNORMAL HIGH (ref 22–32)
Calcium: 8.8 mg/dL — ABNORMAL LOW (ref 8.9–10.3)
Chloride: 99 mmol/L (ref 98–111)
Creatinine, Ser: 0.61 mg/dL (ref 0.44–1.00)
GFR calc Af Amer: >60 mL/min (ref >60)
GFR calc non Af Amer: >60 mL/min (ref >60)
Glucose, Bld: 95 mg/dL (ref 70–99)
Potassium: 4 mmol/L (ref 3.5–5.1)
Sodium: 142 mmol/L (ref 135–145)

## 2018-10-28 LAB — ECHOCARDIOGRAM COMPLETE
Height: 62 in
Weight: 1102.4 oz

## 2018-10-28 LAB — BRAIN NATRIURETIC PEPTIDE: B Natriuretic Peptide: 1110 pg/mL — ABNORMAL HIGH (ref 0.0–100.0)

## 2018-10-28 MED ORDER — DILTIAZEM HCL 100 MG IV SOLR
5.0000 mg/h | INTRAVENOUS | Status: DC
  Filled 2018-10-28: qty 100

## 2018-10-28 MED ORDER — DILTIAZEM HCL 25 MG/5ML IV SOLN: 5.0000 mg | Freq: Four times a day (QID) | INTRAVENOUS | Status: DC | PRN

## 2018-10-28 MED ORDER — DILTIAZEM HCL-DEXTROSE 100-5 MG/100ML-% IV SOLN (PREMIX)
5.0000 mg/h | INTRAVENOUS | Status: DC
  Filled 2018-10-28 (×2): qty 100

## 2018-10-28 MED ORDER — ENSURE ENLIVE PO LIQD
237.0000 mL | Freq: Two times a day (BID) | ORAL | Status: DC
  Administered 2018-10-28 – 2018-10-30 (×4): 237 mL via ORAL

## 2018-10-28 MED ORDER — METOPROLOL TARTRATE 25 MG PO TABS
25.0000 mg | ORAL_TABLET | Freq: Two times a day (BID) | ORAL | Status: DC
  Administered 2018-10-28 – 2018-10-30 (×5): 25 mg via ORAL
  Filled 2018-10-28 (×5): qty 1

## 2018-10-28 MED ORDER — NON FORMULARY: 5.0000 mg | Freq: Every day | Status: DC

## 2018-10-28 MED ORDER — MELATONIN 5 MG PO TABS
5.0000 mg | ORAL_TABLET | Freq: Every day | ORAL | Status: DC
  Administered 2018-10-28 – 2018-10-29 (×2): 5 mg via ORAL
  Filled 2018-10-28 (×3): qty 1

## 2018-10-28 MED ORDER — DILTIAZEM HCL-DEXTROSE 100-5 MG/100ML-% IV SOLN (PREMIX): 5.0000 mg/h | INTRAVENOUS | Status: DC

## 2018-10-28 NOTE — Progress Notes (Signed)
Initial Nutrition Assessment  DOCUMENTATION CODES:   Underweight  INTERVENTION:   Ensure Enlive po BID, each supplement provides 350 kcal and 20 grams of protein  MVI daily   Liberalize diet   NUTRITION DIAGNOSIS:   Increased nutrient needs related to chronic illness(COPD, CHF ) as evidenced by increased estimated needs.  GOAL:   Patient will meet greater than or equal to 90% of their needs  MONITOR:   PO intake, Supplement acceptance, Labs, Weight trends, I & O's, Skin  REASON FOR ASSESSMENT:   Consult Assessment of nutrition requirement/status  ASSESSMENT:   83 year old female with a history of PAF, chronic systolic heart failure ejection fraction of 45% and COPD presented to emergency room due to shortness of breath.  RD working remotely.  Pt eating 95% of meals today and drinking supplements. Per chart, pt appears fairly weight stable pta. Patient likely with severe chronic malnutrition but unable to diagnose at this time as exam cannot be performed today. RD will liberalize diet and add supplements. Education not appropriate at this time as pt not likely eating enough to exceed nutrient limits.   Medications reviewed and include: vitamin D, lasix, melatonin, MVI, KCl, B-12  Labs reviewed: BNP- 1110(H)  Unable to complete Nutrition-Focused physical exam at this time.   Diet Order:   Diet Order            Diet heart healthy/carb modified Room service appropriate? Yes; Fluid consistency: Thin  Diet effective now             EDUCATION NEEDS:   Not appropriate for education at this time  Skin:  Skin Assessment: Reviewed RN Assessment(ecchymosis )  Last BM:  4/18  Height:   Ht Readings from Last 1 Encounters:  10/26/18 5\' 2"  (1.575 m)    Weight:   Wt Readings from Last 1 Encounters:  10/28/18 29.8 kg    Ideal Body Weight:  50 kg  BMI:  Body mass index is 12.02 kg/m.  Estimated Nutritional Needs:   Kcal:  1000-1200kcal/day   Protein:   50-60  Fluid:  831ml/day   Koleen Distance MS, RD, LDN Pager #- [phone] Office#- [phone] After Hours Pager: [phone]

## 2018-10-28 NOTE — Consult Note (Signed)
[facility] Cardiology Consultation Note  Patient ID: Veronica Wang, MRN: [id number], DOB/AGE: 1936/06/29 83 y.o. Admit date: 10/26/2018   Date of Consult: 10/28/2018 Primary Physician: Veronica Hector, Wang Primary Cardiologist: Veronica Wang  Chief Complaint:  Chief Complaint  Patient presents with  . Shortness of Breath   Reason for Consult: Atrial fibrillation  HPI: 83 y.o. female with known paroxysmal nonvalvular atrial fibrillation mild LV systolic dysfunction with ejection fraction of 40% having acute issues of shortness of breath weakness and hypoxia with a chest x-ray showing pulmonary edema and effusions and a BNP of 1110 and a troponin normal.  Since admission the patient has received intravenous furosemide with significant improvements in hypoxia shortness of breath and pulmonary edema.  The patient additionally has had atrial fibrillation with rapid ventricular rate currently on diltiazem drip now spontaneously converted into normal sinus rhythm.  Current EKG shows normal sinus rhythm with left atrial enlargement anterior T wave inversions.  She does have chronic obstructive pulmonary disease which may also contribute as well.  She previously has peripheral vascular disease as well as stroke possibly due to previous atrial fibrillation now stable at this time on anticoagulation including Pradaxa.  The patient does have high intensity cholesterol therapy stable on atorvastatin.  She is much better since admission with no further episodes of significant concerns and stable heart rate control  Past Medical History:  Diagnosis Date  . Abnormal MRI 09/10/2015  . Atrial fibrillation ([place])   . Cancer ([place])    Liver  . Cancer ([place])    stomach  . Carcinoid tumor of intestine   . CHF (congestive heart failure) ([place])   . COPD (chronic obstructive pulmonary disease) ([place])   . FH: chemotherapy   . GERD (gastroesophageal reflux disease)   . Hyperlipidemia   . Hypertension   . Macular  degeneration   . Polycythemia   . Radiation       Surgical History:  Past Surgical History:  Procedure Laterality Date  . CATARACT EXTRACTION  splenectomy  . COLON RESECTION    . COLON SURGERY    . GASTRECTOMY    . SPLENECTOMY       Home Meds: Prior to Admission medications   Medication Sig Start Date End Date Taking? Authorizing Provider  atorvastatin (LIPITOR) 40 MG tablet Take 1 tablet (40 mg total) by mouth daily at 6 PM. 08/15/18  Yes Mayo, Pete Pelt, Wang  Cholecalciferol (VITAMIN D-1000 MAX ST) 1000 UNITS tablet Take 1,000 Units by mouth daily.    Yes Provider, Historical, Wang  Cyanocobalamin (VITAMIN B-12 CR) 1000 MCG TBCR Take 1 tablet by mouth daily.  03/02/11  Yes Provider, Historical, Wang  dabigatran (PRADAXA) 75 MG CAPS capsule Take 75 mg by mouth 2 (two) times daily.   Yes Provider, Historical, Wang  LANREOTIDE ACETATE [place] Inject into the skin.   Yes Provider, Historical, Wang  lisinopril (PRINIVIL,ZESTRIL) 5 MG tablet Take 5 mg by mouth daily. 06/18/18  Yes Provider, Historical, Wang  Multiple Vitamins-Minerals (CENTRUM SILVER) tablet Take 1 tablet by mouth daily.  03/02/11  Yes Provider, Historical, Wang  octreotide (SANDOSTATIN) 100 MCG/ML SOLN injection Inject 100 mcg into the skin every 30 (thirty) days. 05/11/18  Yes Provider, Historical, Wang  Telotristat Etiprate 250 MG TABS Take 1 tablet by mouth 3 (three) times daily. 05/27/18  Yes Provider, Historical, Wang  acetaminophen (TYLENOL) 325 MG tablet Take 2 tablets (650 mg total) by mouth every 6 (six) hours as needed for mild pain (  or Fever >/= 101). 12/15/14   Veronica High III, Wang  albuterol (PROVENTIL HFA;VENTOLIN HFA) 108 (90 BASE) MCG/ACT inhaler Inhale 2 puffs into the lungs every 6 (six) hours as needed for wheezing or shortness of breath.    Provider, Historical, Wang  ELIQUIS 2.5 MG TABS tablet Take 2.5 mg by mouth every 12 (twelve) hours. 10/04/18   Provider, Historical, Wang  famotidine (PEPCID) 20 MG tablet Take 20 mg by mouth  daily.     Provider, Historical, Wang  feeding supplement (BOOST HIGH PROTEIN) LIQD Take 1 Container by mouth 3 (three) times daily between meals.    Provider, Historical, Wang  furosemide (LASIX) 20 MG tablet Take 20 mg by mouth daily. 09/02/18   Provider, Historical, Wang  lidocaine (XYLOCAINE) 2 % solution Use as directed 15 mLs in the mouth or throat every 6 (six) hours as needed (chest/stomach pain). Patient not taking: Reported on 08/13/2018 07/18/18   Veronica Wang  loperamide (IMODIUM A-D) 2 MG tablet Take 4 mg by mouth 4 (four) times daily as needed for diarrhea or loose stools.    Provider, Historical, Wang  ondansetron (ZOFRAN) 8 MG tablet Take 1 tablet (8 mg total) by mouth every 8 (eight) hours as needed for nausea or vomiting. 09/17/17   Veronica Wang  potassium chloride (K-DUR) 10 MEQ tablet Take 10 mEq by mouth daily. 09/02/18   Provider, Historical, Wang  prochlorperazine (COMPAZINE) 10 MG tablet Take 1 tablet (10 mg total) by mouth every 6 (six) hours as needed for nausea or vomiting. 09/17/17   Veronica Wang    Inpatient Medications:  . atorvastatin  40 mg Oral q1800  . cholecalciferol  1,000 Units Oral Daily  . dabigatran  75 mg Oral BID  . diltiazem  10 mg Intravenous Once  . feeding supplement (ENSURE ENLIVE)  237 mL Oral BID BM  . furosemide  20 mg Intravenous Q12H  . lisinopril  5 mg Oral Daily  . mouth rinse  15 mL Mouth Rinse BID  . Melatonin  5 mg Oral QHS  . multivitamin with minerals  1 tablet Oral Daily  . potassium chloride  40 mEq Oral Daily  . sodium chloride flush  3 mL Intravenous Q12H  . Telotristat Ethyl(as Etiprate)  1 tablet Oral TID  . vitamin B-12  1,000 mcg Oral Daily   . sodium chloride    . diltiazem (CARDIZEM) infusion      Allergies:  Allergies  Allergen Reactions  . No Known Allergies     Social History   Socioeconomic History  . Marital status: Divorced    Spouse name: Not on file  . Number of children: Not on file  .  Years of education: Not on file  . Highest education level: Not on file  Occupational History  . Not on file  Social Needs  . Financial resource strain: Not on file  . Food insecurity:    Worry: Not on file    Inability: Not on file  . Transportation needs:    Medical: Not on file    Non-medical: Not on file  Tobacco Use  . Smoking status: Former Smoker    Types: Cigarettes  . Smokeless tobacco: Never Used  Substance and Sexual Activity  . Alcohol use: No    Alcohol/week: 0.0 standard drinks  . Drug use: No  . Sexual activity: Not on file  Lifestyle  . Physical activity:    Days per week: Not on  file    Minutes per session: Not on file  . Stress: Not on file  Relationships  . Social connections:    Talks on phone: Not on file    Gets together: Not on file    Attends religious service: Not on file    Active member of club or organization: Not on file    Attends meetings of clubs or organizations: Not on file    Relationship status: Not on file  . Intimate partner violence:    Fear of current or ex partner: Not on file    Emotionally abused: Not on file    Physically abused: Not on file    Forced sexual activity: Not on file  Other Topics Concern  . Not on file  Social History Narrative  . Not on file     Family History  Problem Relation Age of Onset  . Heart attack Sister   . Heart attack Brother   . Leukemia Mother      Review of Systems Positive for shortness of breath Negative for: General:  chills, fever, night sweats or weight changes.  Cardiovascular: PND orthopnea syncope dizziness  Dermatological skin lesions rashes Respiratory: Cough congestion Urologic: Frequent urination urination at night and hematuria Abdominal: negative for nausea, vomiting, diarrhea, bright red blood per rectum, melena, or hematemesis Neurologic: negative for visual changes, and/or hearing changes  All other systems reviewed and are otherwise negative except as noted  above.  Labs: Recent Labs    10/26/18 1525  TROPONINI 0.03*   Lab Results  Component Value Date   WBC 9.0 10/26/2018   HGB 12.6 10/26/2018   HCT 39.6 10/26/2018   MCV 83.4 10/26/2018   PLT 247 10/26/2018    Recent Labs  Lab 10/26/18 1525  10/28/18 0459  NA 141   < > 142  K 3.4*   < > 4.0  CL 102   < > 99  CO2 31   < > 34*  BUN 16   < > 22  CREATININE 0.52   < > 0.61  CALCIUM 8.4*   < > 8.8*  PROT 6.2*  --   --   BILITOT 0.4  --   --   ALKPHOS 141*  --   --   ALT 30  --   --   AST 36  --   --   GLUCOSE 110*   < > 95   < > = values in this interval not displayed.   Lab Results  Component Value Date   CHOL 158 08/14/2018   HDL 69 08/14/2018   LDLCALC 71 08/14/2018   TRIG 89 08/14/2018   No results found for: DDIMER  Radiology/Studies:  Dg Chest 1 View  Result Date: 10/28/2018 CLINICAL DATA:  83 year old female with shortness of breath. EXAM: CHEST  1 VIEW COMPARISON:  10/26/2018 and earlier. FINDINGS: Portable AP upright view at 0502 hours. Persistent left pleural effusion with associated lung base opacity. Smaller right pleural effusion also again suspected. Decreased bilateral pulmonary interstitial opacity since 10/26/2018. Mild indistinct asymmetric peripheral right upper lung opacity. No pneumothorax. Stable cardiomegaly and mediastinal contours. Paucity of bowel gas in the upper abdomen. No acute osseous abnormality identified. IMPRESSION: Suspect regressed pulmonary interstitial edema since 10/26/2018 with persistent small to moderate left and small right pleural effusions. However, there is also mild indistinct right upper lung opacity. Respiratory infection is difficult to exclude. Electronically Signed   By: Genevie Ann M.D.   On: 10/28/2018 06:51  Dg Chest Port 1 View  Result Date: 10/26/2018 CLINICAL DATA:  Smoker and chronic shortness of breath. EXAM: PORTABLE CHEST 1 VIEW COMPARISON:  07/18/2018 and prior radiograph FINDINGS: Cardiomegaly noted. New  bilateral interstitial opacities are noted suspicious for interstitial edema. Small bilateral pleural effusions, LEFT-greater-than-RIGHT, noted. LEFT LOWER lung consolidation/atelectasis identified. No pneumothorax or acute bony abnormality. IMPRESSION: 1. New bilateral interstitial opacities likely representing pulmonary edema. 2. New small bilateral pleural effusions, LEFT-greater-than-RIGHT, with LEFT LOWER lung consolidation/atelectasis. Electronically Signed   By: Margarette Canada M.D.   On: 10/26/2018 15:53    EKG: Normal sinus rhythm with left atrial enlargement anterior T wave inversions  Weights: Filed Weights   10/26/18 2001 10/27/18 0432 10/28/18 0520  Weight: 31.5 kg 31.3 kg 29.8 kg     Physical Exam: Blood pressure 126/70, pulse 77, temperature 97.9 F (36.6 C), temperature source Oral, resp. rate 20, height 5\' 2"  (1.575 m), weight 29.8 kg, SpO2 100 %. Body mass index is 12.02 kg/m. General: Well developed, well nourished, in no acute distress. Head eyes ears nose throat: Normocephalic, atraumatic, sclera non-icteric, no xanthomas, nares are without discharge. No apparent thyromegaly and/or mass  Lungs: Normal respiratory effort.  no wheezes, few rales, no rhonchi.  Heart: RRR with normal S1 S2. no murmur gallop, no rub, PMI is normal size and placement, carotid upstroke normal without bruit, jugular venous pressure is normal Abdomen: Soft, non-tender, non-distended with normoactive bowel sounds. No hepatomegaly. No rebound/guarding. No obvious abdominal masses. Abdominal aorta is normal size without bruit Extremities: Trace edema. no cyanosis, no clubbing, no ulcers  Peripheral : 2+ bilateral upper extremity pulses, 2+ bilateral femoral pulses, 2+ bilateral dorsal pedal pulse Neuro: Alert and oriented. No facial asymmetry. No focal deficit. Moves all extremities spontaneously. Musculoskeletal: Normal muscle tone without kyphosis Psych:  Responds to questions appropriately with a  normal affect.    Assessment: 83 year old female with acute on chronic systolic dysfunction congestive heart failure without evidence of myocardial infarction and recurrent atrial fibrillation with rapid ventricular rate and abnormal EKG slightly improved after appropriate medication management  Plan: 1.  Continue furosemide injection and potentially change to oral medication management tomorrow 2.  Continue anticoagulation for further risk reduction in stroke with current recurrent atrial fibrillation and previous history of TIA and stroke 3.  Change diltiazem drip after spontaneous conversion to normal sinus rhythm to metoprolol orally 4.  High intensity cholesterol therapy 5.  Further adjustments of medication management after above  Signed, Corey Skains M.D. [facility] 10/28/2018, 1:15 PM

## 2018-10-28 NOTE — Progress Notes (Signed)
Update given to daughter Veronica Wang [phone])

## 2018-10-28 NOTE — Progress Notes (Signed)
MD notified. Pt is going in and out of a-fib and can be rvr. Pt is currently afib in 120s.  MD acknowledged. I will continue to assess.

## 2018-10-28 NOTE — Progress Notes (Signed)
[place] [facility] [place] NAME: Veronica Wang    MR#:  [id number]  DATE OF BIRTH:  Jul 27, 1935  SUBJECTIVE:  Shortness of breath is improving.  Patient has intermittent episodes of atrial fibrillation with RVR Requesting melatonin to sleep which she takes at home   REVIEW OF SYSTEMS:    Review of Systems  Constitutional: Negative for fever, chills weight loss HENT: Negative for ear pain, nosebleeds, congestion, facial swelling, rhinorrhea, neck pain, neck stiffness and ear discharge.   Respiratory: Negative for cough, ++IMPROVED shortness of breath, NO wheezing  Cardiovascular: Negative for chest pain, palpitations and leg swelling.  Gastrointestinal: Negative for heartburn, abdominal pain, vomiting, diarrhea or consitpation Genitourinary: Negative for dysuria, urgency, frequency, hematuria Musculoskeletal: Negative for back pain or joint pain Neurological: Negative for dizziness, seizures, syncope, focal weakness,  numbness and headaches.  Hematological: Does not bruise/bleed easily.  Psychiatric/Behavioral: Negative for hallucinations, confusion, dysphoric mood    Tolerating Diet: yes      DRUG ALLERGIES:   Allergies  Allergen Reactions  . No Known Allergies     VITALS:  Blood pressure 126/70, pulse 77, temperature 97.9 F (36.6 C), temperature source Oral, resp. rate 20, height 5\' 2"  (1.575 m), weight 29.8 kg, SpO2 100 %.  PHYSICAL EXAMINATION:  Constitutional: Appears thin frail. No distress. HENT: Normocephalic. Marland Kitchen Oropharynx is clear and moist.  Eyes: Conjunctivae and EOM are normal. PERRLA, no scleral icterus.  Neck: Normal ROM. Neck supple. No JVD. No tracheal deviation. CVS: RRR, S1/S2 +, 3/6 murmurs, no gallops, no carotid bruit.  Pulmonary: Effort and breath sounds normal,crackles/rales at bases decreased breath sound left base  Abdominal: Soft. BS +,  no distension, tenderness, rebound or guarding.  Musculoskeletal:  Normal range of motion. No edema and no tenderness.  Neuro: Alert. CN 2-12 grossly intact. No focal deficits. Skin: Skin is warm and dry. No rash noted. Psychiatric: Normal mood and affect.      LABORATORY PANEL:   CBC Recent Labs  Lab 10/26/18 1525  WBC 9.0  HGB 12.6  HCT 39.6  PLT 247   ------------------------------------------------------------------------------------------------------------------  Chemistries  Recent Labs  Lab 10/26/18 1525  10/28/18 0459  NA 141   < > 142  K 3.4*   < > 4.0  CL 102   < > 99  CO2 31   < > 34*  GLUCOSE 110*   < > 95  BUN 16   < > 22  CREATININE 0.52   < > 0.61  CALCIUM 8.4*   < > 8.8*  AST 36  --   --   ALT 30  --   --   ALKPHOS 141*  --   --   BILITOT 0.4  --   --    < > = values in this interval not displayed.   ------------------------------------------------------------------------------------------------------------------  Cardiac Enzymes Recent Labs  Lab 10/26/18 1525  TROPONINI 0.03*   ------------------------------------------------------------------------------------------------------------------  RADIOLOGY:  Dg Chest 1 View  Result Date: 10/28/2018 CLINICAL DATA:  83 year old female with shortness of breath. EXAM: CHEST  1 VIEW COMPARISON:  10/26/2018 and earlier. FINDINGS: Portable AP upright view at 0502 hours. Persistent left pleural effusion with associated lung base opacity. Smaller right pleural effusion also again suspected. Decreased bilateral pulmonary interstitial opacity since 10/26/2018. Mild indistinct asymmetric peripheral right upper lung opacity. No pneumothorax. Stable cardiomegaly and mediastinal contours. Paucity of bowel gas in the upper abdomen. No acute osseous abnormality identified. IMPRESSION: Suspect regressed pulmonary interstitial edema  since 10/26/2018 with persistent small to moderate left and small right pleural effusions. However, there is also mild indistinct right upper lung opacity.  Respiratory infection is difficult to exclude. Electronically Signed   By: Genevie Ann M.D.   On: 10/28/2018 06:51   Dg Chest Port 1 View  Result Date: 10/26/2018 CLINICAL DATA:  Smoker and chronic shortness of breath. EXAM: PORTABLE CHEST 1 VIEW COMPARISON:  07/18/2018 and prior radiograph FINDINGS: Cardiomegaly noted. New bilateral interstitial opacities are noted suspicious for interstitial edema. Small bilateral pleural effusions, LEFT-greater-than-RIGHT, noted. LEFT LOWER lung consolidation/atelectasis identified. No pneumothorax or acute bony abnormality. IMPRESSION: 1. New bilateral interstitial opacities likely representing pulmonary edema. 2. New small bilateral pleural effusions, LEFT-greater-than-RIGHT, with LEFT LOWER lung consolidation/atelectasis. Electronically Signed   By: Margarette Canada M.D.   On: 10/26/2018 15:53     ASSESSMENT AND PLAN:   83 year old female with a history of PAF, chronic systolic heart failure ejection fraction of 45% and COPD presented to emergency room due to shortness of breath.   1.  Acute hypoxic respiratory failure in the setting of acute on chronic systolic heart failure/intermittent episodes of A. fib with RVR:  Patient has been weaned to room air. Patient was tested for COVID-19 and she was negative IV Cardizem as needed  2.  Acute on chronic systolic heart failure ejection fraction 40 to 45%: Continue IV Lasix with potassium supplements as needed Monitor intake and output  Would aim for approximately 2 to 3 L negative. Follow-up on today's echo [facility] clinic cardiology consultation requested by admitting MD.  Raeanne Gathers text message sent today to [place] and [place] aware of the consult CHF clinic upon discharge Continue lisinopril Consider adding beta-blocker once acute CHF resolves. Plan to repeat chest x-ray tomorrow to evaluate bilateral pleural effusions after diuresis.   3.  PAF RVR: Patient would benefit from beta-blocker for better heart rate  control. Cardizem boluses as needed .we will start Cardizem drip if RVR pattern is persistent  continue Pradaxa for stroke prevention  4.  Hypokalemia: Repleted.  Potassium at 4  5.  Hyperlipidemia: Continue statin  6.  COPD without signs of exacerbation  7.  Murmur: Follow-up on echo which is pending  8.  Insomnia melatonin  Physical therapy for discharge planning-recommending home health PT  Management plans discussed with the patient and she is in agreement.  CODE STATUS: DNR  TOTAL TIME TAKING CARE OF THIS PATIENT: 35 minutes.     POSSIBLE D/C 1-2 days, DEPENDING ON CLINICAL CONDITION.   Nicholes Mango M.D on 10/28/2018 at 12:48 PM  Between 7am to 6pm - Pager - [phone] After 6pm go to [email or web address] - password [email or web address] [facility]  Office  [phone]  CC: Primary care physician; Adin Hector, MD  Note: This dictation was prepared with Dragon dictation along with smaller phrase technology. Any transcriptional errors that result from this process are unintentional.

## 2018-10-28 NOTE — TOC Initial Note (Signed)
Transition of Care [facility]) - Initial/Assessment Note    Patient Details  Name: Veronica Wang MRN: [id number] Date of Birth: 02-12-1936  Transition of Care [facility]) CM/SW Contact:    Elza Rafter, RN Phone Number: 10/28/2018, 12:08 PM  Clinical Narrative:          Patient is from home with daughter.  Admitted with acute on chronic CHF.  Bilateral lower extremity and belly swelling; SOB.  Uses oxygen through [employer].  Current on 2L [place].  Current with Dr. Lequita Halt as PCP.  Obtains medications at [facility] in [place] without difficulty.  Chronically take Eliquis.  Offered home health services; patient does not have a preference-referral to [place] with [facility] for RN, PT, aide and ReDS Vest.          Expected Discharge Plan: [place] Barriers to Discharge: Continued Medical Work up   Patient Goals and CMS Choice Patient states their goals for this hospitalization and ongoing recovery are:: discharge home with daughter and use home health CMS Medicare.gov Compare Post Acute Care list provided to:: Patient Choice offered to / list presented to : Patient  Expected Discharge Plan and Services Expected Discharge Plan: [place]   Discharge Planning Services: CM Consult, HF Clinic Post Acute Care Choice: [place] arrangements for the past 2 months: Single Family Home                     HH Arranged: RN, PT, Nurse's Aide [place] Agency: [facility] ([facility])  Prior Living Arrangements/Services Living arrangements for the past 2 months: [place] Lives with:: Adult Children Patient language and need for interpreter reviewed:: Yes Do you feel safe going back to the place where you live?: Yes      Need for Family Participation in Patient Care: Yes (Comment)(daughter) Care giver support system in place?: Yes (comment)(daughter) Current home services: DME(walker and cane at home; Oxygen prn) Criminal Activity/Legal  Involvement Pertinent to Current Situation/Hospitalization: No - Comment as needed  Activities of Daily Living Home Assistive Devices/Equipment: Dentures (specify type)(upper/lower dentures) ADL Screening (condition at time of admission) Patient's cognitive ability adequate to safely complete daily activities?: Yes Is the patient deaf or have difficulty hearing?: No Does the patient have difficulty seeing, even when wearing glasses/contacts?: No Does the patient have difficulty concentrating, remembering, or making decisions?: No Patient able to express need for assistance with ADLs?: Yes Does the patient have difficulty dressing or bathing?: No Independently performs ADLs?: Yes (appropriate for developmental age) Does the patient have difficulty walking or climbing stairs?: No Weakness of Legs: None Weakness of Arms/Hands: None  Permission Sought/Granted Permission sought to share information with : Facility Art therapist granted to share information with : Yes, Verbal Permission Granted     Permission granted to share info w AGENCY: [facility]        Emotional Assessment Appearance:: Appears stated age Attitude/Demeanor/Rapport: Gracious Affect (typically observed): Accepting Orientation: : Oriented to Self, Oriented to Place, Oriented to  Time, Oriented to Situation Alcohol / Substance Use: Not Applicable    Admission diagnosis:  Chronic obstructive pulmonary disease, unspecified COPD type ([place]) [Z36.6] Systolic congestive heart failure, unspecified HF chronicity ([place]) [I50.20] Patient Active Problem List   Diagnosis Date Noted  . Acute exacerbation of CHF (congestive heart failure) ([place]) 10/26/2018  . Protein-calorie malnutrition, severe 08/14/2018  . Stroke ([place]) 08/13/2018  . HTN (hypertension) 08/13/2018  .  HLD (hyperlipidemia) 08/13/2018  . Chronic combined systolic (congestive) and diastolic (congestive) heart failure ([place]) 11/27/2017  . PAF  (paroxysmal atrial fibrillation) ([place]) 11/27/2017  . Abnormal MRI 09/10/2015  . Malnutrition of moderate degree ([place]) 12/06/2014  . Pneumonia 12/05/2014  . COPD (chronic obstructive pulmonary disease) ([place]) 12/05/2014  . 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

## 2018-10-28 NOTE — Progress Notes (Signed)
Pt is now SR in 23s. RN rounded with MD. Orders placed for cardizem IV prn for HR >120. I will continue to assess.

## 2018-10-28 NOTE — Progress Notes (Signed)
PT Cancellation Note  Patient Details Name: Veronica Wang MRN: [id number] DOB: 1935/07/19   Cancelled Treatment:    Reason Eval/Treat Not Completed: Fatigue/lethargy limiting ability to participate Upon arrival, patient semirecumbent in bed sleeping with mouth agape, 2L O2 via [place]. Patient was able to be aroused but was very drowsy and states, "I'm really not feeling well today. I don't know what's wrong with me." PT assessed SpO2 which was at 98%, but ultimately patient declined 2/2 to her level of fatigue. PT will follow up at a later time/date.  Myles Gip PT, DPT [phone] 10/28/2018, 1:56 PM

## 2018-10-29 LAB — BASIC METABOLIC PANEL
Anion gap: 8 (ref 5–15)
BUN: 26 mg/dL — ABNORMAL HIGH (ref 8–23)
CO2: 32 mmol/L (ref 22–32)
Calcium: 9 mg/dL (ref 8.9–10.3)
Chloride: 100 mmol/L (ref 98–111)
Creatinine, Ser: 0.56 mg/dL (ref 0.44–1.00)
GFR calc Af Amer: >60 mL/min (ref >60)
GFR calc non Af Amer: >60 mL/min (ref >60)
Glucose, Bld: 122 mg/dL — ABNORMAL HIGH (ref 70–99)
Potassium: 4.2 mmol/L (ref 3.5–5.1)
Sodium: 140 mmol/L (ref 135–145)

## 2018-10-29 MED ORDER — FUROSEMIDE 20 MG PO TABS
20.0000 mg | ORAL_TABLET | Freq: Two times a day (BID) | ORAL | Status: DC
  Administered 2018-10-29 – 2018-10-30 (×3): 20 mg via ORAL
  Filled 2018-10-29 (×3): qty 1

## 2018-10-29 NOTE — Progress Notes (Signed)
[facility] Encounter Note  Patient: Veronica Wang / Admit Date: 10/26/2018 / Date of Encounter: 10/29/2018, 1:17 PM   Subjective: Slight improvements in shortness of breath weakness and fatigue.  No new changes in telemetry with normal sinus rhythm.  No evidence of myocardial infarction with continued normal troponin.  Rehab slow  Review of Systems: Positive for: Shortness of breath Negative for: Vision change, hearing change, syncope, dizziness, nausea, vomiting,diarrhea, bloody stool, stomach pain, cough, congestion, diaphoresis, urinary frequency, urinary pain,skin lesions, skin rashes Others previously listed  Objective: Telemetry: Normal sinus rhythm Physical Exam: Blood pressure 103/64, pulse 60, temperature 97.6 F (36.4 C), temperature source Oral, resp. rate 20, height 5\' 2"  (1.575 m), weight 29.5 kg, SpO2 96 %. Body mass index is 11.89 kg/m. General: Well developed, well nourished, in no acute distress. Head: Normocephalic, atraumatic, sclera non-icteric, no xanthomas, nares are without discharge. Neck: No apparent masses Lungs: Normal respirations with no wheezes, no rhonchi, no rales , no crackles   Heart: Regular rate and rhythm, normal S1 S2, no murmur, no rub, no gallop, PMI is normal size and placement, carotid upstroke normal without bruit, jugular venous pressure normal Abdomen: Soft, non-tender, non-distended with normoactive bowel sounds. No hepatosplenomegaly. Abdominal aorta is normal size without bruit Extremities: No edema, no clubbing, no cyanosis, no ulcers,  Peripheral: 2+ radial, 2+ femoral, 2+ dorsal pedal pulses Neuro: Alert and oriented. Moves all extremities spontaneously. Psych:  Responds to questions appropriately with a normal affect.   Intake/Output Summary (Last 24 hours) at 10/29/2018 1317 Last data filed at 10/29/2018 1227 Gross per 24 hour  Intake 1560 ml  Output 0 ml  Net 1560 ml    Inpatient Medications:  .  atorvastatin  40 mg Oral q1800  . cholecalciferol  1,000 Units Oral Daily  . dabigatran  75 mg Oral BID  . feeding supplement (ENSURE ENLIVE)  237 mL Oral BID BM  . furosemide  20 mg Oral BID  . lisinopril  5 mg Oral Daily  . mouth rinse  15 mL Mouth Rinse BID  . Melatonin  5 mg Oral QHS  . metoprolol tartrate  25 mg Oral BID  . multivitamin with minerals  1 tablet Oral Daily  . potassium chloride  40 mEq Oral Daily  . sodium chloride flush  3 mL Intravenous Q12H  . Telotristat Ethyl(as Etiprate)  1 tablet Oral TID  . vitamin B-12  1,000 mcg Oral Daily   Infusions:  . sodium chloride      Labs: Recent Labs    10/28/18 0459 10/29/18 0433  NA 142 140  K 4.0 4.2  CL 99 100  CO2 34* 32  GLUCOSE 95 122*  BUN 22 26*  CREATININE 0.61 0.56  CALCIUM 8.8* 9.0   Recent Labs    10/26/18 1525  AST 36  ALT 30  ALKPHOS 141*  BILITOT 0.4  PROT 6.2*  ALBUMIN 3.4*   Recent Labs    10/26/18 1525  WBC 9.0  HGB 12.6  HCT 39.6  MCV 83.4  PLT 247   Recent Labs    10/26/18 1525  TROPONINI 0.03*   Invalid input(s): POCBNP No results for input(s): HGBA1C in the last 72 hours.   Weights: Filed Weights   10/27/18 0432 10/28/18 0520 10/29/18 0523  Weight: 31.3 kg 29.8 kg 29.5 kg     Radiology/Studies:  Dg Chest 1 View  Result Date: 10/28/2018 CLINICAL DATA:  83 year old female with shortness of breath. EXAM: CHEST  1 VIEW COMPARISON:  10/26/2018 and earlier. FINDINGS: Portable AP upright view at 0502 hours. Persistent left pleural effusion with associated lung base opacity. Smaller right pleural effusion also again suspected. Decreased bilateral pulmonary interstitial opacity since 10/26/2018. Mild indistinct asymmetric peripheral right upper lung opacity. No pneumothorax. Stable cardiomegaly and mediastinal contours. Paucity of bowel gas in the upper abdomen. No acute osseous abnormality identified. IMPRESSION: Suspect regressed pulmonary interstitial edema since 10/26/2018  with persistent small to moderate left and small right pleural effusions. However, there is also mild indistinct right upper lung opacity. Respiratory infection is difficult to exclude. Electronically Signed   By: Genevie Ann M.D.   On: 10/28/2018 06:51   Dg Chest Port 1 View  Result Date: 10/26/2018 CLINICAL DATA:  Smoker and chronic shortness of breath. EXAM: PORTABLE CHEST 1 VIEW COMPARISON:  07/18/2018 and prior radiograph FINDINGS: Cardiomegaly noted. New bilateral interstitial opacities are noted suspicious for interstitial edema. Small bilateral pleural effusions, LEFT-greater-than-RIGHT, noted. LEFT LOWER lung consolidation/atelectasis identified. No pneumothorax or acute bony abnormality. IMPRESSION: 1. New bilateral interstitial opacities likely representing pulmonary edema. 2. New small bilateral pleural effusions, LEFT-greater-than-RIGHT, with LEFT LOWER lung consolidation/atelectasis. Electronically Signed   By: Margarette Canada M.D.   On: 10/26/2018 15:53     Assessment and Recommendation  83 y.o. female with acute on chronic systolic dysfunction congestive heart failure with edema and pleural effusion slightly improved with continued mild shortness of breath and no current evidence of myocardial infarction 1.  Continuation of metoprolol lisinopril medication management for hypertension control and maintenance of normal sinus rhythm and treatment of cardiomyopathy 2.  Pradaxa for further risk reduction in stroke with atrial fibrillation 3.  No further cardiac diagnostics necessary at this time 4.  Begin ambulation and physical therapy to assess for need for adjustments of medication management 5.  Further adjustments in treatment options as outpatient  Signed, Serafina Royals M.D. FACC

## 2018-10-29 NOTE — Care Management Important Message (Signed)
Important Message  Patient Details  Name: Veronica Wang MRN: [id number] Date of Birth: 30-Oct-1935   Medicare Important Message Given:  Yes    Dannette Barbara 10/29/2018, 11:07 AM

## 2018-10-29 NOTE — Progress Notes (Signed)
Ch visited pt who had an OR related to pt feeling isolated and alone. PT was working w/ pt to get her up and moving in the room w/o exhausting pt's O2 levels. Ch stated that she would f/u w/ pt at a later time.    10/29/18 1300  Clinical Encounter Type  Visited With Patient;Health care provider  Visit Type Psychological support;Spiritual support;Social support  Referral From Physician  Consult/Referral To Chaplain  Spiritual Encounters  Spiritual Needs Emotional;Grief support  Stress Factors  Patient Stress Factors Major life changes;Health changes  Family Stress Factors None identified

## 2018-10-29 NOTE — Progress Notes (Signed)
Chaplain received OR to visit patient. Chaplain arrived and patient was asleep. Chaplain will pass on to on call  Chaplain to try again later, if she cannot visit before shift ends.

## 2018-10-29 NOTE — Progress Notes (Signed)
[facility] [place] NAME: Veronica Wang    MR#:  [id number]  DATE OF BIRTH:  [birth year]  SUBJECTIVE:  Shortness of breath is better and heart rate improved reporting significant weakness  Melatonin is helping with her sleep   REVIEW OF SYSTEMS:    Review of Systems  Constitutional: Negative for fever, chills weight loss HENT: Negative for ear pain, nosebleeds, congestion, facial swelling, rhinorrhea, neck pain, neck stiffness and ear discharge.   Respiratory: Negative for cough, ++IMPROVED shortness of breath, NO wheezing  Cardiovascular: Negative for chest pain, palpitations and leg swelling.  Gastrointestinal: Negative for heartburn, abdominal pain, vomiting, diarrhea or consitpation Genitourinary: Negative for dysuria, urgency, frequency, hematuria Musculoskeletal: Negative for back pain or joint pain Neurological: Negative for dizziness, seizures, syncope, focal weakness,  numbness and headaches.  Hematological: Does not bruise/bleed easily.  Psychiatric/Behavioral: Negative for hallucinations, confusion, dysphoric mood    Tolerating Diet: yes      DRUG ALLERGIES:   Allergies  Allergen Reactions  . No Known Allergies     VITALS:  Blood pressure 103/64, pulse 60, temperature 97.6 F (36.4 C), temperature source Oral, resp. rate 20, height 5\' 2"  (1.575 m), weight 29.5 kg, SpO2 96 %.  PHYSICAL EXAMINATION:  Constitutional: Appears thin frail. No distress. HENT: Normocephalic. Marland Kitchen Oropharynx is clear and moist.  Eyes: Conjunctivae and EOM are normal. PERRLA, no scleral icterus.  Neck: Normal ROM. Neck supple. No JVD. No tracheal deviation. CVS: RRR, S1/S2 +, 3/6 murmurs, no gallops, no carotid bruit.  Pulmonary: Effort and breath sounds normal,crackles/rales at bases decreased breath sound left base  Abdominal: Soft. BS +,  no distension, tenderness, rebound or guarding.  Musculoskeletal: Normal range of motion. No edema and no  tenderness.  Neuro: Alert. CN 2-12 grossly intact. No focal deficits. Skin: Skin is warm and dry. No rash noted. Psychiatric: Normal mood and affect.      LABORATORY PANEL:   CBC Recent Labs  Lab 10/26/18 1525  WBC 9.0  HGB 12.6  HCT 39.6  PLT 247   ------------------------------------------------------------------------------------------------------------------  Chemistries  Recent Labs  Lab 10/26/18 1525  10/29/18 0433  NA 141   < > 140  K 3.4*   < > 4.2  CL 102   < > 100  CO2 31   < > 32  GLUCOSE 110*   < > 122*  BUN 16   < > 26*  CREATININE 0.52   < > 0.56  CALCIUM 8.4*   < > 9.0  AST 36  --   --   ALT 30  --   --   ALKPHOS 141*  --   --   BILITOT 0.4  --   --    < > = values in this interval not displayed.   ------------------------------------------------------------------------------------------------------------------  Cardiac Enzymes Recent Labs  Lab 10/26/18 1525  TROPONINI 0.03*   ------------------------------------------------------------------------------------------------------------------  RADIOLOGY:  Dg Chest 1 View  Result Date: 10/28/2018 CLINICAL DATA:  83 year old female with shortness of breath. EXAM: CHEST  1 VIEW COMPARISON:  10/26/2018 and earlier. FINDINGS: Portable AP upright view at 0502 hours. Persistent left pleural effusion with associated lung base opacity. Smaller right pleural effusion also again suspected. Decreased bilateral pulmonary interstitial opacity since 10/26/2018. Mild indistinct asymmetric peripheral right upper lung opacity. No pneumothorax. Stable cardiomegaly and mediastinal contours. Paucity of bowel gas in the upper abdomen. No acute osseous abnormality identified. IMPRESSION: Suspect regressed pulmonary interstitial edema since 10/26/2018 with persistent small  to moderate left and small right pleural effusions. However, there is also mild indistinct right upper lung opacity. Respiratory infection is difficult to  exclude. Electronically Signed   By: Genevie Ann M.D.   On: 10/28/2018 06:51     ASSESSMENT AND PLAN:   83 year old female with a history of PAF, chronic systolic heart failure ejection fraction of 45% and COPD presented to emergency room due to shortness of breath.   1.  Acute hypoxic respiratory failure in the setting of acute on chronic systolic heart failure/intermittent episodes of A. fib with RVR:  Patient has been weaned to room air.  Clinically improving Patient was tested for COVID-19 and she was negative IV Cardizem as needed No cardiac interventions needed as per Dr. Nehemiah Massed  2.  Acute on chronic severe systolic heart failure -EF 20 to 25% Continue Lasix with potassium supplements as needed Monitor intake and output   Echocardiogram with 20 to 25% EF Cardiology Dr. Nehemiah Massed is following CHF clinic upon discharge Continue lisinopril Consider adding beta-blocker once acute CHF resolves.  repeat chest x-ray with the regulation of pulmonary interstitial edema  after diuresis.   3.  PAF RVR: Patient would benefit from beta-blocker for better heart rate control. Cardizem boluses as needed  Weaned off  Cardizem drip  continue Pradaxa for stroke prevention  4.  Hypokalemia: Repleted.  Potassium at 4.2   5.  Hyperlipidemia: Continue statin  6.  COPD without signs of exacerbation  7.  Murmur: Follow-up on echo which is pending  8.  Insomnia melatonin  Physical therapy for discharge planning-recommending home health PT  Management plans discussed with the patient and she is in agreement.  CODE STATUS: DNR  TOTAL TIME TAKING CARE OF THIS PATIENT: 29 minutes.     POSSIBLE D/C 1- days, DEPENDING ON CLINICAL CONDITION.   Nicholes Mango M.D on 10/29/2018 at 1:22 PM  Between 7am to 6pm - Pager - [phone] After 6pm go to [email or web address] - password [email or web address] [facility]  Office  [phone]  CC: Primary care physician; Adin Hector, MD  Note:  This dictation was prepared with Dragon dictation along with smaller phrase technology. Any transcriptional errors that result from this process are unintentional.

## 2018-10-29 NOTE — Progress Notes (Signed)
SATURATION QUALIFICATIONS: (This note is used to comply with regulatory documentation for home oxygen)  Patient Saturations on Room Air at Rest = 86%  Patient Saturations on Room Air while Ambulating = n/a%  Patient Saturations on n/a Liters of oxygen while Ambulating = n/a%  Please briefly explain why patient needs home oxygen:

## 2018-10-29 NOTE — Progress Notes (Signed)
[place] FAILURE PHARMACIST COUNSELING NOTE  ADHERENCE ASSESSMENT Patient states she uses a pill box to keep track of her medications. She stated she does not frequently forget taking her medications. She mentioned some confusion with direction whether to take or not take her Lasix. One doctor told her to stop taking it and another doctor told her to continue taking it. She stated she stopped taking it and then starting taking it again but only one Lasix tablet per day. It was encouraged to communicate with her doctors about what the other is recommending, so everybody is on the same page. It was also encouraged to take her medications everyday and to weight herself daily.     Guideline-Directed Medical Therapy/Evidence Based Medicine  ACE/ARB/ARNI: Lisinopril 5 mg daily  Beta Blocker: metoprolol tartrate 25 mg BID Aldosterone Antagonist: None Diuretic: Furosemide 20 mg BID   SUBJECTIVE  HPI: Veronica Wang  is a 83 y.o. female with a known history of atrial fibrillation, hepatocellular carcinoma, chronic congestive heart failure, COPD, hyperlipidemia, hypertension presented to the emergency room for shortness of breath.  Patient has orthopnea.  No complaints of cough, fever and chills.  She was tested for coronavirus and is negative.  BNP is elevated and chest x-ray shows pulmonary edema and fluid overload.  Patient was given IV Lasix for diuresis.  Past Medical History:  Diagnosis Date  . Abnormal MRI 09/10/2015  . Atrial fibrillation ([place])   . Cancer ([place])    Liver  . Cancer ([place])    stomach  . Carcinoid tumor of intestine   . CHF (congestive heart failure) ([place])   . COPD (chronic obstructive pulmonary disease) ([place])   . FH: chemotherapy   . GERD (gastroesophageal reflux disease)   . Hyperlipidemia   . Hypertension   . Macular degeneration   . Polycythemia   . Radiation      OBJECTIVE    Vital signs: HR 60-70, BP 100-110/60-70, weight (pounds) 65  lb  ECHO: Date 10/27/2018, EF 20-25%,   BMP Latest Ref Rng & Units 10/29/2018 10/28/2018 10/27/2018  Glucose 70 - 99 mg/dL 122(H) 95 88  BUN 8 - 23 mg/dL 26(H) 22 19  Creatinine 0.44 - 1.00 mg/dL 0.56 0.61 0.50  Sodium 135 - 145 mmol/L 140 142 141  Potassium 3.5 - 5.1 mmol/L 4.2 4.0 3.4(L)  Chloride 98 - 111 mmol/L 100 99 98  CO2 22 - 32 mmol/L 32 34(H) 34(H)  Calcium 8.9 - 10.3 mg/dL 9.0 8.8(L) 8.7(L)    ASSESSMENT Shortness of breath improved. Patient was not taking her Lasix appropriately and was became fluid overloaded. Pt was also in afib and was started on metoprolol tartrate. Patient also on KCl 40 mEq daily. Creatinine at baseline.   PLAN Continue current medications. Monitor patient's potassium regularly. Potentially add spironolactone if blood pressure allows. Could switch to metoprolol succinate to help with medication adherence (once daily vs twice daily). Patient agrees to weighting herself daily and keep follow up with heart failure clinic. Patient was counseled on adverse effects, and how to appropriately take her mediations. The importance of adherence was stated.     Time spent: 25 minutes  Oswald Hillock, Pharm.D., BCPS Clinical Pharmacist 10/29/2018 1:36 PM    Current Facility-Administered Medications:  .  0.9 %  sodium chloride infusion, 250 mL, Intravenous, PRN, Pyreddy, Pavan, MD .  acetaminophen (TYLENOL) tablet 650 mg, 650 mg, Oral, Q4H PRN, Pyreddy, Pavan, MD .  albuterol (PROVENTIL) (2.5 MG/3ML) 0.083%  nebulizer solution 2.5 mg, 2.5 mg, Inhalation, Q6H PRN, Pyreddy, Pavan, MD .  atorvastatin (LIPITOR) tablet 40 mg, 40 mg, Oral, q1800, Pyreddy, Pavan, MD, 40 mg at 10/28/18 1748 .  cholecalciferol (VITAMIN D) tablet 1,000 Units, 1,000 Units, Oral, Daily, Pyreddy, Pavan, MD, 1,000 Units at 10/29/18 0841 .  dabigatran (PRADAXA) capsule 75 mg, 75 mg, Oral, BID, Pyreddy, Pavan, MD, 75 mg at 10/29/18 0840 .  diltiazem (CARDIZEM) injection 5 mg, 5 mg, Intravenous,  Q6H PRN, Gouru, Aruna, MD .  famotidine (PEPCID) tablet 20 mg, 20 mg, Oral, Daily PRN, Pyreddy, Pavan, MD .  feeding supplement (ENSURE ENLIVE) (ENSURE ENLIVE) liquid 237 mL, 237 mL, Oral, BID BM, Gouru, Aruna, MD, 237 mL at 10/29/18 0846 .  furosemide (LASIX) tablet 20 mg, 20 mg, Oral, BID, Gouru, Aruna, MD, 20 mg at 10/29/18 0845 .  lisinopril (ZESTRIL) tablet 5 mg, 5 mg, Oral, Daily, Pyreddy, Pavan, MD, 5 mg at 10/29/18 0840 .  MEDLINE mouth rinse, 15 mL, Mouth Rinse, BID, Pyreddy, Pavan, MD, 15 mL at 10/29/18 0841 .  Melatonin TABS 5 mg, 5 mg, Oral, QHS, Gouru, Aruna, MD, 5 mg at 10/28/18 2110 .  metoprolol tartrate (LOPRESSOR) tablet 25 mg, 25 mg, Oral, BID, Corey Skains, MD, 25 mg at 10/29/18 0840 .  multivitamin with minerals tablet 1 tablet, 1 tablet, Oral, Daily, Pyreddy, Pavan, MD, 1 tablet at 10/29/18 0840 .  ondansetron (ZOFRAN) injection 4 mg, 4 mg, Intravenous, Q6H PRN, Pyreddy, Pavan, MD .  potassium chloride (K-DUR) CR tablet 10 mEq, 10 mEq, Oral, Daily PRN, Pyreddy, Pavan, MD, 10 mEq at 10/27/18 0911 .  potassium chloride 20 MEQ/15ML (10%) solution 40 mEq, 40 mEq, Oral, Daily, Mody, Sital, MD, 40 mEq at 10/29/18 0841 .  sodium chloride flush (NS) 0.9 % injection 3 mL, 3 mL, Intravenous, Q12H, Pyreddy, Pavan, MD, 3 mL at 10/29/18 0843 .  sodium chloride flush (NS) 0.9 % injection 3 mL, 3 mL, Intravenous, PRN, Saundra Shelling, MD .  Telotristat Ethyl(as Etiprate) TABS 1 tablet, 1 tablet, Oral, TID, Lance Coon, MD, 1 tablet at 10/29/18 0845 .  vitamin B-12 (CYANOCOBALAMIN) tablet 1,000 mcg, 1,000 mcg, Oral, Daily, Pyreddy, Pavan, MD, 1,000 mcg at 10/29/18 0841  Facility-Administered Medications Ordered in Other Encounters:  .  0.9 %  sodium chloride infusion, , Intravenous, Continuous, Burns, Wandra Feinstein, NP, Stopped at 09/17/17 1630   COUNSELING POINTS/CLINICAL PEARLS Metoprolol Tartrate: (Goal: 200 mg/day) Warn patient to avoid activities requiring mental alertness or  coordination until drug effects are realized, as drug may cause dizziness. Tell patient planning major surgery with anesthesia to alert physician that drug is being used, as drug impairs ability of heart to respond to reflex adrenergic stimuli. Drug may cause diarrhea, fatigue, headache, or depression. Advise diabetic patient to carefully monitor blood glucose as drug may mask symptoms of hypoglycemia. Patient should take extended-release tablet with or immediately following meals. Counsel patient against sudden discontinuation of drug, as this may precipitate hypertension, angina, or myocardial infarction. In the event of a missed dose, counsel patient to skip the missed dose and maintain a regular dosing schedule. Lisinopril (Goal: 20 to 40 mg once daily)  This drug may cause nausea, vomiting, dizziness, headache, or  angioedema of face, lips, throat, or intestines.  Instruct patient to report signs/symptoms of hypotension, or a persistent  cough.  Advise patient against sudden discontinuation of drug. Furosemide  Drug causes sun-sensitivity. Advise patient to use sunscreen and avoid  tanning beds. Patient should avoid activities  requiring coordination until drug effects are realized, as drug may cause dizziness, vertigo, or blurred vision. This drug may cause hyperglycemia, hyperuricemia, constipation, diarrhea, loss of appetite, nausea, vomiting, purpuric disorder, cramps, spasticity, asthenia, headache, paresthesia, or scaling eczema. Instruct patient to report unusual bleeding/bruising or signs/symptoms of hypotension, infection, pancreatitis, or ototoxicity (tinnitus, hearing impairment). Advise patient to report signs/symptoms of a severe skin reactions (flu-like symptoms, spreading red rash, or skin/mucous membrane blistering) or erythema multiforme. Instruct patient to eat high-potassium foods during drug therapy, as  directed by healthcare professional.  Patient should not drink alcohol  while taking this drug.   DRUGS TO AVOID IN HEART FAILURE  Drug or Class Mechanism  Analgesics . NSAIDs . COX-2 inhibitors . Glucocorticoids  Sodium and water retention, increased systemic vascular resistance, decreased response to diuretics   Diabetes Medications . Metformin . Thiazolidinediones o Rosiglitazone (Avandia) o Pioglitazone (Actos) . DPP4 Inhibitors o Saxagliptin (Onglyza) o Sitagliptin (Januvia)   Lactic acidosis Possible calcium channel blockade   Unknown  Antiarrhythmics . Class I  o Flecainide o Disopyramide . Class III o Sotalol . Other o Dronedarone  Negative inotrope, proarrhythmic   Proarrhythmic, beta blockade  Negative inotrope  Antihypertensives . Alpha Blockers o Doxazosin . Calcium Channel Blockers o Diltiazem o Verapamil o Nifedipine . Central Alpha Adrenergics o Moxonidine . Peripheral Vasodilators o Minoxidil  Increases renin and aldosterone  Negative inotrope    Possible sympathetic withdrawal  Unknown  Anti-infective . Itraconazole . Amphotericin B  Negative inotrope Unknown  Hematologic . Anagrelide . Cilostazol   Possible inhibition of PD IV Inhibition of PD III causing arrhythmias  Neurologic/Psychiatric . Stimulants . Anti-Seizure Drugs o Carbamazepine o Pregabalin . Antidepressants o Tricyclics o Citalopram . Parkinsons o Bromocriptine o Pergolide o Pramipexole . Antipsychotics o Clozapine . Antimigraine o Ergotamine o Methysergide . Appetite suppressants . Bipolar o Lithium  Peripheral alpha and beta agonist activity  Negative inotrope and chronotrope Calcium channel blockade  Negative inotrope, proarrhythmic Dose-dependent QT prolongation  Excessive serotonin activity/valvular damage Excessive serotonin activity/valvular damage Unknown  IgE mediated hypersensitivy, calcium channel blockade  Excessive serotonin activity/valvular damage Excessive serotonin activity/valvular  damage Valvular damage  Direct myofibrillar degeneration, adrenergic stimulation  Antimalarials . Chloroquine . Hydroxychloroquine Intracellular inhibition of lysosomal enzymes  Urologic Agents . Alpha Blockers o Doxazosin o Prazosin o Tamsulosin o Terazosin  Increased renin and aldosterone  Adapted from Page RL, et al. "Drugs That May Cause or Exacerbate Heart Failure: A Scientific Statement from the [place]." Circulation 2016; 761:Y07-P71. DOI: 10.1161/CIR.0000000000000426   MEDICATION ADHERENCES TIPS AND STRATEGIES 1. Taking medication as prescribed improves patient outcomes in heart failure (reduces hospitalizations, improves symptoms, increases survival) 2. Side effects of medications can be managed by decreasing doses, switching agents, stopping drugs, or adding additional therapy. Please let someone in the [facility] know if you have having bothersome side effects so we can modify your regimen. Do not alter your medication regimen without talking to [name].  3. Medication reminders can help patients remember to take drugs on time. If you are missing or forgetting doses you can try linking behaviors, using pill boxes, or an electronic reminder like an alarm on your phone or an app. Some people can also get automated phone calls as medication reminders.

## 2018-10-29 NOTE — Progress Notes (Signed)
Physical Therapy Treatment Patient Details Name: Veronica Wang MRN: [id number] DOB: 08-03-1935 Today's Date: 10/29/2018    History of Present Illness Patient is an 83 year old female with known history of atrial fibrillation, hepatocellular carcinoma, chronic congestive heart failure, COPD, hyperlipidemia, hypertension presented to the emergency room for shortness of breath.    PT Comments    Patient in bed speaking with pharmacist on PT arrival. Patient reports feeling better than previous day and is amenable to attempt therapy though she notes that she is unable to maintain her SpO2 levels today. Patient performed bed mobility and sit to stand transfers independently on 1L O2 via [place]; SpO2 was 93% prior to mobility. Patient able to ambulate to bathroom for toileting Mod I was IND with all tasks involved in toileting. Upon exiting the bathroom, patient noted to have unsteadiness and provided with CGA to return to bed. SpO2 after ambulating in room was 71%; with 4 minutes seated rest SpO2 elevated to 94% on 1 L via [place]. PT and patient agreed to discontinue therapy for today secondary to patient's inability to maintain appropriate SpO2 levels. Patient will benefit from skilled therapeutic services to address deficits in strength, mobility, and activity tolerance in order to improve overall QOL.    Follow Up Recommendations  Home health PT;Supervision - Intermittent     Equipment Recommendations  None recommended by PT    Recommendations for Other Services       Precautions / Restrictions Precautions Precautions: Fall Restrictions Weight Bearing Restrictions: No    Mobility  Bed Mobility Overal bed mobility: Independent                Transfers Overall transfer level: Independent                  Ambulation/Gait Ambulation/Gait assistance: Min guard;Modified independent (Device/Increase time) Gait Distance (Feet): 50 Feet Assistive device: None Gait  Pattern/deviations: WFL(Within Functional Limits)     General Gait Details: Patient ambulated to bathroom and back Mod I (increased time 2/2 to fatigue), however after toileting patient was noticeably unsteady and required CGA to return to bed. Patient was able to recover balance with external support.    Stairs             Wheelchair Mobility    Modified Rankin (Stroke Patients Only)       Balance                                            Cognition Arousal/Alertness: Awake/alert Behavior During Therapy: WFL for tasks assessed/performed Overall Cognitive Status: Within Functional Limits for tasks assessed                                        Exercises Other Exercises Other Exercises: Functional transfers: bed mobility and toilet transfers, Mod I. SpO2 pre: 93%; post: 71% (on 1 L O2 via )    General Comments        Pertinent Vitals/Pain Pain Assessment: No/denies pain    Home Living                      Prior Function            PT Goals (current goals can now be found in the  care plan section) Acute Rehab PT Goals Patient Stated Goal: get stronger and breathe better PT Goal Formulation: With patient Time For Goal Achievement: 11/10/18 Potential to Achieve Goals: Good Progress towards PT goals: Progressing toward goals    Frequency    Min 2X/week      PT Plan      Co-evaluation              AM-PAC PT "6 Clicks" Mobility   Outcome Measure  Help needed turning from your back to your side while in a flat bed without using bedrails?: None Help needed moving from lying on your back to sitting on the side of a flat bed without using bedrails?: None Help needed moving to and from a bed to a chair (including a wheelchair)?: None Help needed standing up from a chair using your arms (e.g., wheelchair or bedside chair)?: None Help needed to walk in hospital room?: None Help needed climbing 3-5 steps  with a railing? : None 6 Click Score: 24    End of Session Equipment Utilized During Treatment: Gait belt;Oxygen(1L O2) Activity Tolerance: Patient limited by fatigue;Treatment limited secondary to medical complications (Comment)(SpO2 dropped to 71% after toileting transfer.) Patient left: with call bell/phone within reach;in bed   PT Visit Diagnosis: Muscle weakness (generalized) (M62.81);Difficulty in walking, not elsewhere classified (R26.2)     Time: 5379-4327 PT Time Calculation (min) (ACUTE ONLY): 13 min  Charges:  $Therapeutic Activity: 8-22 mins                     Myles Gip PT, DPT [phone] 10/29/2018, 1:49 PM

## 2018-10-29 NOTE — Progress Notes (Addendum)
Cardiovascular and Pulmonary Nurse Navigator:    83 year old female with history of atrial fibrillation, hepatocellular carcinoma, chronic congestive heart failure with EF of 45%, HLD, HTN , COPD, who presented toe the ED with c/o SOB.  Patient admitted with dx of acute on chronic CHF exacerbation.  Echo performed on 10/27/2018.      ECHOCARDIOGRAM REPORT    Patient Name:   Veronica Wang Date of Exam: 10/27/2018 Medical Rec #:  [id number]         Height:       62.0 in Accession #:    [id number]        Weight:       68.9 lb Date of Birth:  1936-01-10         BSA:          1.22 m Patient Age:    25 years          BP:           127/77 mmHg Patient Gender: F                 HR:           96 bpm. Exam Location:  [facility]    Procedure: 2D Echo  Indications:     CHF-Acute Diastolic 627.03/ J00.93   History:         Patient has prior history of Echocardiogram examinations, most                  recent 10/13/2018.   Sonographer:     Arville Go RDCS Referring Phys:  [id number] [facility] PYREDDY Diagnosing Phys: Serafina Royals MD  IMPRESSIONS    1. The left ventricle has severely reduced systolic function, with an ejection fraction of 20-25%. The cavity size was severely dilated. There is mildly increased left ventricular wall thickness. Left ventricular diastolic Doppler parameters are  consistent with pseudonormalization.  2. The right ventricle has normal systolic function. The cavity was normal. There is no increase in right ventricular wall thickness.  3. Left atrial size was moderately dilated.  4. Right atrial size was mildly dilated.  5. Small pericardial effusion.  6. Mitral valve regurgitation is moderate to severe by color flow Doppler  CHF Education:?? Educational session with patient completed.  Patient had the packet "Living Better with Heart Failure" at bedside.  Heart failure is not a new diagnosis for this patient.   Briefly reviewed definition of heart failure and signs and  symptoms of an exacerbation.?Explained to patient that HF is a chronic illness which requires self-assessment / self-management along with help from the cardiologist/PCP.?? ? *Reviewed importance of and reason behind checking weight daily in the AM, after using the bathroom, but before getting dressed. Patient has functioning scale, but stopped weighing when a provider told her she no longer needed to do so.  Patient stated she will resume weighing.  Patient also stated that her symptoms were SOB, abdominal distention and bloating, and swelling of lower extremities.    ? *Reviewed with patient the following information: *Discussed when to call the Dr= weight gain of >2-3lb overnight of 5lb in a week,  *Discussed yellow zone= call MD: weight gain of >2-3lb overnight of 5lb in a week, increased swelling, increased SOB when lying down, chest discomfort, dizziness, increased fatigue *Red Zone= call 911: struggle to breath, fainting or near fainting, significant chest pain   *Diet - Patient is on a low sodium diet.  Dietitian Consultation has been  ordered and pending.  Reviewed low sodium diet-provided handout of recommended and not recommended foods. Patient explained since February patient has had a stroke, moved out of her home where she lived for  77 - 62 years, and moved in with her daughter.  Patient informed this RN that she has been eating foods that she would not normally eat if she were living alone and preparing her own foods.  Note:  Patient's weight today is 29.5 kg with BMI of 11.89.  This RN encouraged patient to drink ENSURE ENLIVE as the Dietitian had suggested.  Informed patient drinking one bottle is the equivalent to a meal and would provide more protein that one serving of meat.   ? *Discussed fluid intake with patient as well. Patient not currently on a fluid restriction, but advised no more than 64 ounces glass of fluid per day.? ? *Instructed patient to take medications as prescribed  for heart failure. Explained briefly why pt is on the medications (either make you feel better, live longer or keep you out of the hospital) and discussed monitoring and side effects.  ? *Acitivity - PT is working with patient.  Briefly talked about Cardiac / Pulmonary Rehab.  Patient is not interested at this time, as she does not drive and her daughter works, so patient would not have any one drive her to the session    ? *Smoking Cessation- Patient is a former smoker.? ? *[facility] - Patient has been followed in the [facility] in the past.  Patient stated she was told that she no longer had an issue with heart failure and did not need to return to the HF Clinic.  Explained the purpose of the HF Clinic. This RN informed her given this admission with CHF an appointment has been made for her to be seen in the [facility] on 11/04/2018 at 12:40 p.m.  Explained to patient given the COVID-19 pandemic this appointment will be a virtual (Over the Phone) appointment.    As indicated above, patient shared feelings about her home situation.  Patient tearful as she talked about the changes that have taken place in her life since February.  Patient stated, "I feel like I have been pushed out."  Patient stated she is feeling isolated and alone.  Patient apologized for "laying her troubles" on me.  Reassurance provided and encouraged patient to talk about her feelings.  Chaplain Consult ordered to provide additional support to patient.    Again, the 5 Steps to Living Better with Heart Failure were reviewed with patient.   ? Patient thanked me for providing the above information and listening. ? ? Roanna Epley, RN, BSN, [facility]? [facility]  Cardiovascular &?Pulmonary Nurse Navigator  Direct Line: [phone]  Department Phone #: [phone]  Fax: [phone]? Email Address: [email or web address]

## 2018-10-30 LAB — BASIC METABOLIC PANEL
Anion gap: 9 (ref 5–15)
BUN: 29 mg/dL — ABNORMAL HIGH (ref 8–23)
CO2: 28 mmol/L (ref 22–32)
Calcium: 8.8 mg/dL — ABNORMAL LOW (ref 8.9–10.3)
Chloride: 102 mmol/L (ref 98–111)
Creatinine, Ser: 0.67 mg/dL (ref 0.44–1.00)
GFR calc Af Amer: >60 mL/min (ref >60)
GFR calc non Af Amer: >60 mL/min (ref >60)
Glucose, Bld: 114 mg/dL — ABNORMAL HIGH (ref 70–99)
Potassium: 4.5 mmol/L (ref 3.5–5.1)
Sodium: 139 mmol/L (ref 135–145)

## 2018-10-30 MED ORDER — METOPROLOL TARTRATE 25 MG PO TABS: 25.0000 mg | ORAL_TABLET | Freq: Two times a day (BID) | ORAL | 0 refills | Status: DC

## 2018-10-30 MED ORDER — MELATONIN 5 MG PO TABS: 5.0000 mg | ORAL_TABLET | Freq: Every day | ORAL | 0 refills | Status: DC

## 2018-10-30 NOTE — Unmapped (Signed)
Pt presently in the hospital in [place] for heart failure. She has tested covid 19 negative per pt.     I gave her instructions on need for mask if she keeps appointment tomorrow and to let me know if she wants to change until Friday.

## 2018-10-30 NOTE — TOC Transition Note (Signed)
Transition of Care [facility]) - CM/SW Discharge Note   Patient Details  Name: Larrie Lucia MRN: [id number] Date of Birth: 04-Jul-1936  Transition of Care [facility] [facility]) CM/SW Contact:  Elza Rafter, RN Phone Number: 10/30/2018, 10:16 AM   Clinical Narrative:   Patient discharge order placed.  Daughter will transport to home.  Patient states she will have her portable oxygen at discharge.  Helene Kelp with [facility] is aware of discharge.  Messaged MD or home health orders RN, PT and aide.  Protocol already faxed for ReDS Vest.       Final next level of care: [place] Barriers to Discharge: No Barriers Identified   Patient Goals and CMS Choice Patient states their goals for this hospitalization and ongoing recovery are:: discharge home with daughter and use home health CMS Medicare.gov Compare Post Acute Care list provided to:: Patient Choice offered to / list presented to : Patient  Discharge Placement                       Discharge Plan and Services   Discharge Planning Services: CM Consult, HF Clinic Post Acute Care Choice: Home Health              HH Arranged: RN, PT, Nurse's Aide [place]: [facility] (now [facility])   Social Determinants of Health (SDOH) Interventions     Readmission Risk Interventions Readmission Risk Prevention Plan 10/28/2018  Transportation Screening Complete  PCP or Specialist Appt within 5-7 Days Not Complete  Not Complete comments Not DC'ing yet  Home Care Screening Complete  Medication Review (RN CM) Complete  Some recent data might be hidden

## 2018-10-30 NOTE — Progress Notes (Signed)
Patient stable. Instructions given about home care and new RX. Patient informed to keep track of intake and output. Also instructed to weigh herself daily. Patient verbalized understanding.

## 2018-10-30 NOTE — Discharge Summary (Signed)
[facility] [place] NAME: Veronica Wang    MR#:  [id number]  DATE OF BIRTH:  [birth year]  DATE OF ADMISSION:  10/26/2018 ADMITTING PHYSICIAN: Saundra Shelling, MD  DATE OF DISCHARGE:  10/30/18   PRIMARY CARE PHYSICIAN: Adin Hector, MD    ADMISSION DIAGNOSIS:  Chronic obstructive pulmonary disease, unspecified COPD type ([place]) [P37.9] Systolic congestive heart failure, unspecified HF chronicity ([place]) [I50.20]  DISCHARGE DIAGNOSIS:  Active Problems:   Acute exacerbation of CHF (congestive heart failure) (HCC) Paroxysmal atrial fibrillation   SECONDARY DIAGNOSIS:   Past Medical History:  Diagnosis Date  . Abnormal MRI 09/10/2015  . Atrial fibrillation ([place])   . Cancer ([place])    Liver  . Cancer ([place])    stomach  . Carcinoid tumor of intestine   . CHF (congestive heart failure) ([place])   . COPD (chronic obstructive pulmonary disease) ([place])   . FH: chemotherapy   . GERD (gastroesophageal reflux disease)   . Hyperlipidemia   . Hypertension   . Macular degeneration   . Polycythemia   . Radiation     HOSPITAL COURSE:  HISTORY OF PRESENT ILLNESS: Veronica Wang  is a 83 y.o. female with a known history of atrial fibrillation, hepatocellular carcinoma, chronic congestive heart failure, COPD, hyperlipidemia, hypertension presented to the emergency room for shortness of breath.  Patient has orthopnea.  No complaints of cough, fever and chills.  She was tested for coronavirus and is negative.  BNP is elevated and chest x-ray shows pulmonary edema and fluid overload.  Patient was given IV Lasix for diuresis.  Hospital service was consulted.   1.  Acute hypoxic respiratory failure in the setting of acute on chronic systolic heart failure/intermittent episodes of A. fib with RVR:  Patient has been weaned to room air.  Clinically improving Patient was tested for COVID-19 and she was negative IV Cardizem as needed No cardiac interventions needed  as per Dr. Nehemiah Massed  2.  Acute on chronic severe systolic heart failure -EF 20 to 25% Continue Lasix with potassium supplements as needed Monitor intake and output   Echocardiogram with 20 to 25% EF Cardiology Dr. Nehemiah Massed has seen the patient during the hospital course.  Outpatient follow-up with [facility] CHF clinic upon discharge Continue lisinopril Consider adding beta-blocker once acute CHF resolves.  repeat chest x-ray with the regulation of pulmonary interstitial edema  after diuresis.   3.  PAF RVR: Patient would benefit from beta-blocker for better heart rate control. Cardizem boluses as needed  Weaned off  Cardizem drip  continue Pradaxa for stroke prevention  4.  Hypokalemia: Repleted.  Potassium at 4.2   5.  Hyperlipidemia: Continue statin  6.  COPD without signs of exacerbation  7.  Murmur: Follow-up on echo which is pending  8.  Insomnia melatonin  Physical therapy for discharge planning-recommending home health PT  Management plans discussed with the patient and she is in agreement.  DISCHARGE CONDITIONS:   Fair   CONSULTS OBTAINED:     PROCEDURES  None   DRUG ALLERGIES:   Allergies  Allergen Reactions  . No Known Allergies     DISCHARGE MEDICATIONS:   Allergies as of 10/30/2018      Reactions   No Known Allergies       Medication List    STOP taking these medications   Eliquis 2.5 MG Tabs tablet Generic drug:  apixaban     TAKE these medications   acetaminophen 325  MG tablet Commonly known as:  TYLENOL Take 2 tablets (650 mg total) by mouth every 6 (six) hours as needed for mild pain (or Fever >/= 101).   albuterol 108 (90 Base) MCG/ACT inhaler Commonly known as:  VENTOLIN HFA Inhale 2 puffs into the lungs every 6 (six) hours as needed for wheezing or shortness of breath.   atorvastatin 40 MG tablet Commonly known as:  LIPITOR Take 1 tablet (40 mg total) by mouth daily at 6 PM.   Centrum Silver tablet Take 1 tablet  by mouth daily.   dabigatran 75 MG Caps capsule Commonly known as:  PRADAXA Take 75 mg by mouth 2 (two) times daily.   famotidine 20 MG tablet Commonly known as:  PEPCID Take 20 mg by mouth daily.   feeding supplement Liqd Take 1 Container by mouth 3 (three) times daily between meals.   furosemide 20 MG tablet Commonly known as:  LASIX Take 20 mg by mouth daily.   LANREOTIDE ACETATE [place] Inject into the skin.   lidocaine 2 % solution Commonly known as:  XYLOCAINE Use as directed 15 mLs in the mouth or throat every 6 (six) hours as needed (chest/stomach pain).   lisinopril 5 MG tablet Commonly known as:  ZESTRIL Take 5 mg by mouth daily.   loperamide 2 MG tablet Commonly known as:  IMODIUM A-D Take 4 mg by mouth 4 (four) times daily as needed for diarrhea or loose stools.   Melatonin 5 MG Tabs Take 1 tablet (5 mg total) by mouth at bedtime.   metoprolol tartrate 25 MG tablet Commonly known as:  LOPRESSOR Take 1 tablet (25 mg total) by mouth 2 (two) times daily.   octreotide 100 MCG/ML Soln injection Commonly known as:  SANDOSTATIN Inject 100 mcg into the skin every 30 (thirty) days.   ondansetron 8 MG tablet Commonly known as:  ZOFRAN Take 1 tablet (8 mg total) by mouth every 8 (eight) hours as needed for nausea or vomiting.   potassium chloride 10 MEQ tablet Commonly known as:  K-DUR Take 10 mEq by mouth daily.   prochlorperazine 10 MG tablet Commonly known as:  COMPAZINE Take 1 tablet (10 mg total) by mouth every 6 (six) hours as needed for nausea or vomiting.   Telotristat Ethyl(as Etiprate) 250 MG Tabs Take 1 tablet by mouth 3 (three) times daily.   Vitamin B-12 CR 1000 MCG Tbcr Take 1 tablet by mouth daily.   Vitamin D-1000 Max St 25 MCG (1000 UT) tablet Generic drug:  Cholecalciferol Take 1,000 Units by mouth daily.            Durable Medical Equipment  (From admission, onward)         Start     Ordered   10/30/18 1127  For home use only  DME oxygen  Once    Question Answer Comment  Mode or (Route) Nasal cannula   Liters per Minute 2   Frequency Continuous (stationary and portable oxygen unit needed)   Oxygen conserving device Yes   Oxygen delivery system Gas      10/30/18 1126           DISCHARGE INSTRUCTIONS:   Follow-up with primary care physician in 3 days Follow-up with [facility] cardiology in a week Outpatient follow-up with CHF clinic in 2 days Daily weight monitoring intake and output  DIET:  Cardiac diet  DISCHARGE CONDITION:  Fair  ACTIVITY:  Activity as tolerated  OXYGEN:  Home Oxygen: Yes.  Oxygen Delivery: 2 liters/min via Patient connected to nasal cannula oxygen  DISCHARGE LOCATION:  home   If you experience worsening of your admission symptoms, develop shortness of breath, life threatening emergency, suicidal or homicidal thoughts you must seek medical attention immediately by calling 911 or calling your MD immediately  if symptoms less severe.  You Must read complete instructions/literature along with all the possible adverse reactions/side effects for all the Medicines you take and that have been prescribed to you. Take any new Medicines after you have completely understood and accpet all the possible adverse reactions/side effects.   Please note  You were cared for by a hospitalist during your hospital stay. If you have any questions about your discharge medications or the care you received while you were in the hospital after you are discharged, you can call the unit and asked to speak with the hospitalist on call if the hospitalist that took care of you is not available. Once you are discharged, your primary care physician will handle any further medical issues. Please note that NO REFILLS for any discharge medications will be authorized once you are discharged, as it is imperative that you return to your primary care physician (or establish a relationship with a primary care  physician if you do not have one) for your aftercare needs so that they can reassess your need for medications and monitor your lab values.     Today  Chief Complaint  Patient presents with  . Shortness of Breath   Patient is feeling much better and wants to go home today.  Heart rate well controlled  ROS:  CONSTITUTIONAL: Denies fevers, chills. Denies any fatigue, weakness.  EYES: Denies blurry vision, double vision, eye pain. EARS, NOSE, THROAT: Denies tinnitus, ear pain, hearing loss. RESPIRATORY: Denies cough, wheeze, shortness of breath.  CARDIOVASCULAR: Denies chest pain, palpitations, edema.  GASTROINTESTINAL: Denies nausea, vomiting, diarrhea, abdominal pain. Denies bright red blood per rectum. GENITOURINARY: Denies dysuria, hematuria. ENDOCRINE: Denies nocturia or thyroid problems. HEMATOLOGIC AND LYMPHATIC: Denies easy bruising or bleeding. SKIN: Denies rash or lesion. MUSCULOSKELETAL: Denies pain in neck, back, shoulder, knees, hips or arthritic symptoms.  NEUROLOGIC: Denies paralysis, paresthesias.  PSYCHIATRIC: Denies anxiety or depressive symptoms.   VITAL SIGNS:  Blood pressure (!) 125/42, pulse 88, temperature 97.7 F (36.5 C), temperature source Oral, resp. rate 16, height 5\' 2"  (1.575 m), weight 28.5 kg, SpO2 100 %.  I/O:    Intake/Output Summary (Last 24 hours) at 10/30/2018 1140 Last data filed at 10/30/2018 0933 Gross per 24 hour  Intake 840 ml  Output 0 ml  Net 840 ml    PHYSICAL EXAMINATION:  GENERAL:  83 y.o.-year-old patient lying in the bed with no acute distress.  EYES: Pupils equal, round, reactive to light and accommodation. No scleral icterus. Extraocular muscles intact.  HEENT: Head atraumatic, normocephalic. Oropharynx and nasopharynx clear.  NECK:  Supple, no jugular venous distention. No thyroid enlargement, no tenderness.  LUNGS: Normal breath sounds bilaterally, no wheezing, rales,rhonchi or crepitation. No use of accessory muscles of  respiration.  CARDIOVASCULAR: S1, S2 normal. No murmurs, rubs, or gallops.  ABDOMEN: Soft, non-tender, non-distended. Bowel sounds present.  EXTREMITIES: No pedal edema, cyanosis, or clubbing.  NEUROLOGIC: Awake alert and oriented x3 sensation intact. Gait not checked.  PSYCHIATRIC: The patient is alert and oriented x 3.  SKIN: No obvious rash, lesion, or ulcer.   DATA REVIEW:   CBC Recent Labs  Lab 10/26/18 1525  WBC 9.0  HGB 12.6  HCT 39.6  PLT 247    Chemistries  Recent Labs  Lab 10/26/18 1525  10/30/18 0444  NA 141   < > 139  K 3.4*   < > 4.5  CL 102   < > 102  CO2 31   < > 28  GLUCOSE 110*   < > 114*  BUN 16   < > 29*  CREATININE 0.52   < > 0.67  CALCIUM 8.4*   < > 8.8*  AST 36  --   --   ALT 30  --   --   ALKPHOS 141*  --   --   BILITOT 0.4  --   --    < > = values in this interval not displayed.    Cardiac Enzymes Recent Labs  Lab 10/26/18 1525  TROPONINI 0.03*    Microbiology Results  Results for orders placed or performed during the hospital encounter of 10/26/18  SARS Coronavirus 2 [facility] order, Performed in [place] hospital lab)     Status: None   Collection Time: 10/26/18  3:25 PM  Result Value Ref Range Status   SARS Coronavirus 2 NEGATIVE NEGATIVE Final    Comment: (NOTE) If result is NEGATIVE SARS-CoV-2 target nucleic acids are NOT DETECTED. The SARS-CoV-2 RNA is generally detectable in upper and lower  respiratory specimens during the acute phase of infection. The lowest  concentration of SARS-CoV-2 viral copies this assay can detect is 250  copies / mL. A negative result does not preclude SARS-CoV-2 infection  and should not be used as the sole basis for treatment or other  patient management decisions.  A negative result may occur with  improper specimen collection / handling, submission of specimen other  than nasopharyngeal swab, presence of viral mutation(s) within the  areas targeted by this assay, and inadequate number of  viral copies  (<250 copies / mL). A negative result must be combined with clinical  observations, patient history, and epidemiological information. If result is POSITIVE SARS-CoV-2 target nucleic acids are DETECTED. The SARS-CoV-2 RNA is generally detectable in upper and lower  respiratory specimens dur ing the acute phase of infection.  Positive  results are indicative of active infection with SARS-CoV-2.  Clinical  correlation with patient history and other diagnostic information is  necessary to determine patient infection status.  Positive results do  not rule out bacterial infection or co-infection with other viruses. If result is PRESUMPTIVE POSTIVE SARS-CoV-2 nucleic acids MAY BE PRESENT.   A presumptive positive result was obtained on the submitted specimen  and confirmed on repeat testing.  While 2019 novel coronavirus  (SARS-CoV-2) nucleic acids may be present in the submitted sample  additional confirmatory testing may be necessary for epidemiological  and / or clinical management purposes  to differentiate between  SARS-CoV-2 and other Sarbecovirus currently known to infect humans.  If clinically indicated additional testing with an alternate test  methodology [phone]) is advised. The SARS-CoV-2 RNA is generally  detectable in upper and lower respiratory sp ecimens during the acute  phase of infection. The expected result is Negative. Fact Sheet for Patients:  StrictlyIdeas.no Fact Sheet for Healthcare Providers: BankingDealers.co.za This test is not yet approved or cleared by the [place] FDA and has been authorized for detection and/or diagnosis of SARS-CoV-2 by FDA under an Emergency Use Authorization (EUA).  This EUA will remain in effect (meaning this test can be used) for the duration of the COVID-19 declaration under Section 564(b)(1) of the  Act, 21 U.S.C. section 360bbb-3(b)(1), unless the authorization is  terminated or revoked sooner. Performed at [facility], [address] [place], [place] [address]     RADIOLOGY:  Dg Chest 1 View  Result Date: 10/28/2018 CLINICAL DATA:  83 year old female with shortness of breath. EXAM: CHEST  1 VIEW COMPARISON:  10/26/2018 and earlier. FINDINGS: Portable AP upright view at 0502 hours. Persistent left pleural effusion with associated lung base opacity. Smaller right pleural effusion also again suspected. Decreased bilateral pulmonary interstitial opacity since 10/26/2018. Mild indistinct asymmetric peripheral right upper lung opacity. No pneumothorax. Stable cardiomegaly and mediastinal contours. Paucity of bowel gas in the upper abdomen. No acute osseous abnormality identified. IMPRESSION: Suspect regressed pulmonary interstitial edema since 10/26/2018 with persistent small to moderate left and small right pleural effusions. However, there is also mild indistinct right upper lung opacity. Respiratory infection is difficult to exclude. Electronically Signed   By: Genevie Ann M.D.   On: 10/28/2018 06:51   Dg Chest Port 1 View  Result Date: 10/26/2018 CLINICAL DATA:  Smoker and chronic shortness of breath. EXAM: PORTABLE CHEST 1 VIEW COMPARISON:  07/18/2018 and prior radiograph FINDINGS: Cardiomegaly noted. New bilateral interstitial opacities are noted suspicious for interstitial edema. Small bilateral pleural effusions, LEFT-greater-than-RIGHT, noted. LEFT LOWER lung consolidation/atelectasis identified. No pneumothorax or acute bony abnormality. IMPRESSION: 1. New bilateral interstitial opacities likely representing pulmonary edema. 2. New small bilateral pleural effusions, LEFT-greater-than-RIGHT, with LEFT LOWER lung consolidation/atelectasis. Electronically Signed   By: Margarette Canada M.D.   On: 10/26/2018 15:53    EKG:   Orders placed or performed during the hospital encounter of 10/26/18  . ED EKG  . ED EKG  . EKG 12-Lead  . EKG 12-Lead  . EKG       Management plans discussed with the patient, she is  in agreement.  CODE STATUS:     Code Status Orders  (From admission, onward)         Start     Ordered   10/27/18 1027  Do not attempt resuscitation (DNR)  Continuous    Question Answer Comment  In the event of cardiac or respiratory ARREST Do not call a "code blue"   In the event of cardiac or respiratory ARREST Do not perform Intubation, CPR, defibrillation or ACLS   In the event of cardiac or respiratory ARREST Use medication by any route, position, wound care, and other measures to relive pain and suffering. May use oxygen, suction and manual treatment of airway obstruction as needed for comfort.      10/27/18 1026        Code Status History    Date Active Date Inactive Code Status Order ID Comments User Context   10/26/2018 2000 10/27/2018 1026 Full Code [id number]  Saundra Shelling, MD Inpatient   08/14/2018 0300 08/15/2018 1605 Full Code [id number]  Lance Coon, MD Inpatient   12/15/2014 1933 01/06/2015 1806 Full Code [id number]  Corey Harold Inpatient   12/05/2014 0340 12/15/2014 1933 Full Code [id number]  Juluis Mire, MD Inpatient      TOTAL TIME TAKING CARE OF THIS PATIENT: 43  minutes.   Note: This dictation was prepared with Dragon dictation along with smaller phrase technology. Any transcriptional errors that result from this process are unintentional.   @[facility] @  on 10/30/2018 at 11:40 AM  Between 7am to 6pm - Pager - [phone]  After 6pm go to [email or web address] - password [email or web address] [facility]  [facility]  Office  [phone]  CC:  Primary care physician; Adin Hector, MD

## 2018-10-30 NOTE — Discharge Instructions (Signed)
Follow-up with primary care physician in 3 days Follow-up with [facility] cardiology in a week Outpatient follow-up with CHF clinic in 2 days Daily weight monitoring intake and output

## 2018-10-30 NOTE — Progress Notes (Signed)
SATURATION QUALIFICATIONS: (This note is used to comply with regulatory documentation for home oxygen)  Patient Saturations on Room Air at Rest = 87%  Patient Saturations on Room Air while Ambulating = n/a%  Patient Saturations on n/a Liters of oxygen while Ambulating = n/a%  Please briefly explain why patient needs home oxygen:  Patient desats on room air with no exertion.

## 2018-11-01 ENCOUNTER — Other Ambulatory Visit: Payer: Self-pay | Admitting: Internal Medicine

## 2018-11-01 ENCOUNTER — Telehealth: Payer: Self-pay

## 2018-11-01 ENCOUNTER — Institutional Professional Consult (permissible substitution): Admit: 2018-11-01 | Discharge: 2018-11-02 | Payer: MEDICARE

## 2018-11-01 DIAGNOSIS — C7A8 Other malignant neuroendocrine tumors: Principal | ICD-10-CM

## 2018-11-01 DIAGNOSIS — E340 Carcinoid syndrome: Secondary | ICD-10-CM

## 2018-11-01 DIAGNOSIS — C7B8 Other secondary neuroendocrine tumors: Secondary | ICD-10-CM

## 2018-11-01 NOTE — Unmapped (Signed)
Pt received Lanreotide injection in left hip sub q - pt tolerated well.    AVS given.    Pt stable and ambulated independently from clinic.

## 2018-11-01 NOTE — Telephone Encounter (Signed)
TELEPHONE CALL NOTE  Veronica Wang has been deemed a candidate for a follow-up tele-health visit to limit community exposure during the Covid-19 pandemic. I spoke with the patient via phone to ensure availability of phone/video source, confirm preferred email & phone number, discuss instructions and expectations, and review consent.   I reminded Veronica Wang to be prepared with any vital sign and/or heart rhythm information that could potentially be obtained via home monitoring, at the time of Veronica Wang visit.  Finally, I reminded Veronica Wang to expect an e-mail containing a link for their video-based visit approximately 15 minutes before Veronica Wang visit, or alternatively, a phone call at the time of Veronica Wang visit if Veronica Wang visit is planned to be a phone encounter.  Did the patient verbally consent to treatment as below? YES  Gaylord Shih, CMA 11/01/2018 2:44 PM  CONSENT FOR TELE-HEALTH VISIT - PLEASE REVIEW  I hereby voluntarily request, consent and authorize [facility] and its employed or contracted physicians, physician assistants, nurse practitioners or other licensed health care professionals (the Practitioner), to provide me with telemedicine health care services (the "Services") as deemed necessary by the treating Practitioner. I acknowledge and consent to receive the Services by the Practitioner via telemedicine. I understand that the telemedicine visit will involve communicating with the Practitioner through telephonic communication technology and the disclosure of certain medical information by electronic transmission. I acknowledge that I have been given the opportunity to request an in-person assessment or other available alternative prior to the telemedicine visit and am voluntarily participating in the telemedicine visit.  I understand that I have the right to withhold or withdraw my consent to the use of telemedicine in the course of my care at any time, without  affecting my right to future care or treatment, and that the Practitioner or I may terminate the telemedicine visit at any time. I understand that I have the right to inspect all information obtained and/or recorded in the course of the telemedicine visit and may receive copies of available information for a reasonable fee.  I understand that some of the potential risks of receiving the Services via telemedicine include:  Marland Kitchen Delay or interruption in medical evaluation due to technological equipment failure or disruption; . Information transmitted may not be sufficient (e.g. poor resolution of images) to allow for appropriate medical decision making by the Practitioner; and/or  . In rare instances, security protocols could fail, causing a breach of personal health information.  Furthermore, I acknowledge that it is my responsibility to provide information about my medical history, conditions and care that is complete and accurate to the best of my ability. I acknowledge that Practitioner's advice, recommendations, and/or decision may be based on factors not within their control, such as incomplete or inaccurate data provided by me or lack of visual representation. I understand that the practice of medicine is not an exact science and that Practitioner makes no warranties or guarantees regarding treatment outcomes. I acknowledge that I will receive a copy of this consent concurrently upon execution via email to the email address I last provided but may also request a printed copy by calling the office of [facility].    I understand that my insurance may be billed for this visit.   I have read or had this consent read to me. . I understand the contents of this consent, which adequately explains the benefits and risks of the Services being provided via telemedicine.  Marland Kitchen  I have been provided ample opportunity to ask questions regarding this consent and the Services and have had my questions  answered to my satisfaction. . I give my informed consent for the services to be provided through the use of telemedicine in my medical care  By participating in this telemedicine visit I agree to the above.

## 2018-11-01 NOTE — Telephone Encounter (Signed)
   TELEPHONE CALL NOTE  This patient has been deemed a candidate for follow-up tele-health visit to limit community exposure during the Covid-19 pandemic. I spoke with the patient via phone to discuss instructions. The patient was advised to review the section on consent for treatment as well. The patient will receive a phone call 2-3 days prior to their E-Visit at which time consent will be verbally confirmed. A Virtual Office Visit appointment type has been scheduled for 11/04/2018 with Darylene Price FNP.  Gaylord Shih, CMA 11/01/2018 2:44 PM

## 2018-11-04 ENCOUNTER — Ambulatory Visit: Payer: Medicare Other | Attending: Family | Admitting: Family

## 2018-11-04 ENCOUNTER — Encounter: Payer: Self-pay | Admitting: Family

## 2018-11-04 ENCOUNTER — Other Ambulatory Visit: Payer: Self-pay

## 2018-11-04 VITALS — Wt 83.0 lb

## 2018-11-04 DIAGNOSIS — I5022 Chronic systolic (congestive) heart failure: Secondary | ICD-10-CM

## 2018-11-04 DIAGNOSIS — I480 Paroxysmal atrial fibrillation: Secondary | ICD-10-CM

## 2018-11-04 MED ORDER — METOPROLOL TARTRATE 25 MG PO TABS: 25.0000 mg | ORAL_TABLET | Freq: Two times a day (BID) | ORAL | 5 refills | Status: DC

## 2018-11-04 MED ORDER — DABIGATRAN ETEXILATE MESYLATE 75 MG PO CAPS: 75.0000 mg | ORAL_CAPSULE | Freq: Two times a day (BID) | ORAL | 5 refills | Status: DC

## 2018-11-04 NOTE — Progress Notes (Signed)
Virtual Visit via Telephone Note    Evaluation Performed:  Follow-up visit  This visit type was conducted due to national recommendations for restrictions regarding the COVID-19 Pandemic (e.g. social distancing).  This format is felt to be most appropriate for this patient at this time.  All issues noted in this document were discussed and addressed.  No physical exam was performed (except for noted visual exam findings with Video Visits).  Please refer to the patient's chart (MyChart message for video visits and phone note for telephone visits) for the patient's consent to telehealth for [facility]  Date:  11/04/2018   ID:  Veronica Wang, DOB 18-Feb-1936, MRN [id number]  Patient Location:  [facility] [place] [address]   Provider location:   [facility] [place] [address], [place] [address]  PCP:  Adin Hector, MD  Cardiologist:  Serafina Royals MD  Electrophysiologist:  None   Chief Complaint:  Moderate fatigue  History of Present Illness:    Veronica Wang is a 83 y.o. female who presents via audio/video conferencing for a telehealth visit today.  Patient verified DOB and address.  The patient does not have symptoms concerning for COVID-19 infection (fever, chills, cough, or new SHORTNESS OF BREATH).   Patient reports moderate fatigue upon minimal exertion. She says that this has been present for several years. She has associated palpitations and diarrhea along with this. She denies any pedal edema, abdominal distention, chest pain, shortness of breath, cough, sore throat, difficulty sleeping or weight gain. She does have oxygen at home that she uses as needed if her oxygen sat gets below 90%. When she does wear it, she puts it at 1L. Says that she's supposed to be taking pradaxa but recently ran out and needs a new prescription. She says that she also needs a new prescription for the metoprolol.    Prior CV studies:   The  following studies were reviewed today:  Echo report from 10/27/2018 reviewed and showed an EF of 20-25% along with moderate/ severe MR.   Past Medical History:  Diagnosis Date  . Abnormal MRI 09/10/2015  . Atrial fibrillation ([place])   . Cancer ([place])    Liver  . Cancer ([place])    stomach  . Carcinoid tumor of intestine   . CHF (congestive heart failure) ([place])   . COPD (chronic obstructive pulmonary disease) ([place])   . FH: chemotherapy   . GERD (gastroesophageal reflux disease)   . Hyperlipidemia   . Hypertension   . Macular degeneration   . Polycythemia   . Radiation    Past Surgical History:  Procedure Laterality Date  . CATARACT EXTRACTION  splenectomy  . COLON RESECTION    . COLON SURGERY    . GASTRECTOMY    . SPLENECTOMY       Current Meds  Medication Sig  . acetaminophen (TYLENOL) 325 MG tablet Take 2 tablets (650 mg total) by mouth every 6 (six) hours as needed for mild pain (or Fever >/= 101).  Marland Kitchen albuterol (PROVENTIL HFA;VENTOLIN HFA) 108 (90 BASE) MCG/ACT inhaler Inhale 2 puffs into the lungs every 6 (six) hours as needed for wheezing or shortness of breath.  Marland Kitchen atorvastatin (LIPITOR) 40 MG tablet Take 1 tablet (40 mg total) by mouth daily at 6 PM.  . Cholecalciferol (VITAMIN D-1000 MAX ST) 1000 UNITS tablet Take 1,000 Units by mouth daily.   . Cyanocobalamin (VITAMIN B-12 CR) 1000 MCG TBCR Take  1 tablet by mouth daily.   . dabigatran (PRADAXA) 75 MG CAPS capsule Take 1 capsule (75 mg total) by mouth 2 (two) times daily.  . furosemide (LASIX) 20 MG tablet Take 20 mg by mouth daily.  Marland Kitchen LANREOTIDE ACETATE [place] Inject into the skin.  Marland Kitchen lisinopril (PRINIVIL,ZESTRIL) 5 MG tablet Take 5 mg by mouth daily.  Marland Kitchen loperamide (IMODIUM A-D) 2 MG tablet Take 4 mg by mouth 4 (four) times daily as needed for diarrhea or loose stools.  . Melatonin 5 MG TABS Take 1 tablet (5 mg total) by mouth at bedtime.  . metoprolol tartrate (LOPRESSOR) 25 MG tablet Take 1 tablet (25 mg total) by mouth 2  (two) times daily.  . Multiple Vitamins-Minerals (CENTRUM SILVER) tablet Take 1 tablet by mouth daily.   Marland Kitchen octreotide (SANDOSTATIN) 100 MCG/ML SOLN injection Inject 100 mcg into the skin every 30 (thirty) days.  . ondansetron (ZOFRAN) 8 MG tablet Take 1 tablet (8 mg total) by mouth every 8 (eight) hours as needed for nausea or vomiting.  . potassium chloride (K-DUR) 10 MEQ tablet Take 10 mEq by mouth daily.  . prochlorperazine (COMPAZINE) 10 MG tablet Take 1 tablet (10 mg total) by mouth every 6 (six) hours as needed for nausea or vomiting.  . Telotristat Etiprate 250 MG TABS Take 1 tablet by mouth 3 (three) times daily.  . [DISCONTINUED] dabigatran (PRADAXA) 75 MG CAPS capsule Take 75 mg by mouth 2 (two) times daily.  . [DISCONTINUED] metoprolol tartrate (LOPRESSOR) 25 MG tablet Take 1 tablet (25 mg total) by mouth 2 (two) times daily.     Allergies:   No known allergies   Social History   Tobacco Use  . Smoking status: Former Smoker    Types: Cigarettes  . Smokeless tobacco: Never Used  Substance Use Topics  . Alcohol use: No    Alcohol/week: 0.0 standard drinks  . Drug use: No     Family Hx: The patient's family history includes Heart attack in her brother and sister; Leukemia in her mother.  ROS:   Please see the history of present illness.     All other systems reviewed and are negative.   Labs/Other Tests and Data Reviewed:    Recent Labs: 08/14/2018: Magnesium 2.0 10/26/2018: ALT 30; Hemoglobin 12.6; Platelets 247 10/28/2018: B Natriuretic Peptide 1,110.0 10/30/2018: BUN 29; Creatinine, Ser 0.67; Potassium 4.5; Sodium 139   Recent Lipid Panel Lab Results  Component Value Date/Time   CHOL 158 08/14/2018 04:15 AM   CHOL 136 11/08/2013 03:09 AM   TRIG 89 08/14/2018 04:15 AM   TRIG 84 11/08/2013 03:09 AM   HDL 69 08/14/2018 04:15 AM   HDL 51 11/08/2013 03:09 AM   CHOLHDL 2.3 08/14/2018 04:15 AM   LDLCALC 71 08/14/2018 04:15 AM   LDLCALC 68 11/08/2013 03:09 AM     Wt Readings from Last 3 Encounters:  11/04/18 83 lb (37.6 kg)  10/30/18 62 lb 12.8 oz (28.5 kg)  08/14/18 64 lb 9.6 oz (29.3 kg)     Exam:    Vital Signs:  Wt 83 lb (37.6 kg) Comment: self-reported  BMI 15.18 kg/m    Well nourished, well developed female in no  acute distress.   ASSESSMENT & PLAN:    1. Chronic heart failure with reduced ejection fraction- - NYHA class III - euvolemic today based on patient's description of symptoms - weighing daily and says that her weight has been stable. Reminded to call for an overnight weight gain of >  2 pounds or a weekly weight gain of > 5 pounds - not adding salt and she says that her daughter cooks for her but doesn't add salt to her food - EF has declined quite a bit from 2 months ago (EF was 45-50%)   - may need to change her BID metoprolol to once daily formulation if EF remains low - is going to try and get a BP cuff so that she can check her BP at home  2: Atrial fibrillation- - refilled metoprolol & dabigatran today - patient says that she's supposed to be having a telemedicine visit with cardiology Nehemiah Massed) soon  COVID-19 Education: The signs and symptoms of COVID-19 were discussed with the patient and how to seek care for testing (follow up with PCP or arrange E-visit).  The importance of social distancing was discussed today.  Patient Risk:   After full review of this patients clinical status, I feel that they are at least moderate risk at this time.  Time:   Today, I have spent 20 minutes with the patient with telehealth technology discussing medications, weight, diet and symptoms to call about.    Medication Adjustments/Labs and Tests Ordered: Current medicines are reviewed at length with the patient today.  Concerns regarding medicines are outlined above.   Tests Ordered: No orders of the defined types were placed in this encounter.  Medication Changes: Meds ordered this encounter  Medications  . dabigatran  (PRADAXA) 75 MG CAPS capsule    Sig: Take 1 capsule (75 mg total) by mouth 2 (two) times daily.    Dispense:  60 capsule    Refill:  5  . metoprolol tartrate (LOPRESSOR) 25 MG tablet    Sig: Take 1 tablet (25 mg total) by mouth 2 (two) times daily.    Dispense:  60 tablet    Refill:  5    Disposition:  Follow-up in 3 months or sooner for any questions/problems before then.   Signed, Alisa Graff, FNP  11/04/2018 12:51 PM    [facility]

## 2018-11-04 NOTE — Patient Instructions (Signed)
Continue weighing daily and call for an overnight weight gain of > 2 pounds or a weekly weight gain of >5 pounds. 

## 2018-11-04 NOTE — Unmapped (Signed)
This patient has been disenrolled from the [facility] specialty pharmacy services due to inability of the pharmacy to reach the patient.    Allison Fisher  [facility] Specialty Pharmacist

## 2018-11-07 ENCOUNTER — Ambulatory Visit: Admit: 2018-11-07 | Discharge: 2018-11-08 | Payer: MEDICARE

## 2018-11-07 DIAGNOSIS — D3A00 Benign carcinoid tumor of unspecified site: Principal | ICD-10-CM

## 2018-11-14 ENCOUNTER — Institutional Professional Consult (permissible substitution)
Admit: 2018-11-14 | Discharge: 2018-11-15 | Payer: MEDICARE | Attending: Hematology & Oncology | Primary: Hematology & Oncology

## 2018-11-14 DIAGNOSIS — I5043 Acute on chronic combined systolic (congestive) and diastolic (congestive) heart failure: Principal | ICD-10-CM

## 2018-11-15 ENCOUNTER — Institutional Professional Consult (permissible substitution): Admit: 2018-11-15 | Discharge: 2018-11-16 | Payer: MEDICARE

## 2018-11-15 DIAGNOSIS — C7A8 Other malignant neuroendocrine tumors: Principal | ICD-10-CM

## 2018-11-15 DIAGNOSIS — C7B8 Other secondary neuroendocrine tumors: Secondary | ICD-10-CM

## 2018-11-15 DIAGNOSIS — E340 Carcinoid syndrome: Secondary | ICD-10-CM

## 2018-11-29 ENCOUNTER — Institutional Professional Consult (permissible substitution): Admit: 2018-11-29 | Discharge: 2018-11-30 | Payer: MEDICARE

## 2018-11-29 DIAGNOSIS — C7A8 Other malignant neuroendocrine tumors: Principal | ICD-10-CM

## 2018-11-29 DIAGNOSIS — C7B8 Other secondary neuroendocrine tumors: Secondary | ICD-10-CM

## 2018-11-29 DIAGNOSIS — E340 Carcinoid syndrome: Secondary | ICD-10-CM

## 2018-12-13 ENCOUNTER — Institutional Professional Consult (permissible substitution): Admit: 2018-12-13 | Discharge: 2018-12-14 | Payer: MEDICARE

## 2018-12-13 DIAGNOSIS — C7A8 Other malignant neuroendocrine tumors: Principal | ICD-10-CM

## 2018-12-13 DIAGNOSIS — C7B8 Other secondary neuroendocrine tumors: Secondary | ICD-10-CM

## 2018-12-13 DIAGNOSIS — E340 Carcinoid syndrome: Secondary | ICD-10-CM

## 2018-12-27 ENCOUNTER — Institutional Professional Consult (permissible substitution): Admit: 2018-12-27 | Discharge: 2018-12-28 | Payer: MEDICARE

## 2018-12-27 DIAGNOSIS — C7B8 Other secondary neuroendocrine tumors: Secondary | ICD-10-CM

## 2018-12-27 DIAGNOSIS — C7A8 Other malignant neuroendocrine tumors: Principal | ICD-10-CM

## 2018-12-27 DIAGNOSIS — E340 Carcinoid syndrome: Secondary | ICD-10-CM

## 2019-01-09 ENCOUNTER — Institutional Professional Consult (permissible substitution): Admit: 2019-01-09 | Discharge: 2019-01-10 | Payer: MEDICARE

## 2019-01-09 DIAGNOSIS — C7A8 Other malignant neuroendocrine tumors: Principal | ICD-10-CM

## 2019-01-09 DIAGNOSIS — C7B8 Other secondary neuroendocrine tumors: Secondary | ICD-10-CM

## 2019-01-09 DIAGNOSIS — E340 Carcinoid syndrome: Secondary | ICD-10-CM

## 2019-01-24 ENCOUNTER — Telehealth: Admit: 2019-01-24 | Discharge: 2019-01-25 | Payer: MEDICARE

## 2019-01-24 ENCOUNTER — Institutional Professional Consult (permissible substitution): Admit: 2019-01-24 | Discharge: 2019-01-25 | Payer: MEDICARE

## 2019-01-24 DIAGNOSIS — E340 Carcinoid syndrome: Secondary | ICD-10-CM

## 2019-01-24 DIAGNOSIS — C7A8 Other malignant neuroendocrine tumors: Principal | ICD-10-CM

## 2019-01-24 DIAGNOSIS — C7B8 Other secondary neuroendocrine tumors: Secondary | ICD-10-CM

## 2019-02-01 NOTE — Progress Notes (Signed)
Patient ID: Veronica Wang, female    DOB: 03-01-1936, 83 y.o.   MRN: [id number]  HPI  Veronica Wang is a 83 y/o female with a history of liver, stomach and intestinal cancer, hyperlipidemia, HTN, GERD, COPD, atrial fibrillation, macular degeneration, previous tobacco use and chronic heart failure.   Echo report from 10/27/2018 reviewed and showed an EF of 20-25% along with moderate/severe MR. Echo report 04/17/17 reviewed and showed an EF of 50% along with mild AR/MR/TR.  Admitted 10/26/2018 due to acute HF exacerbation. Cardiology consult done. COVID test negative. Cardizem bolus given due to atrial fibrillation. Discharged after 4 days.   She presents today for a follow-up visit with a chief complaint of moderate fatigue upon minimal exertion. She describes this as chronic in nature having been present for several years. She has associated shortness of breath, intermittent chest pain, pedal edema and slight weight gain along with this. She denies any difficulty sleeping, dizziness, abdominal distention or palpitations.   She parked at the wrong building and walked in without her oxygen on and her mask on. When she got to the exam room, her oxygen level was 83% and she was short of breath. She put her oxygen on at 2L and removed her mask and her oxygen level quickly rose to 90% and she said her breathing had improved.   Past Medical History:  Diagnosis Date  . Abnormal MRI 09/10/2015  . Atrial fibrillation ([place])   . Cancer ([place])    Liver  . Cancer ([place])    stomach  . Carcinoid tumor of intestine   . CHF (congestive heart failure) ([place])   . COPD (chronic obstructive pulmonary disease) ([place])   . FH: chemotherapy   . GERD (gastroesophageal reflux disease)   . Hyperlipidemia   . Hypertension   . Macular degeneration   . Polycythemia   . Radiation    Past Surgical History:  Procedure Laterality Date  . CATARACT EXTRACTION  splenectomy  . COLON RESECTION    . COLON SURGERY    .  GASTRECTOMY    . SPLENECTOMY     Family History  Problem Relation Age of Onset  . Heart attack Sister   . Heart attack Brother   . Leukemia Mother    Social History   Tobacco Use  . Smoking status: Former Smoker    Types: Cigarettes  . Smokeless tobacco: Never Used  Substance Use Topics  . Alcohol use: No    Alcohol/week: 0.0 standard drinks   Allergies  Allergen Reactions  . No Known Allergies    Prior to Admission medications   Medication Sig Start Date End Date Taking? Authorizing Provider  acetaminophen (TYLENOL) 325 MG tablet Take 2 tablets (650 mg total) by mouth every 6 (six) hours as needed for mild pain (or Fever >/= 101). 12/15/14  Yes Tama High III, MD  albuterol (PROVENTIL HFA;VENTOLIN HFA) 108 (90 BASE) MCG/ACT inhaler Inhale 2 puffs into the lungs every 6 (six) hours as needed for wheezing or shortness of breath.   Yes [provider]  atorvastatin (LIPITOR) 40 MG tablet Take 1 tablet (40 mg total) by mouth daily at 6 PM. 08/15/18  Yes Mayo, Pete Pelt, MD  Cholecalciferol (VITAMIN D-1000 MAX ST) 1000 UNITS tablet Take 1,000 Units by mouth daily.    Yes [provider]  Cyanocobalamin (VITAMIN B-12 CR) 1000 MCG TBCR Take 1 tablet by mouth daily.  03/02/11  Yes [provider]  dabigatran (PRADAXA) 75 MG  CAPS capsule Take 1 capsule (75 mg total) by mouth 2 (two) times daily. 11/04/18  Yes Hackney, Otila Kluver A, FNP  famotidine (PEPCID) 20 MG tablet Take 20 mg by mouth daily.    Yes [provider]  feeding supplement (BOOST HIGH PROTEIN) LIQD Take 1 Container by mouth 3 (three) times daily between meals.   Yes [provider]  furosemide (LASIX) 20 MG tablet Take 20 mg by mouth daily. 09/02/18  Yes [provider]  LANREOTIDE ACETATE [place] Inject into the skin.   Yes [provider]  lidocaine (XYLOCAINE) 2 % solution Use as directed 15 mLs in the mouth or throat every 6 (six) hours as needed (chest/stomach pain).  07/18/18  Yes Nance Pear, MD  lisinopril (PRINIVIL,ZESTRIL) 5 MG tablet Take 5 mg by mouth daily. 06/18/18  Yes [provider]  loperamide (IMODIUM A-D) 2 MG tablet Take 4 mg by mouth 4 (four) times daily as needed for diarrhea or loose stools.   Yes [provider]  Melatonin 5 MG TABS Take 1 tablet (5 mg total) by mouth at bedtime. 10/30/18  Yes Gouru, Illene Silver, MD  metoprolol tartrate (LOPRESSOR) 25 MG tablet Take 1 tablet (25 mg total) by mouth 2 (two) times daily. 11/04/18  Yes Hackney, Aura Fey, FNP  Multiple Vitamins-Minerals (CENTRUM SILVER) tablet Take 1 tablet by mouth daily.  03/02/11  Yes [provider]  octreotide (SANDOSTATIN) 100 MCG/ML SOLN injection Inject 100 mcg into the skin every 30 (thirty) days. 05/11/18  Yes [provider]  ondansetron (ZOFRAN) 8 MG tablet Take 1 tablet (8 mg total) by mouth every 8 (eight) hours as needed for nausea or vomiting. 09/17/17  Yes Burns, Wandra Feinstein, NP  potassium chloride (K-DUR) 10 MEQ tablet Take 10 mEq by mouth daily. 09/02/18  Yes [provider]  prochlorperazine (COMPAZINE) 10 MG tablet Take 1 tablet (10 mg total) by mouth every 6 (six) hours as needed for nausea or vomiting. 09/17/17  Yes Burns, Wandra Feinstein, NP  Telotristat Etiprate 250 MG TABS Take 1 tablet by mouth 3 (three) times daily. 05/27/18  Yes [provider]    Review of Systems  Constitutional: Positive for fatigue (tire easily). Negative for appetite change.  HENT: Negative for congestion, postnasal drip and sore throat.   Eyes: Negative.   Respiratory: Positive for shortness of breath (minimal). Negative for chest tightness.   Cardiovascular: Positive for chest pain (across left upper chest at times) and leg swelling. Negative for palpitations.  Gastrointestinal: Positive for diarrhea (due to intestional cancer). Negative for abdominal distention.  Endocrine: Negative.   Genitourinary: Negative.   Musculoskeletal: Negative  for arthralgias, back pain and neck pain.  Skin: Negative.   Allergic/Immunologic: Negative.   Neurological: Negative for dizziness and light-headedness.  Hematological: Negative for adenopathy. Does not bruise/bleed easily.  Psychiatric/Behavioral: Negative for dysphoric mood and sleep disturbance. The patient is not nervous/anxious.    Vitals:   02/03/19 1315 02/03/19 1328  BP: 129/79   Pulse: 97   Resp: 18   SpO2: (!) 83% 90%  Weight: 72 lb (32.7 kg)   Height: 4\' 9"  (1.448 m)    Wt Readings from Last 3 Encounters:  02/03/19 72 lb (32.7 kg)  11/04/18 83 lb (37.6 kg)  10/30/18 62 lb 12.8 oz (28.5 kg)   Lab Results  Component Value Date   CREATININE 0.67 10/30/2018   CREATININE 0.56 10/29/2018   CREATININE 0.61 10/28/2018     Physical Exam Vitals signs and nursing  note reviewed.  Constitutional:      Appearance: She is well-developed. She is cachectic.  HENT:     Head: Normocephalic and atraumatic.  Neck:     Musculoskeletal: Normal range of motion and neck supple.     Vascular: No JVD.  Cardiovascular:     Rate and Rhythm: Normal rate and regular rhythm.  Pulmonary:     Effort: Pulmonary effort is normal. No respiratory distress.     Breath sounds: No wheezing or rales.  Abdominal:     General: There is no distension.     Palpations: Abdomen is soft.  Musculoskeletal:     Right lower leg: She exhibits no tenderness. Edema (trace pitting) present.     Left lower leg: She exhibits no tenderness. Edema (1+ pitting) present.  Skin:    General: Skin is warm and dry.  Neurological:     Mental Status: She is alert and oriented to person, place, and time.  Psychiatric:        Behavior: Behavior normal.    Assessment & Plan:  1: Acute on Chronic heart failure with preserved ejection fraction- - NYHA class III - mildly fluid overloaded today with increased weight and pedal edema - weighing daily and says that her weight has been stable. Reminded to call for an  overnight weight gain of >2 pounds or a weekly weight gain of >5 pounds - weight up 4 pounds from last visit here 9 months ago - not adding salt to her food and tries to keep daily sodium intake to 2000mg  daily; she does admit that she's been eating saltier foods like shrimp as her family has moved in with her and is now doing the cooking and "they like salt" - advised her to double her furosemide and potassium for the next 2 days - put on her compression socks first thing in the morning and remove them at bedtime - saw cardiology ([place]) 11/19/2018 - had telemedicine visit with PCP Caryl Comes) 12/19/2018 - BMP 12/19/2018 reviewed and showed sodium 144, potassium 4.1, creatinine 0.8 and GFR 69 - BNP 10/28/2018 was 1100.0  2: Carcinoid tumor of intestine- - saw oncologist Grayland Ormond) 04/12/18 - had telemedicine visit with hematologist Altamease Oiler) 01/27/2019 - she says that she returns to the cancer center later this week  Patient did not bring her medications nor a list. Each medication was verbally reviewed with the patient and she was encouraged to bring the bottles to every visit to confirm accuracy of list.  Return in 10 days or sooner for any questions/problems before then.

## 2019-02-03 ENCOUNTER — Ambulatory Visit: Payer: Medicare Other | Attending: Family | Admitting: Family

## 2019-02-03 ENCOUNTER — Encounter: Payer: Self-pay | Admitting: Family

## 2019-02-03 ENCOUNTER — Other Ambulatory Visit: Payer: Self-pay

## 2019-02-03 VITALS — BP 129/79 | HR 97 | Resp 18 | Ht <= 58 in | Wt 72.0 lb

## 2019-02-03 DIAGNOSIS — H3530 Unspecified macular degeneration: Secondary | ICD-10-CM | POA: Insufficient documentation

## 2019-02-03 DIAGNOSIS — I110 Hypertensive heart disease with heart failure: Secondary | ICD-10-CM | POA: Diagnosis not present

## 2019-02-03 DIAGNOSIS — Z8249 Family history of ischemic heart disease and other diseases of the circulatory system: Secondary | ICD-10-CM | POA: Insufficient documentation

## 2019-02-03 DIAGNOSIS — Z9081 Acquired absence of spleen: Secondary | ICD-10-CM | POA: Diagnosis not present

## 2019-02-03 DIAGNOSIS — J449 Chronic obstructive pulmonary disease, unspecified: Secondary | ICD-10-CM | POA: Insufficient documentation

## 2019-02-03 DIAGNOSIS — I5033 Acute on chronic diastolic (congestive) heart failure: Secondary | ICD-10-CM | POA: Diagnosis not present

## 2019-02-03 DIAGNOSIS — Z85028 Personal history of other malignant neoplasm of stomach: Secondary | ICD-10-CM | POA: Insufficient documentation

## 2019-02-03 DIAGNOSIS — I4891 Unspecified atrial fibrillation: Secondary | ICD-10-CM | POA: Diagnosis not present

## 2019-02-03 DIAGNOSIS — I509 Heart failure, unspecified: Secondary | ICD-10-CM | POA: Diagnosis present

## 2019-02-03 DIAGNOSIS — Z79899 Other long term (current) drug therapy: Secondary | ICD-10-CM | POA: Insufficient documentation

## 2019-02-03 DIAGNOSIS — Z85030 Personal history of malignant carcinoid tumor of large intestine: Secondary | ICD-10-CM | POA: Insufficient documentation

## 2019-02-03 DIAGNOSIS — K219 Gastro-esophageal reflux disease without esophagitis: Secondary | ICD-10-CM | POA: Insufficient documentation

## 2019-02-03 DIAGNOSIS — Z87891 Personal history of nicotine dependence: Secondary | ICD-10-CM | POA: Diagnosis not present

## 2019-02-03 DIAGNOSIS — E785 Hyperlipidemia, unspecified: Secondary | ICD-10-CM | POA: Diagnosis not present

## 2019-02-03 DIAGNOSIS — Z923 Personal history of irradiation: Secondary | ICD-10-CM | POA: Insufficient documentation

## 2019-02-03 DIAGNOSIS — Z7901 Long term (current) use of anticoagulants: Secondary | ICD-10-CM | POA: Insufficient documentation

## 2019-02-03 DIAGNOSIS — D751 Secondary polycythemia: Secondary | ICD-10-CM | POA: Diagnosis not present

## 2019-02-03 DIAGNOSIS — D3A098 Benign carcinoid tumors of other sites: Secondary | ICD-10-CM

## 2019-02-03 NOTE — Patient Instructions (Addendum)
Continue weighing daily and call for an overnight weight gain of > 2 pounds or a weekly weight gain of >5 pounds.  For the next 2 days, take an additional fluid pill (furosemide) along with an additional potassium pill

## 2019-02-07 ENCOUNTER — Institutional Professional Consult (permissible substitution): Admit: 2019-02-07 | Discharge: 2019-02-08 | Payer: MEDICARE

## 2019-02-07 DIAGNOSIS — C7B8 Other secondary neuroendocrine tumors: Secondary | ICD-10-CM

## 2019-02-07 DIAGNOSIS — C7A8 Other malignant neuroendocrine tumors: Principal | ICD-10-CM

## 2019-02-07 DIAGNOSIS — E340 Carcinoid syndrome: Secondary | ICD-10-CM

## 2019-02-12 NOTE — Progress Notes (Deleted)
Patient ID: Veronica Wang, female    DOB: October 31, 1935, 83 y.o.   MRN: [id number]  HPI  Veronica Wang is a 83 y/o female with a history of liver, stomach and intestinal cancer, hyperlipidemia, HTN, GERD, COPD, atrial fibrillation, macular degeneration, previous tobacco use and chronic heart failure.   Echo report from 10/27/2018 reviewed and showed an EF of 20-25% along with moderate/severe MR. Echo report 04/17/17 reviewed and showed an EF of 50% along with mild AR/MR/TR.  Admitted 10/26/2018 due to acute HF exacerbation. Cardiology consult done. COVID test negative. Cardizem bolus given due to atrial fibrillation. Discharged after 4 days.   She presents today for a follow-up visit with a chief complaint of     Past Medical History:  Diagnosis Date  . Abnormal MRI 09/10/2015  . Atrial fibrillation ([place])   . Cancer ([place])    Liver  . Cancer ([place])    stomach  . Carcinoid tumor of intestine   . CHF (congestive heart failure) ([place])   . COPD (chronic obstructive pulmonary disease) ([place])   . FH: chemotherapy   . GERD (gastroesophageal reflux disease)   . Hyperlipidemia   . Hypertension   . Macular degeneration   . Polycythemia   . Radiation    Past Surgical History:  Procedure Laterality Date  . CATARACT EXTRACTION  splenectomy  . COLON RESECTION    . COLON SURGERY    . GASTRECTOMY    . SPLENECTOMY     Family History  Problem Relation Age of Onset  . Heart attack Sister   . Heart attack Brother   . Leukemia Mother    Social History   Tobacco Use  . Smoking status: Former Smoker    Types: Cigarettes  . Smokeless tobacco: Never Used  Substance Use Topics  . Alcohol use: No    Alcohol/week: 0.0 standard drinks   Allergies  Allergen Reactions  . No Known Allergies      Review of Systems  Constitutional: Positive for fatigue (tire easily). Negative for appetite change.  HENT: Negative for congestion, postnasal drip and sore throat.   Eyes: Negative.   Respiratory:  Positive for shortness of breath (minimal). Negative for chest tightness.   Cardiovascular: Positive for chest pain (across left upper chest at times) and leg swelling. Negative for palpitations.  Gastrointestinal: Positive for diarrhea (due to intestional cancer). Negative for abdominal distention.  Endocrine: Negative.   Genitourinary: Negative.   Musculoskeletal: Negative for arthralgias, back pain and neck pain.  Skin: Negative.   Allergic/Immunologic: Negative.   Neurological: Negative for dizziness and light-headedness.  Hematological: Negative for adenopathy. Does not bruise/bleed easily.  Psychiatric/Behavioral: Negative for dysphoric mood and sleep disturbance. The patient is not nervous/anxious.       Physical Exam Vitals signs and nursing note reviewed.  Constitutional:      Appearance: She is well-developed. She is cachectic.  HENT:     Head: Normocephalic and atraumatic.  Neck:     Musculoskeletal: Normal range of motion and neck supple.     Vascular: No JVD.  Cardiovascular:     Rate and Rhythm: Normal rate and regular rhythm.  Pulmonary:     Effort: Pulmonary effort is normal. No respiratory distress.     Breath sounds: No wheezing or rales.  Abdominal:     General: There is no distension.     Palpations: Abdomen is soft.  Musculoskeletal:     Right lower leg: She exhibits no tenderness. Edema (trace pitting) present.  Left lower leg: She exhibits no tenderness. Edema (1+ pitting) present.  Skin:    General: Skin is warm and dry.  Neurological:     Mental Status: She is alert and oriented to person, place, and time.  Psychiatric:        Behavior: Behavior normal.    Assessment & Plan:  1: Chronic heart failure with preserved ejection fraction- - NYHA class III - mildly fluid overloaded today with increased weight and pedal edema - weighing daily and says that her weight has been stable. Reminded to call for an overnight weight gain of >2 pounds or a  weekly weight gain of >5 pounds - weight 72 pounds from last visit here 1 week ago - not adding salt to her food and tries to keep daily sodium intake to 2000mg  daily; she does admit that she's been eating saltier foods like shrimp as her family has moved in with her and is now doing the cooking and "they like salt" - potassium/ furosemide was doubled for a couple of days last week - put on her compression socks first thing in the morning and remove them at bedtime - saw cardiology ([place]) 11/19/2018 - had telemedicine visit with PCP Caryl Comes) 12/19/2018 - BMP 12/19/2018 reviewed and showed sodium 144, potassium 4.1, creatinine 0.8 and GFR 69 - BNP 10/28/2018 was 1100.0  2: Carcinoid tumor of intestine- - saw oncologist Grayland Ormond) 04/12/18 - had telemedicine visit with hematologist Altamease Oiler) 01/27/2019 - she says that she returns to the cancer center later this week  Patient did not bring her medications nor a list. Each medication was verbally reviewed with the patient and she was encouraged to bring the bottles to every visit to confirm accuracy of list.

## 2019-02-13 ENCOUNTER — Ambulatory Visit: Payer: Medicare Other | Admitting: Family

## 2019-02-19 NOTE — Progress Notes (Signed)
Patient ID: Veronica Wang, female    DOB: Jan 03, 1936, 83 y.o.   MRN: [id number]  HPI  Veronica Wang is a 83 y/o female with a history of liver, stomach and intestinal cancer, hyperlipidemia, HTN, GERD, COPD, atrial fibrillation, macular degeneration, previous tobacco use and chronic heart failure.   Echo report from 10/27/2018 reviewed and showed an EF of 20-25% along with moderate/severe MR. Echo report 04/17/17 reviewed and showed an EF of 50% along with mild AR/MR/TR.  Admitted 10/26/2018 due to acute HF exacerbation. Cardiology consult done. COVID test negative. Cardizem bolus given due to atrial fibrillation. Discharged after 4 days.   She presents today for a follow-up visit with a chief complaint of moderate fatigue upon minimal exertion. She describes this as chronic in nature having been present for several years. She has associated shortness of breath, pedal edema and chronic diarrhea along with this. She denies any difficulty sleeping, abdominal distention, palpitations, chest pain or dizziness.   Says that she's been wearing her compression socks but doesn't have them on today and also hasn't taken her diuretic yet today. She says that she's in the process of moving into her own place and hasn't been elevating her legs like she should be.   Past Medical History:  Diagnosis Date  . Abnormal MRI 09/10/2015  . Atrial fibrillation ([place])   . Cancer ([place])    Liver  . Cancer ([place])    stomach  . Carcinoid tumor of intestine   . CHF (congestive heart failure) ([place])   . COPD (chronic obstructive pulmonary disease) ([place])   . FH: chemotherapy   . GERD (gastroesophageal reflux disease)   . Hyperlipidemia   . Hypertension   . Macular degeneration   . Polycythemia   . Radiation    Past Surgical History:  Procedure Laterality Date  . CATARACT EXTRACTION  splenectomy  . COLON RESECTION    . COLON SURGERY    . GASTRECTOMY    . SPLENECTOMY     Family History  Problem Relation Age of  Onset  . Heart attack Sister   . Heart attack Brother   . Leukemia Mother    Social History   Tobacco Use  . Smoking status: Former Smoker    Types: Cigarettes  . Smokeless tobacco: Never Used  Substance Use Topics  . Alcohol use: No    Alcohol/week: 0.0 standard drinks   Allergies  Allergen Reactions  . No Known Allergies    Prior to Admission medications   Medication Sig Start Date End Date Taking? Authorizing Provider  acetaminophen (TYLENOL) 325 MG tablet Take 2 tablets (650 mg total) by mouth every 6 (six) hours as needed for mild pain (or Fever >/= 101). 12/15/14  Yes Tama High III, MD  albuterol (PROVENTIL HFA;VENTOLIN HFA) 108 (90 BASE) MCG/ACT inhaler Inhale 2 puffs into the lungs every 6 (six) hours as needed for wheezing or shortness of breath.   Yes [provider]  atorvastatin (LIPITOR) 40 MG tablet Take 1 tablet (40 mg total) by mouth daily at 6 PM. 08/15/18  Yes Mayo, Pete Pelt, MD  Cholecalciferol (VITAMIN D-1000 MAX ST) 1000 UNITS tablet Take 1,000 Units by mouth daily.    Yes [provider]  Cyanocobalamin (VITAMIN B-12 CR) 1000 MCG TBCR Take 1 tablet by mouth daily.  03/02/11  Yes [provider]  dabigatran (PRADAXA) 75 MG CAPS capsule Take 1 capsule (75 mg total) by mouth 2 (two) times daily. 11/04/18  Yes Larke,  Aura Fey, FNP  famotidine (PEPCID) 20 MG tablet Take 20 mg by mouth daily.    Yes [provider]  feeding supplement (BOOST HIGH PROTEIN) LIQD Take 1 Container by mouth 3 (three) times daily between meals.   Yes [provider]  furosemide (LASIX) 20 MG tablet Take 40 mg by mouth daily.  09/02/18  Yes [provider]  LANREOTIDE ACETATE [place] Inject into the skin.   Yes [provider]  lidocaine (XYLOCAINE) 2 % solution Use as directed 15 mLs in the mouth or throat every 6 (six) hours as needed (chest/stomach pain). 07/18/18  Yes Nance Pear, MD  lisinopril (PRINIVIL,ZESTRIL) 5 MG tablet Take  5 mg by mouth daily. 06/18/18  Yes [provider]  loperamide (IMODIUM A-D) 2 MG tablet Take 4 mg by mouth 4 (four) times daily as needed for diarrhea or loose stools.   Yes [provider]  Melatonin 5 MG TABS Take 1 tablet (5 mg total) by mouth at bedtime. 10/30/18  Yes Gouru, Illene Silver, MD  metoprolol tartrate (LOPRESSOR) 25 MG tablet Take 1 tablet (25 mg total) by mouth 2 (two) times daily. 11/04/18  Yes Abdalrahman Clementson, Aura Fey, FNP  Multiple Vitamins-Minerals (CENTRUM SILVER) tablet Take 1 tablet by mouth daily.  03/02/11  Yes [provider]  octreotide (SANDOSTATIN) 100 MCG/ML SOLN injection Inject 100 mcg into the skin every 30 (thirty) days. 05/11/18  Yes [provider]  ondansetron (ZOFRAN) 8 MG tablet Take 1 tablet (8 mg total) by mouth every 8 (eight) hours as needed for nausea or vomiting. 09/17/17  Yes Burns, Wandra Feinstein, NP  potassium chloride (K-DUR) 10 MEQ tablet Take 10 mEq by mouth daily. 09/02/18  Yes [provider]  prochlorperazine (COMPAZINE) 10 MG tablet Take 1 tablet (10 mg total) by mouth every 6 (six) hours as needed for nausea or vomiting. 09/17/17  Yes Burns, Wandra Feinstein, NP  Telotristat Etiprate 250 MG TABS Take 1 tablet by mouth 3 (three) times daily. 05/27/18  Yes [provider]    Review of Systems  Constitutional: Positive for fatigue (tire easily). Negative for appetite change.  HENT: Negative for congestion, postnasal drip and sore throat.   Eyes: Negative.   Respiratory: Positive for shortness of breath (minimal). Negative for chest tightness.   Cardiovascular: Positive for leg swelling. Negative for chest pain and palpitations.  Gastrointestinal: Positive for diarrhea (due to intestional cancer). Negative for abdominal distention.  Endocrine: Negative.   Genitourinary: Negative.   Musculoskeletal: Negative for arthralgias, back pain and neck pain.  Skin: Negative.   Allergic/Immunologic: Negative.   Neurological:  Negative for dizziness and light-headedness.  Hematological: Negative for adenopathy. Does not bruise/bleed easily.  Psychiatric/Behavioral: Negative for dysphoric mood and sleep disturbance. The patient is not nervous/anxious.    Vitals:   02/20/19 1351  BP: 117/81  Pulse: 95  Resp: 18  SpO2: 97%  Weight: 73 lb 8 oz (33.3 kg)  Height: 4\' 9"  (1.448 m)   Wt Readings from Last 3 Encounters:  02/20/19 73 lb 8 oz (33.3 kg)  02/03/19 72 lb (32.7 kg)  11/04/18 83 lb (37.6 kg)   Lab Results  Component Value Date   CREATININE 0.67 10/30/2018   CREATININE 0.56 10/29/2018   CREATININE 0.61 10/28/2018    Physical Exam Vitals signs and nursing note reviewed.  Constitutional:      Appearance: She is well-developed. She is cachectic.  HENT:     Head: Normocephalic and atraumatic.  Neck:  Musculoskeletal: Normal range of motion and neck supple.     Vascular: No JVD.  Cardiovascular:     Rate and Rhythm: Normal rate and regular rhythm.  Pulmonary:     Effort: Pulmonary effort is normal. No respiratory distress.     Breath sounds: No wheezing or rales.  Abdominal:     General: There is no distension.     Palpations: Abdomen is soft.  Musculoskeletal:     Right lower leg: She exhibits no tenderness. Edema (trace pitting) present.     Left lower leg: She exhibits no tenderness. Edema (1+ pitting) present.  Skin:    General: Skin is warm and dry.  Neurological:     Mental Status: She is alert and oriented to person, place, and time.  Psychiatric:        Behavior: Behavior normal.    Assessment & Plan:  1: Chronic heart failure with preserved ejection fraction- - NYHA class III - euvolemic today - weighing daily; Reminded to call for an overnight weight gain of >2 pounds or a weekly weight gain of >5 pounds - weight stable from last visit here 2 weeks ago - not adding salt to her food and tries to keep daily sodium intake to 2000mg  daily; she has been eating less sodium  since being in her own place; she is hoping to be completely moved in soon - not sure her BP could tolerate entresto; if not, attempt to titrate lisinopril - need to change her metoprolol to once daily formulation - once those medications are maxed, add farxiga 10mg  daily - saw cardiology ([place]) 11/19/2018 - had telemedicine visit with PCP Caryl Comes) 12/19/2018 - BMP 12/19/2018 reviewed and showed sodium 144, potassium 4.1, creatinine 0.8 and GFR 69 - BNP 10/28/2018 was 1100.0  2: Carcinoid tumor of intestine- - saw oncologist Grayland Ormond) 04/12/18 - had telemedicine visit with hematologist Altamease Oiler) 01/27/2019  3: Lymphedema- - stage 2 - wearing compression socks with slight improvement of edema - hasn't been elevating her legs consistently right now but says that soon she'll be moved into her own place and will then be able to rest and elevate her legs whenever she wants to - limited in her ability to exercise due to her fatigue - consider lymphapress compression boots if edema persists  Patient did not bring her medications nor a list. Each medication was verbally reviewed with the patient and she was encouraged to bring the bottles to every visit to confirm accuracy of list.  Return in 2 months or sooner for any questions/problems before then.

## 2019-02-20 ENCOUNTER — Other Ambulatory Visit: Payer: Self-pay

## 2019-02-20 ENCOUNTER — Ambulatory Visit: Payer: Medicare Other | Attending: Family | Admitting: Family

## 2019-02-20 ENCOUNTER — Encounter: Payer: Self-pay | Admitting: Family

## 2019-02-20 VITALS — BP 117/81 | HR 95 | Resp 18 | Ht <= 58 in | Wt 73.5 lb

## 2019-02-20 DIAGNOSIS — I890 Lymphedema, not elsewhere classified: Secondary | ICD-10-CM | POA: Insufficient documentation

## 2019-02-20 DIAGNOSIS — D3A00 Benign carcinoid tumor of unspecified site: Secondary | ICD-10-CM | POA: Insufficient documentation

## 2019-02-20 DIAGNOSIS — H3530 Unspecified macular degeneration: Secondary | ICD-10-CM | POA: Diagnosis not present

## 2019-02-20 DIAGNOSIS — Z79899 Other long term (current) drug therapy: Secondary | ICD-10-CM | POA: Diagnosis not present

## 2019-02-20 DIAGNOSIS — I5032 Chronic diastolic (congestive) heart failure: Secondary | ICD-10-CM | POA: Diagnosis present

## 2019-02-20 DIAGNOSIS — Z8249 Family history of ischemic heart disease and other diseases of the circulatory system: Secondary | ICD-10-CM | POA: Insufficient documentation

## 2019-02-20 DIAGNOSIS — E785 Hyperlipidemia, unspecified: Secondary | ICD-10-CM | POA: Insufficient documentation

## 2019-02-20 DIAGNOSIS — I110 Hypertensive heart disease with heart failure: Secondary | ICD-10-CM | POA: Insufficient documentation

## 2019-02-20 DIAGNOSIS — Z87891 Personal history of nicotine dependence: Secondary | ICD-10-CM | POA: Diagnosis not present

## 2019-02-20 DIAGNOSIS — J449 Chronic obstructive pulmonary disease, unspecified: Secondary | ICD-10-CM | POA: Insufficient documentation

## 2019-02-20 DIAGNOSIS — I4891 Unspecified atrial fibrillation: Secondary | ICD-10-CM | POA: Insufficient documentation

## 2019-02-20 DIAGNOSIS — I5022 Chronic systolic (congestive) heart failure: Secondary | ICD-10-CM

## 2019-02-20 DIAGNOSIS — Z7901 Long term (current) use of anticoagulants: Secondary | ICD-10-CM | POA: Insufficient documentation

## 2019-02-20 DIAGNOSIS — D3A098 Benign carcinoid tumors of other sites: Secondary | ICD-10-CM

## 2019-02-20 DIAGNOSIS — K219 Gastro-esophageal reflux disease without esophagitis: Secondary | ICD-10-CM | POA: Diagnosis not present

## 2019-02-20 NOTE — Patient Instructions (Signed)
Continue weighing daily and call for an overnight weight gain of > 2 pounds or a weekly weight gain of >5 pounds. 

## 2019-02-21 ENCOUNTER — Institutional Professional Consult (permissible substitution): Admit: 2019-02-21 | Discharge: 2019-02-22 | Payer: MEDICARE

## 2019-02-21 DIAGNOSIS — E340 Carcinoid syndrome: Secondary | ICD-10-CM

## 2019-02-21 DIAGNOSIS — C7A8 Other malignant neuroendocrine tumors: Principal | ICD-10-CM

## 2019-02-21 DIAGNOSIS — C7B8 Other secondary neuroendocrine tumors: Secondary | ICD-10-CM

## 2019-03-05 ENCOUNTER — Other Ambulatory Visit: Payer: Self-pay

## 2019-03-05 ENCOUNTER — Other Ambulatory Visit: Payer: Self-pay | Admitting: Internal Medicine

## 2019-03-05 ENCOUNTER — Encounter (INDEPENDENT_AMBULATORY_CARE_PROVIDER_SITE_OTHER): Payer: Self-pay

## 2019-03-05 ENCOUNTER — Ambulatory Visit
Admission: RE | Admit: 2019-03-05 | Discharge: 2019-03-05 | Disposition: A | Discharge status: Home / Self Care | Payer: Medicare Other | Source: Ambulatory Visit | Attending: Internal Medicine | Admitting: Internal Medicine

## 2019-03-05 DIAGNOSIS — R600 Localized edema: Secondary | ICD-10-CM | POA: Diagnosis not present

## 2019-03-06 ENCOUNTER — Institutional Professional Consult (permissible substitution): Admit: 2019-03-06 | Payer: MEDICARE

## 2019-03-07 ENCOUNTER — Institutional Professional Consult (permissible substitution): Admit: 2019-03-07 | Discharge: 2019-03-08 | Payer: MEDICARE

## 2019-03-07 DIAGNOSIS — C7B8 Other secondary neuroendocrine tumors: Secondary | ICD-10-CM

## 2019-03-07 DIAGNOSIS — C7A8 Other malignant neuroendocrine tumors: Principal | ICD-10-CM

## 2019-03-07 DIAGNOSIS — E340 Carcinoid syndrome: Secondary | ICD-10-CM

## 2019-03-12 ENCOUNTER — Other Ambulatory Visit: Payer: Self-pay

## 2019-03-12 ENCOUNTER — Ambulatory Visit
Admission: EM | Admit: 2019-03-12 | Discharge: 2019-03-12 | Disposition: A | Discharge status: Home / Self Care | Payer: Medicare Other | Attending: Emergency Medicine | Admitting: Emergency Medicine

## 2019-03-12 DIAGNOSIS — R300 Dysuria: Secondary | ICD-10-CM | POA: Diagnosis not present

## 2019-03-12 DIAGNOSIS — R310 Gross hematuria: Secondary | ICD-10-CM | POA: Diagnosis not present

## 2019-03-12 LAB — URINALYSIS, COMPLETE (UACMP) WITH MICROSCOPIC
Bacteria, UA: NONE SEEN
Bilirubin Urine: NEGATIVE
Glucose, UA: NEGATIVE mg/dL
Ketones, ur: NEGATIVE mg/dL
Leukocytes,Ua: NEGATIVE
Nitrite: NEGATIVE
Protein, ur: 30 mg/dL — AB
RBC / HPF: >50 RBC/hpf (ref 0–5)
Specific Gravity, Urine: 1.015 (ref 1.005–1.030)
Squamous Epithelial / HPF: NONE SEEN (ref 0–5)
WBC, UA: NONE SEEN WBC/hpf (ref 0–5)
pH: 5.5 (ref 5.0–8.0)

## 2019-03-12 MED ORDER — CEPHALEXIN 500 MG PO CAPS: 500.0000 mg | ORAL_CAPSULE | Freq: Two times a day (BID) | ORAL | 0 refills | Status: AC

## 2019-03-12 NOTE — Discharge Instructions (Addendum)
Take medication as prescribed.please monitor closely.  As discussed, if this is not improving further evaluation is needed.  Will await urine culture.  Follow up with your primary care physician this week. Return to Urgent care or ER for new or worsening concerns.

## 2019-03-12 NOTE — ED Provider Notes (Signed)
[facility] ____________________________________________  Time seen: Approximately 2:49 PM  I have reviewed the triage vital signs and the nursing notes.   HISTORY  Chief Complaint Hematuria  HPI Veronica Wang is a 83 y.o. 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 fib, pulmonary fibrosis, presenting for evaluation of dysuria and hematuria.  Patient reports she has been having urinary frequency approximately 1 week and reports since this past weekend she noticed some blood in her urine with increased burning with urination.  States each time she urinates she is having a lot of burning discomfort with suprapubic pressure.  Denies any other abdominal or back pain.  States similar in the past with urinary infections, but does not remember having blood in her urine.  No recent antibiotic use.  No injury.  No fevers.  Denies changes in chronic shortness of breath or current chest pain.  Continues to eat and drink well.  Adin Hector, MD: PCP   Past Medical History:  Diagnosis Date  . Abnormal MRI 09/10/2015  . Atrial fibrillation ([place])   . Cancer ([place])    Liver  . Cancer ([place])    stomach  . Carcinoid tumor of intestine   . CHF (congestive heart failure) ([place])   . COPD (chronic obstructive pulmonary disease) ([place])   . FH: chemotherapy   . GERD (gastroesophageal reflux disease)   . Hyperlipidemia   . Hypertension   . Macular degeneration   . Polycythemia   . Radiation     Patient Active Problem List   Diagnosis Date Noted  . Acute exacerbation of CHF (congestive heart failure) ([place]) 10/26/2018  . Protein-calorie malnutrition, severe 08/14/2018  . Stroke ([place]) 08/13/2018  . HTN (hypertension) 08/13/2018  . HLD (hyperlipidemia) 08/13/2018  . Chronic combined systolic (congestive) and diastolic (congestive) heart failure ([employer]) 11/27/2017  . PAF (paroxysmal atrial fibrillation) ([place]) 11/27/2017  . Abnormal MRI  09/10/2015  . Malnutrition of moderate degree ([place]) 12/06/2014  . Pneumonia 12/05/2014  . COPD (chronic obstructive pulmonary disease) ([place]) 12/05/2014  . 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 CATARACT EXTRACTION  splenectomy  . COLON RESECTION    . COLON SURGERY    . GASTRECTOMY    . SPLENECTOMY       No current facility-administered medications for this encounter.   Current Outpatient Medications:  .  acetaminophen (TYLENOL) 325 MG tablet, Take 2 tablets (650 mg total) by mouth every 6 (six) hours as needed for mild pain (or Fever >/= 101)., Disp: 30 tablet, Rfl: 1 .  albuterol (PROVENTIL HFA;VENTOLIN HFA) 108 (90 BASE) MCG/ACT inhaler, Inhale 2 puffs into the lungs every 6 (six) hours as needed for wheezing or shortness of breath., Disp: , Rfl:  .  Cholecalciferol (VITAMIN D-1000 MAX ST) 1000 UNITS tablet, Take 1,000 Units by mouth daily. , Disp: , Rfl:  .  Cyanocobalamin (VITAMIN B-12 CR) 1000 MCG TBCR, Take 1 tablet by mouth daily. , Disp: , Rfl:  .  dabigatran (PRADAXA) 75 MG CAPS capsule, Take 1 capsule (75 mg total) by mouth 2 (two) times daily., Disp: 60 capsule, Rfl: 5 .  famotidine (PEPCID) 20 MG tablet, Take 20 mg by mouth daily. , Disp: , Rfl:  .  feeding supplement (BOOST HIGH PROTEIN) LIQD, Take 1 Container by mouth 3 (three) times daily between meals., Disp: , Rfl:  .  furosemide (LASIX) 20 MG tablet, Take 40  mg by mouth daily. , Disp: , Rfl:  .  LANREOTIDE ACETATE [place], Inject into the skin., Disp: , Rfl:  .  lidocaine (XYLOCAINE) 2 % solution, Use as directed 15 mLs in the mouth or throat every 6 (six) hours as needed (chest/stomach pain)., Disp: 100 mL, Rfl: 0 .  lisinopril (PRINIVIL,ZESTRIL) 5 MG tablet, Take 5 mg by mouth daily., Disp: , Rfl:  .  loperamide (IMODIUM A-D) 2 MG tablet, Take 4 mg by mouth 4 (four) times daily as needed for diarrhea or loose stools., Disp: , Rfl:  .  metoprolol tartrate  (LOPRESSOR) 25 MG tablet, Take 1 tablet (25 mg total) by mouth 2 (two) times daily., Disp: 60 tablet, Rfl: 5 .  Multiple Vitamins-Minerals (CENTRUM SILVER) tablet, Take 1 tablet by mouth daily. , Disp: , Rfl:  .  octreotide (SANDOSTATIN) 100 MCG/ML SOLN injection, Inject 100 mcg into the skin every 30 (thirty) days., Disp: , Rfl:  .  ondansetron (ZOFRAN) 8 MG tablet, Take 1 tablet (8 mg total) by mouth every 8 (eight) hours as needed for nausea or vomiting., Disp: 30 tablet, Rfl: 0 .  potassium chloride (K-DUR) 10 MEQ tablet, Take 10 mEq by mouth daily., Disp: , Rfl:  .  prochlorperazine (COMPAZINE) 10 MG tablet, Take 1 tablet (10 mg total) by mouth every 6 (six) hours as needed for nausea or vomiting., Disp: 30 tablet, Rfl: 0 .  Telotristat Etiprate 250 MG TABS, Take 1 tablet by mouth 3 (three) times daily., Disp: , Rfl:  .  atorvastatin (LIPITOR) 40 MG tablet, Take 1 tablet (40 mg total) by mouth daily at 6 PM., Disp: 30 tablet, Rfl: 0 .  cephALEXin (KEFLEX) 500 MG capsule, Take 1 capsule (500 mg total) by mouth 2 (two) times daily for 7 days., Disp: 14 capsule, Rfl: 0  Facility-Administered Medications Ordered in Other Encounters:  .  0.9 %  sodium chloride infusion, , Intravenous, Continuous, Burns, Wandra Feinstein, NP, Stopped at 09/17/17 1630  Allergies No known allergies  Family History  Problem Relation Age of Onset  . Heart attack Sister   . Heart attack Brother   . Leukemia Mother     Social History Social History   Tobacco Use  . Smoking status: Former Smoker    Types: Cigarettes  . Smokeless tobacco: Never Used  Substance Use Topics  . Alcohol use: No    Alcohol/week: 0.0 standard drinks  . Drug use: No    Review of Systems Constitutional: No fever. Cardiovascular: Denies chest pain. Respiratory: Denies shortness of breath. Gastrointestinal:No nausea, no vomiting.  No diarrhea.  Genitourinary: positive for dysuria. Musculoskeletal: Negative for back pain. Skin:  Negative for rash.   ____________________________________________   PHYSICAL EXAM:  VITAL SIGNS: ED Triage Vitals  Enc Vitals Group     BP 03/12/19 1355 129/81     Pulse Rate 03/12/19 1355 95     Resp 03/12/19 1355 18     Temp 03/12/19 1355 97.6 F (36.4 C)     Temp Source 03/12/19 1355 Oral     SpO2 03/12/19 1355 94 %     Weight 03/12/19 1355 75 lb (34 kg)     Height --      Head Circumference --      Peak Flow --      Pain Score 03/12/19 1353 6     Pain Loc --      Pain Edu? --      Excl. in [place]? --  Constitutional: Alert and oriented. Well appearing and in no acute distress. Eyes: Conjunctivae are normal.  ENT      Head: Normocephalic and atraumatic. Cardiovascular: Normal rate, regular rhythm. Grossly normal heart sounds.  Good peripheral circulation. Respiratory: Normal respiratory effort without tachypnea nor retractions. Breath sounds are clear and equal bilaterally. No wheezes, rales, rhonchi.  Home oxygen present by nasal cannula. Gastrointestinal: Mild midline suprapubic tenderness to palpation.  Nondistended.  Abdomen otherwise soft and nontender.  No CVA tenderness. Musculoskeletal: Steady gait. Neurologic:  Normal speech and language. Speech is normal.  Skin:  Skin is warm, dry. Psychiatric: Mood and affect are normal. Speech and behavior are normal. Patient exhibits appropriate insight and judgment   ___________________________________________   LABS (all labs ordered are listed, but only abnormal results are displayed)  Labs Reviewed  URINALYSIS, COMPLETE (UACMP) WITH MICROSCOPIC - Abnormal; Notable for the following components:      Result Value   Color, Urine RED (*)    APPearance CLOUDY (*)    Hgb urine dipstick LARGE (*)    Protein, ur 30 (*)    All other components within normal limits  URINE CULTURE   ____________________________________________   PROCEDURES Procedures    INITIAL IMPRESSION / ASSESSMENT AND PLAN / ED COURSE   Pertinent labs & imaging results that were available during my care of the patient were reviewed by me and considered in my medical decision making (see chart for details).  well-appearing patient.  Patient with burning with urination and urinary frequency and now with gross hematuria.  No flank pain or other abdominal pain.  Urinalysis reviewed, gross hematuria and concern for infection.  Will urine culture and empirically start on oral Keflex.  Strongly encourage close PCP follow-up and monitoring of symptoms. Discussed indication, risks and benefits of medications with patient.  Discussed follow up with Primary care physician this week. Discussed follow up and return parameters including no resolution or any worsening concerns. Patient verbalized understanding and agreed to plan.   ____________________________________________   FINAL CLINICAL IMPRESSION(S) / ED DIAGNOSES  Final diagnoses:  Gross hematuria  Dysuria     ED Discharge Orders         Ordered    cephALEXin (KEFLEX) 500 MG capsule  2 times daily     03/12/19 1440           Note: This dictation was prepared with Dragon dictation along with smaller phrase technology. Any transcriptional errors that result from this process are unintentional.         Marylene Land, NP 03/12/19 1528

## 2019-03-12 NOTE — ED Triage Notes (Signed)
Patient states that she noticed blood in urine over the weekend and painful urination. Patient states that she has been having urinary frequency for a while.

## 2019-03-14 LAB — URINE CULTURE: Culture: 80000 — AB

## 2019-03-17 ENCOUNTER — Emergency Department: Payer: Medicare Other

## 2019-03-17 ENCOUNTER — Other Ambulatory Visit: Payer: Self-pay

## 2019-03-17 ENCOUNTER — Emergency Department
Admission: EM | Admit: 2019-03-17 | Discharge: 2019-03-17 | Disposition: A | Discharge status: Home / Self Care | Payer: Medicare Other | Attending: Emergency Medicine | Admitting: Emergency Medicine

## 2019-03-17 DIAGNOSIS — Z79899 Other long term (current) drug therapy: Secondary | ICD-10-CM | POA: Insufficient documentation

## 2019-03-17 DIAGNOSIS — R350 Frequency of micturition: Secondary | ICD-10-CM | POA: Diagnosis present

## 2019-03-17 DIAGNOSIS — I110 Hypertensive heart disease with heart failure: Secondary | ICD-10-CM | POA: Insufficient documentation

## 2019-03-17 DIAGNOSIS — R609 Edema, unspecified: Secondary | ICD-10-CM

## 2019-03-17 DIAGNOSIS — Z87891 Personal history of nicotine dependence: Secondary | ICD-10-CM | POA: Insufficient documentation

## 2019-03-17 DIAGNOSIS — R600 Localized edema: Secondary | ICD-10-CM | POA: Diagnosis not present

## 2019-03-17 DIAGNOSIS — R300 Dysuria: Secondary | ICD-10-CM

## 2019-03-17 DIAGNOSIS — Z85020 Personal history of malignant carcinoid tumor of stomach: Secondary | ICD-10-CM | POA: Insufficient documentation

## 2019-03-17 DIAGNOSIS — I5042 Chronic combined systolic (congestive) and diastolic (congestive) heart failure: Secondary | ICD-10-CM | POA: Diagnosis not present

## 2019-03-17 DIAGNOSIS — Z8505 Personal history of malignant neoplasm of liver: Secondary | ICD-10-CM | POA: Insufficient documentation

## 2019-03-17 DIAGNOSIS — N1330 Unspecified hydronephrosis: Secondary | ICD-10-CM | POA: Diagnosis not present

## 2019-03-17 LAB — URINALYSIS, COMPLETE (UACMP) WITH MICROSCOPIC
Bacteria, UA: NONE SEEN
Bilirubin Urine: NEGATIVE
Glucose, UA: NEGATIVE mg/dL
Ketones, ur: NEGATIVE mg/dL
Leukocytes,Ua: NEGATIVE
Nitrite: NEGATIVE
Protein, ur: 100 mg/dL — AB
RBC / HPF: >50 RBC/hpf — ABNORMAL HIGH (ref 0–5)
Specific Gravity, Urine: 1.025 (ref 1.005–1.030)
WBC, UA: >50 WBC/hpf — ABNORMAL HIGH (ref 0–5)
pH: 6 (ref 5.0–8.0)

## 2019-03-17 LAB — CBC
HCT: 39.7 % (ref 36.0–46.0)
Hemoglobin: 12.6 g/dL (ref 12.0–15.0)
MCH: 25.3 pg — ABNORMAL LOW (ref 26.0–34.0)
MCHC: 31.7 g/dL (ref 30.0–36.0)
MCV: 79.7 fL — ABNORMAL LOW (ref 80.0–100.0)
Platelets: 312 10*3/uL (ref 150–400)
RBC: 4.98 MIL/uL (ref 3.87–5.11)
RDW: 15.9 % — ABNORMAL HIGH (ref 11.5–15.5)
WBC: 10.5 10*3/uL (ref 4.0–10.5)
nRBC: 0 % (ref 0.0–0.2)

## 2019-03-17 LAB — BASIC METABOLIC PANEL
Anion gap: 10 (ref 5–15)
BUN: 20 mg/dL (ref 8–23)
CO2: 25 mmol/L (ref 22–32)
Calcium: 8.7 mg/dL — ABNORMAL LOW (ref 8.9–10.3)
Chloride: 103 mmol/L (ref 98–111)
Creatinine, Ser: 0.83 mg/dL (ref 0.44–1.00)
GFR calc Af Amer: >60 mL/min (ref >60)
GFR calc non Af Amer: >60 mL/min (ref >60)
Glucose, Bld: 99 mg/dL (ref 70–99)
Potassium: 4.6 mmol/L (ref 3.5–5.1)
Sodium: 138 mmol/L (ref 135–145)

## 2019-03-17 LAB — BRAIN NATRIURETIC PEPTIDE: B Natriuretic Peptide: 2597 pg/mL — ABNORMAL HIGH (ref 0.0–100.0)

## 2019-03-17 MED ORDER — PHENAZOPYRIDINE HCL 100 MG PO TABS: 100.0000 mg | ORAL_TABLET | Freq: Two times a day (BID) | ORAL | 0 refills | Status: DC | PRN

## 2019-03-17 MED ORDER — CEPHALEXIN 500 MG PO CAPS: 500.0000 mg | ORAL_CAPSULE | Freq: Two times a day (BID) | ORAL | 0 refills | Status: DC

## 2019-03-17 MED ORDER — CEPHALEXIN 500 MG PO CAPS
500.0000 mg | ORAL_CAPSULE | Freq: Once | ORAL | Status: AC
  Administered 2019-03-17: 14:00:00 500 mg via ORAL
  Filled 2019-03-17: qty 1

## 2019-03-17 MED ORDER — ACETAMINOPHEN 500 MG PO TABS
500.0000 mg | ORAL_TABLET | Freq: Once | ORAL | Status: AC
  Administered 2019-03-17: 10:00:00 500 mg via ORAL
  Filled 2019-03-17: qty 1

## 2019-03-17 MED ORDER — PHENAZOPYRIDINE HCL 100 MG PO TABS
100.0000 mg | ORAL_TABLET | Freq: Once | ORAL | Status: AC
  Administered 2019-03-17: 100 mg via ORAL
  Filled 2019-03-17: qty 1

## 2019-03-17 MED ORDER — FUROSEMIDE 40 MG PO TABS
40.0000 mg | ORAL_TABLET | Freq: Once | ORAL | Status: AC
  Administered 2019-03-17: 14:00:00 40 mg via ORAL
  Filled 2019-03-17: qty 1

## 2019-03-17 NOTE — ED Provider Notes (Signed)
[facility] Emergency Department Provider Note   ____________________________________________   First MD Initiated Contact with Patient 03/17/19 [phone]     (approximate)  I have reviewed the triage vital signs and the nursing notes.   HISTORY  Chief Complaint Leg Swelling and Urinary Frequency    HPI Veronica Wang is a 83 y.o. female here for evaluation for swelling in both legs and ongoing discomfort with urination  Patient reports that for about 2 weeks she has been having pain and burning with urination, she was seen at urgent care and  started antibiotic.  Her symptoms of burning with urination remain, but are starting to improve.  No fevers no chills no nausea vomiting.  She also reports she is has swelling in both legs, she has had blistering over both lower legs and she has been drinking a lot of fluid because she has a urinary tract infection and feels like that swelling is increased somewhat over the last few days  No chest pain no trouble breathing.  No shortness of breath.  Has a couple days of cephalexin remaining.  Past Medical History:  Diagnosis Date  . Abnormal MRI 09/10/2015  . Atrial fibrillation ([place])   . Cancer ([place])    Liver  . Cancer ([place])    stomach  . Carcinoid tumor of intestine   . CHF (congestive heart failure) ([place])   . COPD (chronic obstructive pulmonary disease) ([place])   . FH: chemotherapy   . GERD (gastroesophageal reflux disease)   . Hyperlipidemia   . Hypertension   . Macular degeneration   . Polycythemia   . Radiation     Patient Active Problem List   Diagnosis Date Noted  . Acute exacerbation of CHF (congestive heart failure) ([place]) 10/26/2018  . Protein-calorie malnutrition, severe 08/14/2018  . Stroke ([place]) 08/13/2018  . HTN (hypertension) 08/13/2018  . HLD (hyperlipidemia) 08/13/2018  . Chronic combined systolic (congestive) and diastolic (congestive) heart failure ([place]) 11/27/2017  . PAF (paroxysmal  atrial fibrillation) ([place]) 11/27/2017  . Abnormal MRI 09/10/2015  . Malnutrition of moderate degree ([place]) 12/06/2014  . Pneumonia 12/05/2014  . COPD (chronic obstructive pulmonary disease) ([place]) 12/05/2014  . 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 CATARACT EXTRACTION  splenectomy  . COLON RESECTION    . COLON SURGERY    . GASTRECTOMY    . SPLENECTOMY      Prior to Admission medications   Medication Sig Start Date End Date Taking? Authorizing Provider  acetaminophen (TYLENOL) 325 MG tablet Take 2 tablets (650 mg total) by mouth every 6 (six) hours as needed for mild pain (or Fever >/= 101). 12/15/14   Tama High III, MD  albuterol (PROVENTIL HFA;VENTOLIN HFA) 108 (90 BASE) MCG/ACT inhaler Inhale 2 puffs into the lungs every 6 (six) hours as needed for wheezing or shortness of breath.    [provider]  atorvastatin (LIPITOR) 40 MG tablet Take 1 tablet (40 mg total) by mouth daily at 6 PM. 08/15/18   Mayo, Pete Pelt, MD  cephALEXin (KEFLEX) 500 MG capsule Take 1 capsule (500 mg total) by mouth 2 (two) times daily for 7 days. 03/12/19 03/19/19  Marylene Land, NP  cephALEXin (KEFLEX) 500 MG capsule Take 1 capsule (500 mg total) by mouth 2 (two) times daily. 03/17/19   Delman Kitten, MD  Cholecalciferol (VITAMIN D-1000 MAX ST) 1000 UNITS tablet Take 1,000 Units by mouth daily.  [provider]  Cyanocobalamin (VITAMIN B-12 CR) 1000 MCG TBCR Take 1 tablet by mouth daily.  03/02/11   [provider]  dabigatran (PRADAXA) 75 MG CAPS capsule Take 1 capsule (75 mg total) by mouth 2 (two) times daily. 11/04/18   Alisa Graff, FNP  famotidine (PEPCID) 20 MG tablet Take 20 mg by mouth daily.     [provider]  feeding supplement (BOOST HIGH PROTEIN) LIQD Take 1 Container by mouth 3 (three) times daily between meals.    [provider]  furosemide (LASIX) 20 MG tablet Take 40 mg by mouth daily.   09/02/18   [provider]  LANREOTIDE ACETATE [place] Inject into the skin.    [provider]  lidocaine (XYLOCAINE) 2 % solution Use as directed 15 mLs in the mouth or throat every 6 (six) hours as needed (chest/stomach pain). 07/18/18   Nance Pear, MD  lisinopril (PRINIVIL,ZESTRIL) 5 MG tablet Take 5 mg by mouth daily. 06/18/18   [provider]  loperamide (IMODIUM A-D) 2 MG tablet Take 4 mg by mouth 4 (four) times daily as needed for diarrhea or loose stools.    [provider]  metoprolol tartrate (LOPRESSOR) 25 MG tablet Take 1 tablet (25 mg total) by mouth 2 (two) times daily. 11/04/18   Alisa Graff, FNP  Multiple Vitamins-Minerals (CENTRUM SILVER) tablet Take 1 tablet by mouth daily.  03/02/11   [provider]  octreotide (SANDOSTATIN) 100 MCG/ML SOLN injection Inject 100 mcg into the skin every 30 (thirty) days. 05/11/18   [provider]  ondansetron (ZOFRAN) 8 MG tablet Take 1 tablet (8 mg total) by mouth every 8 (eight) hours as needed for nausea or vomiting. 09/17/17   Jacquelin Hawking, NP  phenazopyridine (PYRIDIUM) 100 MG tablet Take 1 tablet (100 mg total) by mouth 2 (two) times daily as needed for pain (dysuria). 03/17/19 03/16/20  Delman Kitten, MD  potassium chloride (K-DUR) 10 MEQ tablet Take 10 mEq by mouth daily. 09/02/18   [provider]  prochlorperazine (COMPAZINE) 10 MG tablet Take 1 tablet (10 mg total) by mouth every 6 (six) hours as needed for nausea or vomiting. 09/17/17   Jacquelin Hawking, NP  Telotristat Etiprate 250 MG TABS Take 1 tablet by mouth 3 (three) times daily. 05/27/18   [provider]    Allergies No known allergies  Family History  Problem Relation Age of Onset  . Heart attack Sister   . Heart attack Brother   . Leukemia Mother     Social History Social History   Tobacco Use  . Smoking status: Former Smoker    Types: Cigarettes  . Smokeless tobacco: Never Used  Substance  Use Topics  . Alcohol use: No    Alcohol/week: 0.0 standard drinks  . Drug use: No    Review of Systems Constitutional: No fever/chills Eyes: No visual changes. ENT: No sore throat. Cardiovascular: Denies chest pain. Respiratory: Denies shortness of breath. Gastrointestinal: No abdominal pain.  Has frequent loose stools due to her carcinoid, no changes Genitourinary: Dysuria.  Some lower abdominal discomfort in the area of her bladder is improving Musculoskeletal: Negative for back pain. Skin: Negative for rash. Neurological: Negative for headaches, areas of focal weakness or numbness.    ____________________________________________   PHYSICAL EXAM:  VITAL SIGNS: ED Triage Vitals [03/17/19 0911]  Enc Vitals Group     BP 110/67     Pulse Rate 94     Resp (!)  21     Temp 98 F (36.7 C)     Temp Source Oral     SpO2 96 %     Weight 75 lb (34 kg)     Height 4\' 9"  (1.448 m)     Head Circumference      Peak Flow      Pain Score 6     Pain Loc      Pain Edu?      Excl. in [place]?     Constitutional: Alert and oriented. Well appearing and in no acute distress. Eyes: Conjunctivae are normal. Head: Atraumatic. Nose: No congestion/rhinnorhea. Mouth/Throat: Mucous membranes are moist. Neck: No stridor.  Cardiovascular: Normal rate, regular rhythm. Grossly normal heart sounds.  Good peripheral circulation. Respiratory: Normal respiratory effort.  No retractions. Lungs CTAB.  On home oxygen Gastrointestinal: Soft and nontender. No distention. Musculoskeletal: No lower extremity tenderness but has bilateral moderate lower extremity edema, also has some small water blisters over the left leg, none of which have popped and there is no surrounding erythema or cellulitis. Neurologic:  Normal speech and language. No gross focal neurologic deficits are appreciated.  Skin:  Skin is warm, dry and intact. No rash noted. Psychiatric: Mood and affect are normal. Speech and behavior are  normal.  ____________________________________________   LABS (all labs ordered are listed, but only abnormal results are displayed)  Labs Reviewed  URINALYSIS, COMPLETE (UACMP) WITH MICROSCOPIC - Abnormal; Notable for the following components:      Result Value   Color, Urine AMBER (*)    APPearance CLOUDY (*)    Hgb urine dipstick LARGE (*)    Protein, ur 100 (*)    RBC / HPF >50 (*)    WBC, UA >50 (*)    All other components within normal limits  CBC - Abnormal; Notable for the following components:   MCV 79.7 (*)    MCH 25.3 (*)    RDW 15.9 (*)    All other components within normal limits  BASIC METABOLIC PANEL - Abnormal; Notable for the following components:   Calcium 8.7 (*)    All other components within normal limits  BRAIN NATRIURETIC PEPTIDE - Abnormal; Notable for the following components:   B Natriuretic Peptide 2,597.0 (*)    All other components within normal limits  URINE CULTURE   ____________________________________________  EKG   ____________________________________________  RADIOLOGY  Dg Chest Portable 1 View  Result Date: 03/17/2019 CLINICAL DATA:  Urinary frequency, burning and hematuria since 03/12/2019. EXAM: PORTABLE CHEST 1 VIEW COMPARISON:  Single-view of the chest 10/28/2018 and 10/26/2018. PA and lateral chest 09/30/2014. FINDINGS: Chronic left basilar atelectasis is identified. The lungs are emphysematous. No pneumothorax or pleural effusion. Cardiomegaly. Atherosclerosis. IMPRESSION: No acute disease. Emphysema. Cardiomegaly. Atherosclerosis. Electronically Signed   By: Inge Rise M.D.   On: 03/17/2019 11:46   Ct Renal Stone Study  Result Date: 03/17/2019 CLINICAL DATA:  Hematuria unknown cause. Urinary frequency, burning, and hematuria since 03/12/2019. Patient is currently taking antibiotic with urinary tract infection. Blisters and swelling LEFT LOWER extremity for 2 months. Wears oxygen at home as needed. History of COPD. Previous  colonic section, splenectomy. History of liver and stomach cancer. EXAM: CT ABDOMEN AND PELVIS WITHOUT CONTRAST TECHNIQUE: Multidetector CT imaging of the abdomen and pelvis was performed following the standard protocol without IV contrast. COMPARISON:  07/18/2018 FINDINGS: Lower chest: Motion degraded images of the LOWER lungs. There is consolidation and small LEFT pleural effusion at the MEDIAL LEFT  lung base. The heart is markedly enlarged and appears more prominent compared with the prior study. Coronary artery calcifications are partially imaged. Hepatobiliary: There is a 2.7 centimeter low-attenuation lesion within the dome of the RIGHT hepatic lobe. Indistinct 3.5 centimeter low-attenuation lesion within the LEFT hepatic lobe. Stable appearance of posterior segment RIGHT hepatic lobe measuring 1.4 centimeters. The large LEFT hepatic lobe lesion is not as well seen given the lack of intravenous contrast but contour abnormality of the LEFT hepatic lobe persists. Pancreas: Atrophic partially calcified pancreas. Spleen: Normal in size without focal abnormality. Adrenals/Urinary Tract: Adrenal glands are normal. There is new, significant RIGHT-sided hydronephrosis and tortuous, dilated RIGHT ureter, to the level of the pelvic brim where there is extensive compression by the pelvic adenopathy. Layering contrast identified within dilated collecting system on the RIGHT. LEFT kidney is unremarkable in appearance. The LEFT ureter is difficult to follow but not dilated. Urinary bladder is decompressed. No bladder stone. Visualized portion of the urethra is unremarkable. Stomach/Bowel: Stomach is normal in appearance. Small bowel loops have mildly thickened wall but do not appear dilated. Fluid-filled, mildly thickened loops of colon without evidence for obstruction. Central mesenteric partially calcified mass is estimated to measure 2.8 x 3.1 centimeters consistent with patient's known carcinoid tumor.  Vascular/Lymphatic: There is atherosclerotic calcification of the abdominal aorta, not associated with aneurysm. Nodal mass is identified in the RIGHT common iliac chain, measuring 2.6 x 3.0 centimeters. Enlarged LEFT internal iliac lymph node is 2.6 x 2.9 centimeters. The lack of intravenous and oral contrast makes it difficult to evaluate for pelvic lymph nodes. Reproductive: Diminutive uterus with prominent pelvic veins, less distinct compared to prior study. Other: There is a small amount of free pelvic fluid. Presacral edema noted. Bladder wall edema noted. Musculoskeletal: Stable sclerotic lesions within the sacrum, L4, L2 with stable wedge compression fracture of L3 and L5. IMPRESSION: 1. The study is limited by lack of intravenous and oral contrast. 2. Persistence of liver masses, better evaluated on prior contrast-enhanced exam. 3. Increased cardiomegaly since prior study. 4. Marked increased RIGHT hydronephrosis secondary to RIGHT iliac lymphadenopathy. 5. Persistent central abdominal carcinoid tumor. 6. Thickened small and large bowel loops. 7.  Aortic atherosclerosis.  (ICD10-I70.0) 8. Pelvic adenopathy. Adenopathy is not fully evaluated in this noncontrast technique. 9. Body wall and mesenteric edema. Small amount of free pelvic fluid. 10. Stable blastic lesions in the spine. Electronically Signed   By: Nolon Nations M.D.   On: 03/17/2019 12:08     CT scan results reviewed and discussed with urology Dr. Bernardo Heater.  Also discussed with the patient her findings including that her right kidney is becoming blocked.  She and her daughter understand plan to follow-up with urology this week ____________________________________________   PROCEDURES  Procedure(s) performed: None  Procedures  Critical Care performed: No  ____________________________________________   INITIAL IMPRESSION / ASSESSMENT AND PLAN / ED COURSE  Pertinent labs & imaging results that were available during my care of  the patient were reviewed by me and considered in my medical decision making (see chart for details).   Patient presents has had recent urinary tract infection, overall symptoms do seem to be improving but remaining dysuria.  Normal white count, afebrile.  No systemic symptoms, she does however report significantly increasing swelling both lower legs without shortness of breath.  She has water blisters on both legs.  Currently on anticoagulant does not appear to be at high risk for developing a DVT, clinical exam does not support that.  Appears peripheral edema without evidence of central or pulmonary edema  Clinical Course as of Mar 16 1328  Mon Mar 17, 2019  0927 9/2 UCx with e.coli. sensitive to cephalosportins (on keflex)   [MQ]    Clinical Course User Index [MQ] Delman Kitten, MD   ----------------------------------------- 1:27 PM on 03/17/2019 ----------------------------------------- Comfortable with plan to increase her Lasix slightly.  She does appear overall volume up with elevated BNP and edema.  Is comfortable with this plan, careful return precautions, will continue on additional 7 days of antibiotics and will have close follow-up with urology, Dr. Bernardo Heater will have his clinic call the patient for an appointment early this week for follow-up.  Patient also comfortable with follow-up with primary care, and will set up a visit with vascular surgery as well given her ongoing edema blistering secondary to volume overload of the lower extremities.  Return precautions and treatment recommendations and follow-up discussed with the patient who is agreeable with the plan.  Risks and benefits of Pyridium and antibiotic discussed with the patient and her daughter.   ____________________________________________   FINAL CLINICAL IMPRESSION(S) / ED DIAGNOSES  Final diagnoses:  Hydronephrosis of right kidney  Dysuria  Peripheral edema        Note:  This document was prepared using  Dragon voice recognition software and may include unintentional dictation errors       Delman Kitten, MD 03/17/19 1330

## 2019-03-17 NOTE — ED Triage Notes (Signed)
Reports urinary frequency, burning and hematuria since 03/12/19. Pt currently taking ABX with UTI. Pt also concerned with blisters and leg swelling to left lower extremity X 2 months. Wears oxygen at home at needed. Hx of COPD and CHF. Pt in NAD. RR even and unlabored.

## 2019-03-17 NOTE — Discharge Instructions (Signed)
Please follow-up closely with urology, Dr. Bernardo Heater this week.  I recommend you increase your Lasix by adding 20 mg each evening for the extra fluid that you have on for the next 3 days.  Follow-up closely with Dr. Caryl Comes.  Complete additional antibiotic as prescribed.  Return right away if you feel faint, weak, dehydrated, develop fevers, severe pain, trouble breathing or worsening or concerning symptoms.

## 2019-03-18 LAB — URINE CULTURE
Culture: <10000 — AB
Special Requests: NORMAL

## 2019-03-20 ENCOUNTER — Telehealth: Payer: Self-pay | Admitting: Urology

## 2019-03-20 NOTE — Telephone Encounter (Signed)
Pt was seen in ER Monday 03/17/2019 and it was recommended that she be seen this week. Pt is asking to see you. Where could we add her, she is new to [name]. Please advise.

## 2019-03-21 ENCOUNTER — Other Ambulatory Visit: Payer: Self-pay

## 2019-03-21 ENCOUNTER — Encounter: Payer: Self-pay | Admitting: Urology

## 2019-03-21 ENCOUNTER — Ambulatory Visit (INDEPENDENT_AMBULATORY_CARE_PROVIDER_SITE_OTHER): Payer: Medicare Other | Admitting: Urology

## 2019-03-21 VITALS — BP 113/79 | HR 92 | Ht <= 58 in | Wt 73.0 lb

## 2019-03-21 DIAGNOSIS — N390 Urinary tract infection, site not specified: Secondary | ICD-10-CM

## 2019-03-21 DIAGNOSIS — R399 Unspecified symptoms and signs involving the genitourinary system: Secondary | ICD-10-CM

## 2019-03-21 DIAGNOSIS — R310 Gross hematuria: Secondary | ICD-10-CM

## 2019-03-21 DIAGNOSIS — R319 Hematuria, unspecified: Secondary | ICD-10-CM

## 2019-03-21 DIAGNOSIS — N1330 Unspecified hydronephrosis: Secondary | ICD-10-CM

## 2019-03-21 LAB — MICROSCOPIC EXAMINATION: RBC, Urine: >30 /hpf — AB (ref 0–2)

## 2019-03-21 LAB — URINALYSIS, COMPLETE
Bilirubin, UA: NEGATIVE
Leukocytes,UA: NEGATIVE
Nitrite, UA: POSITIVE — AB
Specific Gravity, UA: 1.02 (ref 1.005–1.030)
Urobilinogen, Ur: 1 mg/dL (ref 0.2–1.0)
pH, UA: 5 (ref 5.0–7.5)

## 2019-03-21 MED ORDER — SULFAMETHOXAZOLE-TRIMETHOPRIM 800-160 MG PO TABS: 1.0000 | ORAL_TABLET | Freq: Two times a day (BID) | ORAL | 0 refills | Status: DC

## 2019-03-21 NOTE — Progress Notes (Signed)
03/21/2019 12:38 PM   Veronica Wang April 27, 1936 [id number]  Referring provider: Adin Hector, MD [facility] [place],  [place] [address]  Chief Complaint  Patient presents with  . Dysuria    HPI: Veronica Wang is an 83 y.o. female seen for evaluation of UTI and new right hydronephrosis.  She states approximately 3 weeks ago she developed intermittent gross hematuria and lower urinary tract symptoms consisting of frequency, urgency, dysuria and urge incontinence.  She was initially seen at an urgent care and started on cephalexin.  A urine culture was ordered by Dr. Caryl Comes which grew 80,000 colonies E. coli which was pansensitive.  She continued on the Keflex and had no improvement in her symptoms after 5 days of antibiotics.  She presented to the ED on 03/17/2019 with persistent symptoms.  She was also complaining of lower extremity edema and blistering.  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  A CT of the abdomen/pelvis without contrast was performed which showed new onset hydronephrosis and hydroureter secondary to extrinsic compression secondary to right iliac adenopathy.  She denies flank or abdominal pain.  No fever or chills.  Repeat urine culture in the ED had insignificant growth though her urinary symptoms have persisted.  PMH: Past Medical History:  Diagnosis Date  . Abnormal MRI 09/10/2015  . Atrial fibrillation ([place])   . Cancer ([place])    Liver  . Cancer ([place])    stomach  . Carcinoid tumor of intestine   . CHF (congestive heart failure) ([place])   . COPD (chronic obstructive pulmonary disease) ([place])   . FH: chemotherapy   . GERD (gastroesophageal reflux disease)   . Hyperlipidemia   . Hypertension   . Macular degeneration   . Polycythemia   . Radiation     Surgical History: Past Surgical History:  Procedure Laterality Date  . CATARACT EXTRACTION  splenectomy  . COLON RESECTION    . COLON SURGERY    . GASTRECTOMY     . SPLENECTOMY      Home Medications:  Allergies as of 03/21/2019      Reactions   No Known Allergies       Medication List       Accurate as of March 21, 2019 12:38 PM. If you have any questions, ask your nurse or doctor.        acetaminophen 325 MG tablet Commonly known as: TYLENOL Take 2 tablets (650 mg total) by mouth every 6 (six) hours as needed for mild pain (or Fever >/= 101).   albuterol 108 (90 Base) MCG/ACT inhaler Commonly known as: VENTOLIN HFA Inhale 2 puffs into the lungs every 6 (six) hours as needed for wheezing or shortness of breath.   atorvastatin 40 MG tablet Commonly known as: LIPITOR Take 1 tablet (40 mg total) by mouth daily at 6 PM.   Centrum Silver tablet Take 1 tablet by mouth daily.   cephALEXin 500 MG capsule Commonly known as: KEFLEX Take 1 capsule (500 mg total) by mouth 2 (two) times daily.   dabigatran 75 MG Caps capsule Commonly known as: PRADAXA Take 1 capsule (75 mg total) by mouth 2 (two) times daily.   famotidine 20 MG tablet Commonly known as: PEPCID Take 20 mg by mouth daily.   feeding supplement Liqd Take 1 Container by mouth 3 (three) times daily between meals.   furosemide 20 MG tablet Commonly known as: LASIX Take 40 mg by mouth  daily.   LANREOTIDE ACETATE [place] Inject into the skin.   lidocaine 2 % solution Commonly known as: XYLOCAINE Use as directed 15 mLs in the mouth or throat every 6 (six) hours as needed (chest/stomach pain).   lisinopril 5 MG tablet Commonly known as: ZESTRIL Take 5 mg by mouth daily.   loperamide 2 MG tablet Commonly known as: IMODIUM A-D Take 4 mg by mouth 4 (four) times daily as needed for diarrhea or loose stools.   metoprolol tartrate 25 MG tablet Commonly known as: LOPRESSOR Take 1 tablet (25 mg total) by mouth 2 (two) times daily.   octreotide 100 MCG/ML Soln injection Commonly known as: SANDOSTATIN Inject 100 mcg into the skin every 30 (thirty) days.   ondansetron 8  MG tablet Commonly known as: ZOFRAN Take 1 tablet (8 mg total) by mouth every 8 (eight) hours as needed for nausea or vomiting.   phenazopyridine 100 MG tablet Commonly known as: PYRIDIUM Take 1 tablet (100 mg total) by mouth 2 (two) times daily as needed for pain (dysuria).   potassium chloride 10 MEQ tablet Commonly known as: K-DUR Take 10 mEq by mouth daily.   prochlorperazine 10 MG tablet Commonly known as: COMPAZINE Take 1 tablet (10 mg total) by mouth every 6 (six) hours as needed for nausea or vomiting.   Telotristat Ethyl(as Etiprate) 250 MG Tabs Take 1 tablet by mouth 3 (three) times daily.   Vitamin B-12 CR 1000 MCG Tbcr Take 1 tablet by mouth daily.   Vitamin D-1000 Max St 25 MCG (1000 UT) tablet Generic drug: Cholecalciferol Take 1,000 Units by mouth daily.       Allergies:  Allergies  Allergen Reactions  . No Known Allergies     Family History: Family History  Problem Relation Age of Onset  . Heart attack Sister   . Heart attack Brother   . Leukemia Mother     Social History:  reports that she has quit smoking. Her smoking use included cigarettes. She has never used smokeless tobacco. She reports that she does not drink alcohol or use drugs.  ROS: UROLOGY Frequent Urination?: Yes Hard to postpone urination?: Yes Burning/pain with urination?: Yes Get up at night to urinate?: Yes Leakage of urine?: Yes Urine stream starts and stops?: Yes Trouble starting stream?: No Do you have to strain to urinate?: No Blood in urine?: Yes Urinary tract infection?: Yes Sexually transmitted disease?: No Injury to kidneys or bladder?: No Painful intercourse?: No Weak stream?: No Currently pregnant?: No Vaginal bleeding?: No Last menstrual period?: N/A  Gastrointestinal Nausea?: Yes Vomiting?: Yes Indigestion/heartburn?: Yes Diarrhea?: Yes Constipation?: No  Constitutional Fever: No Night sweats?: No Weight loss?: Yes Fatigue?: Yes  Skin Skin  rash/lesions?: No Itching?: No  Eyes Blurred vision?: No Double vision?: No  Ears/Nose/Throat Sore throat?: No Sinus problems?: Yes  Hematologic/Lymphatic Swollen glands?: No Easy bruising?: Yes  Cardiovascular Leg swelling?: Yes Chest pain?: Yes  Respiratory Cough?: No Shortness of breath?: Yes  Endocrine Excessive thirst?: No  Musculoskeletal Back pain?: Yes Joint pain?: No  Neurological Headaches?: Yes Dizziness?: No  Psychologic Depression?: Yes Anxiety?: No  Physical Exam: BP 113/79 (BP Location: Left Arm, Patient Position: Sitting, Cuff Size: Normal)   Pulse 92   Ht 4\' 9"  (1.448 m)   Wt 73 lb (33.1 kg)   BMI 15.80 kg/m   Constitutional:  Alert and oriented, No acute distress.  Thin, cachectic HEENT: [place] AT, moist mucus membranes.  Trachea midline, no masses. Cardiovascular: No clubbing, cyanosis,  or edema.  RRR Respiratory: Normal respiratory effort, no increased work of breathing.  Clear GI: Abdomen is soft, nontender, nondistended, no abdominal masses GU: No CVA tenderness Lymph: No cervical or inguinal lymphadenopathy. Skin: No rashes, bruises or suspicious lesions. Neurologic: Grossly intact, no focal deficits, moving all 4 extremities. Psychiatric: Normal mood and affect.  Laboratory Data: Lab Results  Component Value Date   WBC 10.5 03/17/2019   HGB 12.6 03/17/2019   HCT 39.7 03/17/2019   MCV 79.7 (L) 03/17/2019   PLT 312 03/17/2019    Lab Results  Component Value Date   CREATININE 0.83 03/17/2019    Urinalysis Dipstick 3+ blood nitrite positive negative leukocytes Microscopy >50 RBC  Pertinent Imaging: CT personally reviewed  Results for orders placed during the hospital encounter of 03/17/19  CT Renal Stone Study   Narrative CLINICAL DATA:  Hematuria unknown cause. Urinary frequency, burning, and hematuria since 03/12/2019. Patient is currently taking antibiotic with urinary tract infection. Blisters and swelling LEFT  LOWER extremity for 2 months. Wears oxygen at home as needed. History of COPD. Previous colonic section, splenectomy. History of liver and stomach cancer.  EXAM: CT ABDOMEN AND PELVIS WITHOUT CONTRAST  TECHNIQUE: Multidetector CT imaging of the abdomen and pelvis was performed following the standard protocol without IV contrast.  COMPARISON:  07/18/2018  FINDINGS: Lower chest: Motion degraded images of the LOWER lungs. There is consolidation and small LEFT pleural effusion at the MEDIAL LEFT lung base. The heart is markedly enlarged and appears more prominent compared with the prior study. Coronary artery calcifications are partially imaged.  Hepatobiliary: There is a 2.7 centimeter low-attenuation lesion within the dome of the RIGHT hepatic lobe. Indistinct 3.5 centimeter low-attenuation lesion within the LEFT hepatic lobe. Stable appearance of posterior segment RIGHT hepatic lobe measuring 1.4 centimeters. The large LEFT hepatic lobe lesion is not as well seen given the lack of intravenous contrast but contour abnormality of the LEFT hepatic lobe persists.  Pancreas: Atrophic partially calcified pancreas.  Spleen: Normal in size without focal abnormality.  Adrenals/Urinary Tract: Adrenal glands are normal. There is new, significant RIGHT-sided hydronephrosis and tortuous, dilated RIGHT ureter, to the level of the pelvic brim where there is extensive compression by the pelvic adenopathy. Layering contrast identified within dilated collecting system on the RIGHT.  LEFT kidney is unremarkable in appearance. The LEFT ureter is difficult to follow but not dilated. Urinary bladder is decompressed. No bladder stone. Visualized portion of the urethra is unremarkable.  Stomach/Bowel: Stomach is normal in appearance. Small bowel loops have mildly thickened wall but do not appear dilated. Fluid-filled, mildly thickened loops of colon without evidence for obstruction. Central  mesenteric partially calcified mass is estimated to measure 2.8 x 3.1 centimeters consistent with patient's known carcinoid tumor.  Vascular/Lymphatic: There is atherosclerotic calcification of the abdominal aorta, not associated with aneurysm.  Nodal mass is identified in the RIGHT common iliac chain, measuring 2.6 x 3.0 centimeters. Enlarged LEFT internal iliac lymph node is 2.6 x 2.9 centimeters. The lack of intravenous and oral contrast makes it difficult to evaluate for pelvic lymph nodes.  Reproductive: Diminutive uterus with prominent pelvic veins, less distinct compared to prior study.  Other: There is a small amount of free pelvic fluid. Presacral edema noted. Bladder wall edema noted.  Musculoskeletal: Stable sclerotic lesions within the sacrum, L4, L2 with stable wedge compression fracture of L3 and L5.  IMPRESSION: 1. The study is limited by lack of intravenous and oral contrast. 2. Persistence of liver masses,  better evaluated on prior contrast-enhanced exam. 3. Increased cardiomegaly since prior study. 4. Marked increased RIGHT hydronephrosis secondary to RIGHT iliac lymphadenopathy. 5. Persistent central abdominal carcinoid tumor. 6. Thickened small and large bowel loops. 7.  Aortic atherosclerosis.  (ICD10-I70.0) 8. Pelvic adenopathy. Adenopathy is not fully evaluated in this noncontrast technique. 9. Body wall and mesenteric edema. Small amount of free pelvic fluid. 10. Stable blastic lesions in the spine.   Electronically Signed   By: Nolon Nations M.D.   On: 03/17/2019 12:08     Assessment & Plan:    - Urinary tract infection Most recent urine culture was negative however bladder symptoms persist.  Urine culture was repeated today.  We will go ahead and change her antibiotics pending the culture result.  She was also given Myrbetriq samples 25 mg daily (lot: [id number] exp: 07/2020)  - Right hydronephrosis New onset without flank or abdominal  pain.  This appears to be secondary to right iliac adenopathy.  She is followed for GI carcinoid.  Her daughter will contact her oncologist at [facility].  Recommend scheduling cystoscopy with right retrograde pyelogram and right ureteral stent placement.  If not a candidate for anesthesia the procedure could potentially be performed under IV sedation.  The alternative of percutaneous nephrostomy drainage was discussed.  They would like to speak with her oncologist first.  Counseled on warning signs of sepsis with obstruction and in that event the need for urgent management.   Abbie Sons, [place] [address] [place], [place] [address] [phone]

## 2019-03-23 DIAGNOSIS — R310 Gross hematuria: Secondary | ICD-10-CM | POA: Insufficient documentation

## 2019-03-24 LAB — CULTURE, URINE COMPREHENSIVE

## 2019-03-25 ENCOUNTER — Other Ambulatory Visit: Payer: Self-pay

## 2019-03-25 ENCOUNTER — Ambulatory Visit (INDEPENDENT_AMBULATORY_CARE_PROVIDER_SITE_OTHER): Payer: Medicare Other | Admitting: Vascular Surgery

## 2019-03-25 ENCOUNTER — Encounter (INDEPENDENT_AMBULATORY_CARE_PROVIDER_SITE_OTHER): Payer: Self-pay | Admitting: Vascular Surgery

## 2019-03-25 VITALS — BP 108/60 | HR 64 | Resp 10 | Ht <= 58 in | Wt <= 1120 oz

## 2019-03-25 DIAGNOSIS — L97209 Non-pressure chronic ulcer of unspecified calf with unspecified severity: Secondary | ICD-10-CM | POA: Insufficient documentation

## 2019-03-25 DIAGNOSIS — I10 Essential (primary) hypertension: Secondary | ICD-10-CM

## 2019-03-25 DIAGNOSIS — E785 Hyperlipidemia, unspecified: Secondary | ICD-10-CM

## 2019-03-25 DIAGNOSIS — I5042 Chronic combined systolic (congestive) and diastolic (congestive) heart failure: Secondary | ICD-10-CM

## 2019-03-25 DIAGNOSIS — M7989 Other specified soft tissue disorders: Secondary | ICD-10-CM | POA: Insufficient documentation

## 2019-03-25 DIAGNOSIS — I639 Cerebral infarction, unspecified: Secondary | ICD-10-CM

## 2019-03-25 DIAGNOSIS — L97201 Non-pressure chronic ulcer of unspecified calf limited to breakdown of skin: Secondary | ICD-10-CM | POA: Diagnosis not present

## 2019-03-25 NOTE — Patient Instructions (Signed)

## 2019-03-25 NOTE — Assessment & Plan Note (Signed)
Patient has ulcerations on bilateral calves from breakdown from skin swelling.  Unna boots will be placed weekly.  A 3 layer Unna boot was placed today and change will be scheduled for 1 week.

## 2019-03-25 NOTE — Assessment & Plan Note (Signed)
lipid control important in reducing the progression of atherosclerotic disease.  Was previously on a statin agent but apparently is no longer taking.

## 2019-03-25 NOTE — Assessment & Plan Note (Signed)
Poor cardiac function worsens lower extremity swelling.  On appropriate medications and followed by her primary care physician.

## 2019-03-25 NOTE — Assessment & Plan Note (Signed)
The patient has been trying compression stockings and elevation but has persistent swelling and skin breakdown.  Until her wounds are healed, she will need Unna boot therapy and these were placed today.  A venous duplex will be planned in a few weeks at the patient's convenience to assess for thrombotic or reflux issues in the venous system.  She clearly has a component of lymphedema from chronic scarring and lymphatic channels and once her skin is healed we may consider a lymphedema pump as an adjuvant therapy.  We will do weekly Unna boot changes and see her back in 3 to 4 weeks with a venous reflux study.

## 2019-03-25 NOTE — Progress Notes (Signed)
Patient ID: Veronica Wang, female   DOB: 10/16/1935, 83 y.o.   MRN: [id number]  Chief Complaint  Patient presents with  . New Patient (Initial Visit)    HPI Veronica Wang is a 83 y.o. female.  I am asked to see the patient by Dr. Caryl Comes for evaluation of leg swelling and blistering.  The patient has a litany of medical issues including significant heart disease, oxygen dependent COPD, and apparently few weeks ago had a bad UTI with what sounds like some renal issues.  She also has ascites and had really severe leg swelling.  The leg swelling resulted in large blisters bilaterally.  She reports no trauma or injury to the legs.  She reports occasional swelling previously but nothing severe like this.  Both legs are affected.  The left leg was blistered and swollen more at first, but they are about the same at this point.  These are very painful.  She has been trying to elevate her legs without any significant improvement.  She reports no antecedent history of DVT or superficial thrombophlebitis to her knowledge.  She says the swelling was actually a fair bit worse a few weeks ago and she is on 3 different diuretic pills daily.  The swelling has improved somewhat, but the blistering and weeping has worsened   Past Medical History:  Diagnosis Date  . Abnormal MRI 09/10/2015  . Atrial fibrillation ([place])   . Cancer ([place])    Liver  . Cancer ([place])    stomach  . Carcinoid tumor of intestine   . CHF (congestive heart failure) ([place])   . COPD (chronic obstructive pulmonary disease) ([place])   . FH: chemotherapy   . GERD (gastroesophageal reflux disease)   . Hyperlipidemia   . Hypertension   . Macular degeneration   . Polycythemia   . Radiation     Past Surgical History:  Procedure Laterality Date  . CATARACT EXTRACTION  splenectomy  . COLON RESECTION    . COLON SURGERY    . GASTRECTOMY    . SPLENECTOMY      Family History Family History  Problem Relation Age of Onset  . Heart  attack Sister   . Heart attack Brother   . Leukemia Mother   No bleeding or clotting disorders  Social History Social History   Tobacco Use  . Smoking status: Former Smoker    Types: Cigarettes  . Smokeless tobacco: Never Used  Substance Use Topics  . Alcohol use: No    Alcohol/week: 0.0 standard drinks  . Drug use: No    Allergies  Allergen Reactions  . No Known Allergies     Current Outpatient Medications  Medication Sig Dispense Refill  . acetaminophen (TYLENOL) 325 MG tablet Take 2 tablets (650 mg total) by mouth every 6 (six) hours as needed for mild pain (or Fever >/= 101). 30 tablet 1  . albuterol (PROVENTIL HFA;VENTOLIN HFA) 108 (90 BASE) MCG/ACT inhaler Inhale 2 puffs into the lungs every 6 (six) hours as needed for wheezing or shortness of breath.    . cephALEXin (KEFLEX) 500 MG capsule Take 1 capsule (500 mg total) by mouth 2 (two) times daily. 14 capsule 0  . Cholecalciferol (VITAMIN D-1000 MAX ST) 1000 UNITS tablet Take 1,000 Units by mouth daily.     . Cyanocobalamin (VITAMIN B-12 CR) 1000 MCG TBCR Take 1 tablet by mouth daily.     . dabigatran (PRADAXA) 75 MG CAPS capsule Take 1 capsule (75 mg  total) by mouth 2 (two) times daily. 60 capsule 5  . furosemide (LASIX) 20 MG tablet Take 40 mg by mouth daily.     Marland Kitchen LANREOTIDE ACETATE [place] Inject into the skin.    Marland Kitchen lisinopril (PRINIVIL,ZESTRIL) 5 MG tablet Take 5 mg by mouth daily.    Marland Kitchen loperamide (IMODIUM A-D) 2 MG tablet Take 4 mg by mouth 4 (four) times daily as needed for diarrhea or loose stools.    . metoprolol tartrate (LOPRESSOR) 25 MG tablet Take 1 tablet (25 mg total) by mouth 2 (two) times daily. 60 tablet 5  . Multiple Vitamins-Minerals (CENTRUM SILVER) tablet Take 1 tablet by mouth daily.     . ondansetron (ZOFRAN) 8 MG tablet Take 1 tablet (8 mg total) by mouth every 8 (eight) hours as needed for nausea or vomiting. 30 tablet 0  . phenazopyridine (PYRIDIUM) 100 MG tablet Take 1 tablet (100 mg total) by mouth  2 (two) times daily as needed for pain (dysuria). 10 tablet 0  . potassium chloride (K-DUR) 10 MEQ tablet Take 10 mEq by mouth daily.    Marland Kitchen sulfamethoxazole-trimethoprim (BACTRIM DS) 800-160 MG tablet Take 1 tablet by mouth every 12 (twelve) hours. 14 tablet 0  . feeding supplement (BOOST HIGH PROTEIN) LIQD Take 1 Container by mouth 3 (three) times daily between meals.    . Telotristat Etiprate 250 MG TABS Take 1 tablet by mouth 3 (three) times daily.     No current facility-administered medications for this visit.    Facility-Administered Medications Ordered in Other Visits  Medication Dose Route Frequency Provider Last Rate Last Dose  . 0.9 %  sodium chloride infusion   Intravenous Continuous Jacquelin Hawking, NP   Stopped at 09/17/17 1630      REVIEW OF SYSTEMS (Negative unless checked)  Constitutional: [] Weight loss  [] Fever  [] Chills Cardiac: [] Chest pain   [] Chest pressure   [x] Palpitations   [x] Shortness of breath when laying flat   [x] Shortness of breath at rest   [x] Shortness of breath with exertion. Vascular:  [] Pain in legs with walking   [] Pain in legs at rest   [] Pain in legs when laying flat   [] Claudication   [] Pain in feet when walking  [] Pain in feet at rest  [] Pain in feet when laying flat   [] History of DVT   [] Phlebitis   [x] Swelling in legs   [] Varicose veins   [x] Non-healing ulcers Pulmonary:   [x] Uses home oxygen   [] Productive cough   [] Hemoptysis   [] Wheeze  [x] COPD   [] Asthma Neurologic:  [] Dizziness  [] Blackouts   [] Seizures   [] History of stroke   [] History of TIA  [] Aphasia   [] Temporary blindness   [] Dysphagia   [] Weakness or numbness in arms   [] Weakness or numbness in legs Musculoskeletal:  [x] Arthritis   [] Joint swelling   [x] Joint pain   [] Low back pain Hematologic:  [x] Easy bruising  [] Easy bleeding   [] Hypercoagulable state   [] Anemic  [] Hepatitis Gastrointestinal:  [] Blood in stool   [] Vomiting blood  [] Gastroesophageal reflux/heartburn   [x] Abdominal pain  Genitourinary:  [x] Chronic kidney disease   [] Difficult urination  [] Frequent urination  [] Burning with urination   [] Hematuria Skin:  [] Rashes   [x] Ulcers   [x] Wounds Psychological:  [] History of anxiety   []  History of major depression.    Physical Exam BP 108/60 (BP Location: Left Arm, Patient Position: Sitting, Cuff Size: Small)   Pulse 64   Resp 10   Ht 4\' 9"  (1.448 m)  Wt 70 lb (31.8 kg)   BMI 15.15 kg/m  Gen: Tiny, frail white female, NAD Head: [place]/AT, + temporalis wasting. Ear/Nose/Throat: Hearing grossly intact, nares w/o erythema or drainage, oropharynx w/o Erythema/Exudate Eyes: Conjunctiva clear, sclera non-icteric  Neck: trachea midline.  No JVD.  Pulmonary: Respirations are mildly labored at rest, on supplemental oxygen Cardiac: Irregularly irregular Vascular:  Vessel Right Left  Radial Palpable Palpable                          PT NP NP  DP 1+ Trace    Gastrointestinal:. No masses, surgical incisions, or scars.  Ascites is present Musculoskeletal: M/S 5/5 throughout.  Extremities without ischemic changes.  No deformity or atrophy.  Significant blistering is present on both legs with some mild weeping.  1-2+ bilateral lower extremity edema. Neurologic: Sensation grossly intact in extremities.  Symmetrical.  Speech is fluent. Motor exam as listed above. Psychiatric: Judgment intact, Mood & affect appropriate for pt's clinical situation. Dermatologic: Blistering on both lower legs with weeping beneath blisters.    Radiology US Venous Img Lower Unilateral Left  Result Date: 03/05/2019 CLINICAL DATA:  Swelling EXAM: LEFT LOWER EXTREMITY VENOUS DOPPLER ULTRASOUND TECHNIQUE: Gray-scale sonography with compression, as well as color and duplex ultrasound, were performed to evaluate the deep venous system from the level of the common femoral vein through the popliteal and proximal calf veins. COMPARISON:  None FINDINGS: Normal compressibility of the common femoral,  superficial femoral, and popliteal veins, as well as the proximal calf veins. No filling defects to suggest DVT on grayscale or color Doppler imaging. Doppler waveforms show normal direction of venous flow, normal respiratory phasicity and response to augmentation. Survey views of the contralateral common femoral vein are unremarkable. IMPRESSION: No femoropopliteal and no calf DVT in the visualized calf veins. If clinical symptoms are inconsistent or if there are persistent or worsening symptoms, further imaging (possibly involving the iliac veins) may be warranted. Electronically Signed   By: Lucrezia Europe M.D.   On: 03/05/2019 15:52   Dg Chest Portable 1 View  Result Date: 03/17/2019 CLINICAL DATA:  Urinary frequency, burning and hematuria since 03/12/2019. EXAM: PORTABLE CHEST 1 VIEW COMPARISON:  Single-view of the chest 10/28/2018 and 10/26/2018. PA and lateral chest 09/30/2014. FINDINGS: Chronic left basilar atelectasis is identified. The lungs are emphysematous. No pneumothorax or pleural effusion. Cardiomegaly. Atherosclerosis. IMPRESSION: No acute disease. Emphysema. Cardiomegaly. Atherosclerosis. Electronically Signed   By: Inge Rise M.D.   On: 03/17/2019 11:46   Ct Renal Stone Study  Result Date: 03/17/2019 CLINICAL DATA:  Hematuria unknown cause. Urinary frequency, burning, and hematuria since 03/12/2019. Patient is currently taking antibiotic with urinary tract infection. Blisters and swelling LEFT LOWER extremity for 2 months. Wears oxygen at home as needed. History of COPD. Previous colonic section, splenectomy. History of liver and stomach cancer. EXAM: CT ABDOMEN AND PELVIS WITHOUT CONTRAST TECHNIQUE: Multidetector CT imaging of the abdomen and pelvis was performed following the standard protocol without IV contrast. COMPARISON:  07/18/2018 FINDINGS: Lower chest: Motion degraded images of the LOWER lungs. There is consolidation and small LEFT pleural effusion at the MEDIAL LEFT lung base.  The heart is markedly enlarged and appears more prominent compared with the prior study. Coronary artery calcifications are partially imaged. Hepatobiliary: There is a 2.7 centimeter low-attenuation lesion within the dome of the RIGHT hepatic lobe. Indistinct 3.5 centimeter low-attenuation lesion within the LEFT hepatic lobe. Stable appearance of posterior segment RIGHT hepatic lobe  measuring 1.4 centimeters. The large LEFT hepatic lobe lesion is not as well seen given the lack of intravenous contrast but contour abnormality of the LEFT hepatic lobe persists. Pancreas: Atrophic partially calcified pancreas. Spleen: Normal in size without focal abnormality. Adrenals/Urinary Tract: Adrenal glands are normal. There is new, significant RIGHT-sided hydronephrosis and tortuous, dilated RIGHT ureter, to the level of the pelvic brim where there is extensive compression by the pelvic adenopathy. Layering contrast identified within dilated collecting system on the RIGHT. LEFT kidney is unremarkable in appearance. The LEFT ureter is difficult to follow but not dilated. Urinary bladder is decompressed. No bladder stone. Visualized portion of the urethra is unremarkable. Stomach/Bowel: Stomach is normal in appearance. Small bowel loops have mildly thickened wall but do not appear dilated. Fluid-filled, mildly thickened loops of colon without evidence for obstruction. Central mesenteric partially calcified mass is estimated to measure 2.8 x 3.1 centimeters consistent with patient's known carcinoid tumor. Vascular/Lymphatic: There is atherosclerotic calcification of the abdominal aorta, not associated with aneurysm. Nodal mass is identified in the RIGHT common iliac chain, measuring 2.6 x 3.0 centimeters. Enlarged LEFT internal iliac lymph node is 2.6 x 2.9 centimeters. The lack of intravenous and oral contrast makes it difficult to evaluate for pelvic lymph nodes. Reproductive: Diminutive uterus with prominent pelvic veins, less  distinct compared to prior study. Other: There is a small amount of free pelvic fluid. Presacral edema noted. Bladder wall edema noted. Musculoskeletal: Stable sclerotic lesions within the sacrum, L4, L2 with stable wedge compression fracture of L3 and L5. IMPRESSION: 1. The study is limited by lack of intravenous and oral contrast. 2. Persistence of liver masses, better evaluated on prior contrast-enhanced exam. 3. Increased cardiomegaly since prior study. 4. Marked increased RIGHT hydronephrosis secondary to RIGHT iliac lymphadenopathy. 5. Persistent central abdominal carcinoid tumor. 6. Thickened small and large bowel loops. 7.  Aortic atherosclerosis.  (ICD10-I70.0) 8. Pelvic adenopathy. Adenopathy is not fully evaluated in this noncontrast technique. 9. Body wall and mesenteric edema. Small amount of free pelvic fluid. 10. Stable blastic lesions in the spine. Electronically Signed   By: Nolon Nations M.D.   On: 03/17/2019 12:08    Labs Recent Results (from the past 2160 hour(s))  Urinalysis, Complete w Microscopic     Status: Abnormal   Collection Time: 03/12/19  1:52 PM  Result Value Ref Range   Color, Urine RED (A) YELLOW    Comment: BIOCHEMICALS MAY BE AFFECTED BY COLOR   APPearance CLOUDY (A) CLEAR   Specific Gravity, Urine 1.015 1.005 - 1.030   pH 5.5 5.0 - 8.0   Glucose, UA NEGATIVE NEGATIVE mg/dL   Hgb urine dipstick LARGE (A) NEGATIVE   Bilirubin Urine NEGATIVE NEGATIVE   Ketones, ur NEGATIVE NEGATIVE mg/dL   Protein, ur 30 (A) NEGATIVE mg/dL   Nitrite NEGATIVE NEGATIVE   Leukocytes,Ua NEGATIVE NEGATIVE   Squamous Epithelial / LPF NONE SEEN 0 - 5   WBC, UA NONE SEEN 0 - 5 WBC/hpf   RBC / HPF >50 0 - 5 RBC/hpf   Bacteria, UA NONE SEEN NONE SEEN   Ca Oxalate Crys, UA PRESENT     Comment: Performed at [facility], [address], [place], [place] [address]  Urine Culture     Status: Abnormal   Collection Time: 03/12/19  1:52 PM   Specimen: Urine, Random  Result  Value Ref Range   Specimen Description      URINE, RANDOM Performed at [facility], [phone]  [address] [place] [address] [place], [place] [address]    Special Requests      NONE Performed at [facility], [address], [place], [place] [address]    Culture 80,000 COLONIES/mL ESCHERICHIA COLI (A)    Report Status 03/14/2019 FINAL    Organism ID, Bacteria ESCHERICHIA COLI (A)       Susceptibility   Escherichia coli - MIC*    AMPICILLIN <=2 SENSITIVE Sensitive     CEFAZOLIN <=4 SENSITIVE Sensitive     CEFTRIAXONE <=1 SENSITIVE Sensitive     CIPROFLOXACIN <=0.25 SENSITIVE Sensitive     GENTAMICIN <=1 SENSITIVE Sensitive     IMIPENEM <=0.25 SENSITIVE Sensitive     NITROFURANTOIN <=16 SENSITIVE Sensitive     TRIMETH/SULFA <=20 SENSITIVE Sensitive     AMPICILLIN/SULBACTAM <=2 SENSITIVE Sensitive     PIP/TAZO <=4 SENSITIVE Sensitive     Extended ESBL NEGATIVE Sensitive     * 80,000 COLONIES/mL ESCHERICHIA COLI  CBC     Status: Abnormal   Collection Time: 03/17/19  9:13 AM  Result Value Ref Range   WBC 10.5 4.0 - 10.5 K/uL   RBC 4.98 3.87 - 5.11 MIL/uL   Hemoglobin 12.6 12.0 - 15.0 g/dL   HCT 39.7 36.0 - 46.0 %   MCV 79.7 (L) 80.0 - 100.0 fL   MCH 25.3 (L) 26.0 - 34.0 pg   MCHC 31.7 30.0 - 36.0 g/dL   RDW 15.9 (H) 11.5 - 15.5 %   Platelets 312 150 - 400 K/uL   nRBC 0.0 0.0 - 0.2 %    Comment: Performed at [facility], [address] [place], [place] [id number]  Basic metabolic panel     Status: Abnormal   Collection Time: 03/17/19  9:13 AM  Result Value Ref Range   Sodium 138 135 - 145 mmol/L   Potassium 4.6 3.5 - 5.1 mmol/L   Chloride 103 98 - 111 mmol/L   CO2 25 22 - 32 mmol/L   Glucose, Bld 99 70 - 99 mg/dL   BUN 20 8 - 23 mg/dL   Creatinine, Ser 0.83 0.44 - 1.00 mg/dL   Calcium 8.7 (L) 8.9 - 10.3 mg/dL   GFR calc non Af Amer >60 >60 mL/min   GFR calc Af Amer >60 >60 mL/min   Anion gap 10 5 - 15    Comment: Performed at [facility] [address] [place], [place] [address]  Brain natriuretic peptide     Status: Abnormal   Collection Time: 03/17/19  9:13 AM  Result Value Ref Range   B Natriuretic Peptide 2,597.0 (H) 0.0 - 100.0 pg/mL    Comment: Performed at [facility], [address] [place], [place] [address]  Urinalysis, Complete w Microscopic     Status: Abnormal   Collection Time: 03/17/19  9:59 AM  Result Value Ref Range   Color, Urine AMBER (A) YELLOW    Comment: BIOCHEMICALS MAY BE AFFECTED BY COLOR   APPearance CLOUDY (A) CLEAR   Specific Gravity, Urine 1.025 1.005 - 1.030   pH 6.0 5.0 - 8.0   Glucose, UA NEGATIVE NEGATIVE mg/dL   Hgb urine dipstick LARGE (A) NEGATIVE   Bilirubin Urine NEGATIVE NEGATIVE   Ketones, ur NEGATIVE NEGATIVE mg/dL   Protein, ur 100 (A) NEGATIVE mg/dL   Nitrite NEGATIVE NEGATIVE   Leukocytes,Ua NEGATIVE NEGATIVE   RBC / HPF >50 (H) 0 - 5 RBC/hpf   WBC, UA >50 (H) 0 - 5 WBC/hpf   Bacteria,  UA NONE SEEN NONE SEEN   Squamous Epithelial / LPF 0-5 0 - 5   Mucus PRESENT     Comment: Performed at [facility], [address], [place], [place] [address]  Urine Culture     Status: Abnormal   Collection Time: 03/17/19  9:59 AM   Specimen: Urine, Clean Catch  Result Value Ref Range   Specimen Description      URINE, CLEAN CATCH Performed at [facility], [address], [place], [place] [address]    Special Requests      Normal Performed at [facility], [address], [place], [place] [address]    Culture (A)     <10,000 COLONIES/mL INSIGNIFICANT GROWTH Performed at [facility] [address] [place], [place] [address]    Report Status 03/18/2019 FINAL   Urinalysis, Complete     Status: Abnormal   Collection Time: 03/21/19 11:08 AM  Result Value Ref Range   Specific Gravity, UA 1.020 1.005 - 1.030   pH, UA 5.0 5.0 - 7.5   Color, UA Orange Yellow   Appearance Ur Cloudy (A) Clear   Leukocytes,UA Negative Negative    Protein,UA 3+ (A) Negative/Trace   Glucose, UA 1+ (A) Negative   Ketones, UA Trace (A) Negative   RBC, UA 3+ (A) Negative   Bilirubin, UA Negative Negative   Urobilinogen, Ur 1.0 0.2 - 1.0 mg/dL   Nitrite, UA Positive (A) Negative   Microscopic Examination See below:   Microscopic Examination     Status: Abnormal   Collection Time: 03/21/19 11:08 AM   URINE  Result Value Ref Range   WBC, UA 0-5 0 - 5 /hpf   RBC >30 (A) 0 - 2 /hpf   Epithelial Cells (non renal) 0-10 0 - 10 /hpf   Casts Present (A) None seen /lpf   Cast Type Hyaline casts N/A   Bacteria, UA Few None seen/Few  CULTURE, URINE COMPREHENSIVE     Status: None   Collection Time: 03/21/19 11:34 AM   Specimen: Urine   UR  Result Value Ref Range   Urine Culture, Comprehensive Final report    Organism ID, Bacteria Comment     Comment: Mixed urogenital flora Less than 10,000 colonies/mL     Assessment/Plan:  HTN (hypertension) blood pressure control important in reducing the progression of atherosclerotic disease. On appropriate oral medications.   Chronic combined systolic (congestive) and diastolic (congestive) heart failure (HCC) Poor cardiac function worsens lower extremity swelling.  On appropriate medications and followed by her primary care physician.  HLD (hyperlipidemia) lipid control important in reducing the progression of atherosclerotic disease.  Was previously on a statin agent but apparently is no longer taking.   Lower limb ulcer, calf ([place]) Patient has ulcerations on bilateral calves from breakdown from skin swelling.  Unna boots will be placed weekly.  A 3 layer Unna boot was placed today and change will be scheduled for 1 week.  Swelling of limb The patient has been trying compression stockings and elevation but has persistent swelling and skin breakdown.  Until her wounds are healed, she will need Unna boot therapy and these were placed today.  A venous duplex will be planned in a few weeks at  the patient's convenience to assess for thrombotic or reflux issues in the venous system.  She clearly has a component of lymphedema from chronic scarring and lymphatic channels and once her skin is healed we may consider a lymphedema pump as  an adjuvant therapy.  We will do weekly Unna boot changes and see her back in 3 to 4 weeks with a venous reflux study.      Leotis Pain 03/25/2019, 9:46 AM   This note was created with Dragon medical transcription system.  Any errors from dictation are unintentional.

## 2019-03-25 NOTE — Assessment & Plan Note (Signed)
blood pressure control important in reducing the progression of atherosclerotic disease. On appropriate oral medications.  

## 2019-03-26 ENCOUNTER — Telehealth: Payer: Self-pay

## 2019-03-26 NOTE — Telephone Encounter (Signed)
-----   Message from Abbie Sons, MD sent at 03/25/2019  5:05 PM EDT ----- Urine culture was negative for infection

## 2019-03-26 NOTE — Telephone Encounter (Signed)
Per DPR patient notified on vmail

## 2019-03-27 ENCOUNTER — Telehealth: Payer: Self-pay | Admitting: Urology

## 2019-03-27 NOTE — Telephone Encounter (Signed)
Pt's daughter called to let us know since her visit on 9/11, pt isn't any better.  She has been taking abx and samples that [facility] gave her.  Please give daughter a call at work.

## 2019-03-27 NOTE — Telephone Encounter (Signed)
It can take the Myrbetriq a few weeks to become effective so I would continue the medication.  She can be seen acutely by Sam if her symptoms worsen.  We will give Veronica Wang a scheduling sheet for cystoscopy and stent placement.

## 2019-03-28 NOTE — Telephone Encounter (Signed)
Called patient cannot leave vmail mail box is full

## 2019-03-28 NOTE — Telephone Encounter (Signed)
No answer, mail box full cannot leave message

## 2019-04-01 ENCOUNTER — Encounter (INDEPENDENT_AMBULATORY_CARE_PROVIDER_SITE_OTHER): Payer: Self-pay | Admitting: Nurse Practitioner

## 2019-04-01 ENCOUNTER — Ambulatory Visit (INDEPENDENT_AMBULATORY_CARE_PROVIDER_SITE_OTHER): Payer: Medicare Other | Admitting: Nurse Practitioner

## 2019-04-01 ENCOUNTER — Other Ambulatory Visit: Payer: Self-pay

## 2019-04-01 VITALS — BP 102/68 | HR 81 | Resp 10 | Ht <= 58 in | Wt <= 1120 oz

## 2019-04-01 DIAGNOSIS — L97201 Non-pressure chronic ulcer of unspecified calf limited to breakdown of skin: Secondary | ICD-10-CM

## 2019-04-01 NOTE — Progress Notes (Signed)
History of Present Illness  There is no documented history at this time  Assessments & Plan   There are no diagnoses linked to this encounter.    Additional instructions  Subjective:  Patient presents with venous ulcer of the Bilateral lower extremity.    Procedure:  3 layer unna wrap was placed Bilateral lower extremity.   Plan:   Follow up in one week.  

## 2019-04-02 NOTE — Telephone Encounter (Signed)
Third attempt to contact patient all numbers listed tried and no answer, no voicemail and one listed mailbox is full  Final attempt was able to contact patient and notify her of message. She states symptoms are gradually getting better she will call back if they worsen

## 2019-04-03 ENCOUNTER — Ambulatory Visit
Admit: 2019-04-03 | Discharge: 2019-04-04 | Payer: MEDICARE | Attending: Hematology & Oncology | Primary: Hematology & Oncology

## 2019-04-03 DIAGNOSIS — I69311 Memory deficit following cerebral infarction: Secondary | ICD-10-CM

## 2019-04-03 DIAGNOSIS — Z9981 Dependence on supplemental oxygen: Secondary | ICD-10-CM

## 2019-04-03 DIAGNOSIS — R197 Diarrhea, unspecified: Secondary | ICD-10-CM

## 2019-04-03 DIAGNOSIS — C7B8 Other secondary neuroendocrine tumors: Secondary | ICD-10-CM

## 2019-04-03 DIAGNOSIS — I6340 Cerebral infarction due to embolism of unspecified cerebral artery: Secondary | ICD-10-CM

## 2019-04-03 DIAGNOSIS — I69320 Aphasia following cerebral infarction: Secondary | ICD-10-CM

## 2019-04-03 DIAGNOSIS — I509 Heart failure, unspecified: Secondary | ICD-10-CM

## 2019-04-03 DIAGNOSIS — F329 Major depressive disorder, single episode, unspecified: Secondary | ICD-10-CM

## 2019-04-03 DIAGNOSIS — R630 Anorexia: Secondary | ICD-10-CM

## 2019-04-03 DIAGNOSIS — Z87891 Personal history of nicotine dependence: Secondary | ICD-10-CM

## 2019-04-03 DIAGNOSIS — I5043 Acute on chronic combined systolic (congestive) and diastolic (congestive) heart failure: Secondary | ICD-10-CM

## 2019-04-03 DIAGNOSIS — Z681 Body mass index (BMI) 19 or less, adult: Secondary | ICD-10-CM

## 2019-04-03 DIAGNOSIS — I4891 Unspecified atrial fibrillation: Secondary | ICD-10-CM

## 2019-04-03 DIAGNOSIS — J438 Other emphysema: Secondary | ICD-10-CM

## 2019-04-03 DIAGNOSIS — C7A8 Other malignant neuroendocrine tumors: Secondary | ICD-10-CM

## 2019-04-03 DIAGNOSIS — R454 Irritability and anger: Secondary | ICD-10-CM

## 2019-04-03 DIAGNOSIS — J449 Chronic obstructive pulmonary disease, unspecified: Secondary | ICD-10-CM

## 2019-04-03 DIAGNOSIS — E340 Carcinoid syndrome: Secondary | ICD-10-CM

## 2019-04-03 DIAGNOSIS — E43 Unspecified severe protein-calorie malnutrition: Secondary | ICD-10-CM

## 2019-04-03 MED ORDER — MIRTAZAPINE 15 MG TABLET
ORAL_TABLET | Freq: Every evening | ORAL | 6 refills | 30.00000 days | Status: CP
Start: 2019-04-03 — End: ?

## 2019-04-09 ENCOUNTER — Encounter (INDEPENDENT_AMBULATORY_CARE_PROVIDER_SITE_OTHER): Payer: Self-pay | Admitting: Nurse Practitioner

## 2019-04-09 ENCOUNTER — Ambulatory Visit (INDEPENDENT_AMBULATORY_CARE_PROVIDER_SITE_OTHER): Payer: Medicare Other

## 2019-04-09 ENCOUNTER — Other Ambulatory Visit: Payer: Self-pay

## 2019-04-09 ENCOUNTER — Telehealth (INDEPENDENT_AMBULATORY_CARE_PROVIDER_SITE_OTHER): Payer: Self-pay | Admitting: Nurse Practitioner

## 2019-04-09 ENCOUNTER — Ambulatory Visit (INDEPENDENT_AMBULATORY_CARE_PROVIDER_SITE_OTHER): Payer: Medicare Other | Admitting: Nurse Practitioner

## 2019-04-09 VITALS — BP 96/58 | HR 70 | Resp 10 | Ht <= 58 in | Wt <= 1120 oz

## 2019-04-09 DIAGNOSIS — L97201 Non-pressure chronic ulcer of unspecified calf limited to breakdown of skin: Secondary | ICD-10-CM

## 2019-04-09 DIAGNOSIS — R600 Localized edema: Secondary | ICD-10-CM

## 2019-04-09 DIAGNOSIS — M7989 Other specified soft tissue disorders: Secondary | ICD-10-CM

## 2019-04-09 NOTE — Telephone Encounter (Signed)
Patient daughter calling requesting home health for medication management. I advised the daughter that we would only be able to refer home health for vascular related issues and since we do not manage her medication that referral would need to come from patients PCP. Judeen Hammans verbalized understanding. AS, CMA

## 2019-04-09 NOTE — Progress Notes (Signed)
History of Present Illness  There is no documented history at this time  Assessments & Plan   There are no diagnoses linked to this encounter.    Additional instructions  Subjective:  Patient presents with venous ulcer of the Bilateral lower extremity.    Procedure:  3 layer unna wrap was placed Bilateral lower extremity.   Plan:   Follow up in one week.  

## 2019-04-15 ENCOUNTER — Encounter (INDEPENDENT_AMBULATORY_CARE_PROVIDER_SITE_OTHER): Payer: Self-pay | Admitting: Nurse Practitioner

## 2019-04-15 ENCOUNTER — Other Ambulatory Visit: Payer: Self-pay

## 2019-04-15 ENCOUNTER — Ambulatory Visit (INDEPENDENT_AMBULATORY_CARE_PROVIDER_SITE_OTHER): Payer: Medicare Other | Admitting: Nurse Practitioner

## 2019-04-15 VITALS — BP 109/73 | HR 73 | Resp 14 | Wt 70.6 lb

## 2019-04-15 DIAGNOSIS — I5042 Chronic combined systolic (congestive) and diastolic (congestive) heart failure: Secondary | ICD-10-CM | POA: Diagnosis not present

## 2019-04-15 DIAGNOSIS — I10 Essential (primary) hypertension: Secondary | ICD-10-CM | POA: Diagnosis not present

## 2019-04-15 DIAGNOSIS — L97201 Non-pressure chronic ulcer of unspecified calf limited to breakdown of skin: Secondary | ICD-10-CM | POA: Diagnosis not present

## 2019-04-15 NOTE — Progress Notes (Signed)
SUBJECTIVE:  Patient ID: Veronica Wang, female    DOB: 01/01/1936, 83 y.o.   MRN: [id number] Chief Complaint  Patient presents with  . Follow-up    unna check    HPI  Veronica Wang is a 83 y.o. female that presents today for evaluation of bilateral Unna wraps.  Today the patient's blisters that were present are resolved.  The patient's edema is also resolved.  The patient states that her legs feel much better with the swelling decreased.  She denies any fever, chills, nausea, vomiting or diarrhea.  Past Medical History:  Diagnosis Date  . Abnormal MRI 09/10/2015  . Atrial fibrillation ([place])   . Cancer ([place])    Liver  . Cancer ([place])    stomach  . Carcinoid tumor of intestine   . CHF (congestive heart failure) ([place])   . COPD (chronic obstructive pulmonary disease) ([place])   . FH: chemotherapy   . GERD (gastroesophageal reflux disease)   . Hyperlipidemia   . Hypertension   . Macular degeneration   . Polycythemia   . Radiation     Past Surgical History:  Procedure Laterality Date  . CATARACT EXTRACTION  splenectomy  . COLON RESECTION    . COLON SURGERY    . GASTRECTOMY    . SPLENECTOMY      Social History   Socioeconomic History  . Marital status: Divorced    Spouse name: Not on file  . Number of children: Not on file  . Years of education: Not on file  . Highest education level: Not on file  Occupational History  . Not on file  Social Needs  . Financial resource strain: Not on file  . Food insecurity    Worry: Not on file    Inability: Not on file  . Transportation needs    Medical: Not on file    Non-medical: Not on file  Tobacco Use  . Smoking status: Former Smoker    Types: Cigarettes  . Smokeless tobacco: Never Used  Substance and Sexual Activity  . Alcohol use: No    Alcohol/week: 0.0 standard drinks  . Drug use: No  . Sexual activity: Not Currently    Birth control/protection: Post-menopausal  Lifestyle  . Physical activity    Days per  week: Not on file    Minutes per session: Not on file  . Stress: Not on file  Relationships  . Social Herbalist on phone: Not on file    Gets together: Not on file    Attends religious service: Not on file    Active member of club or organization: Not on file    Attends meetings of clubs or organizations: Not on file    Relationship status: Not on file  . Intimate partner violence    Fear of current or ex partner: Not on file    Emotionally abused: Not on file    Physically abused: Not on file    Forced sexual activity: Not on file  Other Topics Concern  . Not on file  Social History Narrative  . Not on file    Family History  Problem Relation Age of Onset  . Heart attack Sister   . Heart attack Brother   . Leukemia Mother     Allergies  Allergen Reactions  . No Known Allergies      Review of Systems   Review of Systems: Negative Unless Checked Constitutional: [] Weight loss  [] Fever  [] Chills Cardiac: []   Chest pain   []  Atrial Fibrillation  [] Palpitations   [] Shortness of breath when laying flat   [] Shortness of breath with exertion. [] Shortness of breath at rest Vascular:  [] Pain in legs with walking   [] Pain in legs with standing [] Pain in legs when laying flat   [] Claudication    [] Pain in feet when laying flat    [] History of DVT   [] Phlebitis   [x] Swelling in legs   [x] Varicose veins   [] Non-healing ulcers Pulmonary:   [x] Uses home oxygen   [] Productive cough   [] Hemoptysis   [] Wheeze  [] COPD   [] Asthma Neurologic:  [] Dizziness   [] Seizures  [] Blackouts [] History of stroke   [] History of TIA  [] Aphasia   [] Temporary Blindness   [] Weakness or numbness in arm   [] Weakness or numbness in leg Musculoskeletal:   [] Joint swelling   [] Joint pain   [] Low back pain  []  History of Knee Replacement [] Arthritis [] back Surgeries  []  Spinal Stenosis    Hematologic:  [] Easy bruising  [] Easy bleeding   [] Hypercoagulable state   [] Anemic Gastrointestinal:  [] Diarrhea    [] Vomiting  [] Gastroesophageal reflux/heartburn   [] Difficulty swallowing. [] Abdominal pain Genitourinary:  [] Chronic kidney disease   [] Difficult urination  [] Anuric   [] Blood in urine [] Frequent urination  [] Burning with urination   [] Hematuria Skin:  [] Rashes   [] Ulcers [] Wounds Psychological:  [] History of anxiety   []  History of major depression  []  Memory Difficulties      OBJECTIVE:   Physical Exam  BP 109/73 (BP Location: Right Arm)   Pulse 73   Resp 14   Wt 70 lb 9.6 oz (32 kg)   BMI 15.28 kg/m   Gen: WD/WN, NAD Head: [place]/AT, No temporalis wasting.  Ear/Nose/Throat: Hearing grossly intact, nares w/o erythema or drainage Eyes: PER, EOMI, sclera nonicteric.  Neck: Supple, no masses.  No JVD.  Pulmonary:  Good air movement, no use of accessory muscles. Home O2 Cardiac: RRR Vascular: Minimal edema present, scattered spider veins Vessel Right Left  Radial Palpable Palpable  Dorsalis Pedis Not Palpable Not Palpable  Posterior Tibial Not Palpable Not Palpable   Gastrointestinal: soft, non-distended. No guarding/no peritoneal signs.  Musculoskeletal: M/S 5/5 throughout.  No deformity or atrophy.  Neurologic: Pain and light touch intact in extremities.  Symmetrical.  Speech is fluent. Motor exam as listed above. Psychiatric: Judgment intact, Mood & affect appropriate for pt's clinical situation. Dermatologic: No Venous rashes. No Ulcers Noted.  No changes consistent with cellulitis. Lymph : No Cervical lymphadenopathy, no lichenification or skin changes of chronic lymphedema.       ASSESSMENT AND PLAN:  1. Skin ulcer of calf, limited to breakdown of skin, unspecified laterality ([place]) Ulcer is resolved.  No edema present.  The patient will transition to medical grade 1 compression stockings with elevation and exercise.  The patient will follow up in 6 months.    2. Essential hypertension Continue antihypertensive medications as already ordered, these medications have been  reviewed and there are no changes at this time.   3. Chronic combined systolic (congestive) and diastolic (congestive) heart failure ([place]) The patient has numerous issues that will contribute to her lower extremity edema.  Advised to the patient that   Current Outpatient Medications on File Prior to Visit  Medication Sig Dispense Refill  . acetaminophen (TYLENOL) 325 MG tablet Take 2 tablets (650 mg total) by mouth every 6 (six) hours as needed for mild pain (or Fever >/= 101). 30 tablet 1  . albuterol (  PROVENTIL HFA;VENTOLIN HFA) 108 (90 BASE) MCG/ACT inhaler Inhale 2 puffs into the lungs every 6 (six) hours as needed for wheezing or shortness of breath.    . Cholecalciferol (VITAMIN D-1000 MAX ST) 1000 UNITS tablet Take 1,000 Units by mouth daily.     . Cyanocobalamin (VITAMIN B-12 CR) 1000 MCG TBCR Take 1 tablet by mouth daily.     . dabigatran (PRADAXA) 75 MG CAPS capsule Take 1 capsule (75 mg total) by mouth 2 (two) times daily. 60 capsule 5  . ELIQUIS 2.5 MG TABS tablet 2.5 mg 2 (two) times daily.     . feeding supplement (BOOST HIGH PROTEIN) LIQD Take 1 Container by mouth 3 (three) times daily between meals.    . furosemide (LASIX) 20 MG tablet Take 40 mg by mouth daily.     Marland Kitchen LANREOTIDE ACETATE [place] Inject into the skin.    Marland Kitchen lisinopril (PRINIVIL,ZESTRIL) 5 MG tablet Take 5 mg by mouth daily.    Marland Kitchen loperamide (IMODIUM A-D) 2 MG tablet Take 4 mg by mouth 4 (four) times daily as needed for diarrhea or loose stools.    . metoprolol tartrate (LOPRESSOR) 25 MG tablet Take 1 tablet (25 mg total) by mouth 2 (two) times daily. 60 tablet 5  . Multiple Vitamins-Minerals (CENTRUM SILVER) tablet Take 1 tablet by mouth daily.     . ondansetron (ZOFRAN) 8 MG tablet Take 1 tablet (8 mg total) by mouth every 8 (eight) hours as needed for nausea or vomiting. 30 tablet 0  . phenazopyridine (PYRIDIUM) 100 MG tablet Take 1 tablet (100 mg total) by mouth 2 (two) times daily as needed for pain (dysuria). 10  tablet 0  . potassium chloride (K-DUR) 10 MEQ tablet Take 10 mEq by mouth daily.    . Telotristat Etiprate 250 MG TABS Take 1 tablet by mouth 3 (three) times daily.    . cephALEXin (KEFLEX) 500 MG capsule Take 1 capsule (500 mg total) by mouth 2 (two) times daily. (Patient not taking: Reported on 04/15/2019) 14 capsule 0  . sulfamethoxazole-trimethoprim (BACTRIM DS) 800-160 MG tablet Take 1 tablet by mouth every 12 (twelve) hours. (Patient not taking: Reported on 04/15/2019) 14 tablet 0   Current Facility-Administered Medications on File Prior to Visit  Medication Dose Route Frequency Provider Last Rate Last Dose  . 0.9 %  sodium chloride infusion   Intravenous Continuous Jacquelin Hawking, NP   Stopped at 09/17/17 1630    There are no Patient Instructions on file for this visit. No follow-ups on file.   Kris Hartmann, NP  This note was completed with Sales executive.  Any errors are purely unintentional.

## 2019-04-17 ENCOUNTER — Institutional Professional Consult (permissible substitution): Admit: 2019-04-17 | Discharge: 2019-04-18 | Payer: MEDICARE

## 2019-04-17 DIAGNOSIS — E340 Carcinoid syndrome: Secondary | ICD-10-CM

## 2019-04-17 DIAGNOSIS — C7A8 Other malignant neuroendocrine tumors: Secondary | ICD-10-CM

## 2019-04-17 DIAGNOSIS — C7B8 Other secondary neuroendocrine tumors: Secondary | ICD-10-CM

## 2019-04-18 ENCOUNTER — Encounter (INDEPENDENT_AMBULATORY_CARE_PROVIDER_SITE_OTHER): Payer: Self-pay | Admitting: Nurse Practitioner

## 2019-04-21 ENCOUNTER — Ambulatory Visit: Payer: Medicare Other | Admitting: Family

## 2019-04-24 ENCOUNTER — Ambulatory Visit: Admit: 2019-04-24 | Discharge: 2019-04-25 | Payer: MEDICARE

## 2019-04-24 DIAGNOSIS — C7B8 Other secondary neuroendocrine tumors: Secondary | ICD-10-CM

## 2019-04-24 DIAGNOSIS — C7A8 Other malignant neuroendocrine tumors: Principal | ICD-10-CM

## 2019-04-24 DIAGNOSIS — Z515 Encounter for palliative care: Principal | ICD-10-CM

## 2019-04-24 DIAGNOSIS — I5020 Unspecified systolic (congestive) heart failure: Principal | ICD-10-CM

## 2019-04-28 DIAGNOSIS — E340 Carcinoid syndrome: Principal | ICD-10-CM

## 2019-04-30 ENCOUNTER — Inpatient Hospital Stay
Admission: EM | Admit: 2019-04-30 | Discharge: 2019-05-03 | DRG: 177 | Disposition: A | Discharge status: Home Health | Payer: Medicare Other | Attending: Internal Medicine | Admitting: Internal Medicine

## 2019-04-30 ENCOUNTER — Emergency Department: Payer: Medicare Other

## 2019-04-30 ENCOUNTER — Other Ambulatory Visit: Payer: Self-pay

## 2019-04-30 ENCOUNTER — Encounter: Payer: Self-pay | Admitting: Emergency Medicine

## 2019-04-30 DIAGNOSIS — R0600 Dyspnea, unspecified: Secondary | ICD-10-CM | POA: Diagnosis not present

## 2019-04-30 DIAGNOSIS — Z806 Family history of leukemia: Secondary | ICD-10-CM

## 2019-04-30 DIAGNOSIS — K219 Gastro-esophageal reflux disease without esophagitis: Secondary | ICD-10-CM | POA: Diagnosis present

## 2019-04-30 DIAGNOSIS — Z9849 Cataract extraction status, unspecified eye: Secondary | ICD-10-CM

## 2019-04-30 DIAGNOSIS — D72829 Elevated white blood cell count, unspecified: Secondary | ICD-10-CM | POA: Diagnosis not present

## 2019-04-30 DIAGNOSIS — Z20828 Contact with and (suspected) exposure to other viral communicable diseases: Secondary | ICD-10-CM | POA: Diagnosis present

## 2019-04-30 DIAGNOSIS — H3530 Unspecified macular degeneration: Secondary | ICD-10-CM | POA: Diagnosis present

## 2019-04-30 DIAGNOSIS — Z87891 Personal history of nicotine dependence: Secondary | ICD-10-CM | POA: Diagnosis not present

## 2019-04-30 DIAGNOSIS — I4891 Unspecified atrial fibrillation: Secondary | ICD-10-CM | POA: Diagnosis present

## 2019-04-30 DIAGNOSIS — Z9981 Dependence on supplemental oxygen: Secondary | ICD-10-CM

## 2019-04-30 DIAGNOSIS — E43 Unspecified severe protein-calorie malnutrition: Secondary | ICD-10-CM | POA: Diagnosis present

## 2019-04-30 DIAGNOSIS — I509 Heart failure, unspecified: Secondary | ICD-10-CM | POA: Diagnosis present

## 2019-04-30 DIAGNOSIS — C7B8 Other secondary neuroendocrine tumors: Secondary | ICD-10-CM | POA: Diagnosis not present

## 2019-04-30 DIAGNOSIS — Z681 Body mass index (BMI) 19 or less, adult: Secondary | ICD-10-CM

## 2019-04-30 DIAGNOSIS — Z23 Encounter for immunization: Secondary | ICD-10-CM

## 2019-04-30 DIAGNOSIS — R042 Hemoptysis: Secondary | ICD-10-CM | POA: Diagnosis not present

## 2019-04-30 DIAGNOSIS — F17211 Nicotine dependence, cigarettes, in remission: Secondary | ICD-10-CM | POA: Diagnosis not present

## 2019-04-30 DIAGNOSIS — R1310 Dysphagia, unspecified: Secondary | ICD-10-CM

## 2019-04-30 DIAGNOSIS — E785 Hyperlipidemia, unspecified: Secondary | ICD-10-CM | POA: Diagnosis present

## 2019-04-30 DIAGNOSIS — J690 Pneumonitis due to inhalation of food and vomit: Principal | ICD-10-CM | POA: Diagnosis present

## 2019-04-30 DIAGNOSIS — C7A8 Other malignant neuroendocrine tumors: Secondary | ICD-10-CM | POA: Diagnosis present

## 2019-04-30 DIAGNOSIS — Z8673 Personal history of transient ischemic attack (TIA), and cerebral infarction without residual deficits: Secondary | ICD-10-CM

## 2019-04-30 DIAGNOSIS — N1330 Unspecified hydronephrosis: Secondary | ICD-10-CM | POA: Diagnosis present

## 2019-04-30 DIAGNOSIS — J439 Emphysema, unspecified: Secondary | ICD-10-CM | POA: Diagnosis present

## 2019-04-30 DIAGNOSIS — R197 Diarrhea, unspecified: Secondary | ICD-10-CM | POA: Diagnosis present

## 2019-04-30 DIAGNOSIS — I110 Hypertensive heart disease with heart failure: Secondary | ICD-10-CM | POA: Diagnosis present

## 2019-04-30 DIAGNOSIS — Z79899 Other long term (current) drug therapy: Secondary | ICD-10-CM

## 2019-04-30 DIAGNOSIS — C787 Secondary malignant neoplasm of liver and intrahepatic bile duct: Secondary | ICD-10-CM | POA: Diagnosis present

## 2019-04-30 DIAGNOSIS — M40209 Unspecified kyphosis, site unspecified: Secondary | ICD-10-CM | POA: Diagnosis not present

## 2019-04-30 DIAGNOSIS — Z66 Do not resuscitate: Secondary | ICD-10-CM | POA: Diagnosis present

## 2019-04-30 DIAGNOSIS — Z7901 Long term (current) use of anticoagulants: Secondary | ICD-10-CM | POA: Diagnosis not present

## 2019-04-30 DIAGNOSIS — Z8249 Family history of ischemic heart disease and other diseases of the circulatory system: Secondary | ICD-10-CM

## 2019-04-30 DIAGNOSIS — R531 Weakness: Secondary | ICD-10-CM | POA: Diagnosis not present

## 2019-04-30 DIAGNOSIS — J189 Pneumonia, unspecified organism: Secondary | ICD-10-CM

## 2019-04-30 LAB — CBC WITH DIFFERENTIAL/PLATELET
Abs Immature Granulocytes: 0.25 10*3/uL — ABNORMAL HIGH (ref 0.00–0.07)
Basophils Absolute: 0.1 10*3/uL (ref 0.0–0.1)
Basophils Relative: 1 %
Eosinophils Absolute: 0 10*3/uL (ref 0.0–0.5)
Eosinophils Relative: 0 %
HCT: 44.1 % (ref 36.0–46.0)
Hemoglobin: 14.2 g/dL (ref 12.0–15.0)
Immature Granulocytes: 2 %
Lymphocytes Relative: 10 %
Lymphs Abs: 1.5 10*3/uL (ref 0.7–4.0)
MCH: 25.4 pg — ABNORMAL LOW (ref 26.0–34.0)
MCHC: 32.2 g/dL (ref 30.0–36.0)
MCV: 78.8 fL — ABNORMAL LOW (ref 80.0–100.0)
Monocytes Absolute: 0.7 10*3/uL (ref 0.1–1.0)
Monocytes Relative: 4 %
Neutro Abs: 13.3 10*3/uL — ABNORMAL HIGH (ref 1.7–7.7)
Neutrophils Relative %: 83 %
Platelets: 352 10*3/uL (ref 150–400)
RBC: 5.6 MIL/uL — ABNORMAL HIGH (ref 3.87–5.11)
RDW: 19.1 % — ABNORMAL HIGH (ref 11.5–15.5)
WBC: 15.9 10*3/uL — ABNORMAL HIGH (ref 4.0–10.5)
nRBC: 0 % (ref 0.0–0.2)

## 2019-04-30 LAB — URINALYSIS, COMPLETE (UACMP) WITH MICROSCOPIC
Bilirubin Urine: NEGATIVE
Glucose, UA: NEGATIVE mg/dL
Ketones, ur: NEGATIVE mg/dL
Nitrite: POSITIVE — AB
Protein, ur: NEGATIVE mg/dL
Specific Gravity, Urine: 1.008 (ref 1.005–1.030)
WBC, UA: >50 WBC/hpf — ABNORMAL HIGH (ref 0–5)
pH: 5 (ref 5.0–8.0)

## 2019-04-30 LAB — COMPREHENSIVE METABOLIC PANEL
ALT: 16 U/L (ref 0–44)
AST: 30 U/L (ref 15–41)
Albumin: 3.1 g/dL — ABNORMAL LOW (ref 3.5–5.0)
Alkaline Phosphatase: 121 U/L (ref 38–126)
Anion gap: 13 (ref 5–15)
BUN: 22 mg/dL (ref 8–23)
CO2: 28 mmol/L (ref 22–32)
Calcium: 8.5 mg/dL — ABNORMAL LOW (ref 8.9–10.3)
Chloride: 103 mmol/L (ref 98–111)
Creatinine, Ser: 0.93 mg/dL (ref 0.44–1.00)
GFR calc Af Amer: >60 mL/min (ref >60)
GFR calc non Af Amer: 58 mL/min — ABNORMAL LOW (ref >60)
Glucose, Bld: 110 mg/dL — ABNORMAL HIGH (ref 70–99)
Potassium: 3.5 mmol/L (ref 3.5–5.1)
Sodium: 144 mmol/L (ref 135–145)
Total Bilirubin: 0.7 mg/dL (ref 0.3–1.2)
Total Protein: 6.7 g/dL (ref 6.5–8.1)

## 2019-04-30 LAB — PROCALCITONIN: Procalcitonin: <0.1 ng/mL

## 2019-04-30 LAB — LACTIC ACID, PLASMA: Lactic Acid, Venous: 1.9 mmol/L (ref 0.5–1.9)

## 2019-04-30 LAB — LIPASE, BLOOD: Lipase: 18 U/L (ref 11–51)

## 2019-04-30 LAB — SARS CORONAVIRUS 2 BY RT PCR (HOSPITAL ORDER, PERFORMED IN ~~LOC~~ HOSPITAL LAB): SARS Coronavirus 2: NEGATIVE

## 2019-04-30 MED ORDER — ACETAMINOPHEN 325 MG PO TABS: 650.0000 mg | ORAL_TABLET | Freq: Four times a day (QID) | ORAL | Status: DC | PRN

## 2019-04-30 MED ORDER — ONDANSETRON HCL 4 MG/2ML IJ SOLN
4.0000 mg | Freq: Four times a day (QID) | INTRAMUSCULAR | Status: DC | PRN
  Administered 2019-05-02: 07:00:00 4 mg via INTRAVENOUS
  Filled 2019-04-30: qty 2

## 2019-04-30 MED ORDER — VITAMIN D 25 MCG (1000 UNIT) PO TABS
1000.0000 [IU] | ORAL_TABLET | Freq: Every day | ORAL | Status: DC
  Administered 2019-05-01 – 2019-05-03 (×2): 1000 [IU] via ORAL
  Filled 2019-04-30 (×2): qty 1

## 2019-04-30 MED ORDER — BOOST HIGH PROTEIN PO LIQD
1.0000 | Freq: Three times a day (TID) | ORAL | Status: DC
  Administered 2019-04-30: 1 via ORAL
  Filled 2019-04-30: qty 237

## 2019-04-30 MED ORDER — ACETAMINOPHEN 650 MG RE SUPP: 650.0000 mg | Freq: Four times a day (QID) | RECTAL | Status: DC | PRN

## 2019-04-30 MED ORDER — SODIUM CHLORIDE 0.9% FLUSH
3.0000 mL | Freq: Two times a day (BID) | INTRAVENOUS | Status: DC
  Administered 2019-04-30 – 2019-05-03 (×5): 3 mL via INTRAVENOUS

## 2019-04-30 MED ORDER — SODIUM CHLORIDE 0.9 % IV SOLN
2.0000 g | Freq: Once | INTRAVENOUS | Status: AC
  Administered 2019-04-30: 13:00:00 2 g via INTRAVENOUS
  Filled 2019-04-30: qty 2

## 2019-04-30 MED ORDER — SODIUM CHLORIDE 0.9 % IV SOLN
3.0000 g | Freq: Two times a day (BID) | INTRAVENOUS | Status: DC
  Administered 2019-04-30 – 2019-05-01 (×2): 3 g via INTRAVENOUS
  Filled 2019-04-30: qty 3
  Filled 2019-04-30: qty 8

## 2019-04-30 MED ORDER — TELOTRISTAT ETHYL(AS ETIPRATE) 250 MG PO TABS
1.0000 | ORAL_TABLET | Freq: Three times a day (TID) | ORAL | Status: DC
  Administered 2019-05-01: 1 via ORAL

## 2019-04-30 MED ORDER — SODIUM CHLORIDE 0.9 % IV SOLN
500.0000 mg | Freq: Once | INTRAVENOUS | Status: AC
  Administered 2019-04-30: 500 mg via INTRAVENOUS
  Filled 2019-04-30: qty 500

## 2019-04-30 MED ORDER — APIXABAN 2.5 MG PO TABS
2.5000 mg | ORAL_TABLET | Freq: Two times a day (BID) | ORAL | Status: DC
  Administered 2019-04-30 – 2019-05-03 (×5): 2.5 mg via ORAL
  Filled 2019-04-30 (×5): qty 1

## 2019-04-30 MED ORDER — ALBUTEROL SULFATE (2.5 MG/3ML) 0.083% IN NEBU: 2.5000 mg | INHALATION_SOLUTION | Freq: Four times a day (QID) | RESPIRATORY_TRACT | Status: DC | PRN

## 2019-04-30 MED ORDER — SODIUM CHLORIDE 0.9 % IV BOLUS
250.0000 mL | Freq: Once | INTRAVENOUS | Status: AC
  Administered 2019-04-30: 250 mL via INTRAVENOUS

## 2019-04-30 MED ORDER — SODIUM CHLORIDE 0.9% FLUSH: 3.0000 mL | INTRAVENOUS | Status: DC | PRN

## 2019-04-30 MED ORDER — ONDANSETRON HCL 4 MG PO TABS: 4.0000 mg | ORAL_TABLET | Freq: Four times a day (QID) | ORAL | Status: DC | PRN

## 2019-04-30 MED ORDER — LISINOPRIL 5 MG PO TABS
5.0000 mg | ORAL_TABLET | Freq: Every day | ORAL | Status: DC
  Filled 2019-04-30 (×2): qty 1

## 2019-04-30 MED ORDER — IOHEXOL 300 MG/ML  SOLN
75.0000 mL | Freq: Once | INTRAMUSCULAR | Status: AC | PRN
  Administered 2019-04-30: 75 mL via INTRAVENOUS

## 2019-04-30 MED ORDER — VITAMIN B-12 1000 MCG PO TABS
1000.0000 ug | ORAL_TABLET | Freq: Every day | ORAL | Status: DC
  Administered 2019-05-01 – 2019-05-03 (×2): 1000 ug via ORAL
  Filled 2019-04-30 (×2): qty 1

## 2019-04-30 MED ORDER — DABIGATRAN ETEXILATE MESYLATE 75 MG PO CAPS: 75.0000 mg | ORAL_CAPSULE | Freq: Two times a day (BID) | ORAL | Status: DC

## 2019-04-30 MED ORDER — INFLUENZA VAC A&B SA ADJ QUAD 0.5 ML IM PRSY
0.5000 mL | PREFILLED_SYRINGE | INTRAMUSCULAR | Status: AC
  Administered 2019-05-01: 0.5 mL via INTRAMUSCULAR
  Filled 2019-04-30: qty 0.5

## 2019-04-30 MED ORDER — SODIUM CHLORIDE 0.9 % IV SOLN
250.0000 mL | INTRAVENOUS | Status: DC | PRN
  Administered 2019-05-01 (×2): 250 mL via INTRAVENOUS

## 2019-04-30 NOTE — H&P (Signed)
Veronica Wang at [place] NAME: [name]    MR#:  [id number]  DATE OF BIRTH:  05-10-1936  DATE OF ADMISSION:  04/30/2019  PRIMARY CARE PHYSICIAN: Adin Hector, MD   REQUESTING/REFERRING PHYSICIAN: Merlyn Lot, MD  CHIEF COMPLAINT:   Chief Complaint  Patient presents with  . Dysphagia  . Headache    HISTORY OF PRESENT ILLNESS: Veronica Wang  is a 83 y.o. female with a known history of metastatic carcinoid tumor, congestive heart failure, COPD, GERD, hypertension, hyperlipidemia, macular degeneration who is presenting to the hospital with complaint of dysphagia and headaches.  Patient states that she has been having progressive swelling to trouble over the past few weeks.  In the emergency room she was admitted with CT scan of the chest which showed consolidation in her lungs.  Therefore it prompted ED physician to admit the patient.  Patient does complain of cough and constantly having to cough off stop.  Patient states that food gets stuck when she tries to swallow and is unable to follow the food down her throat.  When reviewing her notes from [facility] where she has received her care for her carcinoid tumor they have recommended patient to be followed by palliative care and have comfort care approach.  Patient and daughter state that they still want her mother to get the injections that she is getting at the oncologist office and does not want her to be followed by hospice because they are told that hospice told her that she would not be able to follow-up with any doctors.        PAST MEDICAL HISTORY:   Past Medical History:  Diagnosis Date  . Abnormal MRI 09/10/2015  . Atrial fibrillation ([place])   . Cancer ([place])    Liver  . Cancer ([place])    stomach  . Carcinoid tumor of intestine   . CHF (congestive heart failure) ([place])   . COPD (chronic obstructive pulmonary disease) ([place])   . FH: chemotherapy   . GERD (gastroesophageal reflux disease)    . Hyperlipidemia   . Hypertension   . Macular degeneration   . Polycythemia   . Radiation     PAST SURGICAL HISTORY:  Past Surgical History:  Procedure Laterality Date  . CATARACT EXTRACTION  splenectomy  . COLON RESECTION    . COLON SURGERY    . GASTRECTOMY    . SPLENECTOMY      SOCIAL HISTORY:  Social History   Tobacco Use  . Smoking status: Former Smoker    Types: Cigarettes  . Smokeless tobacco: Never Used  Substance Use Topics  . Alcohol use: No    Alcohol/week: 0.0 standard drinks    FAMILY HISTORY:  Family History  Problem Relation Age of Onset  . Heart attack Sister   . Heart attack Brother   . Leukemia Mother     DRUG ALLERGIES:  Allergies  Allergen Reactions  . No Known Allergies     REVIEW OF SYSTEMS:   CONSTITUTIONAL: No fever, fatigue or weakness.  EYES: No blurred or double vision.  EARS, NOSE, AND THROAT: No tinnitus or ear pain.  RESPIRATORY: Chronic cough, shortness of breath, wheezing or hemoptysis.  CARDIOVASCULAR: No chest pain, orthopnea, edema.  GASTROINTESTINAL: No nausea, vomiting, diarrhea or abdominal pain.  Positive dysphagia GENITOURINARY: No dysuria, hematuria.  ENDOCRINE: No polyuria, nocturia,  HEMATOLOGY: No anemia, easy bruising or bleeding SKIN: No rash or lesion. MUSCULOSKELETAL: No joint pain or arthritis.  NEUROLOGIC: No tingling, numbness, weakness.  PSYCHIATRY: No anxiety or depression.   MEDICATIONS AT HOME:  Prior to Admission medications   Medication Sig Start Date End Date Taking? Authorizing Provider  acetaminophen (TYLENOL) 325 MG tablet Take 2 tablets (650 mg total) by mouth every 6 (six) hours as needed for mild pain (or Fever >/= 101). 12/15/14   Tama High III, MD  albuterol (PROVENTIL HFA;VENTOLIN HFA) 108 (90 BASE) MCG/ACT inhaler Inhale 2 puffs into the lungs every 6 (six) hours as needed for wheezing or shortness of breath.    [provider]  cephALEXin (KEFLEX) 500 MG capsule Take 1  capsule (500 mg total) by mouth 2 (two) times daily. Patient not taking: Reported on 04/15/2019 03/17/19   Delman Kitten, MD  Cholecalciferol (VITAMIN D-1000 MAX ST) 1000 UNITS tablet Take 1,000 Units by mouth daily.     [provider]  Cyanocobalamin (VITAMIN B-12 CR) 1000 MCG TBCR Take 1 tablet by mouth daily.  03/02/11   [provider]  dabigatran (PRADAXA) 75 MG CAPS capsule Take 1 capsule (75 mg total) by mouth 2 (two) times daily. 11/04/18   Darylene Price A, FNP  ELIQUIS 2.5 MG TABS tablet 2.5 mg 2 (two) times daily.  04/09/19   [provider]  feeding supplement (BOOST HIGH PROTEIN) LIQD Take 1 Container by mouth 3 (three) times daily between meals.    [provider]  furosemide (LASIX) 20 MG tablet Take 40 mg by mouth daily.  09/02/18   [provider]  LANREOTIDE ACETATE [place] Inject into the skin.    [provider]  lisinopril (PRINIVIL,ZESTRIL) 5 MG tablet Take 5 mg by mouth daily. 06/18/18   [provider]  loperamide (IMODIUM A-D) 2 MG tablet Take 4 mg by mouth 4 (four) times daily as needed for diarrhea or loose stools.    [provider]  metoprolol tartrate (LOPRESSOR) 25 MG tablet Take 1 tablet (25 mg total) by mouth 2 (two) times daily. 11/04/18   Alisa Graff, FNP  Multiple Vitamins-Minerals (CENTRUM SILVER) tablet Take 1 tablet by mouth daily.  03/02/11   [provider]  ondansetron (ZOFRAN) 8 MG tablet Take 1 tablet (8 mg total) by mouth every 8 (eight) hours as needed for nausea or vomiting. 09/17/17   Jacquelin Hawking, NP  phenazopyridine (PYRIDIUM) 100 MG tablet Take 1 tablet (100 mg total) by mouth 2 (two) times daily as needed for pain (dysuria). 03/17/19 03/16/20  Delman Kitten, MD  potassium chloride (K-DUR) 10 MEQ tablet Take 10 mEq by mouth daily. 09/02/18   [provider]  sulfamethoxazole-trimethoprim (BACTRIM DS) 800-160 MG tablet Take 1 tablet by mouth every 12 (twelve) hours. Patient  not taking: Reported on 04/15/2019 03/21/19   Abbie Sons, MD  Telotristat Etiprate 250 MG TABS Take 1 tablet by mouth 3 (three) times daily. 05/27/18   [provider]      PHYSICAL EXAMINATION:   VITAL SIGNS: Blood pressure 125/88, pulse 97, temperature 97.6 F (36.4 C), temperature source Oral, resp. rate (!) 21, height 4\' 9"  (1.448 m), weight 39 kg, SpO2 100 %.  GENERAL:  83 y.o.-year-old patient lying in the bed very cachectic  EYES: Pupils equal, round, reactive to light and accommodation. No scleral icterus. Extraocular muscles intact.  HEENT: Head atraumatic, normocephalic. Oropharynx and nasopharynx clear.  NECK:  Supple, no jugular venous distention. No thyroid enlargement, no tenderness.  LUNGS: Normal breath sounds bilaterally, no wheezing, rales,rhonchi or crepitation. No  use of accessory muscles of respiration.  CARDIOVASCULAR: S1, S2 normal. No murmurs, rubs, or gallops.  ABDOMEN: Soft, nontender, nondistended. Bowel sounds present. No organomegaly or mass.  EXTREMITIES: No pedal edema, cyanosis, or clubbing.  NEUROLOGIC: Cranial nerves II through XII are intact. Muscle strength 5/5 in all extremities. Sensation intact. Gait not checked.  PSYCHIATRIC: The patient is alert and oriented x 3.  SKIN: No obvious rash, lesion, or ulcer.   LABORATORY PANEL:   CBC Recent Labs  Lab 04/30/19 1009  WBC 15.9*  HGB 14.2  HCT 44.1  PLT 352  MCV 78.8*  MCH 25.4*  MCHC 32.2  RDW 19.1*  LYMPHSABS 1.5  MONOABS 0.7  EOSABS 0.0  BASOSABS 0.1   ------------------------------------------------------------------------------------------------------------------  Chemistries  Recent Labs  Lab 04/30/19 1009  NA 144  K 3.5  CL 103  CO2 28  GLUCOSE 110*  BUN 22  CREATININE 0.93  CALCIUM 8.5*  AST 30  ALT 16  ALKPHOS 121  BILITOT 0.7   ------------------------------------------------------------------------------------------------------------------ estimated  creatinine clearance is 28.9 mL/min (by C-G formula based on SCr of 0.93 mg/dL). ------------------------------------------------------------------------------------------------------------------ No results for input(s): TSH, T4TOTAL, T3FREE, THYROIDAB in the last 72 hours.  Invalid input(s): FREET3   Coagulation profile No results for input(s): INR, PROTIME in the last 168 hours. ------------------------------------------------------------------------------------------------------------------- No results for input(s): DDIMER in the last 72 hours. -------------------------------------------------------------------------------------------------------------------  Cardiac Enzymes No results for input(s): CKMB, TROPONINI, MYOGLOBIN in the last 168 hours.  Invalid input(s): CK ------------------------------------------------------------------------------------------------------------------ Invalid input(s): POCBNP  ---------------------------------------------------------------------------------------------------------------  Urinalysis    Component Value Date/Time   COLORURINE YELLOW (A) 04/30/2019 1047   APPEARANCEUR CLOUDY (A) 04/30/2019 1047   APPEARANCEUR Cloudy (A) 03/21/2019 1108   LABSPEC 1.008 04/30/2019 1047   LABSPEC 1.013 06/12/2014 0935   PHURINE 5.0 04/30/2019 1047   GLUCOSEU NEGATIVE 04/30/2019 1047   GLUCOSEU Negative 06/12/2014 0935   HGBUR SMALL (A) 04/30/2019 [place] 04/30/2019 1047   BILIRUBINUR Negative 03/21/2019 1108   BILIRUBINUR Negative 06/12/2014 0935   KETONESUR NEGATIVE 04/30/2019 1047   PROTEINUR NEGATIVE 04/30/2019 1047   NITRITE POSITIVE (A) 04/30/2019 1047   LEUKOCYTESUR LARGE (A) 04/30/2019 1047   LEUKOCYTESUR Negative 06/12/2014 0935     RADIOLOGY: Ct Head Wo Contrast  Result Date: 04/30/2019 CLINICAL DATA:  83 year old female with unexplained altered mental status. Right side headache for 2 days. Difficulty  swallowing. 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 EXAM: CT HEAD WITHOUT CONTRAST TECHNIQUE: Contiguous axial images were obtained from the base of the skull through the vertex without intravenous contrast. COMPARISON:  Brain MRI 08/14/2018. Head CT 08/21/2018. FINDINGS: Brain: Left hemisphere encephalomalacia as expected from the February infarct. Superimposed confluent left deep white matter capsules small vessel related hypodensity. Elsewhere Stable gray-white matter differentiation throughout the brain. Stable cerebral volume. No midline shift, ventriculomegaly, mass effect, evidence of mass lesion, intracranial hemorrhage or cortically based infarction. Vascular: Calcified atherosclerosis at the skull base. New No suspicious intracranial vascular hyperdensity. Skull: Osteopenia.  No acute osseous abnormality identified. Sinuses/Orbits: Small fluid level in the left sphenoid sinus today where there was bubbly opacity previously. Elsewhere Visualized paranasal sinuses and mastoids are stable and well pneumatized. Other: No acute orbit or scalp soft tissue findings. IMPRESSION: 1. No acute intracranial abnormality. 2. Expected evolution of the left hemisphere infarct since February. Underlying chronic small vessel disease. 3. Mild inflammation in the left sphenoid sinus. Electronically Signed   By: Genevie Ann M.D.   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 EXAM: CT CHEST, ABDOMEN, AND PELVIS WITH CONTRAST TECHNIQUE: Multidetector CT imaging of the chest, abdomen and pelvis was performed following the standard protocol during bolus administration of intravenous contrast. CONTRAST:  34mL OMNIPAQUE IOHEXOL 300 MG/ML  SOLN COMPARISON:  03/17/2019, 07/18/2018 FINDINGS: CT CHEST FINDINGS Cardiovascular: Mild prominence of the ascending aorta 3.5 cm is noted. No dissection is  seen. Atherosclerotic calcifications are noted. Mild cardiomegaly is seen. The pulmonary artery as visualized is within normal limits. Coronary calcifications are noted. Mediastinum/Nodes: Thoracic inlet is within normal limits. No sizable hilar or mediastinal adenopathy is noted. The esophagus is within normal limits. Lungs/Pleura: Lungs demonstrate diffuse emphysematous changes with left lower lobe consolidation and mild effusion. This is stable from the prior exam. No sizable parenchymal nodules are noted. Musculoskeletal: Degenerative changes of the thoracic spine are noted with increased kyphosis. Focal sclerosis is noted in the anterior aspect of the T10 vertebral body which was not seen on a prior exam from 2015. Possibility of metastatic disease deserves consideration. CT ABDOMEN PELVIS FINDINGS Hepatobiliary: The liver again demonstrates multiple enhancing lesions the largest of which lies within the left lobe of the liver. These are increased in number when compared with the prior exam consistent with progressive disease. The largest of these again lies within the left lobe of the liver. Gallbladder is decompressed. Pancreas: Mild prominence of the pancreatic duct is again noted and stable. Spleen: Normal in size without focal abnormality. Adrenals/Urinary Tract: Adrenal glands are within normal limits. The kidneys are well visualized. Previously seen right-sided hydronephrosis is stable from the recent noncontrast study of 03/17/2019. No obstructing stone is seen. The bladder is predominately decompressed. Stomach/Bowel: No obstructive or inflammatory changes of the bowel are noted. Postsurgical changes are seen. The appendix is not well appreciated. Vascular/Lymphatic: Vascular calcifications are noted. The previously seen hypodense lesion along the left margin of the liver has decreased in size now measuring approximately 11 mm in short axis. Persistent nodal mass is noted along the iliac chains  bilaterally. This is best seen on the right on image number 85 of series 2 and on the left on image number 92 of series 2. These changes are stable from the most recent noncontrast study. Central partially calcified mass within the mesentery along the anterior aspect of the aorta is stable in appearance. Reproductive: Postmenopausal uterus is noted with multiple surrounding veins similar to that seen on the prior exam. Other: No free fluid is noted.  No herniation is seen. Musculoskeletal: Stable sclerotic lesions are noted within the lumbar spine and sacrum as well as the left iliac bone. Stable compression deformities of L3 and L5 are noted. IMPRESSION: Increase in number of metastatic lesions within the liver when compared with the prior examination from January of 2020. The lack of IV contrast on the most recent exam precluded adequate evaluation. Persistent severe hydronephrosis on the right likely related to the nodal mass seen in the pelvis. Partially calcified mass lesion consistent with the known history of carcinoid within the central mesentery. Persistent lymphadenopathy is noted within the pelvis stable from the most recent noncontrast study. Chronic lower lobe consolidation on the left with small effusion. Sclerotic foci within the bony structures consistent with metastatic disease stable within the abdomen and pelvis. A lesion is noted at T10 which has not been recently evaluated but is likely chronic in nature. Electronically Signed   By:  Inez Catalina M.D.   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 EXAM: CT CHEST, ABDOMEN, AND PELVIS WITH CONTRAST TECHNIQUE: Multidetector CT imaging of the chest, abdomen and pelvis was performed following the standard protocol during bolus administration of intravenous contrast. CONTRAST:  17mL OMNIPAQUE IOHEXOL  300 MG/ML  SOLN COMPARISON:  03/17/2019, 07/18/2018 FINDINGS: CT CHEST FINDINGS Cardiovascular: Mild prominence of the ascending aorta 3.5 cm is noted. No dissection is seen. Atherosclerotic calcifications are noted. Mild cardiomegaly is seen. The pulmonary artery as visualized is within normal limits. Coronary calcifications are noted. Mediastinum/Nodes: Thoracic inlet is within normal limits. No sizable hilar or mediastinal adenopathy is noted. The esophagus is within normal limits. Lungs/Pleura: Lungs demonstrate diffuse emphysematous changes with left lower lobe consolidation and mild effusion. This is stable from the prior exam. No sizable parenchymal nodules are noted. Musculoskeletal: Degenerative changes of the thoracic spine are noted with increased kyphosis. Focal sclerosis is noted in the anterior aspect of the T10 vertebral body which was not seen on a prior exam from 2015. Possibility of metastatic disease deserves consideration. CT ABDOMEN PELVIS FINDINGS Hepatobiliary: The liver again demonstrates multiple enhancing lesions the largest of which lies within the left lobe of the liver. These are increased in number when compared with the prior exam consistent with progressive disease. The largest of these again lies within the left lobe of the liver. Gallbladder is decompressed. Pancreas: Mild prominence of the pancreatic duct is again noted and stable. Spleen: Normal in size without focal abnormality. Adrenals/Urinary Tract: Adrenal glands are within normal limits. The kidneys are well visualized. Previously seen right-sided hydronephrosis is stable from the recent noncontrast study of 03/17/2019. No obstructing stone is seen. The bladder is predominately decompressed. Stomach/Bowel: No obstructive or inflammatory changes of the bowel are noted. Postsurgical changes are seen. The appendix is not well appreciated. Vascular/Lymphatic: Vascular calcifications are noted. The previously seen hypodense  lesion along the left margin of the liver has decreased in size now measuring approximately 11 mm in short axis. Persistent nodal mass is noted along the iliac chains bilaterally. This is best seen on the right on image number 85 of series 2 and on the left on image number 92 of series 2. These changes are stable from the most recent noncontrast study. Central partially calcified mass within the mesentery along the anterior aspect of the aorta is stable in appearance. Reproductive: Postmenopausal uterus is noted with multiple surrounding veins similar to that seen on the prior exam. Other: No free fluid is noted.  No herniation is seen. Musculoskeletal: Stable sclerotic lesions are noted within the lumbar spine and sacrum as well as the left iliac bone. Stable compression deformities of L3 and L5 are noted. IMPRESSION: Increase in number of metastatic lesions within the liver when compared with the prior examination from January of 2020. The lack of IV contrast on the most recent exam precluded adequate evaluation. Persistent severe hydronephrosis on the right likely related to the nodal mass seen in the pelvis. Partially calcified mass lesion consistent with the known history of carcinoid within the central mesentery. Persistent lymphadenopathy is noted within the pelvis stable from the most recent noncontrast study. Chronic lower lobe consolidation on the left with small effusion. Sclerotic foci within the bony structures consistent with metastatic disease stable within the abdomen and pelvis. A lesion is noted at T10  which has not been recently evaluated but is likely chronic in nature. Electronically Signed   By: Inez Catalina M.D.   On: 04/30/2019 11:38   Dg Chest Portable 1 View  Result Date: 04/30/2019 CLINICAL DATA:  Patient reports right side headache and trouble swallowing x2 days. Patient also reports decreased taste. Hx of COPD, CHF, atrial fibrillation. Former smoker EXAM: PORTABLE CHEST - 1 VIEW  COMPARISON:  03/17/2019 FINDINGS: Persistent small left pleural effusion with adjacent consolidation/atelectasis at the left lung base. No overt interstitial edema. No new infiltrate. Heart size upper limits normal for technique. Aortic Atherosclerosis (ICD10-170.0). No pneumothorax. Visualized bones unremarkable. IMPRESSION: Persistent small left pleural effusion with adjacent atelectasis/consolidation. No acute findings. Electronically Signed   By: Lucrezia Europe M.D.   On: 04/30/2019 10:31    EKG: Orders placed or performed during the hospital encounter of 10/26/18  . ED EKG  . ED EKG  . EKG 12-Lead  . EKG 12-Lead  . EKG    IMPRESSION AND PLAN: Patient is 83 year old presenting with dysphagia noted to have a possible pneumonia  1.  Aspiration pneumonia I will treat with IV Unasyn check a procalcitonin level.  We will also have speech see the patient.  2.  Dysphagia need to rule out esophageal stricture I will do an upper GI series for tomorrow  3.  Carcinoid tumor prognosis very poor discussed with the patient daughter recommended hospice services  4.  COPD without exasperation we will use nebs as needed  5.  Essential hypertension continue metoprolol and lisinopril  6.  Chronic CHF currently compensated continue Lasix and lisinopril   All the records are reviewed and case discussed with ED provider. Management plans discussed with the patient, family and they are in agreement.  CODE STATUS: Code Status History    Date Active Date Inactive Code Status Order ID Comments User Context   10/27/2018 1026 10/30/2018 1711 DNR [id number]  Bettey Costa, MD Inpatient   10/26/2018 2000 10/27/2018 1026 Full Code [id number]  Saundra Shelling, MD Inpatient   08/14/2018 0300 08/15/2018 1605 Full Code [id number]  Lance Coon, MD Inpatient   12/15/2014 1933 01/06/2015 1806 Full Code [id number]  Corey Harold Inpatient   12/05/2014 0340 12/15/2014 1933 Full Code [id number]  Juluis Mire, MD Inpatient    Advance Care Planning Activity    Questions for Most Recent Historical Code Status (Order [id number])    Question Answer Comment   In the event of cardiac or respiratory ARREST Do not call a "code blue"    In the event of cardiac or respiratory ARREST Do not perform Intubation, CPR, defibrillation or ACLS    In the event of cardiac or respiratory ARREST Use medication by any route, position, wound care, and other measures to relive pain and suffering. May use oxygen, suction and manual treatment of airway obstruction as needed for comfort.        TOTAL TIME TAKING CARE OF THIS PATIENT:55 minutes.    Dustin Flock M.D on 04/30/2019 at 12:57 PM  Between 7am to 6pm - Pager - [phone]  After 6pm go to [email or web address] - password [email or web address] [facility]  [phone]  CC: Primary care physician; Adin Hector, MD

## 2019-04-30 NOTE — ED Provider Notes (Signed)
[facility] Emergency Department Provider Note    First MD Initiated Contact with Patient 04/30/19 1007     (approximate)  I have reviewed the triage vital signs and the nursing notes.   HISTORY  Chief Complaint Dysphagia and Headache    HPI Veronica Wang is a 83 y.o. female extensive past medical history as listed below presents the ER for headache as well as trouble swallowing for the past several days.   Does wear chronic O2.  Is also been having a headache.  Does take Eliquis for A. fib.  States that she not able to keep any fluids down.  Says she has no taste for food.   Past Medical History:  Diagnosis Date  . Abnormal MRI 09/10/2015  . Atrial fibrillation ([place])   . Cancer ([place])    Liver  . Cancer ([place])    stomach  . Carcinoid tumor of intestine   . CHF (congestive heart failure) ([place])   . COPD (chronic obstructive pulmonary disease) ([place])   . FH: chemotherapy   . GERD (gastroesophageal reflux disease)   . Hyperlipidemia   . Hypertension   . Macular degeneration   . Polycythemia   . Radiation    Family History  Problem Relation Age of Onset  . Heart attack Sister   . Heart attack Brother   . Leukemia Mother    Past Surgical History:  Procedure Laterality Date  . CATARACT EXTRACTION  splenectomy  . COLON RESECTION    . COLON SURGERY    . GASTRECTOMY    . SPLENECTOMY     Patient Active Problem List   Diagnosis Date Noted  . Lower limb ulcer, calf ([place]) 03/25/2019  . Swelling of limb 03/25/2019  . Gross hematuria 03/23/2019  . Acute exacerbation of CHF (congestive heart failure) ([place]) 10/26/2018  . Protein-calorie malnutrition, severe 08/14/2018  . Stroke ([place]) 08/13/2018  . HTN (hypertension) 08/13/2018  . HLD (hyperlipidemia) 08/13/2018  . Chronic combined systolic (congestive) and diastolic (congestive) heart failure ([place]) 11/27/2017  . PAF (paroxysmal atrial fibrillation) ([place]) 11/27/2017  . Abnormal MRI 09/10/2015   . Malnutrition of moderate degree ([place]) 12/06/2014  . Pneumonia 12/05/2014  . COPD (chronic obstructive pulmonary disease) ([place]) 12/05/2014  . 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 Authorizing Provider  acetaminophen (TYLENOL) 325 MG tablet Take 2 tablets (650 mg total) by mouth every 6 (six) hours as needed for mild pain (or Fever >/= 101). 12/15/14   Tama High III, MD  albuterol (PROVENTIL HFA;VENTOLIN HFA) 108 (90 BASE) MCG/ACT inhaler Inhale 2 puffs into the lungs every 6 (six) hours as needed for wheezing or shortness of breath.    [provider]  cephALEXin (KEFLEX) 500 MG capsule Take 1 capsule (500 mg total) by mouth 2 (two) times daily. Patient not taking: Reported on 04/15/2019 03/17/19   Delman Kitten, MD  Cholecalciferol (VITAMIN D-1000 MAX ST) 1000 UNITS tablet Take 1,000 Units by mouth daily.     [provider]  Cyanocobalamin (VITAMIN B-12 CR) 1000 MCG TBCR Take 1 tablet by mouth daily.  03/02/11   [provider]  dabigatran (PRADAXA) 75 MG CAPS capsule Take 1 capsule (75 mg total) by mouth 2 (two) times daily. 11/04/18   Darylene Price A, FNP  ELIQUIS 2.5 MG TABS tablet 2.5 mg 2 (two) times daily.  04/09/19   [provider]  feeding supplement (BOOST HIGH PROTEIN) LIQD Take 1 Container by mouth 3 (three) times daily between meals.    [provider]  furosemide (LASIX) 20 MG tablet Take 40 mg by mouth daily.  09/02/18   [provider]  LANREOTIDE ACETATE [place] Inject into the skin.    [provider]  lisinopril (PRINIVIL,ZESTRIL) 5 MG tablet Take 5 mg by mouth daily. 06/18/18   [provider]  loperamide (IMODIUM A-D) 2 MG tablet Take 4 mg by mouth 4 (four) times daily as needed for diarrhea or loose stools.    [provider]  metoprolol tartrate (LOPRESSOR) 25 MG tablet Take 1 tablet (25 mg total) by mouth 2 (two)  times daily. 11/04/18   Alisa Graff, FNP  Multiple Vitamins-Minerals (CENTRUM SILVER) tablet Take 1 tablet by mouth daily.  03/02/11   [provider]  ondansetron (ZOFRAN) 8 MG tablet Take 1 tablet (8 mg total) by mouth every 8 (eight) hours as needed for nausea or vomiting. 09/17/17   Jacquelin Hawking, NP  phenazopyridine (PYRIDIUM) 100 MG tablet Take 1 tablet (100 mg total) by mouth 2 (two) times daily as needed for pain (dysuria). 03/17/19 03/16/20  Delman Kitten, MD  potassium chloride (K-DUR) 10 MEQ tablet Take 10 mEq by mouth daily. 09/02/18   [provider]  sulfamethoxazole-trimethoprim (BACTRIM DS) 800-160 MG tablet Take 1 tablet by mouth every 12 (twelve) hours. Patient not taking: Reported on 04/15/2019 03/21/19   Abbie Sons, MD  Telotristat Etiprate 250 MG TABS Take 1 tablet by mouth 3 (three) times daily. 05/27/18   [provider]    Allergies No known allergies    Social History Social History   Tobacco Use  . Smoking status: Former Smoker    Types: Cigarettes  . Smokeless tobacco: Never Used  Substance Use Topics  . Alcohol use: No    Alcohol/week: 0.0 standard drinks  . Drug use: No    Review of Systems Patient denies headaches, rhinorrhea, blurry vision, numbness, shortness of breath, chest pain, edema, cough, abdominal pain, nausea, vomiting, diarrhea, dysuria, fevers, rashes or hallucinations unless otherwise stated above in HPI. ____________________________________________   PHYSICAL EXAM:  VITAL SIGNS: Vitals:   04/30/19 1155 04/30/19 1200  BP: 107/79 125/88  Pulse: (!) 103 97  Resp: 20 (!) 21  Temp:    SpO2: 100% 100%    Constitutional: Alert and oriented. Very frail and weak appearing Eyes: Conjunctivae are normal.  Head: Atraumatic. Nose: No congestion/rhinnorhea. Mouth/Throat: Mucous membranes are dry   Neck: No stridor. Painless ROM.  Cardiovascular: Normal rate, regular rhythm. Grossly normal heart sounds.   Good peripheral circulation. Respiratory: Normal respiratory effort.  No retractions. Lungs CTAB. Gastrointestinal: Soft and nontender. No distention. No abdominal bruits. No CVA tenderness. Genitourinary:  Musculoskeletal: No lower extremity tenderness nor edema.  No joint effusions. Neurologic:  Normal speech and language. No gross focal neurologic deficits are appreciated. No facial droop Skin:  Skin is warm, dry and intact. No rash noted. Psychiatric: Mood and affect are normal. Speech and behavior are normal.  ____________________________________________   LABS (all labs ordered are listed, but only abnormal results are displayed)  Results for orders placed or performed during the hospital encounter of 04/30/19 (from the past 24 hour(s))  CBC with Differential/Platelet     Status: Abnormal   Collection Time: 04/30/19 10:09 AM  Result Value Ref Range   WBC 15.9 (H) 4.0 - 10.5 K/uL   RBC 5.60 (H) 3.87 -  5.11 MIL/uL   Hemoglobin 14.2 12.0 - 15.0 g/dL   HCT 44.1 36.0 - 46.0 %   MCV 78.8 (L) 80.0 - 100.0 fL   MCH 25.4 (L) 26.0 - 34.0 pg   MCHC 32.2 30.0 - 36.0 g/dL   RDW 19.1 (H) 11.5 - 15.5 %   Platelets 352 150 - 400 K/uL   nRBC 0.0 0.0 - 0.2 %   Neutrophils Relative % 83 %   Neutro Abs 13.3 (H) 1.7 - 7.7 K/uL   Lymphocytes Relative 10 %   Lymphs Abs 1.5 0.7 - 4.0 K/uL   Monocytes Relative 4 %   Monocytes Absolute 0.7 0.1 - 1.0 K/uL   Eosinophils Relative 0 %   Eosinophils Absolute 0.0 0.0 - 0.5 K/uL   Basophils Relative 1 %   Basophils Absolute 0.1 0.0 - 0.1 K/uL   Immature Granulocytes 2 %   Abs Immature Granulocytes 0.25 (H) 0.00 - 0.07 K/uL  Comprehensive metabolic panel     Status: Abnormal   Collection Time: 04/30/19 10:09 AM  Result Value Ref Range   Sodium 144 135 - 145 mmol/L   Potassium 3.5 3.5 - 5.1 mmol/L   Chloride 103 98 - 111 mmol/L   CO2 28 22 - 32 mmol/L   Glucose, Bld 110 (H) 70 - 99 mg/dL   BUN 22 8 - 23 mg/dL   Creatinine, Ser 0.93 0.44 - 1.00  mg/dL   Calcium 8.5 (L) 8.9 - 10.3 mg/dL   Total Protein 6.7 6.5 - 8.1 g/dL   Albumin 3.1 (L) 3.5 - 5.0 g/dL   AST 30 15 - 41 U/L   ALT 16 0 - 44 U/L   Alkaline Phosphatase 121 38 - 126 U/L   Total Bilirubin 0.7 0.3 - 1.2 mg/dL   GFR calc non Af Amer 58 (L) >60 mL/min   GFR calc Af Amer >60 >60 mL/min   Anion gap 13 5 - 15  Lipase, blood     Status: None   Collection Time: 04/30/19 10:09 AM  Result Value Ref Range   Lipase 18 11 - 51 U/L  Urinalysis, Complete w Microscopic     Status: Abnormal   Collection Time: 04/30/19 10:47 AM  Result Value Ref Range   Color, Urine YELLOW (A) YELLOW   APPearance CLOUDY (A) CLEAR   Specific Gravity, Urine 1.008 1.005 - 1.030   pH 5.0 5.0 - 8.0   Glucose, UA NEGATIVE NEGATIVE mg/dL   Hgb urine dipstick SMALL (A) NEGATIVE   Bilirubin Urine NEGATIVE NEGATIVE   Ketones, ur NEGATIVE NEGATIVE mg/dL   Protein, ur NEGATIVE NEGATIVE mg/dL   Nitrite POSITIVE (A) NEGATIVE   Leukocytes,Ua LARGE (A) NEGATIVE   RBC / HPF 21-50 0 - 5 RBC/hpf   WBC, UA >50 (H) 0 - 5 WBC/hpf   Bacteria, UA RARE (A) NONE SEEN   Squamous Epithelial / LPF 0-5 0 - 5   Mucus PRESENT    Hyaline Casts, UA PRESENT   SARS Coronavirus 2 by RT PCR (hospital order, performed in [place] hospital lab) Nasopharyngeal Nasopharyngeal Swab     Status: None   Collection Time: 04/30/19 10:47 AM   Specimen: Nasopharyngeal Swab  Result Value Ref Range   SARS Coronavirus 2 NEGATIVE NEGATIVE   ____________________________________________ ____________________________________________  RADIOLOGY  I personally reviewed all radiographic images ordered to evaluate for the above acute complaints and reviewed radiology reports and findings.  These findings were personally discussed with the patient.  Please see medical  record for radiology report.  ____________________________________________   PROCEDURES  Procedure(s) performed:  Procedures    Critical Care performed: no  ____________________________________________   INITIAL IMPRESSION / ASSESSMENT AND PLAN / ED COURSE  Pertinent labs & imaging results that were available during my care of the patient were reviewed by me and considered in my medical decision making (see chart for details).   DDX: Dehydration, electrolyte abnormality, cva, tia, SBO, food bolus impaction, [place], UTI, mass, SBO, pneumonia, aspiration  Hiedi Talluto is a 83 y.o. who presents to the ED with extensive and complex past medical history presents the ER with symptoms as described above.  Will give gentle IV hydration.  Blood work as well as imaging will be ordered for by differential.  Does not have any fever.  She appears chronically frail.  Clinical Course as of Apr 29 1248  Wed Apr 30, 2019  1240 Had extensive discussion regarding results of her CT imaging with patient and daughter at bedside.  Has known for quite some time about the persistent hydronephrosis and has had urinary tract infections treated with antibiotics.  They had declined nephrostomy tube stent placement in the past.  They are uncertain as to whether they would want to proceed at this time.  I do feel she will require hospitalization for IV fluids IV antibiotics and medical management.   [PR]    Clinical Course User Index [PR] Merlyn Lot, MD    The patient was evaluated in Emergency Department today for the symptoms described in the history of present illness. He/she was evaluated in the context of the global COVID-19 pandemic, which necessitated consideration that the patient might be at risk for infection with the SARS-CoV-2 virus that causes COVID-19. Institutional protocols and algorithms that pertain to the evaluation of patients at risk for COVID-19 are in a state of rapid change based on information released by regulatory bodies including the CDC and federal and state organizations. These policies and algorithms were followed during the patient's care  in the ED.  As part of my medical decision making, I reviewed the following data within the [facility] notes reviewed and incorporated, Labs reviewed, notes from prior ED visits and [place] Controlled Substance Database   ____________________________________________   FINAL CLINICAL IMPRESSION(S) / ED DIAGNOSES  Final diagnoses:  Pneumonia of left lower lobe due to infectious organism  Hydronephrosis, unspecified hydronephrosis type      NEW MEDICATIONS STARTED DURING THIS VISIT:  New Prescriptions   No medications on file     Note:  This document was prepared using Dragon voice recognition software and may include unintentional dictation errors.    Merlyn Lot, MD 04/30/19 1346

## 2019-04-30 NOTE — ED Notes (Signed)
Assumed care of patient. Patient off unit to ct abd. Family at bedside.

## 2019-04-30 NOTE — ED Notes (Signed)
ED TO INPATIENT HANDOFF REPORT  ED Nurse Name and Phone #:   S Name/Age/Gender Veronica Wang 83 y.o. female Room/Bed: ED14A/ED14A  Code Status   Code Status: DNR  Home/SNF/Other Home Patient oriented to: self, place, time and situation Is this baseline? Yes   Triage Complete: Triage complete  Chief Complaint dysphagia, headache  Triage Note Patient reports right side headache and trouble swallowing x2 days. Patient also reports decreased taste. Patient with equal sensation and strength noted in triage.    Allergies Allergies  Allergen Reactions  . No Known Allergies     Level of Care/Admitting Diagnosis ED Disposition    ED Disposition Condition [facility] Area: [place] [[id number]]  Level of Care: Med-Surg [16]  Covid Evaluation: Asymptomatic Screening Protocol (No Symptoms)  Diagnosis: Aspiration pneumonia [facility]) [id number]  Admitting Physician: Dustin Flock [id number]  Attending Physician: Dustin Flock [phone]  Estimated length of stay: past midnight tomorrow  Certification:: I certify this patient will need inpatient services for at least 2 midnights  PT Class (Do Not Modify): Inpatient [101]  PT Acc Code (Do Not Modify): Private [1]       B Medical/Surgery History Past Medical History:  Diagnosis Date  . Abnormal MRI 09/10/2015  . Atrial fibrillation ([place])   . Cancer ([place])    Liver  . Cancer ([place])    stomach  . Carcinoid tumor of intestine   . CHF (congestive heart failure) ([place])   . COPD (chronic obstructive pulmonary disease) ([place])   . FH: chemotherapy   . GERD (gastroesophageal reflux disease)   . Hyperlipidemia   . Hypertension   . Macular degeneration   . Polycythemia   . Radiation    Past Surgical History:  Procedure Laterality Date  . CATARACT EXTRACTION  splenectomy  . COLON RESECTION    . COLON SURGERY    . GASTRECTOMY    . SPLENECTOMY       A IV Location/Drains/Wounds Patient  Lines/Drains/Airways Status   Active Line/Drains/Airways    Name:   Placement date:   Placement time:   Site:   Days:   Peripheral IV 04/30/19 Left Forearm   04/30/19    1008    Forearm   less than 1          Intake/Output Last 24 hours  Intake/Output Summary (Last 24 hours) at 04/30/2019 1744 Last data filed at 04/30/2019 1549 Gross per 24 hour  Intake 350 ml  Output -  Net 350 ml    Labs/Imaging Results for orders placed or performed during the hospital encounter of 04/30/19 (from the past 48 hour(s))  CBC with Differential/Platelet     Status: Abnormal   Collection Time: 04/30/19 10:09 AM  Result Value Ref Range   WBC 15.9 (H) 4.0 - 10.5 K/uL   RBC 5.60 (H) 3.87 - 5.11 MIL/uL   Hemoglobin 14.2 12.0 - 15.0 g/dL   HCT 44.1 36.0 - 46.0 %   MCV 78.8 (L) 80.0 - 100.0 fL   MCH 25.4 (L) 26.0 - 34.0 pg   MCHC 32.2 30.0 - 36.0 g/dL   RDW 19.1 (H) 11.5 - 15.5 %   Platelets 352 150 - 400 K/uL   nRBC 0.0 0.0 - 0.2 %   Neutrophils Relative % 83 %   Neutro Abs 13.3 (H) 1.7 - 7.7 K/uL   Lymphocytes Relative 10 %   Lymphs Abs 1.5 0.7 - 4.0 K/uL   Monocytes Relative 4 %  Monocytes Absolute 0.7 0.1 - 1.0 K/uL   Eosinophils Relative 0 %   Eosinophils Absolute 0.0 0.0 - 0.5 K/uL   Basophils Relative 1 %   Basophils Absolute 0.1 0.0 - 0.1 K/uL   Immature Granulocytes 2 %   Abs Immature Granulocytes 0.25 (H) 0.00 - 0.07 K/uL    Comment: Performed at [facility], [address] [place], [place] [address]  Comprehensive metabolic panel     Status: Abnormal   Collection Time: 04/30/19 10:09 AM  Result Value Ref Range   Sodium 144 135 - 145 mmol/L   Potassium 3.5 3.5 - 5.1 mmol/L   Chloride 103 98 - 111 mmol/L   CO2 28 22 - 32 mmol/L   Glucose, Bld 110 (H) 70 - 99 mg/dL   BUN 22 8 - 23 mg/dL   Creatinine, Ser 0.93 0.44 - 1.00 mg/dL   Calcium 8.5 (L) 8.9 - 10.3 mg/dL   Total Protein 6.7 6.5 - 8.1 g/dL   Albumin 3.1 (L) 3.5 - 5.0 g/dL   AST 30 15 - 41 U/L   ALT 16 0 -  44 U/L   Alkaline Phosphatase 121 38 - 126 U/L   Total Bilirubin 0.7 0.3 - 1.2 mg/dL   GFR calc non Af Amer 58 (L) >60 mL/min   GFR calc Af Amer >60 >60 mL/min   Anion gap 13 5 - 15    Comment: Performed at [facility], [address] [place], [place] [address]  Lipase, blood     Status: None   Collection Time: 04/30/19 10:09 AM  Result Value Ref Range   Lipase 18 11 - 51 U/L    Comment: Performed at [facility], [address] [place], [place] [address]  Urinalysis, Complete w Microscopic     Status: Abnormal   Collection Time: 04/30/19 10:47 AM  Result Value Ref Range   Color, Urine YELLOW (A) YELLOW   APPearance CLOUDY (A) CLEAR   Specific Gravity, Urine 1.008 1.005 - 1.030   pH 5.0 5.0 - 8.0   Glucose, UA NEGATIVE NEGATIVE mg/dL   Hgb urine dipstick SMALL (A) NEGATIVE   Bilirubin Urine NEGATIVE NEGATIVE   Ketones, ur NEGATIVE NEGATIVE mg/dL   Protein, ur NEGATIVE NEGATIVE mg/dL   Nitrite POSITIVE (A) NEGATIVE   Leukocytes,Ua LARGE (A) NEGATIVE   RBC / HPF 21-50 0 - 5 RBC/hpf   WBC, UA >50 (H) 0 - 5 WBC/hpf   Bacteria, UA RARE (A) NONE SEEN   Squamous Epithelial / LPF 0-5 0 - 5   Mucus PRESENT    Hyaline Casts, UA PRESENT     Comment: Performed at [facility], [address] [place], [place] [address]  SARS Coronavirus 2 by RT PCR (hospital order, performed in [place] hospital lab) Nasopharyngeal Nasopharyngeal Swab     Status: None   Collection Time: 04/30/19 10:47 AM   Specimen: Nasopharyngeal Swab  Result Value Ref Range   SARS Coronavirus 2 NEGATIVE NEGATIVE    Comment: (NOTE) If result is NEGATIVE SARS-CoV-2 target nucleic acids are NOT DETECTED. The SARS-CoV-2 RNA is generally detectable in upper and lower  respiratory specimens during the acute phase of infection. The lowest  concentration of SARS-CoV-2 viral copies this assay can detect is 250  copies / mL. A negative result does not preclude SARS-CoV-2 infection  and  should not be used as the sole basis for treatment or other  patient management decisions.  A negative result may  occur with  improper specimen collection / handling, submission of specimen other  than nasopharyngeal swab, presence of viral mutation(s) within the  areas targeted by this assay, and inadequate number of viral copies  (<250 copies / mL). A negative result must be combined with clinical  observations, patient history, and epidemiological information. If result is POSITIVE SARS-CoV-2 target nucleic acids are DETECTED. The SARS-CoV-2 RNA is generally detectable in upper and lower  respiratory specimens dur ing the acute phase of infection.  Positive  results are indicative of active infection with SARS-CoV-2.  Clinical  correlation with patient history and other diagnostic information is  necessary to determine patient infection status.  Positive results do  not rule out bacterial infection or co-infection with other viruses. If result is PRESUMPTIVE POSTIVE SARS-CoV-2 nucleic acids MAY BE PRESENT.   A presumptive positive result was obtained on the submitted specimen  and confirmed on repeat testing.  While 2019 novel coronavirus  (SARS-CoV-2) nucleic acids may be present in the submitted sample  additional confirmatory testing may be necessary for epidemiological  and / or clinical management purposes  to differentiate between  SARS-CoV-2 and other Sarbecovirus currently known to infect humans.  If clinically indicated additional testing with an alternate test  methodology [phone]) is advised. The SARS-CoV-2 RNA is generally  detectable in upper and lower respiratory sp ecimens during the acute  phase of infection. The expected result is Negative. Fact Sheet for Patients:  StrictlyIdeas.no Fact Sheet for Healthcare Providers: BankingDealers.co.za This test is not yet approved or cleared by the [place] FDA and has been  authorized for detection and/or diagnosis of SARS-CoV-2 by FDA under an Emergency Use Authorization (EUA).  This EUA will remain in effect (meaning this test can be used) for the duration of the COVID-19 declaration under Section 564(b)(1) of the Act, 21 U.S.C. section 360bbb-3(b)(1), unless the authorization is terminated or revoked sooner. Performed at [facility], [place], [place] [address]   Lactic acid, plasma     Status: None   Collection Time: 04/30/19  1:37 PM  Result Value Ref Range   Lactic Acid, Venous 1.9 0.5 - 1.9 mmol/L    Comment: Performed at [facility], [place], [place] [address]   Ct Head Wo Contrast  Result Date: 04/30/2019 CLINICAL DATA:  83 year old female with unexplained altered mental status. Right side headache for 2 days. Difficulty swallowing. 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 EXAM: CT HEAD WITHOUT CONTRAST TECHNIQUE: Contiguous axial images were obtained from the base of the skull through the vertex without intravenous contrast. COMPARISON:  Brain MRI 08/14/2018. Head CT 08/21/2018. FINDINGS: Brain: Left hemisphere encephalomalacia as expected from the February infarct. Superimposed confluent left deep white matter capsules small vessel related hypodensity. Elsewhere Stable gray-white matter differentiation throughout the brain. Stable cerebral volume. No midline shift, ventriculomegaly, mass effect, evidence of mass lesion, intracranial hemorrhage or cortically based infarction. Vascular: Calcified atherosclerosis at the skull base. New No suspicious intracranial vascular hyperdensity. Skull: Osteopenia.  No acute osseous abnormality identified. Sinuses/Orbits: Small fluid level in the left sphenoid sinus today where there was bubbly opacity previously. Elsewhere Visualized paranasal sinuses and mastoids are stable and well pneumatized. Other: No acute orbit or scalp soft tissue findings. IMPRESSION: 1. No acute  intracranial abnormality. 2. Expected evolution of the left hemisphere infarct since February. Underlying chronic small vessel disease. 3. Mild inflammation in the left sphenoid sinus. Electronically Signed   By: Herminio Heads.D.  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 EXAM: CT CHEST, ABDOMEN, AND PELVIS WITH CONTRAST TECHNIQUE: Multidetector CT imaging of the chest, abdomen and pelvis was performed following the standard protocol during bolus administration of intravenous contrast. CONTRAST:  84mL OMNIPAQUE IOHEXOL 300 MG/ML  SOLN COMPARISON:  03/17/2019, 07/18/2018 FINDINGS: CT CHEST FINDINGS Cardiovascular: Mild prominence of the ascending aorta 3.5 cm is noted. No dissection is seen. Atherosclerotic calcifications are noted. Mild cardiomegaly is seen. The pulmonary artery as visualized is within normal limits. Coronary calcifications are noted. Mediastinum/Nodes: Thoracic inlet is within normal limits. No sizable hilar or mediastinal adenopathy is noted. The esophagus is within normal limits. Lungs/Pleura: Lungs demonstrate diffuse emphysematous changes with left lower lobe consolidation and mild effusion. This is stable from the prior exam. No sizable parenchymal nodules are noted. Musculoskeletal: Degenerative changes of the thoracic spine are noted with increased kyphosis. Focal sclerosis is noted in the anterior aspect of the T10 vertebral body which was not seen on a prior exam from 2015. Possibility of metastatic disease deserves consideration. CT ABDOMEN PELVIS FINDINGS Hepatobiliary: The liver again demonstrates multiple enhancing lesions the largest of which lies within the left lobe of the liver. These are increased in number when compared with the prior exam consistent with progressive disease. The largest of these again lies within the left lobe of the  liver. Gallbladder is decompressed. Pancreas: Mild prominence of the pancreatic duct is again noted and stable. Spleen: Normal in size without focal abnormality. Adrenals/Urinary Tract: Adrenal glands are within normal limits. The kidneys are well visualized. Previously seen right-sided hydronephrosis is stable from the recent noncontrast study of 03/17/2019. No obstructing stone is seen. The bladder is predominately decompressed. Stomach/Bowel: No obstructive or inflammatory changes of the bowel are noted. Postsurgical changes are seen. The appendix is not well appreciated. Vascular/Lymphatic: Vascular calcifications are noted. The previously seen hypodense lesion along the left margin of the liver has decreased in size now measuring approximately 11 mm in short axis. Persistent nodal mass is noted along the iliac chains bilaterally. This is best seen on the right on image number 85 of series 2 and on the left on image number 92 of series 2. These changes are stable from the most recent noncontrast study. Central partially calcified mass within the mesentery along the anterior aspect of the aorta is stable in appearance. Reproductive: Postmenopausal uterus is noted with multiple surrounding veins similar to that seen on the prior exam. Other: No free fluid is noted.  No herniation is seen. Musculoskeletal: Stable sclerotic lesions are noted within the lumbar spine and sacrum as well as the left iliac bone. Stable compression deformities of L3 and L5 are noted. IMPRESSION: Increase in number of metastatic lesions within the liver when compared with the prior examination from January of 2020. The lack of IV contrast on the most recent exam precluded adequate evaluation. Persistent severe hydronephrosis on the right likely related to the nodal mass seen in the pelvis. Partially calcified mass lesion consistent with the known history of carcinoid within the central mesentery. Persistent lymphadenopathy is noted within  the pelvis stable from the most recent noncontrast study. Chronic lower lobe consolidation on the left with small effusion. Sclerotic foci within the bony structures consistent with metastatic disease stable within the abdomen and pelvis. A lesion is noted at T10 which has not been recently evaluated but  is likely chronic in nature. Electronically Signed   By: Inez Catalina M.D.   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 EXAM: CT CHEST, ABDOMEN, AND PELVIS WITH CONTRAST TECHNIQUE: Multidetector CT imaging of the chest, abdomen and pelvis was performed following the standard protocol during bolus administration of intravenous contrast. CONTRAST:  2mL OMNIPAQUE IOHEXOL 300 MG/ML  SOLN COMPARISON:  03/17/2019, 07/18/2018 FINDINGS: CT CHEST FINDINGS Cardiovascular: Mild prominence of the ascending aorta 3.5 cm is noted. No dissection is seen. Atherosclerotic calcifications are noted. Mild cardiomegaly is seen. The pulmonary artery as visualized is within normal limits. Coronary calcifications are noted. Mediastinum/Nodes: Thoracic inlet is within normal limits. No sizable hilar or mediastinal adenopathy is noted. The esophagus is within normal limits. Lungs/Pleura: Lungs demonstrate diffuse emphysematous changes with left lower lobe consolidation and mild effusion. This is stable from the prior exam. No sizable parenchymal nodules are noted. Musculoskeletal: Degenerative changes of the thoracic spine are noted with increased kyphosis. Focal sclerosis is noted in the anterior aspect of the T10 vertebral body which was not seen on a prior exam from 2015. Possibility of metastatic disease deserves consideration. CT ABDOMEN PELVIS FINDINGS Hepatobiliary: The liver again demonstrates multiple enhancing lesions the largest of which lies within the left lobe of the  liver. These are increased in number when compared with the prior exam consistent with progressive disease. The largest of these again lies within the left lobe of the liver. Gallbladder is decompressed. Pancreas: Mild prominence of the pancreatic duct is again noted and stable. Spleen: Normal in size without focal abnormality. Adrenals/Urinary Tract: Adrenal glands are within normal limits. The kidneys are well visualized. Previously seen right-sided hydronephrosis is stable from the recent noncontrast study of 03/17/2019. No obstructing stone is seen. The bladder is predominately decompressed. Stomach/Bowel: No obstructive or inflammatory changes of the bowel are noted. Postsurgical changes are seen. The appendix is not well appreciated. Vascular/Lymphatic: Vascular calcifications are noted. The previously seen hypodense lesion along the left margin of the liver has decreased in size now measuring approximately 11 mm in short axis. Persistent nodal mass is noted along the iliac chains bilaterally. This is best seen on the right on image number 85 of series 2 and on the left on image number 92 of series 2. These changes are stable from the most recent noncontrast study. Central partially calcified mass within the mesentery along the anterior aspect of the aorta is stable in appearance. Reproductive: Postmenopausal uterus is noted with multiple surrounding veins similar to that seen on the prior exam. Other: No free fluid is noted.  No herniation is seen. Musculoskeletal: Stable sclerotic lesions are noted within the lumbar spine and sacrum as well as the left iliac bone. Stable compression deformities of L3 and L5 are noted. IMPRESSION: Increase in number of metastatic lesions within the liver when compared with the prior examination from January of 2020. The lack of IV contrast on the most recent exam precluded adequate evaluation. Persistent severe hydronephrosis on the right likely related to the nodal mass seen  in the pelvis. Partially calcified mass lesion consistent with the known history of carcinoid within the central mesentery. Persistent lymphadenopathy is noted within the pelvis stable from the most recent noncontrast study. Chronic lower lobe consolidation on the left with small effusion. Sclerotic foci within the bony structures consistent with metastatic disease stable within  the abdomen and pelvis. A lesion is noted at T10 which has not been recently evaluated but is likely chronic in nature. Electronically Signed   By: Inez Catalina M.D.   On: 04/30/2019 11:38   Dg Chest Portable 1 View  Result Date: 04/30/2019 CLINICAL DATA:  Patient reports right side headache and trouble swallowing x2 days. Patient also reports decreased taste. Hx of COPD, CHF, atrial fibrillation. Former smoker EXAM: PORTABLE CHEST - 1 VIEW COMPARISON:  03/17/2019 FINDINGS: Persistent small left pleural effusion with adjacent consolidation/atelectasis at the left lung base. No overt interstitial edema. No new infiltrate. Heart size upper limits normal for technique. Aortic Atherosclerosis (ICD10-170.0). No pneumothorax. Visualized bones unremarkable. IMPRESSION: Persistent small left pleural effusion with adjacent atelectasis/consolidation. No acute findings. Electronically Signed   By: Lucrezia Europe M.D.   On: 04/30/2019 10:31    Pending Labs Unresulted Labs (From admission, onward)    Start     Ordered   05/07/19 0500  Creatinine, serum  (enoxaparin (LOVENOX)    CrCl >/= 30 ml/min)  Weekly,   STAT    Comments: while on enoxaparin therapy    04/30/19 1645   05/01/19 0500  CBC  Tomorrow morning,   STAT     04/30/19 1645   05/01/19 [id number]  Basic metabolic panel  Tomorrow morning,   STAT     04/30/19 1645   04/30/19 1646  CBC  (enoxaparin (LOVENOX)    CrCl >/= 30 ml/min)  Once,   STAT    Comments: Baseline for enoxaparin therapy IF NOT ALREADY DRAWN.  Notify MD if PLT < 100 K.    04/30/19 1645   04/30/19 1646  Creatinine,  serum  (enoxaparin (LOVENOX)    CrCl >/= 30 ml/min)  Once,   STAT    Comments: Baseline for enoxaparin therapy IF NOT ALREADY DRAWN.    04/30/19 1645   04/30/19 1413  Procalcitonin - Baseline  ONCE - STAT,   STAT     04/30/19 1412          Vitals/Pain Today's Vitals   04/30/19 1300 04/30/19 1330 04/30/19 1400 04/30/19 1548  BP: 117/83 (!) 122/97  118/90  Pulse: 87 98  86  Resp: (!) 26 (!) 29  18  Temp:      TempSrc:      SpO2: 100% 100%  100%  Weight:      Height:      PainSc: 0-No pain  0-No pain 0-No pain    Isolation Precautions No active isolations  Medications Medications  sodium chloride flush (NS) 0.9 % injection 3 mL (has no administration in time range)  sodium chloride flush (NS) 0.9 % injection 3 mL (has no administration in time range)  0.9 %  sodium chloride infusion (has no administration in time range)  acetaminophen (TYLENOL) tablet 650 mg (has no administration in time range)    Or  acetaminophen (TYLENOL) suppository 650 mg (has no administration in time range)  ondansetron (ZOFRAN) tablet 4 mg (has no administration in time range)    Or  ondansetron (ZOFRAN) injection 4 mg (has no administration in time range)  acetaminophen (TYLENOL) tablet 650 mg (has no administration in time range)  albuterol (VENTOLIN HFA) 108 (90 Base) MCG/ACT inhaler 2 puff (has no administration in time range)  Vitamin B-12 CR TBCR 1,000 mcg (has no administration in time range)  Cholecalciferol 1,000 Units (has no administration in time range)  apixaban (ELIQUIS) tablet 2.5 mg (has no administration in time  range)  feeding supplement (BOOST HIGH PROTEIN) liquid 237 mL (has no administration in time range)  lisinopril (ZESTRIL) tablet 5 mg (has no administration in time range)  Telotristat Ethyl(as Etiprate) TABS 1 tablet (has no administration in time range)  Ampicillin-Sulbactam (UNASYN) 3 g in sodium chloride 0.9 % 100 mL IVPB (3 g Intravenous New Bag/Given 04/30/19 1601)   sodium chloride 0.9 % bolus 250 mL (250 mLs Intravenous Bolus 04/30/19 1049)  iohexol (OMNIPAQUE) 300 MG/ML solution 75 mL (75 mLs Intravenous Contrast Given 04/30/19 1106)  ceFEPIme (MAXIPIME) 2 g in sodium chloride 0.9 % 100 mL IVPB (0 g Intravenous Stopped 04/30/19 1335)  azithromycin (ZITHROMAX) 500 mg in sodium chloride 0.9 % 250 mL IVPB (0 mg Intravenous Stopped 04/30/19 1549)    Mobility walks Low fall risk   Focused Assessments medicine    R Recommendations: See Admitting Provider Note  Report given to:   Additional Notes:

## 2019-04-30 NOTE — Progress Notes (Signed)
Advanced care plan.  Purpose of the Encounter: CODE STATUS  Parties in Attendance: Patient herself and daughter  Patient's Decision Capacity: Intact  Subjective/Patient's story: Veronica Wang  is a 83 y.o. female with a known history of metastatic carcinoid tumor, congestive heart failure, COPD, GERD, hypertension, hyperlipidemia, macular degeneration who is presenting to the hospital with complaint of dysphagia and headaches   Objective/Medical story I discussed with the patient regarding overall poor prognosis with her carcinoid.  I also discussed about her wishes full CODE STATUS   Goals of care determination:  Patient states that she wants to be a DNR.   CODE STATUS:  DNR  Time spent discussing advanced care planning: 16 minutes

## 2019-04-30 NOTE — Progress Notes (Signed)
PHARMACY -  BRIEF ANTIBIOTIC NOTE   Pharmacy has received consult(s) for Cefepime from an ED provider.  The patient's profile has been reviewed for ht/wt/allergies/indication/available labs.    One time order(s) placed for cefepime 2g IV x 1 already entered.   Further antibiotics/pharmacy consults should be ordered by admitting physician if indicated.                       Thank you, Simpson,Michael L 04/30/2019  12:49 PM

## 2019-04-30 NOTE — ED Notes (Signed)
Patient transported to CT 

## 2019-04-30 NOTE — ED Triage Notes (Signed)
Patient reports right side headache and trouble swallowing x2 days. Patient also reports decreased taste. Patient with equal sensation and strength noted in triage.

## 2019-04-30 NOTE — Consult Note (Addendum)
Pharmacy Antibiotic Note  Veronica Wang is a 83 y.o. female admitted on 04/30/2019 with aspiration pneumonia.  Pharmacy has been consulted for Unasyn dosing.  Plan: 1. Unasyn 3g Q12H (CrCl 10-30 mL/min)- will start now. Will follow renal function with AM labs.   Height: 4\' 9"  (144.8 cm) Weight: 86 lb (39 kg) IBW/kg (Calculated) : 38.6  Temp (24hrs), Avg:97.6 F (36.4 C), Min:97.6 F (36.4 C), Max:97.6 F (36.4 C)  Recent Labs  Lab 04/30/19 1009 04/30/19 1337  WBC 15.9*  --   CREATININE 0.93  --   LATICACIDVEN  --  1.9    Estimated Creatinine Clearance: 28.9 mL/min (by C-G formula based on SCr of 0.93 mg/dL).    Allergies  Allergen Reactions  . No Known Allergies     Antimicrobials this admission: 10/21 Azithromycin >>  10/21 Unasyn >>  10/21 Cefepime x1    Microbiology results: 10/21 COVID: Negative   Thank you for allowing pharmacy to be a part of this patient's care.  Rowland Lathe 04/30/2019 2:23 PM

## 2019-04-30 NOTE — ED Notes (Signed)
Returned from ct, denies pain or discomfort. Vss. Patient with clear speech, no obvious facial droop noted. bilaeralt equal strength to upper and lower extremities.

## 2019-04-30 NOTE — ED Notes (Addendum)
Pt on 2L [place] prn at home but has needed it more often as of 2 days ago.

## 2019-04-30 NOTE — ED Notes (Signed)
Patient requesting jello, as per Dr. Quentin Cornwall, ok to give

## 2019-05-01 ENCOUNTER — Inpatient Hospital Stay: Payer: Medicare Other

## 2019-05-01 LAB — BASIC METABOLIC PANEL
Anion gap: 11 (ref 5–15)
BUN: 23 mg/dL (ref 8–23)
CO2: 28 mmol/L (ref 22–32)
Calcium: 8.4 mg/dL — ABNORMAL LOW (ref 8.9–10.3)
Chloride: 106 mmol/L (ref 98–111)
Creatinine, Ser: 0.79 mg/dL (ref 0.44–1.00)
GFR calc Af Amer: >60 mL/min (ref >60)
GFR calc non Af Amer: >60 mL/min (ref >60)
Glucose, Bld: 117 mg/dL — ABNORMAL HIGH (ref 70–99)
Potassium: 3.7 mmol/L (ref 3.5–5.1)
Sodium: 145 mmol/L (ref 135–145)

## 2019-05-01 LAB — CBC
HCT: 44.1 % (ref 36.0–46.0)
Hemoglobin: 13.9 g/dL (ref 12.0–15.0)
MCH: 24.7 pg — ABNORMAL LOW (ref 26.0–34.0)
MCHC: 31.5 g/dL (ref 30.0–36.0)
MCV: 78.5 fL — ABNORMAL LOW (ref 80.0–100.0)
Platelets: 358 10*3/uL (ref 150–400)
RBC: 5.62 MIL/uL — ABNORMAL HIGH (ref 3.87–5.11)
RDW: 18.9 % — ABNORMAL HIGH (ref 11.5–15.5)
WBC: 17.8 10*3/uL — ABNORMAL HIGH (ref 4.0–10.5)
nRBC: 0 % (ref 0.0–0.2)

## 2019-05-01 MED ORDER — ENSURE ENLIVE PO LIQD
237.0000 mL | Freq: Three times a day (TID) | ORAL | Status: DC
  Administered 2019-05-01 – 2019-05-02 (×2): 237 mL via ORAL

## 2019-05-01 MED ORDER — ADULT MULTIVITAMIN W/MINERALS CH
1.0000 | ORAL_TABLET | Freq: Every day | ORAL | Status: DC
  Administered 2019-05-03: 1 via ORAL
  Filled 2019-05-01: qty 1

## 2019-05-01 MED ORDER — SODIUM CHLORIDE 0.9 % IV SOLN
3.0000 g | Freq: Four times a day (QID) | INTRAVENOUS | Status: DC
  Administered 2019-05-01 – 2019-05-02 (×6): 3 g via INTRAVENOUS
  Filled 2019-05-01 (×2): qty 8
  Filled 2019-05-01 (×6): qty 3
  Filled 2019-05-01 (×2): qty 8

## 2019-05-01 MED ORDER — TELOTRISTAT ETHYL(AS ETIPRATE) 250 MG PO TABS
250.0000 mg | ORAL_TABLET | Freq: Three times a day (TID) | ORAL | Status: DC
  Administered 2019-05-01 – 2019-05-03 (×5): 250 mg via ORAL
  Filled 2019-05-01 (×6): qty 1

## 2019-05-01 MED ORDER — PANTOPRAZOLE SODIUM 40 MG IV SOLR
40.0000 mg | Freq: Two times a day (BID) | INTRAVENOUS | Status: DC
  Administered 2019-05-01 – 2019-05-03 (×4): 40 mg via INTRAVENOUS
  Filled 2019-05-01 (×4): qty 40

## 2019-05-01 NOTE — Progress Notes (Addendum)
Initial Nutrition Assessment  DOCUMENTATION CODES:   Severe malnutrition in context of chronic illness  INTERVENTION:   Ensure Enlive po BID, each supplement provides 350 kcal and 20 grams of protein  Magic cup TID with meals, each supplement provides 290 kcal and 9 grams of protein  MVI daily   Dysphagia 3 diet   Pt at moderate refeed risk; recommend monitor K, Mg and P labs until stable  NUTRITION DIAGNOSIS:   Severe Malnutrition related to cancer and cancer related treatments, other (see comment)(COPD, CHF) as evidenced by severe muscle depletion, severe fat depletion.  GOAL:   Patient will meet greater than or equal to 90% of their needs   MONITOR:   PO intake, Supplement acceptance, Labs, Weight trends, Skin, I & O's  REASON FOR ASSESSMENT:   Malnutrition Screening Tool    ASSESSMENT:   83 y.o. female with a known history of metastatic carcinoid tumor, gastrectomy, congestive heart failure, COPD, GERD, hypertension, hyperlipidemia, macular degeneration who is presenting to the hospital with complaint of dysphagia and headaches.   Pt is well known to nutrition department and this RD from multiple previous admits. Pt with poor appetite and oral intake at baseline. Pt reports difficulty swallowing and food getting stuck in her throat for several days pta. Pt also reports that she has no desire for food. Pt does enjoy Ensure and has been compliant with drinking this in the past. RD will order supplements and MVI to help pt meet her estimated needs. SLP evaluation pending. Will order dysphagia 3 diet for now. Pt ate 30% of her breakfast this morning. Suspect pt is at moderate refeed risk; recommend monitor electrolytes. Per chart, pt is weight stable pta.   Of note, patient with h/o gastrectomy; would recommend checking copper and B12 labs to r/o deficiency.   Medications reviewed and include: vitamin D3, protonix, B12, unasyn   Labs reviewed: wbc- 17.8(H), MCV 78.5(L),  MCH 24.7(L)  NUTRITION - FOCUSED PHYSICAL EXAM:    Most Recent Value  Orbital Region  Severe depletion  Upper Arm Region  Severe depletion  Thoracic and Lumbar Region  Severe depletion  Buccal Region  Moderate depletion  Temple Region  Severe depletion  Clavicle Bone Region  Severe depletion  Clavicle and Acromion Bone Region  Severe depletion  Scapular Bone Region  Unable to assess  Dorsal Hand  Severe depletion  Patellar Region  Severe depletion  Anterior Thigh Region  Severe depletion  Posterior Calf Region  Severe depletion  Edema (RD Assessment)  None  Hair  Reviewed  Eyes  Reviewed  Mouth  Reviewed  Skin  Reviewed  Nails  Reviewed     Diet Order:   Diet Order            DIET DYS 3 Room service appropriate? Yes; Fluid consistency: Thin  Diet effective now             EDUCATION NEEDS:   No education needs have been identified at this time  Skin:  Skin Assessment: Reviewed RN Assessment(ecchymosis)  Last BM:  10/22- type 6  Height:   Ht Readings from Last 1 Encounters:  04/30/19 4\' 9"  (1.448 m)    Weight:   Wt Readings from Last 1 Encounters:  04/30/19 39 kg    Ideal Body Weight:  43.2 kg  BMI:  Body mass index is 18.61 kg/m.  Estimated Nutritional Needs:   Kcal:  1000-1200kcal/day  Protein:  60-66g/day  Fluid:  1L/day  Koleen Distance MS, RD,  LDN Pager #- [phone] Office#- [phone] After Hours Pager: [phone]

## 2019-05-01 NOTE — Consult Note (Signed)
Pharmacy Antibiotic Note  Veronica Wang is a 83 y.o. female admitted on 04/30/2019 with aspiration pneumonia.  Pharmacy has been consulted for Unasyn dosing.  Plan: 10/22 @ 0600 patient's CrCl is 33.6 ml/min will readjust Unasyn 3g IV q6h, will continue to monitor.  Height: 4\' 9"  (144.8 cm) Weight: 86 lb (39 kg) IBW/kg (Calculated) : 38.6  Temp (24hrs), Avg:97.9 F (36.6 C), Min:97.5 F (36.4 C), Max:98.4 F (36.9 C)  Recent Labs  Lab 04/30/19 1009 04/30/19 1337 05/01/19 0411  WBC 15.9*  --  17.8*  CREATININE 0.93  --  0.79  LATICACIDVEN  --  1.9  --     Estimated Creatinine Clearance: 33.6 mL/min (by C-G formula based on SCr of 0.79 mg/dL).    Allergies  Allergen Reactions  . No Known Allergies     Antimicrobials this admission: 10/21 Azithromycin >>  10/21 Unasyn >>  10/21 Cefepime x1    Microbiology results: 10/21 COVID: Negative   Thank you for allowing pharmacy to be a part of this patient's care.  Tobie Lords, PharmD, BCPS Clinical Pharmacist 05/01/2019 6:01 AM

## 2019-05-01 NOTE — Evaluation (Signed)
Clinical/Bedside Swallow Evaluation Patient Details  Name: Veronica Wang MRN: [id number] Date of Birth: 06-02-1936  Today's Date: 05/01/2019 Time: SLP Start Time (ACUTE ONLY): [id number] SLP Stop Time (ACUTE ONLY): 1355 SLP Time Calculation (min) (ACUTE ONLY): 70 min  Past Medical History:  Past Medical History:  Diagnosis Date  . Abnormal MRI 09/10/2015  . Atrial fibrillation ([place])   . Cancer ([place])    Liver  . Cancer ([place])    stomach  . Carcinoid tumor of intestine   . CHF (congestive heart failure) ([place])   . COPD (chronic obstructive pulmonary disease) ([place])   . FH: chemotherapy   . GERD (gastroesophageal reflux disease)   . Hyperlipidemia   . Hypertension   . Macular degeneration   . Polycythemia   . Radiation    Past Surgical History:  Past Surgical History:  Procedure Laterality Date  . CATARACT EXTRACTION  splenectomy  . COLON RESECTION    . COLON SURGERY    . GASTRECTOMY    . SPLENECTOMY     HPI: Per admitting History and Physical:   Veronica Wang  is a 83 y.o. female with a known history of metastatic carcinoid tumor, congestive heart failure, COPD, GERD, hypertension, hyperlipidemia, macular degeneration who is presenting to the hospital with complaint of dysphagia and headaches.  Patient states that she has been having progressive swelling to trouble over the past few weeks.  In the emergency room she was admitted with CT scan of the chest which showed consolidation in her lungs.  Therefore it prompted ED physician to admit the patient.  Patient does complain of cough and constantly having to cough off stop.  Patient states that food gets stuck when she tries to swallow and is unable to follow the food down her throat.   Assessment / Plan / Recommendation Clinical Impression  Bedside swallow eval revealed overt s/s of aspiration with thin liquids inconsistently and pharyngeal dysphagia. Oral mech exam revealed upper and lower dentures to be loose from weight loss. Bottom  dentures broken. Pt was able to masticate solid foods given extended time to do so. Noted immediate cough after thin liquids inconsistently. When asked to "hold" the liquid bolus before swallowing, Pt tolerated thins without s/s of aspiration. Pt c/o food often feeling "stuck" as well as coming back up very often. She describes what is characteristic of backflow. She also reports feeling thick phlegm or mucous which often makes her feel as if she is choking.  Pt has had UGI series which reports no reflux or obstruction. Given Pts s/s of aspiration and ability to perform compensatory strategies, rec. Modified Barium Swallow Study to further assess pharyngeal phase of swallow with different consistencies while utilizing strategies to improve swallowing. Will downgrade diet to Dysphagia for ease of chewing. Continue with thin liquids utilizing "hold and then swallow" until further assessment. Would be hesitant to limit diet too much given current nutritional status. Dietitian report reviewed and appreciated. ST to follow up and proceed with MBSS as appropriate. Meds should be given in applesauce, crush if needed. SLP Visit Diagnosis: Dysphagia, oropharyngeal phase (R13.12)    Aspiration Risk  Mild aspiration risk;Moderate aspiration risk    Diet Recommendation Dysphagia 2 (Fine chop)   Liquid Administration via: Cup;Straw Medication Administration: Whole meds with puree Supervision: Patient able to self feed Compensations: Slow rate;Small sips/bites(Hold before swallowing with liquids) Postural Changes: Seated upright at 90 degrees;Remain upright for at least 30 minutes after po intake    Other  Recommendations  Per pending MBSS  Follow up Recommendations   Per pending MBSS     Frequency and Duration min 3x week  1 week       Prognosis Prognosis for Safe Diet Advancement: Fair      Swallow Study   General Type of Study: Bedside Swallow Evaluation Diet Prior to this Study: Dysphagia 3  (soft) Respiratory Status: Room air Behavior/Cognition: Alert;Cooperative;Pleasant mood Oral Cavity Assessment: Within Functional Limits Oral Cavity - Dentition: Dentures, top;Dentures, bottom;Missing dentition Vision: Functional for self-feeding Self-Feeding Abilities: Able to feed self Patient Positioning: Upright in bed Baseline Vocal Quality: Breathy;Hoarse Volitional Cough: Weak Volitional Swallow: Able to elicit    Oral/Motor/Sensory Function Overall Oral Motor/Sensory Function: Within functional limits   Ice Chips Ice chips: Not tested   Thin Liquid Thin Liquid: Impaired Presentation: Cup;Self Fed;Spoon;Straw Pharyngeal  Phase Impairments: Throat Clearing - Immediate;Cough - Delayed    Nectar Thick Nectar Thick Liquid: Not tested   Honey Thick     Puree Puree: Within functional limits   Solid     Solid: Impaired Presentation: Self Fed Oral Phase Functional Implications: Impaired mastication;Prolonged oral transit Pharyngeal Phase Impairments: Multiple swallows      [name] 05/01/2019,1:55 PM

## 2019-05-01 NOTE — Progress Notes (Signed)
[place] at [facility]                                                                                                                                                                                  Patient Demographics   Veronica Wang, is a 83 y.o. female, DOB - [birth year], PH:[id number]  Admit date - 04/30/2019   Admitting Physician Dustin Flock, MD  Outpatient Primary MD for the patient is Tama High III, MD   LOS - 1  Subjective: Patient states that she is feeling little better still has some congestion    Review of Systems:   CONSTITUTIONAL: No documented fever. No fatigue, weakness. No weight gain, no weight loss.  EYES: No blurry or double vision.  ENT: No tinnitus. No postnasal drip. No redness of the oropharynx.  RESPIRATORY: Positive cough, no wheeze, no hemoptysis. No dyspnea.  CARDIOVASCULAR: No chest pain. No orthopnea. No palpitations. No syncope.  GASTROINTESTINAL: No nausea, no vomiting or diarrhea. No abdominal pain. No melena or hematochezia.  GENITOURINARY: No dysuria or hematuria.  ENDOCRINE: No polyuria or nocturia. No heat or cold intolerance.  HEMATOLOGY: No anemia. No bruising. No bleeding.  INTEGUMENTARY: No rashes. No lesions.  MUSCULOSKELETAL: No arthritis. No swelling. No gout.  NEUROLOGIC: No numbness, tingling, or ataxia. No seizure-type activity.  PSYCHIATRIC: No anxiety. No insomnia. No ADD.    Vitals:   Vitals:   04/30/19 1900 04/30/19 2012 05/01/19 0502 05/01/19 1058  BP: 110/80 99/76 100/85 102/75  Pulse: 98 86 98 80  Resp: 20 18 16 18   Temp: (!) 97.5 F (36.4 C) 98.4 F (36.9 C) 97.9 F (36.6 C) 97.6 F (36.4 C)  TempSrc: Oral Oral Oral Oral  SpO2: 100% 99% 100% 100%  Weight:      Height:        Wt Readings from Last 3 Encounters:  04/30/19 39 kg  04/15/19 32 kg  04/09/19 31.8 kg     Intake/Output Summary (Last 24 hours) at 05/01/2019 1147 Last data filed at 05/01/2019 0900 Gross per 24  hour  Intake 765.17 ml  Output -  Net 765.17 ml    Physical Exam:   GENERAL: Pleasant-appearing in no apparent distress.  HEAD, EYES, EARS, NOSE AND THROAT: Atraumatic, normocephalic. Extraocular muscles are intact. Pupils equal and reactive to light. Sclerae anicteric. No conjunctival injection. No oro-pharyngeal erythema.  NECK: Supple. There is no jugular venous distention. No bruits, no lymphadenopathy, no thyromegaly.  HEART: Regular rate and rhythm,. No murmurs, no rubs, no clicks.  LUNGS: Rhonchus breath sounds bilaterally ABDOMEN: Soft, flat, nontender, nondistended. Has good bowel sounds. No hepatosplenomegaly appreciated.  EXTREMITIES: No evidence of  any cyanosis, clubbing, or peripheral edema.  +2 pedal and radial pulses bilaterally.  NEUROLOGIC: The patient is alert, awake, and oriented x3 with no focal motor or sensory deficits appreciated bilaterally.  SKIN: Moist and warm with no rashes appreciated.  Psych: Not anxious, depressed LN: No inguinal LN enlargement    Antibiotics   Anti-infectives (From admission, onward)   Start     Dose/Rate Route Frequency Ordered Stop   05/01/19 1000  Ampicillin-Sulbactam (UNASYN) 3 g in sodium chloride 0.9 % 100 mL IVPB     3 g 200 mL/hr over 30 Minutes Intravenous Every 6 hours 05/01/19 0601     04/30/19 1500  Ampicillin-Sulbactam (UNASYN) 3 g in sodium chloride 0.9 % 100 mL IVPB  Status:  Discontinued     3 g 200 mL/hr over 30 Minutes Intravenous Every 12 hours 04/30/19 1422 05/01/19 0601   04/30/19 1245  ceFEPIme (MAXIPIME) 2 g in sodium chloride 0.9 % 100 mL IVPB     2 g 200 mL/hr over 30 Minutes Intravenous  Once 04/30/19 1240 04/30/19 1335   04/30/19 1245  azithromycin (ZITHROMAX) 500 mg in sodium chloride 0.9 % 250 mL IVPB     500 mg 250 mL/hr over 60 Minutes Intravenous  Once 04/30/19 1240 04/30/19 1549      Medications   Scheduled Meds: . apixaban  2.5 mg Oral BID  . cholecalciferol  1,000 Units Oral Daily  .  feeding supplement (ENSURE ENLIVE)  237 mL Oral TID BM  . lisinopril  5 mg Oral Daily  . [START ON 05/02/2019] multivitamin with minerals  1 tablet Oral Daily  . pantoprazole (PROTONIX) IV  40 mg Intravenous Q12H  . sodium chloride flush  3 mL Intravenous Q12H  . Telotristat Ethyl(as Etiprate)  1 tablet Oral TID  . vitamin B-12  1,000 mcg Oral Daily   Continuous Infusions: . sodium chloride 250 mL (05/01/19 0401)  . ampicillin-sulbactam (UNASYN) IV 3 g (05/01/19 1114)   PRN Meds:.sodium chloride, acetaminophen **OR** acetaminophen, acetaminophen, albuterol, ondansetron **OR** ondansetron (ZOFRAN) IV, sodium chloride flush   Data Review:   Micro Results Recent Results (from the past 240 hour(s))  SARS Coronavirus 2 by RT PCR (hospital order, performed in [place] hospital lab) Nasopharyngeal Nasopharyngeal Swab     Status: None   Collection Time: 04/30/19 10:47 AM   Specimen: Nasopharyngeal Swab  Result Value Ref Range Status   SARS Coronavirus 2 NEGATIVE NEGATIVE Final    Comment: (NOTE) If result is NEGATIVE SARS-CoV-2 target nucleic acids are NOT DETECTED. The SARS-CoV-2 RNA is generally detectable in upper and lower  respiratory specimens during the acute phase of infection. The lowest  concentration of SARS-CoV-2 viral copies this assay can detect is 250  copies / mL. A negative result does not preclude SARS-CoV-2 infection  and should not be used as the sole basis for treatment or other  patient management decisions.  A negative result may occur with  improper specimen collection / handling, submission of specimen other  than nasopharyngeal swab, presence of viral mutation(s) within the  areas targeted by this assay, and inadequate number of viral copies  (<250 copies / mL). A negative result must be combined with clinical  observations, patient history, and epidemiological information. If result is POSITIVE SARS-CoV-2 target nucleic acids are DETECTED. The SARS-CoV-2  RNA is generally detectable in upper and lower  respiratory specimens dur ing the acute phase of infection.  Positive  results are indicative of active infection with SARS-CoV-2.  Clinical  correlation with patient history and other diagnostic information is  necessary to determine patient infection status.  Positive results do  not rule out bacterial infection or co-infection with other viruses. If result is PRESUMPTIVE POSTIVE SARS-CoV-2 nucleic acids MAY BE PRESENT.   A presumptive positive result was obtained on the submitted specimen  and confirmed on repeat testing.  While 2019 novel coronavirus  (SARS-CoV-2) nucleic acids may be present in the submitted sample  additional confirmatory testing may be necessary for epidemiological  and / or clinical management purposes  to differentiate between  SARS-CoV-2 and other Sarbecovirus currently known to infect humans.  If clinically indicated additional testing with an alternate test  methodology [phone]) is advised. The SARS-CoV-2 RNA is generally  detectable in upper and lower respiratory sp ecimens during the acute  phase of infection. The expected result is Negative. Fact Sheet for Patients:  StrictlyIdeas.no Fact Sheet for Healthcare Providers: BankingDealers.co.za This test is not yet approved or cleared by the [place] FDA and has been authorized for detection and/or diagnosis of SARS-CoV-2 by FDA under an Emergency Use Authorization (EUA).  This EUA will remain in effect (meaning this test can be used) for the duration of the COVID-19 declaration under Section 564(b)(1) of the Act, 21 U.S.C. section 360bbb-3(b)(1), unless the authorization is terminated or revoked sooner. Performed at [facility], [address] [place], [place] [address]     Radiology Reports Ct Head Wo Contrast  Result Date: 04/30/2019 CLINICAL DATA:  83 year old female with unexplained  altered mental status. Right side headache for 2 days. Difficulty swallowing. 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 EXAM: CT HEAD WITHOUT CONTRAST TECHNIQUE: Contiguous axial images were obtained from the base of the skull through the vertex without intravenous contrast. COMPARISON:  Brain MRI 08/14/2018. Head CT 08/21/2018. FINDINGS: Brain: Left hemisphere encephalomalacia as expected from the February infarct. Superimposed confluent left deep white matter capsules small vessel related hypodensity. Elsewhere Stable gray-white matter differentiation throughout the brain. Stable cerebral volume. No midline shift, ventriculomegaly, mass effect, evidence of mass lesion, intracranial hemorrhage or cortically based infarction. Vascular: Calcified atherosclerosis at the skull base. New No suspicious intracranial vascular hyperdensity. Skull: Osteopenia.  No acute osseous abnormality identified. Sinuses/Orbits: Small fluid level in the left sphenoid sinus today where there was bubbly opacity previously. Elsewhere Visualized paranasal sinuses and mastoids are stable and well pneumatized. Other: No acute orbit or scalp soft tissue findings. IMPRESSION: 1. No acute intracranial abnormality. 2. Expected evolution of the left hemisphere infarct since February. Underlying chronic small vessel disease. 3. Mild inflammation in the left sphenoid sinus. Electronically Signed   By: Genevie Ann M.D.   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 EXAM: CT CHEST, ABDOMEN, AND PELVIS WITH CONTRAST TECHNIQUE: Multidetector CT imaging of the chest, abdomen and pelvis was performed following the standard protocol during bolus administration of intravenous contrast. CONTRAST:  78mL OMNIPAQUE IOHEXOL 300 MG/ML  SOLN COMPARISON:  03/17/2019, 07/18/2018 FINDINGS: CT CHEST FINDINGS Cardiovascular: Mild  prominence of the ascending aorta 3.5 cm is noted. No dissection is seen. Atherosclerotic calcifications are noted. Mild cardiomegaly is seen. The pulmonary artery as visualized is within normal limits. Coronary calcifications are noted. Mediastinum/Nodes: Thoracic inlet is within normal limits. No sizable hilar or mediastinal adenopathy is noted. The esophagus is within normal limits. Lungs/Pleura: Lungs demonstrate diffuse emphysematous changes with left  lower lobe consolidation and mild effusion. This is stable from the prior exam. No sizable parenchymal nodules are noted. Musculoskeletal: Degenerative changes of the thoracic spine are noted with increased kyphosis. Focal sclerosis is noted in the anterior aspect of the T10 vertebral body which was not seen on a prior exam from 2015. Possibility of metastatic disease deserves consideration. CT ABDOMEN PELVIS FINDINGS Hepatobiliary: The liver again demonstrates multiple enhancing lesions the largest of which lies within the left lobe of the liver. These are increased in number when compared with the prior exam consistent with progressive disease. The largest of these again lies within the left lobe of the liver. Gallbladder is decompressed. Pancreas: Mild prominence of the pancreatic duct is again noted and stable. Spleen: Normal in size without focal abnormality. Adrenals/Urinary Tract: Adrenal glands are within normal limits. The kidneys are well visualized. Previously seen right-sided hydronephrosis is stable from the recent noncontrast study of 03/17/2019. No obstructing stone is seen. The bladder is predominately decompressed. Stomach/Bowel: No obstructive or inflammatory changes of the bowel are noted. Postsurgical changes are seen. The appendix is not well appreciated. Vascular/Lymphatic: Vascular calcifications are noted. The previously seen hypodense lesion along the left margin of the liver has decreased in size now measuring approximately 11 mm in short  axis. Persistent nodal mass is noted along the iliac chains bilaterally. This is best seen on the right on image number 85 of series 2 and on the left on image number 92 of series 2. These changes are stable from the most recent noncontrast study. Central partially calcified mass within the mesentery along the anterior aspect of the aorta is stable in appearance. Reproductive: Postmenopausal uterus is noted with multiple surrounding veins similar to that seen on the prior exam. Other: No free fluid is noted.  No herniation is seen. Musculoskeletal: Stable sclerotic lesions are noted within the lumbar spine and sacrum as well as the left iliac bone. Stable compression deformities of L3 and L5 are noted. IMPRESSION: Increase in number of metastatic lesions within the liver when compared with the prior examination from January of 2020. The lack of IV contrast on the most recent exam precluded adequate evaluation. Persistent severe hydronephrosis on the right likely related to the nodal mass seen in the pelvis. Partially calcified mass lesion consistent with the known history of carcinoid within the central mesentery. Persistent lymphadenopathy is noted within the pelvis stable from the most recent noncontrast study. Chronic lower lobe consolidation on the left with small effusion. Sclerotic foci within the bony structures consistent with metastatic disease stable within the abdomen and pelvis. A lesion is noted at T10 which has not been recently evaluated but is likely chronic in nature. Electronically Signed   By: Inez Catalina M.D.   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 EXAM: CT CHEST, ABDOMEN, AND PELVIS WITH CONTRAST TECHNIQUE: Multidetector CT imaging of the chest, abdomen and pelvis was performed following the standard protocol during bolus  administration of intravenous contrast. CONTRAST:  39mL OMNIPAQUE IOHEXOL 300 MG/ML  SOLN COMPARISON:  03/17/2019, 07/18/2018 FINDINGS: CT CHEST FINDINGS Cardiovascular: Mild prominence of the ascending aorta 3.5 cm is noted. No dissection is seen. Atherosclerotic calcifications are noted. Mild cardiomegaly is seen. The pulmonary artery as visualized is within normal limits. Coronary calcifications are noted. Mediastinum/Nodes: Thoracic inlet is within normal limits. No sizable hilar or mediastinal  adenopathy is noted. The esophagus is within normal limits. Lungs/Pleura: Lungs demonstrate diffuse emphysematous changes with left lower lobe consolidation and mild effusion. This is stable from the prior exam. No sizable parenchymal nodules are noted. Musculoskeletal: Degenerative changes of the thoracic spine are noted with increased kyphosis. Focal sclerosis is noted in the anterior aspect of the T10 vertebral body which was not seen on a prior exam from 2015. Possibility of metastatic disease deserves consideration. CT ABDOMEN PELVIS FINDINGS Hepatobiliary: The liver again demonstrates multiple enhancing lesions the largest of which lies within the left lobe of the liver. These are increased in number when compared with the prior exam consistent with progressive disease. The largest of these again lies within the left lobe of the liver. Gallbladder is decompressed. Pancreas: Mild prominence of the pancreatic duct is again noted and stable. Spleen: Normal in size without focal abnormality. Adrenals/Urinary Tract: Adrenal glands are within normal limits. The kidneys are well visualized. Previously seen right-sided hydronephrosis is stable from the recent noncontrast study of 03/17/2019. No obstructing stone is seen. The bladder is predominately decompressed. Stomach/Bowel: No obstructive or inflammatory changes of the bowel are noted. Postsurgical changes are seen. The appendix is not well appreciated.  Vascular/Lymphatic: Vascular calcifications are noted. The previously seen hypodense lesion along the left margin of the liver has decreased in size now measuring approximately 11 mm in short axis. Persistent nodal mass is noted along the iliac chains bilaterally. This is best seen on the right on image number 85 of series 2 and on the left on image number 92 of series 2. These changes are stable from the most recent noncontrast study. Central partially calcified mass within the mesentery along the anterior aspect of the aorta is stable in appearance. Reproductive: Postmenopausal uterus is noted with multiple surrounding veins similar to that seen on the prior exam. Other: No free fluid is noted.  No herniation is seen. Musculoskeletal: Stable sclerotic lesions are noted within the lumbar spine and sacrum as well as the left iliac bone. Stable compression deformities of L3 and L5 are noted. IMPRESSION: Increase in number of metastatic lesions within the liver when compared with the prior examination from January of 2020. The lack of IV contrast on the most recent exam precluded adequate evaluation. Persistent severe hydronephrosis on the right likely related to the nodal mass seen in the pelvis. Partially calcified mass lesion consistent with the known history of carcinoid within the central mesentery. Persistent lymphadenopathy is noted within the pelvis stable from the most recent noncontrast study. Chronic lower lobe consolidation on the left with small effusion. Sclerotic foci within the bony structures consistent with metastatic disease stable within the abdomen and pelvis. A lesion is noted at T10 which has not been recently evaluated but is likely chronic in nature. Electronically Signed   By: Inez Catalina M.D.   On: 04/30/2019 11:38   Dg Chest Portable 1 View  Result Date: 04/30/2019 CLINICAL DATA:  Patient reports right side headache and trouble swallowing x2 days. Patient also reports decreased taste.  Hx of COPD, CHF, atrial fibrillation. Former smoker EXAM: PORTABLE CHEST - 1 VIEW COMPARISON:  03/17/2019 FINDINGS: Persistent small left pleural effusion with adjacent consolidation/atelectasis at the left lung base. No overt interstitial edema. No new infiltrate. Heart size upper limits normal for technique. Aortic Atherosclerosis (ICD10-170.0). No pneumothorax. Visualized bones unremarkable. IMPRESSION: Persistent small left pleural effusion with adjacent atelectasis/consolidation. No acute findings. Electronically Signed   By: Lucrezia Europe M.D.   On:  04/30/2019 10:31   Dg Duanne Limerick W Single Cm (sol Or Thin Ba)  Result Date: 05/01/2019 CLINICAL DATA:  Metastatic carcinoid tumor.  Dysphagia. EXAM: UPPER GI SERIES WITHOUT KUB TECHNIQUE: Routine upper GI series was performed with thin/high density/water soluble barium. FLUOROSCOPY TIME:  Fluoroscopy Time:  2 minutes 30 seconds Radiation Exposure Index (if provided by the fluoroscopic device): 29.9 mGy COMPARISON:  CT 04/30/2019, 07/18/2018. FINDINGS: Limited exam due to patient's condition. Esophagus is widely patent. No reflux. Stomach is widely patent without focal abnormality. Duodenum and C-loop appear normal. IV contrast from prior CT of 04/30/2019 noted in a hydronephrotic right kidney. Stable mild L3 compression fracture. Surgical clips in abdomen. IMPRESSION: 1. Normal upper GI. No obstructing abnormality identified. No reflux. 2. IV contrast from prior CT of 04/30/2019 noted in a hydronephrotic right kidney. Electronically Signed   By: Marcello Moores  Register   On: 05/01/2019 08:07   Vas [name] Lower Extremity Venous Reflux  Result Date: 04/15/2019  Lower Venous Reflux Study Indications: Pain.  Performing Technologist: Charlane Ferretti RT (R)(VS)  Examination Guidelines: A complete evaluation includes B-mode imaging, spectral Doppler, color Doppler, and power Doppler as needed of all accessible portions of each vessel. Bilateral testing is considered an integral part  of a complete examination. Limited examinations for reoccurring indications may be performed as noted. The reflux portion of the exam is performed with the patient in reverse Trendelenburg.  +---------+---------------+---------+-----------+----------+--------------+ RIGHT    CompressibilityPhasicitySpontaneityPropertiesThrombus Aging +---------+---------------+---------+-----------+----------+--------------+ CFV      Full                                                        +---------+---------------+---------+-----------+----------+--------------+ SFJ      Full                                                        +---------+---------------+---------+-----------+----------+--------------+ FV Prox  Full                                                        +---------+---------------+---------+-----------+----------+--------------+ FV Mid   Full                                                        +---------+---------------+---------+-----------+----------+--------------+ FV DistalFull                                                        +---------+---------------+---------+-----------+----------+--------------+ POP      Full                                                        +---------+---------------+---------+-----------+----------+--------------+  GSV      Full                                                        +---------+---------------+---------+-----------+----------+--------------+ SSV      Partial                                                     +---------+---------------+---------+-----------+----------+--------------+  +---------+---------------+---------+-----------+----------+--------------+ LEFT     CompressibilityPhasicitySpontaneityPropertiesThrombus Aging +---------+---------------+---------+-----------+----------+--------------+ CFV      Full                                                         +---------+---------------+---------+-----------+----------+--------------+ SFJ      Full                                                        +---------+---------------+---------+-----------+----------+--------------+ FV Prox  Full                                                        +---------+---------------+---------+-----------+----------+--------------+ FV Mid   Full                                                        +---------+---------------+---------+-----------+----------+--------------+ FV DistalFull                                                        +---------+---------------+---------+-----------+----------+--------------+ POP      Full                                                        +---------+---------------+---------+-----------+----------+--------------+ GSV      Full                                                        +---------+---------------+---------+-----------+----------+--------------+ SSV      None                                                        +---------+---------------+---------+-----------+----------+--------------+  Venous Reflux Times Normal value < 0.5 sec +---+---------+    Left (ms) +---+---------+ [id number]   +---+---------+ Summary: Right: There is no evidence of deep vein thrombosis in the lower extremity.There is no evidence of superficial venous thrombosis. Left: There is no evidence of deep vein thrombosis in the lower extremity.There is no evidence of superficial venous thrombosis.  *See table(s) above for measurements and observations. Electronically signed by Leotis Pain MD on 04/15/2019 at 4:58:18 PM.    Final      CBC Recent Labs  Lab 04/30/19 1009 05/01/19 0411  WBC 15.9* 17.8*  HGB 14.2 13.9  HCT 44.1 44.1  PLT 352 358  MCV 78.8* 78.5*  MCH 25.4* 24.7*  MCHC 32.2 31.5  RDW 19.1* 18.9*  LYMPHSABS 1.5  --   MONOABS 0.7  --   EOSABS 0.0  --   BASOSABS 0.1  --      Chemistries  Recent Labs  Lab 04/30/19 1009 05/01/19 0411  NA 144 145  K 3.5 3.7  CL 103 106  CO2 28 28  GLUCOSE 110* 117*  BUN 22 23  CREATININE 0.93 0.79  CALCIUM 8.5* 8.4*  AST 30  --   ALT 16  --   ALKPHOS 121  --   BILITOT 0.7  --    ------------------------------------------------------------------------------------------------------------------ estimated creatinine clearance is 33.6 mL/min (by C-G formula based on SCr of 0.79 mg/dL). ------------------------------------------------------------------------------------------------------------------ No results for input(s): HGBA1C in the last 72 hours. ------------------------------------------------------------------------------------------------------------------ No results for input(s): CHOL, HDL, LDLCALC, TRIG, CHOLHDL, LDLDIRECT in the last 72 hours. ------------------------------------------------------------------------------------------------------------------ No results for input(s): TSH, T4TOTAL, T3FREE, THYROIDAB in the last 72 hours.  Invalid input(s): FREET3 ------------------------------------------------------------------------------------------------------------------ No results for input(s): VITAMINB12, FOLATE, FERRITIN, TIBC, IRON, RETICCTPCT in the last 72 hours.  Coagulation profile No results for input(s): INR, PROTIME in the last 168 hours.  No results for input(s): DDIMER in the last 72 hours.  Cardiac Enzymes No results for input(s): CKMB, TROPONINI, MYOGLOBIN in the last 168 hours.  Invalid input(s): CK ------------------------------------------------------------------------------------------------------------------ Invalid input(s): [place]   Patient is 83 year old presenting with dysphagia noted to have a possible pneumonia  1.  Aspiration pneumonia continue IV Unasyn  Speech eval pending  2.  Dysphagia upper GI series without evidence of any  obstruction Continue patient on PPI  3.  Carcinoid tumor prognosis very poor discussed with the patient daughter recommended hospice services, they are not wanting hospice services for now  4.  COPD without exasperation we will use nebs as needed  5.  Essential hypertension continue metoprolol and lisinopril  6.  Chronic CHF currently compensated continue Lasix and lisinopril      Code Status Orders  (From admission, onward)         Start     Ordered   04/30/19 1646  Do not attempt resuscitation (DNR)  Continuous    Question Answer Comment  In the event of cardiac or respiratory ARREST Do not call a "code blue"   In the event of cardiac or respiratory ARREST Do not perform Intubation, CPR, defibrillation or ACLS   In the event of cardiac or respiratory ARREST Use medication by any route, position, wound care, and other measures to relive pain and suffering. May use oxygen, suction and manual treatment of airway obstruction as needed for comfort.      04/30/19 1645        Code Status History    Date Active Date Inactive Code Status Order ID  Comments User Context   10/27/2018 1026 10/30/2018 1711 DNR [id number]  Bettey Costa, MD Inpatient   10/26/2018 2000 10/27/2018 1026 Full Code [id number]  Saundra Shelling, MD Inpatient   08/14/2018 0300 08/15/2018 1605 Full Code [id number]  Lance Coon, MD Inpatient   12/15/2014 1933 01/06/2015 1806 Full Code [id number]  Corey Harold Inpatient   12/05/2014 0340 12/15/2014 1933 Full Code [id number]  Juluis Mire, MD Inpatient   Advance Care Planning Activity           Consults none  DVT Prophylaxis  Lovenox  Lab Results  Component Value Date   PLT 358 05/01/2019     Time Spent in minutes   35-minute greater than 50% of time spent in care coordination and counseling patient regarding the condition and plan of care.   Dustin Flock M.D on 05/01/2019 at 11:47 AM  Between 7am to 6pm - Pager - [phone]  After 6pm go to  [email or web address] - Proofreader  [employer]   Office  [phone]

## 2019-05-02 ENCOUNTER — Telehealth: Payer: Self-pay | Admitting: Urology

## 2019-05-02 ENCOUNTER — Inpatient Hospital Stay: Payer: Medicare Other

## 2019-05-02 DIAGNOSIS — M40209 Unspecified kyphosis, site unspecified: Secondary | ICD-10-CM

## 2019-05-02 DIAGNOSIS — J439 Emphysema, unspecified: Secondary | ICD-10-CM

## 2019-05-02 DIAGNOSIS — R042 Hemoptysis: Secondary | ICD-10-CM

## 2019-05-02 DIAGNOSIS — I509 Heart failure, unspecified: Secondary | ICD-10-CM

## 2019-05-02 DIAGNOSIS — Z8673 Personal history of transient ischemic attack (TIA), and cerebral infarction without residual deficits: Secondary | ICD-10-CM

## 2019-05-02 DIAGNOSIS — D72829 Elevated white blood cell count, unspecified: Secondary | ICD-10-CM

## 2019-05-02 DIAGNOSIS — R1310 Dysphagia, unspecified: Secondary | ICD-10-CM

## 2019-05-02 DIAGNOSIS — F17211 Nicotine dependence, cigarettes, in remission: Secondary | ICD-10-CM

## 2019-05-02 DIAGNOSIS — R0600 Dyspnea, unspecified: Secondary | ICD-10-CM

## 2019-05-02 DIAGNOSIS — N1330 Unspecified hydronephrosis: Secondary | ICD-10-CM

## 2019-05-02 DIAGNOSIS — R531 Weakness: Secondary | ICD-10-CM

## 2019-05-02 DIAGNOSIS — C7B8 Other secondary neuroendocrine tumors: Secondary | ICD-10-CM

## 2019-05-02 DIAGNOSIS — Z9981 Dependence on supplemental oxygen: Secondary | ICD-10-CM

## 2019-05-02 DIAGNOSIS — C7A8 Other malignant neuroendocrine tumors: Secondary | ICD-10-CM

## 2019-05-02 LAB — CBC
HCT: 44.9 % (ref 36.0–46.0)
Hemoglobin: 13.9 g/dL (ref 12.0–15.0)
MCH: 25.1 pg — ABNORMAL LOW (ref 26.0–34.0)
MCHC: 31 g/dL (ref 30.0–36.0)
MCV: 81 fL (ref 80.0–100.0)
Platelets: 321 10*3/uL (ref 150–400)
RBC: 5.54 MIL/uL — ABNORMAL HIGH (ref 3.87–5.11)
RDW: 19.2 % — ABNORMAL HIGH (ref 11.5–15.5)
WBC: 16.5 10*3/uL — ABNORMAL HIGH (ref 4.0–10.5)
nRBC: 0 % (ref 0.0–0.2)

## 2019-05-02 LAB — BASIC METABOLIC PANEL
Anion gap: 15 (ref 5–15)
BUN: 24 mg/dL — ABNORMAL HIGH (ref 8–23)
CO2: 26 mmol/L (ref 22–32)
Calcium: 8.7 mg/dL — ABNORMAL LOW (ref 8.9–10.3)
Chloride: 106 mmol/L (ref 98–111)
Creatinine, Ser: 0.81 mg/dL (ref 0.44–1.00)
GFR calc Af Amer: >60 mL/min (ref >60)
GFR calc non Af Amer: >60 mL/min (ref >60)
Glucose, Bld: 148 mg/dL — ABNORMAL HIGH (ref 70–99)
Potassium: 3.5 mmol/L (ref 3.5–5.1)
Sodium: 147 mmol/L — ABNORMAL HIGH (ref 135–145)

## 2019-05-02 NOTE — Telephone Encounter (Signed)
Per Providers last note pt to be scheduled for surgery, will inquire with surgery coordinator

## 2019-05-02 NOTE — Consult Note (Signed)
Pharmacy Antibiotic Note  Veronica Wang is a 83 y.o. female admitted on 04/30/2019 with aspiration pneumonia.  Pharmacy has been consulted for Unasyn dosing.  Plan: Renal function is stable. Will continue with Unasyn 3g IV q6h, will continue to monitor.  Height: 4\' 9"  (144.8 cm) Weight: 86 lb (39 kg) IBW/kg (Calculated) : 38.6  Temp (24hrs), Avg:98 F (36.7 C), Min:97.5 F (36.4 C), Max:98.2 F (36.8 C)  Recent Labs  Lab 04/30/19 1009 04/30/19 1337 05/01/19 0411 05/02/19 1351  WBC 15.9*  --  17.8* 16.5*  CREATININE 0.93  --  0.79 0.81  LATICACIDVEN  --  1.9  --   --     Estimated Creatinine Clearance: 33.2 mL/min (by C-G formula based on SCr of 0.81 mg/dL).    Allergies  Allergen Reactions  . No Known Allergies     Antimicrobials this admission: 10/21 Azithromycin >>  10/21 Unasyn  x1 10/21 Cefepime x1    Microbiology results: 10/21 COVID: Negative   Thank you for allowing pharmacy to be a part of this patient's care.  Pearla Dubonnet, PharmD Clinical Pharmacist 05/02/2019 2:40 PM

## 2019-05-02 NOTE — Telephone Encounter (Signed)
Pt's daughter called and would like a call back to discuss stent placement for pt. The pt is currently admitted to [facility].

## 2019-05-02 NOTE — Progress Notes (Signed)
[place] [facility]                                                                                                                                                                                  Patient Demographics   Veronica Wang, is a 83 y.o. female, DOB - 1935-12-29, PH:[id number]  Admit date - 04/30/2019   Admitting Physician Dustin Flock, MD  Outpatient Primary MD for the patient is Tama High III, MD   LOS - 2  Subjective: Patient still having trouble with congestion white blood cell count still high    Review of Systems:   CONSTITUTIONAL: No documented fever. No fatigue, weakness. No weight gain, no weight loss.  EYES: No blurry or double vision.  ENT: No tinnitus. No postnasal drip. No redness of the oropharynx.  RESPIRATORY: Positive cough, no wheeze, no hemoptysis. No dyspnea.  CARDIOVASCULAR: No chest pain. No orthopnea. No palpitations. No syncope.  GASTROINTESTINAL: No nausea, no vomiting or diarrhea. No abdominal pain. No melena or hematochezia.  GENITOURINARY: No dysuria or hematuria.  ENDOCRINE: No polyuria or nocturia. No heat or cold intolerance.  HEMATOLOGY: No anemia. No bruising. No bleeding.  INTEGUMENTARY: No rashes. No lesions.  MUSCULOSKELETAL: No arthritis. No swelling. No gout.  NEUROLOGIC: No numbness, tingling, or ataxia. No seizure-type activity.  PSYCHIATRIC: No anxiety. No insomnia. No ADD.    Vitals:   Vitals:   05/02/19 0405 05/02/19 0425 05/02/19 0512 05/02/19 0516  BP: 102/79   94/81  Pulse: (!) 127 (!) 111  68  Resp: 16   18  Temp: (!) 97.5 F (36.4 C)  98.2 F (36.8 C)   TempSrc: Oral  Oral   SpO2: 100% 94%  100%  Weight:      Height:        Wt Readings from Last 3 Encounters:  04/30/19 39 kg  04/15/19 32 kg  04/09/19 31.8 kg     Intake/Output Summary (Last 24 hours) at 05/02/2019 1125 Last data filed at 05/02/2019 0540 Gross per 24 hour  Intake 896.62 ml  Output -  Net 896.62 ml     Physical Exam:   GENERAL: Pleasant-appearing in no apparent distress.  HEAD, EYES, EARS, NOSE AND THROAT: Atraumatic, normocephalic. Extraocular muscles are intact. Pupils equal and reactive to light. Sclerae anicteric. No conjunctival injection. No oro-pharyngeal erythema.  NECK: Supple. There is no jugular venous distention. No bruits, no lymphadenopathy, no thyromegaly.  HEART: Regular rate and rhythm,. No murmurs, no rubs, no clicks.  LUNGS: Rhonchus breath sounds bilaterally ABDOMEN: Soft, flat, nontender, nondistended. Has good bowel sounds. No hepatosplenomegaly appreciated.  EXTREMITIES: No evidence of any cyanosis, clubbing, or  peripheral edema.  +2 pedal and radial pulses bilaterally.  NEUROLOGIC: The patient is alert, awake, and oriented x3 with no focal motor or sensory deficits appreciated bilaterally.  SKIN: Moist and warm with no rashes appreciated.  Psych: Not anxious, depressed LN: No inguinal LN enlargement    Antibiotics   Anti-infectives (From admission, onward)   Start     Dose/Rate Route Frequency Ordered Stop   05/01/19 1000  Ampicillin-Sulbactam (UNASYN) 3 g in sodium chloride 0.9 % 100 mL IVPB     3 g 200 mL/hr over 30 Minutes Intravenous Every 6 hours 05/01/19 0601     04/30/19 1500  Ampicillin-Sulbactam (UNASYN) 3 g in sodium chloride 0.9 % 100 mL IVPB  Status:  Discontinued     3 g 200 mL/hr over 30 Minutes Intravenous Every 12 hours 04/30/19 1422 05/01/19 0601   04/30/19 1245  ceFEPIme (MAXIPIME) 2 g in sodium chloride 0.9 % 100 mL IVPB     2 g 200 mL/hr over 30 Minutes Intravenous  Once 04/30/19 1240 04/30/19 1335   04/30/19 1245  azithromycin (ZITHROMAX) 500 mg in sodium chloride 0.9 % 250 mL IVPB     500 mg 250 mL/hr over 60 Minutes Intravenous  Once 04/30/19 1240 04/30/19 1549      Medications   Scheduled Meds: . apixaban  2.5 mg Oral BID  . cholecalciferol  1,000 Units Oral Daily  . feeding supplement (ENSURE ENLIVE)  237 mL Oral TID BM   . lisinopril  5 mg Oral Daily  . multivitamin with minerals  1 tablet Oral Daily  . pantoprazole (PROTONIX) IV  40 mg Intravenous Q12H  . sodium chloride flush  3 mL Intravenous Q12H  . Telotristat Ethyl(as Etiprate)  250 mg Oral TID  . vitamin B-12  1,000 mcg Oral Daily   Continuous Infusions: . sodium chloride 250 mL (05/01/19 1741)  . ampicillin-sulbactam (UNASYN) IV Stopped (05/02/19 0540)   PRN Meds:.sodium chloride, acetaminophen **OR** acetaminophen, acetaminophen, albuterol, ondansetron **OR** ondansetron (ZOFRAN) IV, sodium chloride flush   Data Review:   Micro Results Recent Results (from the past 240 hour(s))  SARS Coronavirus 2 by RT PCR (hospital order, performed in [facility] hospital lab) Nasopharyngeal Nasopharyngeal Swab     Status: None   Collection Time: 04/30/19 10:47 AM   Specimen: Nasopharyngeal Swab  Result Value Ref Range Status   SARS Coronavirus 2 NEGATIVE NEGATIVE Final    Comment: (NOTE) If result is NEGATIVE SARS-CoV-2 target nucleic acids are NOT DETECTED. The SARS-CoV-2 RNA is generally detectable in upper and lower  respiratory specimens during the acute phase of infection. The lowest  concentration of SARS-CoV-2 viral copies this assay can detect is 250  copies / mL. A negative result does not preclude SARS-CoV-2 infection  and should not be used as the sole basis for treatment or other  patient management decisions.  A negative result may occur with  improper specimen collection / handling, submission of specimen other  than nasopharyngeal swab, presence of viral mutation(s) within the  areas targeted by this assay, and inadequate number of viral copies  (<250 copies / mL). A negative result must be combined with clinical  observations, patient history, and epidemiological information. If result is POSITIVE SARS-CoV-2 target nucleic acids are DETECTED. The SARS-CoV-2 RNA is generally detectable in upper and lower  respiratory specimens  dur ing the acute phase of infection.  Positive  results are indicative of active infection with SARS-CoV-2.  Clinical  correlation with patient history and  other diagnostic information is  necessary to determine patient infection status.  Positive results do  not rule out bacterial infection or co-infection with other viruses. If result is PRESUMPTIVE POSTIVE SARS-CoV-2 nucleic acids MAY BE PRESENT.   A presumptive positive result was obtained on the submitted specimen  and confirmed on repeat testing.  While 2019 novel coronavirus  (SARS-CoV-2) nucleic acids may be present in the submitted sample  additional confirmatory testing may be necessary for epidemiological  and / or clinical management purposes  to differentiate between  SARS-CoV-2 and other Sarbecovirus currently known to infect humans.  If clinically indicated additional testing with an alternate test  methodology [id number]) is advised. The SARS-CoV-2 RNA is generally  detectable in upper and lower respiratory sp ecimens during the acute  phase of infection. The expected result is Negative. Fact Sheet for Patients:  StrictlyIdeas.no Fact Sheet for Healthcare Providers: BankingDealers.co.za This test is not yet approved or cleared by the [place] FDA and has been authorized for detection and/or diagnosis of SARS-CoV-2 by FDA under an Emergency Use Authorization (EUA).  This EUA will remain in effect (meaning this test can be used) for the duration of the COVID-19 declaration under Section 564(b)(1) of the Act, 21 U.S.C. section 360bbb-3(b)(1), unless the authorization is terminated or revoked sooner. Performed at [facility], [address], [place], [place] [address]     Radiology Reports Ct Head Wo Contrast  Result Date: 04/30/2019 CLINICAL DATA:  83 year old female with unexplained altered mental status. Right side headache for 2 days. Difficulty  swallowing. 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 EXAM: CT HEAD WITHOUT CONTRAST TECHNIQUE: Contiguous axial images were obtained from the base of the skull through the vertex without intravenous contrast. COMPARISON:  Brain MRI 08/14/2018. Head CT 08/21/2018. FINDINGS: Brain: Left hemisphere encephalomalacia as expected from the February infarct. Superimposed confluent left deep white matter capsules small vessel related hypodensity. Elsewhere Stable gray-white matter differentiation throughout the brain. Stable cerebral volume. No midline shift, ventriculomegaly, mass effect, evidence of mass lesion, intracranial hemorrhage or cortically based infarction. Vascular: Calcified atherosclerosis at the skull base. New No suspicious intracranial vascular hyperdensity. Skull: Osteopenia.  No acute osseous abnormality identified. Sinuses/Orbits: Small fluid level in the left sphenoid sinus today where there was bubbly opacity previously. Elsewhere Visualized paranasal sinuses and mastoids are stable and well pneumatized. Other: No acute orbit or scalp soft tissue findings. IMPRESSION: 1. No acute intracranial abnormality. 2. Expected evolution of the left hemisphere infarct since February. Underlying chronic small vessel disease. 3. Mild inflammation in the left sphenoid sinus. Electronically Signed   By: Genevie Ann M.D.   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 EXAM: CT CHEST, ABDOMEN, AND PELVIS WITH CONTRAST TECHNIQUE: Multidetector CT imaging of the chest, abdomen and pelvis was performed following the standard protocol during bolus administration of intravenous contrast. CONTRAST:  50mL OMNIPAQUE IOHEXOL 300 MG/ML  SOLN COMPARISON:  03/17/2019, 07/18/2018 FINDINGS: CT CHEST FINDINGS Cardiovascular: Mild prominence of the ascending aorta 3.5 cm is noted. No dissection is  seen. Atherosclerotic calcifications are noted. Mild cardiomegaly is seen. The pulmonary artery as visualized is within normal limits. Coronary calcifications are noted. Mediastinum/Nodes: Thoracic inlet is within normal limits. No sizable hilar or mediastinal adenopathy is noted. The esophagus is within normal limits. Lungs/Pleura: Lungs demonstrate diffuse emphysematous changes with left lower lobe consolidation and mild effusion. This  is stable from the prior exam. No sizable parenchymal nodules are noted. Musculoskeletal: Degenerative changes of the thoracic spine are noted with increased kyphosis. Focal sclerosis is noted in the anterior aspect of the T10 vertebral body which was not seen on a prior exam from 2015. Possibility of metastatic disease deserves consideration. CT ABDOMEN PELVIS FINDINGS Hepatobiliary: The liver again demonstrates multiple enhancing lesions the largest of which lies within the left lobe of the liver. These are increased in number when compared with the prior exam consistent with progressive disease. The largest of these again lies within the left lobe of the liver. Gallbladder is decompressed. Pancreas: Mild prominence of the pancreatic duct is again noted and stable. Spleen: Normal in size without focal abnormality. Adrenals/Urinary Tract: Adrenal glands are within normal limits. The kidneys are well visualized. Previously seen right-sided hydronephrosis is stable from the recent noncontrast study of 03/17/2019. No obstructing stone is seen. The bladder is predominately decompressed. Stomach/Bowel: No obstructive or inflammatory changes of the bowel are noted. Postsurgical changes are seen. The appendix is not well appreciated. Vascular/Lymphatic: Vascular calcifications are noted. The previously seen hypodense lesion along the left margin of the liver has decreased in size now measuring approximately 11 mm in short axis. Persistent nodal mass is noted along the iliac chains  bilaterally. This is best seen on the right on image number 85 of series 2 and on the left on image number 92 of series 2. These changes are stable from the most recent noncontrast study. Central partially calcified mass within the mesentery along the anterior aspect of the aorta is stable in appearance. Reproductive: Postmenopausal uterus is noted with multiple surrounding veins similar to that seen on the prior exam. Other: No free fluid is noted.  No herniation is seen. Musculoskeletal: Stable sclerotic lesions are noted within the lumbar spine and sacrum as well as the left iliac bone. Stable compression deformities of L3 and L5 are noted. IMPRESSION: Increase in number of metastatic lesions within the liver when compared with the prior examination from January of 2020. The lack of IV contrast on the most recent exam precluded adequate evaluation. Persistent severe hydronephrosis on the right likely related to the nodal mass seen in the pelvis. Partially calcified mass lesion consistent with the known history of carcinoid within the central mesentery. Persistent lymphadenopathy is noted within the pelvis stable from the most recent noncontrast study. Chronic lower lobe consolidation on the left with small effusion. Sclerotic foci within the bony structures consistent with metastatic disease stable within the abdomen and pelvis. A lesion is noted at T10 which has not been recently evaluated but is likely chronic in nature. Electronically Signed   By: Inez Catalina M.D.   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 EXAM: CT CHEST, ABDOMEN, AND PELVIS WITH CONTRAST TECHNIQUE: Multidetector CT imaging of the chest, abdomen and pelvis was performed following the standard protocol during bolus administration of intravenous contrast. CONTRAST:  26mL OMNIPAQUE IOHEXOL  300 MG/ML  SOLN COMPARISON:  03/17/2019, 07/18/2018 FINDINGS: CT CHEST FINDINGS Cardiovascular: Mild prominence of the ascending aorta 3.5 cm is noted. No dissection is seen. Atherosclerotic calcifications are noted. Mild cardiomegaly is seen. The pulmonary artery as visualized is within normal limits. Coronary calcifications are noted. Mediastinum/Nodes: Thoracic inlet is within normal limits. No sizable hilar or mediastinal adenopathy is noted. The esophagus is within  normal limits. Lungs/Pleura: Lungs demonstrate diffuse emphysematous changes with left lower lobe consolidation and mild effusion. This is stable from the prior exam. No sizable parenchymal nodules are noted. Musculoskeletal: Degenerative changes of the thoracic spine are noted with increased kyphosis. Focal sclerosis is noted in the anterior aspect of the T10 vertebral body which was not seen on a prior exam from 2015. Possibility of metastatic disease deserves consideration. CT ABDOMEN PELVIS FINDINGS Hepatobiliary: The liver again demonstrates multiple enhancing lesions the largest of which lies within the left lobe of the liver. These are increased in number when compared with the prior exam consistent with progressive disease. The largest of these again lies within the left lobe of the liver. Gallbladder is decompressed. Pancreas: Mild prominence of the pancreatic duct is again noted and stable. Spleen: Normal in size without focal abnormality. Adrenals/Urinary Tract: Adrenal glands are within normal limits. The kidneys are well visualized. Previously seen right-sided hydronephrosis is stable from the recent noncontrast study of 03/17/2019. No obstructing stone is seen. The bladder is predominately decompressed. Stomach/Bowel: No obstructive or inflammatory changes of the bowel are noted. Postsurgical changes are seen. The appendix is not well appreciated. Vascular/Lymphatic: Vascular calcifications are noted. The previously seen hypodense  lesion along the left margin of the liver has decreased in size now measuring approximately 11 mm in short axis. Persistent nodal mass is noted along the iliac chains bilaterally. This is best seen on the right on image number 85 of series 2 and on the left on image number 92 of series 2. These changes are stable from the most recent noncontrast study. Central partially calcified mass within the mesentery along the anterior aspect of the aorta is stable in appearance. Reproductive: Postmenopausal uterus is noted with multiple surrounding veins similar to that seen on the prior exam. Other: No free fluid is noted.  No herniation is seen. Musculoskeletal: Stable sclerotic lesions are noted within the lumbar spine and sacrum as well as the left iliac bone. Stable compression deformities of L3 and L5 are noted. IMPRESSION: Increase in number of metastatic lesions within the liver when compared with the prior examination from January of 2020. The lack of IV contrast on the most recent exam precluded adequate evaluation. Persistent severe hydronephrosis on the right likely related to the nodal mass seen in the pelvis. Partially calcified mass lesion consistent with the known history of carcinoid within the central mesentery. Persistent lymphadenopathy is noted within the pelvis stable from the most recent noncontrast study. Chronic lower lobe consolidation on the left with small effusion. Sclerotic foci within the bony structures consistent with metastatic disease stable within the abdomen and pelvis. A lesion is noted at T10 which has not been recently evaluated but is likely chronic in nature. Electronically Signed   By: Inez Catalina M.D.   On: 04/30/2019 11:38   Dg Chest Portable 1 View  Result Date: 04/30/2019 CLINICAL DATA:  Patient reports right side headache and trouble swallowing x2 days. Patient also reports decreased taste. Hx of COPD, CHF, atrial fibrillation. Former smoker EXAM: PORTABLE CHEST - 1 VIEW  COMPARISON:  03/17/2019 FINDINGS: Persistent small left pleural effusion with adjacent consolidation/atelectasis at the left lung base. No overt interstitial edema. No new infiltrate. Heart size upper limits normal for technique. Aortic Atherosclerosis (ICD10-170.0). No pneumothorax. Visualized bones unremarkable. IMPRESSION: Persistent small left pleural effusion with adjacent atelectasis/consolidation. No acute findings. Electronically Signed   By: Lucrezia Europe M.D.   On: 04/30/2019 10:31   Dg Paulene Floor  Single Cm (sol Or Thin Ba)  Result Date: 05/01/2019 CLINICAL DATA:  Metastatic carcinoid tumor.  Dysphagia. EXAM: UPPER GI SERIES WITHOUT KUB TECHNIQUE: Routine upper GI series was performed with thin/high density/water soluble barium. FLUOROSCOPY TIME:  Fluoroscopy Time:  2 minutes 30 seconds Radiation Exposure Index (if provided by the fluoroscopic device): 29.9 mGy COMPARISON:  CT 04/30/2019, 07/18/2018. FINDINGS: Limited exam due to patient's condition. Esophagus is widely patent. No reflux. Stomach is widely patent without focal abnormality. Duodenum and C-loop appear normal. IV contrast from prior CT of 04/30/2019 noted in a hydronephrotic right kidney. Stable mild L3 compression fracture. Surgical clips in abdomen. IMPRESSION: 1. Normal upper GI. No obstructing abnormality identified. No reflux. 2. IV contrast from prior CT of 04/30/2019 noted in a hydronephrotic right kidney. Electronically Signed   By: Marcello Moores  Register   On: 05/01/2019 08:07   Vas [name] Lower Extremity Venous Reflux  Result Date: 04/15/2019  Lower Venous Reflux Study Indications: Pain.  Performing Technologist: Charlane Ferretti RT (R)(VS)  Examination Guidelines: A complete evaluation includes B-mode imaging, spectral Doppler, color Doppler, and power Doppler as needed of all accessible portions of each vessel. Bilateral testing is considered an integral part of a complete examination. Limited examinations for reoccurring indications may be  performed as noted. The reflux portion of the exam is performed with the patient in reverse Trendelenburg.  +---------+---------------+---------+-----------+----------+--------------+ RIGHT    CompressibilityPhasicitySpontaneityPropertiesThrombus Aging +---------+---------------+---------+-----------+----------+--------------+ CFV      Full                                                        +---------+---------------+---------+-----------+----------+--------------+ SFJ      Full                                                        +---------+---------------+---------+-----------+----------+--------------+ FV Prox  Full                                                        +---------+---------------+---------+-----------+----------+--------------+ FV Mid   Full                                                        +---------+---------------+---------+-----------+----------+--------------+ FV DistalFull                                                        +---------+---------------+---------+-----------+----------+--------------+ POP      Full                                                        +---------+---------------+---------+-----------+----------+--------------+  GSV      Full                                                        +---------+---------------+---------+-----------+----------+--------------+ SSV      Partial                                                     +---------+---------------+---------+-----------+----------+--------------+  +---------+---------------+---------+-----------+----------+--------------+ LEFT     CompressibilityPhasicitySpontaneityPropertiesThrombus Aging +---------+---------------+---------+-----------+----------+--------------+ CFV      Full                                                        +---------+---------------+---------+-----------+----------+--------------+ SFJ      Full                                                         +---------+---------------+---------+-----------+----------+--------------+ FV Prox  Full                                                        +---------+---------------+---------+-----------+----------+--------------+ FV Mid   Full                                                        +---------+---------------+---------+-----------+----------+--------------+ FV DistalFull                                                        +---------+---------------+---------+-----------+----------+--------------+ POP      Full                                                        +---------+---------------+---------+-----------+----------+--------------+ GSV      Full                                                        +---------+---------------+---------+-----------+----------+--------------+ SSV      None                                                        +---------+---------------+---------+-----------+----------+--------------+  Venous Reflux Times Normal value < 0.5 sec +---+---------+    Left (ms) +---+---------+ [id number]   +---+---------+ Summary: Right: There is no evidence of deep vein thrombosis in the lower extremity.There is no evidence of superficial venous thrombosis. Left: There is no evidence of deep vein thrombosis in the lower extremity.There is no evidence of superficial venous thrombosis.  *See table(s) above for measurements and observations. Electronically signed by Leotis Pain MD on 04/15/2019 at 4:58:18 PM.    Final      CBC Recent Labs  Lab 04/30/19 1009 05/01/19 0411  WBC 15.9* 17.8*  HGB 14.2 13.9  HCT 44.1 44.1  PLT 352 358  MCV 78.8* 78.5*  MCH 25.4* 24.7*  MCHC 32.2 31.5  RDW 19.1* 18.9*  LYMPHSABS 1.5  --   MONOABS 0.7  --   EOSABS 0.0  --   BASOSABS 0.1  --     Chemistries  Recent Labs  Lab 04/30/19 1009 05/01/19 0411  NA 144 145  K 3.5 3.7  CL 103 106   CO2 28 28  GLUCOSE 110* 117*  BUN 22 23  CREATININE 0.93 0.79  CALCIUM 8.5* 8.4*  AST 30  --   ALT 16  --   ALKPHOS 121  --   BILITOT 0.7  --    ------------------------------------------------------------------------------------------------------------------ estimated creatinine clearance is 33.6 mL/min (by C-G formula based on SCr of 0.79 mg/dL). ------------------------------------------------------------------------------------------------------------------ No results for input(s): HGBA1C in the last 72 hours. ------------------------------------------------------------------------------------------------------------------ No results for input(s): CHOL, HDL, LDLCALC, TRIG, CHOLHDL, LDLDIRECT in the last 72 hours. ------------------------------------------------------------------------------------------------------------------ No results for input(s): TSH, T4TOTAL, T3FREE, THYROIDAB in the last 72 hours.  Invalid input(s): FREET3 ------------------------------------------------------------------------------------------------------------------ No results for input(s): VITAMINB12, FOLATE, FERRITIN, TIBC, IRON, RETICCTPCT in the last 72 hours.  Coagulation profile No results for input(s): INR, PROTIME in the last 168 hours.  No results for input(s): DDIMER in the last 72 hours.  Cardiac Enzymes No results for input(s): CKMB, TROPONINI, MYOGLOBIN in the last 168 hours.  Invalid input(s): CK ------------------------------------------------------------------------------------------------------------------ Invalid input(s): [place]   Patient is 83 year old presenting with dysphagia noted to have a possible pneumonia  1.  Aspiration pneumonia continue IV Unasyn  Plan for modified barium swallow today Speech eval appreciated WBC count continues to be elevated I have asked ID to see the patient    2.  Dysphagia upper GI series without evidence of any  obstruction Continue patient on PPI  3.  Carcinoid tumor prognosis very poor discussed with the patient daughter recommended hospice services, they are not wanting hospice services for now  4.  COPD without exasperation we will use nebs as needed  5.  Essential hypertension continue metoprolol and lisinopril  6.  Chronic CHF currently compensated continue Lasix and lisinopril      Code Status Orders  (From admission, onward)         Start     Ordered   04/30/19 1646  Do not attempt resuscitation (DNR)  Continuous    Question Answer Comment  In the event of cardiac or respiratory ARREST Do not call a "code blue"   In the event of cardiac or respiratory ARREST Do not perform Intubation, CPR, defibrillation or ACLS   In the event of cardiac or respiratory ARREST Use medication by any route, position, wound care, and other measures to relive pain and suffering. May use oxygen, suction and manual treatment of airway obstruction as needed for comfort.      04/30/19  1645        Code Status History    Date Active Date Inactive Code Status Order ID Comments User Context   10/27/2018 1026 10/30/2018 1711 DNR [id number]  Bettey Costa, MD Inpatient   10/26/2018 2000 10/27/2018 1026 Full Code [id number]  Saundra Shelling, MD Inpatient   08/14/2018 0300 08/15/2018 1605 Full Code [id number]  Lance Coon, MD Inpatient   12/15/2014 1933 01/06/2015 1806 Full Code [id number]  Corey Harold Inpatient   12/05/2014 0340 12/15/2014 1933 Full Code [id number]  Juluis Mire, MD Inpatient   Advance Care Planning Activity           Consults none  DVT Prophylaxis  Lovenox  Lab Results  Component Value Date   PLT 358 05/01/2019     Time Spent in minutes   35-minute greater than 50% of time spent in care coordination and counseling patient regarding the condition and plan of care.   Dustin Flock M.D on 05/02/2019 at 11:25 AM  Between 7am to 6pm - Pager - [phone]  After 6pm go to  [email or web address] - Proofreader  [employer]   Office  [phone]

## 2019-05-02 NOTE — Care Management Important Message (Signed)
Important Message  Patient Details  Name: Veronica Wang MRN: [id number] Date of Birth: 1935-10-14   Medicare Important Message Given:  Yes     [name] 05/02/2019, 10:57 AM
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
## 2019-05-02 NOTE — Progress Notes (Signed)
New referral for [facility] to follow at home received from [facility]. Patient to also be followed by [facility] home health. Possible d/c over the weekend. Patient information given to Palliative admin. Flo Shanks BSN, RN, [place] [phone]

## 2019-05-02 NOTE — Evaluation (Signed)
Objective Swallowing Evaluation: Type of Study: MBS-Modified Barium Swallow Study   Patient Details  Name: Veronica Wang MRN: [id number] Date of Birth: 03/09/1936  Today's Date: 05/02/2019 Time: SLP Start Time (ACUTE ONLY): 1050 -SLP Stop Time (ACUTE ONLY): 1200  SLP Time Calculation (min) (ACUTE ONLY): 70 min   Past Medical History:  Past Medical History:  Diagnosis Date  . Abnormal MRI 09/10/2015  . Atrial fibrillation ([place])   . Cancer ([place])    Liver  . Cancer ([place])    stomach  . Carcinoid tumor of intestine   . CHF (congestive heart failure) ([place])   . COPD (chronic obstructive pulmonary disease) ([place])   . FH: chemotherapy   . GERD (gastroesophageal reflux disease)   . Hyperlipidemia   . Hypertension   . Macular degeneration   . Polycythemia   . Radiation    Past Surgical History:  Past Surgical History:  Procedure Laterality Date  . CATARACT EXTRACTION  splenectomy  . COLON RESECTION    . COLON SURGERY    . GASTRECTOMY    . SPLENECTOMY     No data recorded  Subjective: pt awake, alert and verbally engaging w/ others in room. She was quite talkative and seemed to enjoy company. She followed commands appropriately.     Assessment / Plan / Recommendation  CHL IP CLINICAL IMPRESSIONS 05/02/2019  Clinical Impression Pt presents w/ Mild-Moderate oropharyngeal phase Dysphagia w/ increased Risk for Aspiration w/ oral intake thus risk for Pulmonary impact/decline. Pt exhibits both pharyngeal phase dysphagia and Esophageal phase dysmotility -- ANY Esophageal phase deficits can increase risk for Regurgitation thus risk for aspiration of Reflux material. Suspect pt's pharyngeal phase deficits could be impacted by her Esophageal phase deficits; previous CVA as well. Pt described "years of terrible burning Reflux". During the pharyngeal phase, pt exhibited a Delayed pharyngeal swallow initiation w/ Cup sip trials of Thin and Nectar liquids spilling from the Valleculae to the  Pyriform Sinuses(moreso noted w/ Thin liquids, and larger sips) b/f full initiation of pharyngeal swallow occurred. Noted reduced, tight airway closure in a timely manner resulting in Trace, Transient laryngeal Penetration of Thin liquids --- NO Aspiration occurred during this study. A chin tuck strategy was not useful; pt did use a Throat Clearing strategy intermittently b/t trials to aid protection of airway. NO Build-up from the transient laryngeal Penetration was noted. The occurrence of larygneal Penetration appeared related to bolus Size; less occurrence was noted when pt used SMALL, SINGLE SIPS SLOWLY. Diffuse, min residue remained in the pharynx intermittently (BOT, Pyriform Sinuses, Valleculae) indicating decreased pharyngeal pressure and laryngeal excursion during the swallow. When pt utilized a f/u, DRY swallow and/or alternated foods/liquids, this cleared the pharyngeal residue. Of note, min residue along the Pharygneal Wall occurred at the level of the apparent Kyphosis -- this too cleared w/ a f/u, DRY swallow. This presentation of delayed pharyngeal swallow and pharyngeal residue post swallowing increases pt's risk for laryngeal Penetration, and Aspiration, during and post swallows at meals. During the oral phase, pt presented w/ fair-good oral control of boluses w/ min, inconsistent loss of bolus control of thin liquids w/ premature spillage into the pharynx. Adequate oral clearing achieved w/ each trial consistency. Pt is significantly challenged by Full Solid Foods d/t her inability to effectively and fully Masticate secondary to a Broken Bottom Denture plate and Missing Dentition. She requires that all foods be mashed/broken down and moistened well. During the Esophageal phase, Min+ Esophageal dysmotility in the Cervical Esophagus noted moreso  w/ Food consistency trials resulting in Min bolus stasis, residue. Pt has described that she experiences "food coming back up on me" and that she has to  vomit(this could be related to the reduced Esophageal clearing of oral intake).  SLP Visit Diagnosis Dysphagia, oropharyngeal phase (R13.12)  Attention and concentration deficit following --  Frontal lobe and executive function deficit following --  Impact on safety and function Mild aspiration risk;Moderate aspiration risk;Risk for inadequate nutrition/hydration      CHL IP TREATMENT RECOMMENDATION 05/02/2019  Treatment Recommendations Therapy as outlined in treatment plan below     Prognosis 05/02/2019  Prognosis for Safe Diet Advancement Fair  Barriers to Reach Goals Time post onset;Severity of deficits  Barriers/Prognosis Comment --    CHL IP DIET RECOMMENDATION 05/02/2019  SLP Diet Recommendations Dysphagia 2 (Fine chop) solids;Thin liquid  Liquid Administration via Cup;No straw  Medication Administration Crushed with puree  Compensations Minimize environmental distractions;Slow rate;Small sips/bites;Lingual sweep for clearance of pocketing;Multiple dry swallows after each bite/sip;Follow solids with liquid;Clear throat intermittently  Postural Changes Remain semi-upright after after feeds/meals (Comment);Seated upright at 90 degrees      CHL IP OTHER RECOMMENDATIONS 05/02/2019  Recommended Consults Consider GI evaluation;Consider esophageal assessment  Oral Care Recommendations Oral care BID;Patient independent with oral care  Other Recommendations (No Data)      CHL IP FOLLOW UP RECOMMENDATIONS 05/02/2019  Follow up Recommendations Skilled Nursing facility      [facility] IP FREQUENCY AND DURATION 05/02/2019  Speech Therapy Frequency (ACUTE ONLY) min 3x week  Treatment Duration 2 weeks           CHL IP ORAL PHASE 05/02/2019  Oral Phase Impaired  Oral - Pudding Teaspoon --  Oral - Pudding Cup --  Oral - Honey Teaspoon --  Oral - Honey Cup NT  Oral - Nectar Teaspoon --  Oral - Nectar Cup 2   Oral - Nectar Straw --  Oral - Thin Teaspoon --  Oral - Thin Cup 9  Oral -  Thin Straw --  Oral - Puree 3  Oral - Mech Soft 2 - mashed, moistened  Oral - Regular --  Oral - Multi-Consistency --  Oral - Pill --  Oral Phase - Comment pt presented w/ fair-good oral control of boluses w/ min, inconsistent loss of bolus control w/ premature spillage into the pharynx. Adequate oral clearing achieved w/ each trial consistency.     CHL IP PHARYNGEAL PHASE 05/02/2019  Pharyngeal Phase Impaired  Pharyngeal- Pudding Teaspoon --  Pharyngeal --  Pharyngeal- Pudding Cup --  Pharyngeal --  Pharyngeal- Honey Teaspoon --  Pharyngeal --  Pharyngeal- Honey Cup NT  Pharyngeal --  Pharyngeal- Nectar Teaspoon --  Pharyngeal --  Pharyngeal- Nectar Cup 2  Pharyngeal --  Pharyngeal- Nectar Straw --  Pharyngeal --  Pharyngeal- Thin Teaspoon --  Pharyngeal --  Pharyngeal- Thin Cup 9  Pharyngeal --  Pharyngeal- Thin Straw --  Pharyngeal --  Pharyngeal- Puree 3  Pharyngeal --  Pharyngeal- Mechanical Soft 2 - mashed, moistened  Pharyngeal --  Pharyngeal- Regular --  Pharyngeal --  Pharyngeal- Multi-consistency --  Pharyngeal --  Pharyngeal- Pill --  Pharyngeal --  Pharyngeal Comment --     CHL IP CERVICAL ESOPHAGEAL PHASE 05/02/2019  Cervical Esophageal Phase Impaired  Pudding Teaspoon --  Pudding Cup --  Honey Teaspoon --  Honey Cup NT  Nectar Teaspoon --  Nectar Cup 2  Nectar Straw --  Thin Teaspoon --  Thin Cup 9  Thin  Straw --  Puree 3  Mechanical Soft 2 - mashed, moistened  Regular --  Multi-consistency --  Pill --  Cervical Esophageal Comment Min+ Esophageal dysmotility in the Cervical Esophagus noted moreso w/ Food consistency trials resulting in Min bolus stasis, residue.          Orinda Kenner, MS, CCC-SLP Watson,Katherine 05/02/2019, 2:10 PM

## 2019-05-02 NOTE — TOC Initial Note (Addendum)
Transition of Care [facility]) - Initial/Assessment Note    Patient Details  Name: Veronica Wang MRN: [id number] Date of Birth: [birth year]  [facility]) CM/SW Contact:    Norina Buzzard, RN Phone Number: 05/02/2019, 3:47 PM  Clinical Narrative: Met with pt's daughter Judeen Hammans). Pt lives in an apartment by herself. She has a good family support. She is independent with ADLs and she  drives. She has a PCP. She has a W/C, cane, RW and a bedside commode, but daughter reports that she doesn't use any of these DME. Pt uses O2 prn at home. [employer] provides the home O2. Daughter reports that they are having issues with [employer] because they are not picking up the empty tanks and she is requesting assistance to switch to another agency. Pt needs HH services (RN and ST). Provided daughter with a CMS Medicare.gov compare list. She chose [facility]. Malachy Mood with [facility] contacted for [facility] referral by [place] and she accepted the referral. Daughter stated that hospice was discussed with her mother but her mother is not interested. Palliative care explained with daughter and she agreed with the referral. Referral given to [facility] with [facility] for outpt palliative. Discussed agency for the home O2 and daughter doesn't have a preference for an agency. Contacted Ashley at [facility] for home O2 referral. Caryl Pina called CM back after checking pt's insurance and he reports that pt is able to switch companies. Notified Dr. Posey Pronto that pt needs an order for home O2. Will continue to f/u to assist with the D/C plan.                Expected Discharge Plan: [place]     Patient Goals and CMS Choice Patient states their goals for this hospitalization and ongoing recovery are:: to get better CMS Medicare.gov Compare Post Acute Care list provided to:: Other (Comment Required)(Daughter Malachy Mood)) Choice offered to / list presented to : Adult Children  Expected Discharge Plan and  Services Expected Discharge Plan: [place]   Discharge Planning Services: CM Consult Post Acute Care Choice: Home Health                   DME Arranged: Oxygen DME Agency: [facility] Date DME Agency Contacted: 05/02/19 Time DME Agency Contacted: [phone] Representative spoke with at DME Agency: Caryl Pina HH Arranged: RN, Speech Therapy   Date [place]: 05/02/19 Time [place]: 0330 Representative spoke with at [place]: [place] Arrangements/Services   Lives with:: Self Patient language and need for interpreter reviewed:: Yes Do you feel safe going back to the place where you live?: Yes      Need for Family Participation in Patient Care: Yes (Comment) Care giver support system in place?: Yes (comment) Current home services: DME(ST) Criminal Activity/Legal Involvement Pertinent to Current Situation/Hospitalization: No - Comment as needed  Activities of Daily Living Home Assistive Devices/Equipment: Cane (specify quad or straight), Walker (specify type), Oxygen ADL Screening (condition at time of admission) Patient's cognitive ability adequate to safely complete daily activities?: Yes Is the patient deaf or have difficulty hearing?: No Does the patient have difficulty seeing, even when wearing glasses/contacts?: No Does the patient have difficulty concentrating, remembering, or making decisions?: No Patient able to express need for assistance with ADLs?: Yes Does the patient have difficulty dressing or bathing?: No Independently performs ADLs?: Yes (appropriate for developmental age) Does the patient have difficulty walking or climbing stairs?: No Weakness of Legs: None  Weakness of Arms/Hands: None  Permission Sought/Granted Permission sought to share information with : Case Manager Permission granted to share information with : Yes, Verbal Permission Granted              Emotional Assessment Appearance:: Appears stated age,  Well-Groomed     Orientation: : Oriented to Self, Oriented to Place, Oriented to  Time, Oriented to Situation   Psych Involvement: No (comment)  Admission diagnosis:  Dysphagia [R13.10] Hydronephrosis, unspecified hydronephrosis type [N13.30] Pneumonia of left lower lobe due to infectious organism [J18.9] Patient Active Problem List   Diagnosis Date Noted  . Aspiration pneumonia ([place]) 04/30/2019  . Lower limb ulcer, calf ([place]) 03/25/2019  . Swelling of limb 03/25/2019  . Gross hematuria 03/23/2019  . Acute exacerbation of CHF (congestive heart failure) ([place]) 10/26/2018  . Protein-calorie malnutrition, severe 08/14/2018  . Stroke ([place]) 08/13/2018  . HTN (hypertension) 08/13/2018  . HLD (hyperlipidemia) 08/13/2018  . Chronic combined systolic (congestive) and diastolic (congestive) heart failure ([place]) 11/27/2017  . PAF (paroxysmal atrial fibrillation) ([place]) 11/27/2017  . Abnormal MRI 09/10/2015  . Malnutrition of moderate degree ([place]) 12/06/2014  . Pneumonia 12/05/2014  . COPD (chronic obstructive pulmonary disease) ([place]) 12/05/2014  . Carcinoid tumor of intestine 11/20/2014   PCP:  Adin Hector, MD Pharmacy:   [facility] [address] [place] [address] Phone: [phone] Fax: [phone]     Social Determinants of Health (SDOH) Interventions    Readmission Risk Interventions Readmission Risk Prevention Plan 10/28/2018  Transportation Screening Complete  PCP or Specialist Appt within 5-7 Days Not Complete  Not Complete comments Not DC'ing yet  Home Care Screening Complete  Medication Review (RN CM) Complete  Some recent data might be hidden

## 2019-05-03 LAB — CBC WITH DIFFERENTIAL/PLATELET
Abs Immature Granulocytes: 0.21 10*3/uL — ABNORMAL HIGH (ref 0.00–0.07)
Basophils Absolute: 0.1 10*3/uL (ref 0.0–0.1)
Basophils Relative: 1 %
Eosinophils Absolute: 0.1 10*3/uL (ref 0.0–0.5)
Eosinophils Relative: 1 %
HCT: 44.1 % (ref 36.0–46.0)
Hemoglobin: 13.8 g/dL (ref 12.0–15.0)
Immature Granulocytes: 1 %
Lymphocytes Relative: 11 %
Lymphs Abs: 1.7 10*3/uL (ref 0.7–4.0)
MCH: 25 pg — ABNORMAL LOW (ref 26.0–34.0)
MCHC: 31.3 g/dL (ref 30.0–36.0)
MCV: 79.7 fL — ABNORMAL LOW (ref 80.0–100.0)
Monocytes Absolute: 0.6 10*3/uL (ref 0.1–1.0)
Monocytes Relative: 4 %
Neutro Abs: 12.8 10*3/uL — ABNORMAL HIGH (ref 1.7–7.7)
Neutrophils Relative %: 82 %
Platelets: 315 10*3/uL (ref 150–400)
RBC: 5.53 MIL/uL — ABNORMAL HIGH (ref 3.87–5.11)
RDW: 19.7 % — ABNORMAL HIGH (ref 11.5–15.5)
WBC: 15.5 10*3/uL — ABNORMAL HIGH (ref 4.0–10.5)
nRBC: 0 % (ref 0.0–0.2)

## 2019-05-03 MED ORDER — PANTOPRAZOLE SODIUM 40 MG PO TBEC: 40.0000 mg | DELAYED_RELEASE_TABLET | Freq: Every day | ORAL | 11 refills | Status: AC

## 2019-05-03 NOTE — Plan of Care (Signed)

## 2019-05-03 NOTE — Progress Notes (Signed)
  Speech Language Pathology Treatment: Dysphagia  Patient Details Name: Veronica Wang MRN: [id number] DOB: 11-Jun-1936 Today's Date: 05/03/2019 Time: [id number] SLP Time Calculation (min) (ACUTE ONLY): 45 min  Assessment / Plan / Recommendation Clinical Impression  Pt and Dtr given education on aspiration precautions; diet consistency and options/preparation. All agreed. Handouts given. Pt tolerated Pills Crushed in Puree w/ NSG while in room. Discussed use of a Dysphagia drink cup w/ Dtr/pt.   HPI        SLP Plan  Continue with current plan of care(education)       Recommendations  Diet recommendations: Dysphagia 2 (fine chop);Thin liquid Liquids provided via: Cup;No straw Medication Administration: Crushed with puree(for easier, safer swallowing) Supervision: Patient able to self feed;Intermittent supervision to cue for compensatory strategies Compensations: Minimize environmental distractions;Slow rate;Small sips/bites;Lingual sweep for clearance of pocketing;Multiple dry swallows after each bite/sip;Follow solids with liquid Postural Changes and/or Swallow Maneuvers: Seated upright 90 degrees;Upright 30-60 min after meal                General recommendations: (Dietician f/u) Oral Care Recommendations: Oral care BID;Patient independent with oral care Follow up Recommendations: (TBD) SLP Visit Diagnosis: Dysphagia, oropharyngeal phase (R13.12)(poor dentition state) Plan: Continue with current plan of care(education)       GO                 Orinda Kenner, MS, CCC-SLP [place] Bierlein 05/03/2019, 11:23 AM

## 2019-05-03 NOTE — TOC Transition Note (Signed)
Transition of Care [facility]) - CM/SW Discharge Note   Patient Details  Name: Veronica Wang MRN: [id number] Date of Birth: [birth year]  [facility] CM/SW Contact:  Weston Anna, LCSW Phone Number: 05/03/2019, 2:13 PM   Clinical Narrative:     Patient set to discharge home with [facility] services- PT/OT orders placed by MD. [facility] accepted referral earlier this week and will follow up with patient once at him- confirmed with Cherly. No other needs at this time.   Final next level of care: [place] Barriers to Discharge: No Barriers Identified   Patient Goals and CMS Choice Patient states their goals for this hospitalization and ongoing recovery are:: to get better CMS Medicare.gov Compare Post Acute Care list provided to:: Other (Comment Required)(Daughter Malachy Mood)) Choice offered to / list presented to : Adult Children  Discharge Placement                       Discharge Plan and Services   Discharge Planning Services: CM Consult Post Acute Care Choice: Home Health          DME Arranged: N/A DME Agency: [facility] Date DME Agency Contacted: 05/02/19 Time DME Agency Contacted: [phone] Representative spoke with at DME Agency: Caryl Pina HH Arranged: PT, OT So [facility] Agency: [place] Date [place]: 05/03/19 Time [place]: [id number] Representative spoke with at [place]: [place] ([place]) Interventions     Readmission Risk Interventions Readmission Risk Prevention Plan 10/28/2018  Transportation Screening Complete  PCP or Specialist Appt within 5-7 Days Not Complete  Not Complete comments Not [place] yet  Home Care Screening Complete  Medication Review (RN CM) Complete  Some recent data might be hidden

## 2019-05-04 NOTE — Discharge Summary (Signed)
[employer] [facility] [name] at [facility] 83 y.o., DOB 09/19/1935, MRN [id number]. 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 female with a known history of metastatic carcinoid tumor, congestive heart failure, COPD, GERD, hypertension, hyperlipidemia, macular degeneration who is presenting to the hospital with complaint of dysphagia and headaches.  Patient states that she has been having progressive swelling to trouble over the past few weeks.  In the emergency room she was admitted with CT scan of the chest which showed consolidation in her lungs.    Patient was evaluated in the ER and we were recommended to admit patient for pneumonia.  She was treated with antibiotics.  Due to persistently elevated white blood cell count infectious disease consult was obtained.  They did not feel that patient's white blood cell count elevation was due to pneumonia.  The consolidation appears to be chronic.  She did complain of dysphagia and underwent upper GI series and a swallow eval.  Speech recommended dysphagia diet.  We have recommended patient stick to this diet.           Consults  None  Significant Tests:  See full reports for all details     Ct Head Wo Contrast  Result Date: 04/30/2019 CLINICAL DATA:  83 year old female with unexplained  altered mental status. Right side headache for 2 days. Difficulty swallowing. 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 EXAM: CT HEAD WITHOUT CONTRAST TECHNIQUE: Contiguous axial images were obtained from the base of the skull through the vertex without intravenous contrast. COMPARISON:  Brain MRI 08/14/2018. Head CT 08/21/2018. FINDINGS: Brain: Left hemisphere encephalomalacia as expected from the February infarct. Superimposed confluent left deep white matter capsules small vessel related hypodensity. Elsewhere Stable gray-white matter differentiation throughout the brain. Stable cerebral volume. No midline shift, ventriculomegaly, mass effect, evidence of mass lesion, intracranial hemorrhage or cortically based infarction. Vascular: Calcified atherosclerosis at the skull base. New No suspicious intracranial vascular hyperdensity. Skull: Osteopenia.  No acute osseous abnormality identified. Sinuses/Orbits: Small fluid level in the left sphenoid sinus today where there was bubbly opacity previously. Elsewhere Visualized paranasal sinuses and mastoids are stable and well pneumatized. Other: No acute orbit or scalp soft tissue findings. IMPRESSION: 1. No acute intracranial abnormality. 2. Expected evolution of the left hemisphere infarct since February. Underlying chronic small vessel disease. 3. Mild inflammation in the left sphenoid sinus. Electronically Signed   By: Genevie Ann M.D.   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 EXAM: CT CHEST, ABDOMEN, AND PELVIS WITH CONTRAST TECHNIQUE: Multidetector CT imaging of the chest, abdomen and pelvis was performed following the standard protocol during bolus administration of intravenous contrast. CONTRAST:  43mL OMNIPAQUE IOHEXOL 300 MG/ML  SOLN COMPARISON:  03/17/2019, 07/18/2018 FINDINGS: CT CHEST FINDINGS Cardiovascular: Mild  prominence of the ascending aorta  3.5 cm is noted. No dissection is seen. Atherosclerotic calcifications are noted. Mild cardiomegaly is seen. The pulmonary artery as visualized is within normal limits. Coronary calcifications are noted. Mediastinum/Nodes: Thoracic inlet is within normal limits. No sizable hilar or mediastinal adenopathy is noted. The esophagus is within normal limits. Lungs/Pleura: Lungs demonstrate diffuse emphysematous changes with left lower lobe consolidation and mild effusion. This is stable from the prior exam. No sizable parenchymal nodules are noted. Musculoskeletal: Degenerative changes of the thoracic spine are noted with increased kyphosis. Focal sclerosis is noted in the anterior aspect of the T10 vertebral body which was not seen on a prior exam from 2015. Possibility of metastatic disease deserves consideration. CT ABDOMEN PELVIS FINDINGS Hepatobiliary: The liver again demonstrates multiple enhancing lesions the largest of which lies within the left lobe of the liver. These are increased in number when compared with the prior exam consistent with progressive disease. The largest of these again lies within the left lobe of the liver. Gallbladder is decompressed. Pancreas: Mild prominence of the pancreatic duct is again noted and stable. Spleen: Normal in size without focal abnormality. Adrenals/Urinary Tract: Adrenal glands are within normal limits. The kidneys are well visualized. Previously seen right-sided hydronephrosis is stable from the recent noncontrast study of 03/17/2019. No obstructing stone is seen. The bladder is predominately decompressed. Stomach/Bowel: No obstructive or inflammatory changes of the bowel are noted. Postsurgical changes are seen. The appendix is not well appreciated. Vascular/Lymphatic: Vascular calcifications are noted. The previously seen hypodense lesion along the left margin of the liver has decreased in size now measuring approximately 11 mm in short  axis. Persistent nodal mass is noted along the iliac chains bilaterally. This is best seen on the right on image number 85 of series 2 and on the left on image number 92 of series 2. These changes are stable from the most recent noncontrast study. Central partially calcified mass within the mesentery along the anterior aspect of the aorta is stable in appearance. Reproductive: Postmenopausal uterus is noted with multiple surrounding veins similar to that seen on the prior exam. Other: No free fluid is noted.  No herniation is seen. Musculoskeletal: Stable sclerotic lesions are noted within the lumbar spine and sacrum as well as the left iliac bone. Stable compression deformities of L3 and L5 are noted. IMPRESSION: Increase in number of metastatic lesions within the liver when compared with the prior examination from January of 2020. The lack of IV contrast on the most recent exam precluded adequate evaluation. Persistent severe hydronephrosis on the right likely related to the nodal mass seen in the pelvis. Partially calcified mass lesion consistent with the known history of carcinoid within the central mesentery. Persistent lymphadenopathy is noted within the pelvis stable from the most recent noncontrast study. Chronic lower lobe consolidation on the left with small effusion. Sclerotic foci within the bony structures consistent with metastatic disease stable within the abdomen and pelvis. A lesion is noted at T10 which has not been recently evaluated but is likely chronic in nature. Electronically Signed   By: Inez Catalina M.D.   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 EXAM: CT CHEST, ABDOMEN, AND PELVIS WITH CONTRAST TECHNIQUE: Multidetector CT imaging of the chest, abdomen and pelvis was performed following the standard protocol during bolus  administration of intravenous contrast. CONTRAST:  96mL OMNIPAQUE IOHEXOL 300 MG/ML  SOLN COMPARISON:  03/17/2019, 07/18/2018 FINDINGS: CT CHEST FINDINGS Cardiovascular: Mild prominence of the ascending aorta 3.5 cm is noted. No dissection is seen. Atherosclerotic calcifications are noted. Mild cardiomegaly is seen. The pulmonary artery as visualized is within normal limits. Coronary calcifications are noted. Mediastinum/Nodes: Thoracic inlet is within normal limits. No sizable hilar or mediastinal adenopathy is noted. The esophagus is within normal limits. Lungs/Pleura: Lungs demonstrate diffuse emphysematous changes with left lower lobe consolidation and mild effusion. This is stable from the prior exam. No sizable parenchymal nodules are noted. Musculoskeletal: Degenerative changes of the thoracic spine are noted with increased kyphosis. Focal sclerosis is noted in the anterior aspect of the T10 vertebral body which was not seen on a prior exam from 2015. Possibility of metastatic disease deserves consideration. CT ABDOMEN PELVIS FINDINGS Hepatobiliary: The liver again demonstrates multiple enhancing lesions the largest of which lies within the left lobe of the liver. These are increased in number when compared with the prior exam consistent with progressive disease. The largest of these again lies within the left lobe of the liver. Gallbladder is decompressed. Pancreas: Mild prominence of the pancreatic duct is again noted and stable. Spleen: Normal in size without focal abnormality. Adrenals/Urinary Tract: Adrenal glands are within normal limits. The kidneys are well visualized. Previously seen right-sided hydronephrosis is stable from the recent noncontrast study of 03/17/2019. No obstructing stone is seen. The bladder is predominately decompressed. Stomach/Bowel: No obstructive or inflammatory changes of the bowel are noted. Postsurgical changes are seen. The appendix is not well appreciated.  Vascular/Lymphatic: Vascular calcifications are noted. The previously seen hypodense lesion along the left margin of the liver has decreased in size now measuring approximately 11 mm in short axis. Persistent nodal mass is noted along the iliac chains bilaterally. This is best seen on the right on image number 85 of series 2 and on the left on image number 92 of series 2. These changes are stable from the most recent noncontrast study. Central partially calcified mass within the mesentery along the anterior aspect of the aorta is stable in appearance. Reproductive: Postmenopausal uterus is noted with multiple surrounding veins similar to that seen on the prior exam. Other: No free fluid is noted.  No herniation is seen. Musculoskeletal: Stable sclerotic lesions are noted within the lumbar spine and sacrum as well as the left iliac bone. Stable compression deformities of L3 and L5 are noted. IMPRESSION: Increase in number of metastatic lesions within the liver when compared with the prior examination from January of 2020. The lack of IV contrast on the most recent exam precluded adequate evaluation. Persistent severe hydronephrosis on the right likely related to the nodal mass seen in the pelvis. Partially calcified mass lesion consistent with the known history of carcinoid within the central mesentery. Persistent lymphadenopathy is noted within the pelvis stable from the most recent noncontrast study. Chronic lower lobe consolidation on the left with small effusion. Sclerotic foci within the bony structures consistent with metastatic disease stable within the abdomen and pelvis. A lesion is noted at T10 which has not been recently evaluated but is likely chronic in nature. Electronically Signed   By: Inez Catalina M.D.   On: 04/30/2019 11:38   Dg Chest Portable 1 View  Result Date: 04/30/2019 CLINICAL DATA:  Patient reports right side headache and trouble swallowing x2 days. Patient also reports decreased taste.  Hx of COPD, CHF, atrial fibrillation. Former smoker EXAM: PORTABLE CHEST - 1 VIEW COMPARISON:  03/17/2019 FINDINGS: Persistent small  left pleural effusion with adjacent consolidation/atelectasis at the left lung base. No overt interstitial edema. No new infiltrate. Heart size upper limits normal for technique. Aortic Atherosclerosis (ICD10-170.0). No pneumothorax. Visualized bones unremarkable. IMPRESSION: Persistent small left pleural effusion with adjacent atelectasis/consolidation. No acute findings. Electronically Signed   By: Lucrezia Europe M.D.   On: 04/30/2019 10:31   Dg Duanne Limerick W Single Cm (sol Or Thin Ba)  Result Date: 05/01/2019 CLINICAL DATA:  Metastatic carcinoid tumor.  Dysphagia. EXAM: UPPER GI SERIES WITHOUT KUB TECHNIQUE: Routine upper GI series was performed with thin/high density/water soluble barium. FLUOROSCOPY TIME:  Fluoroscopy Time:  2 minutes 30 seconds Radiation Exposure Index (if provided by the fluoroscopic device): 29.9 mGy COMPARISON:  CT 04/30/2019, 07/18/2018. FINDINGS: Limited exam due to patient's condition. Esophagus is widely patent. No reflux. Stomach is widely patent without focal abnormality. Duodenum and C-loop appear normal. IV contrast from prior CT of 04/30/2019 noted in a hydronephrotic right kidney. Stable mild L3 compression fracture. Surgical clips in abdomen. IMPRESSION: 1. Normal upper GI. No obstructing abnormality identified. No reflux. 2. IV contrast from prior CT of 04/30/2019 noted in a hydronephrotic right kidney. Electronically Signed   By: Marcello Moores  Register   On: 05/01/2019 08:07   Vas [name] Lower Extremity Venous Reflux  Result Date: 04/15/2019  Lower Venous Reflux Study Indications: Pain.  Performing Technologist: Charlane Ferretti RT (R)(VS)  Examination Guidelines: A complete evaluation includes B-mode imaging, spectral Doppler, color Doppler, and power Doppler as needed of all accessible portions of each vessel. Bilateral testing is considered an integral part  of a complete examination. Limited examinations for reoccurring indications may be performed as noted. The reflux portion of the exam is performed with the patient in reverse Trendelenburg.  +---------+---------------+---------+-----------+----------+--------------+ RIGHT    CompressibilityPhasicitySpontaneityPropertiesThrombus Aging +---------+---------------+---------+-----------+----------+--------------+ CFV      Full                                                        +---------+---------------+---------+-----------+----------+--------------+ SFJ      Full                                                        +---------+---------------+---------+-----------+----------+--------------+ FV Prox  Full                                                        +---------+---------------+---------+-----------+----------+--------------+ FV Mid   Full                                                        +---------+---------------+---------+-----------+----------+--------------+ FV DistalFull                                                        +---------+---------------+---------+-----------+----------+--------------+  POP      Full                                                        +---------+---------------+---------+-----------+----------+--------------+ GSV      Full                                                        +---------+---------------+---------+-----------+----------+--------------+ SSV      Partial                                                     +---------+---------------+---------+-----------+----------+--------------+  +---------+---------------+---------+-----------+----------+--------------+ LEFT     CompressibilityPhasicitySpontaneityPropertiesThrombus Aging +---------+---------------+---------+-----------+----------+--------------+ CFV      Full                                                         +---------+---------------+---------+-----------+----------+--------------+ SFJ      Full                                                        +---------+---------------+---------+-----------+----------+--------------+ FV Prox  Full                                                        +---------+---------------+---------+-----------+----------+--------------+ FV Mid   Full                                                        +---------+---------------+---------+-----------+----------+--------------+ FV DistalFull                                                        +---------+---------------+---------+-----------+----------+--------------+ POP      Full                                                        +---------+---------------+---------+-----------+----------+--------------+ GSV      Full                                                        +---------+---------------+---------+-----------+----------+--------------+  SSV      None                                                        +---------+---------------+---------+-----------+----------+--------------+  Venous Reflux Times Normal value < 0.5 sec +---+---------+    Left (ms) +---+---------+ [id number]   +---+---------+ Summary: Right: There is no evidence of deep vein thrombosis in the lower extremity.There is no evidence of superficial venous thrombosis. Left: There is no evidence of deep vein thrombosis in the lower extremity.There is no evidence of superficial venous thrombosis.  *See table(s) above for measurements and observations. Electronically signed by Leotis Pain MD on 04/15/2019 at 4:58:18 PM.    Final        Today   Subjective:   Veronica Wang patient feeling better denies any complaints today Objective:   Blood pressure 111/70, pulse 97, temperature 98.1 F (36.7 C), temperature source Oral, resp. rate 20, height 4\' 9"  (1.448 m), weight 39 kg, SpO2 100 %.   .  Intake/Output Summary (Last 24 hours) at 05/04/2019 1021 Last data filed at 05/03/2019 1300 Gross per 24 hour  Intake 240 ml  Output -  Net 240 ml    Exam VITAL SIGNS: Blood pressure 111/70, pulse 97, temperature 98.1 F (36.7 C), temperature source Oral, resp. rate 20, height 4\' 9"  (1.448 m), weight 39 kg, SpO2 100 %.  GENERAL:  83 y.o.-year-old patient lying in the bed with no acute distress.  EYES: Pupils equal, round, reactive to light and accommodation. No scleral icterus. Extraocular muscles intact.  HEENT: Head atraumatic, normocephalic. Oropharynx and nasopharynx clear.  NECK:  Supple, no jugular venous distention. No thyroid enlargement, no tenderness.  LUNGS: Normal breath sounds bilaterally, no wheezing, rales,rhonchi or crepitation. No use of accessory muscles of respiration.  CARDIOVASCULAR: S1, S2 normal. No murmurs, rubs, or gallops.  ABDOMEN: Soft, nontender, nondistended. Bowel sounds present. No organomegaly or mass.  EXTREMITIES: No pedal edema, cyanosis, or clubbing.  NEUROLOGIC: Cranial nerves II through XII are intact. Muscle strength 5/5 in all extremities. Sensation intact. Gait not checked.  PSYCHIATRIC: The patient is alert and oriented x 3.  SKIN: No obvious rash, lesion, or ulcer.   Data Review     CBC w Diff:  Lab Results  Component Value Date   WBC 15.5 (H) 05/03/2019   HGB 13.8 05/03/2019   HGB 15.9 06/12/2014   HCT 44.1 05/03/2019   HCT 49.2 (H) 06/12/2014   PLT 315 05/03/2019   PLT 208 06/12/2014   LYMPHOPCT 11 05/03/2019   LYMPHOPCT 9.8 11/11/2013   BANDSPCT 1 12/14/2014   MONOPCT 4 05/03/2019   MONOPCT 8.0 11/11/2013   EOSPCT 1 05/03/2019   EOSPCT 0.1 11/11/2013   BASOPCT 1 05/03/2019   BASOPCT 0.1 11/11/2013   CMP:  Lab Results  Component Value Date   NA 147 (H) 05/02/2019   NA 142 06/12/2014   K 3.5 05/02/2019   K 4.1 06/12/2014   CL 106 05/02/2019   CL 109 (H) 06/12/2014   CO2 26 05/02/2019   CO2 28 06/12/2014   BUN  24 (H) 05/02/2019   BUN 15 06/12/2014   CREATININE 0.81 05/02/2019   CREATININE 0.86 06/12/2014   PROT 6.7 04/30/2019   PROT 7.5 06/12/2014   ALBUMIN 3.1 (L) 04/30/2019   ALBUMIN 3.6 06/12/2014  BILITOT 0.7 04/30/2019   BILITOT 0.4 06/12/2014   ALKPHOS 121 04/30/2019   ALKPHOS 101 06/12/2014   AST 30 04/30/2019   AST 32 06/12/2014   ALT 16 04/30/2019   ALT 23 06/12/2014  .  Micro Results Recent Results (from the past 240 hour(s))  SARS Coronavirus 2 by RT PCR (hospital order, performed in [facility] hospital lab) Nasopharyngeal Nasopharyngeal Swab     Status: None   Collection Time: 04/30/19 10:47 AM   Specimen: Nasopharyngeal Swab  Result Value Ref Range Status   SARS Coronavirus 2 NEGATIVE NEGATIVE Final    Comment: (NOTE) If result is NEGATIVE SARS-CoV-2 target nucleic acids are NOT DETECTED. The SARS-CoV-2 RNA is generally detectable in upper and lower  respiratory specimens during the acute phase of infection. The lowest  concentration of SARS-CoV-2 viral copies this assay can detect is 250  copies / mL. A negative result does not preclude SARS-CoV-2 infection  and should not be used as the sole basis for treatment or other  patient management decisions.  A negative result may occur with  improper specimen collection / handling, submission of specimen other  than nasopharyngeal swab, presence of viral mutation(s) within the  areas targeted by this assay, and inadequate number of viral copies  (<250 copies / mL). A negative result must be combined with clinical  observations, patient history, and epidemiological information. If result is POSITIVE SARS-CoV-2 target nucleic acids are DETECTED. The SARS-CoV-2 RNA is generally detectable in upper and lower  respiratory specimens dur ing the acute phase of infection.  Positive  results are indicative of active infection with SARS-CoV-2.  Clinical  correlation with patient history and other diagnostic information is   necessary to determine patient infection status.  Positive results do  not rule out bacterial infection or co-infection with other viruses. If result is PRESUMPTIVE POSTIVE SARS-CoV-2 nucleic acids MAY BE PRESENT.   A presumptive positive result was obtained on the submitted specimen  and confirmed on repeat testing.  While 2019 novel coronavirus  (SARS-CoV-2) nucleic acids may be present in the submitted sample  additional confirmatory testing may be necessary for epidemiological  and / or clinical management purposes  to differentiate between  SARS-CoV-2 and other Sarbecovirus currently known to infect humans.  If clinically indicated additional testing with an alternate test  methodology [phone]) is advised. The SARS-CoV-2 RNA is generally  detectable in upper and lower respiratory sp ecimens during the acute  phase of infection. The expected result is Negative. Fact Sheet for Patients:  StrictlyIdeas.no Fact Sheet for Healthcare Providers: BankingDealers.co.za This test is not yet approved or cleared by the [place] FDA and has been authorized for detection and/or diagnosis of SARS-CoV-2 by FDA under an Emergency Use Authorization (EUA).  This EUA will remain in effect (meaning this test can be used) for the duration of the COVID-19 declaration under Section 564(b)(1) of the Act, 21 U.S.C. section 360bbb-3(b)(1), unless the authorization is terminated or revoked sooner. Performed at [facility], [address] [place], [place] [address]      Code Status History    Date Active Date Inactive Code Status Order ID Comments User Context   04/30/2019 1646 05/03/2019 1940 DNR [id number]  Dustin Flock, MD ED   10/27/2018 1026 10/30/2018 1711 DNR [id number]  Bettey Costa, MD Inpatient   10/26/2018 2000 10/27/2018 1026 Full Code [id number]  Saundra Shelling, MD Inpatient   08/14/2018 0300 08/15/2018 1605 Full Code [id number]   Lance Coon, MD Inpatient  12/15/2014 1933 01/06/2015 1806 Full Code [id number]  Corey Harold Inpatient   12/05/2014 0340 12/15/2014 1933 Full Code [id number]  Juluis Mire, MD Inpatient   Advance Care Planning Activity    Questions for Most Recent Historical Code Status (Order [id number])    Question Answer Comment   In the event of cardiac or respiratory ARREST Do not call a "code blue"    In the event of cardiac or respiratory ARREST Do not perform Intubation, CPR, defibrillation or ACLS    In the event of cardiac or respiratory ARREST Use medication by any route, position, wound care, and other measures to relive pain and suffering. May use oxygen, suction and manual treatment of airway obstruction as needed for comfort.           Follow-up Information    Tama High III, MD Follow up in 6 day(s).   Specialty: Internal Medicine Contact information: [place] [address] [phone]           Discharge Medications   Allergies as of 05/03/2019      Reactions   No Known Allergies       Medication List    STOP taking these medications   cephALEXin 500 MG capsule Commonly known as: KEFLEX   dabigatran 75 MG Caps capsule Commonly known as: PRADAXA   furosemide 20 MG tablet Commonly known as: LASIX   metoprolol tartrate 25 MG tablet Commonly known as: LOPRESSOR   potassium chloride 10 MEQ tablet Commonly known as: KLOR-CON   sulfamethoxazole-trimethoprim 800-160 MG tablet Commonly known as: BACTRIM DS     TAKE these medications   acetaminophen 325 MG tablet Commonly known as: TYLENOL Take 2 tablets (650 mg total) by mouth every 6 (six) hours as needed for mild pain (or Fever >/= 101).   albuterol 108 (90 Base) MCG/ACT inhaler Commonly known as: VENTOLIN HFA Inhale 2 puffs into the lungs every 6 (six) hours as needed for wheezing or shortness of breath.   Centrum Silver tablet Take 1 tablet by mouth daily.   Eliquis 2.5  MG Tabs tablet Generic drug: apixaban 2.5 mg 2 (two) times daily.   feeding supplement Liqd Take 1 Container by mouth 3 (three) times daily between meals.   LANREOTIDE ACETATE [place] Inject into the skin.   lisinopril 5 MG tablet Commonly known as: ZESTRIL Take 5 mg by mouth daily.   loperamide 2 MG tablet Commonly known as: IMODIUM A-D Take 4 mg by mouth 4 (four) times daily as needed for diarrhea or loose stools.   ondansetron 8 MG tablet Commonly known as: ZOFRAN Take 1 tablet (8 mg total) by mouth every 8 (eight) hours as needed for nausea or vomiting.   pantoprazole 40 MG tablet Commonly known as: Protonix Take 1 tablet (40 mg total) by mouth daily.   phenazopyridine 100 MG tablet Commonly known as: PYRIDIUM Take 1 tablet (100 mg total) by mouth 2 (two) times daily as needed for pain (dysuria).   Telotristat Ethyl(as Etiprate) 250 MG Tabs Take 1 tablet by mouth 3 (three) times daily.   Vitamin B-12 CR 1000 MCG Tbcr Take 1,000 mcg by mouth daily.   Vitamin D-1000 Max St 25 MCG (1000 UT) tablet Generic drug: Cholecalciferol Take 1,000 Units by mouth daily.          Total Time in preparing paper work, data evaluation and todays exam - 48 minutes  Dustin Flock M.D on 05/04/2019 at 10:21 AM [employer]  Office  [phone]

## 2019-05-06 ENCOUNTER — Telehealth: Payer: Self-pay | Admitting: Primary Care

## 2019-05-06 ENCOUNTER — Emergency Department
Admission: EM | Admit: 2019-05-06 | Discharge: 2019-05-06 | Disposition: A | Discharge status: Home / Self Care | Payer: Medicare Other | Attending: Emergency Medicine | Admitting: Emergency Medicine

## 2019-05-06 ENCOUNTER — Emergency Department: Payer: Medicare Other

## 2019-05-06 ENCOUNTER — Other Ambulatory Visit: Payer: Self-pay

## 2019-05-06 ENCOUNTER — Encounter: Payer: Self-pay | Admitting: Emergency Medicine

## 2019-05-06 ENCOUNTER — Telehealth (INDEPENDENT_AMBULATORY_CARE_PROVIDER_SITE_OTHER): Payer: Self-pay | Admitting: Vascular Surgery

## 2019-05-06 DIAGNOSIS — Y999 Unspecified external cause status: Secondary | ICD-10-CM | POA: Diagnosis not present

## 2019-05-06 DIAGNOSIS — W19XXXA Unspecified fall, initial encounter: Secondary | ICD-10-CM

## 2019-05-06 DIAGNOSIS — M25561 Pain in right knee: Secondary | ICD-10-CM | POA: Insufficient documentation

## 2019-05-06 DIAGNOSIS — M25552 Pain in left hip: Secondary | ICD-10-CM | POA: Diagnosis not present

## 2019-05-06 DIAGNOSIS — M542 Cervicalgia: Secondary | ICD-10-CM | POA: Insufficient documentation

## 2019-05-06 DIAGNOSIS — Z8673 Personal history of transient ischemic attack (TIA), and cerebral infarction without residual deficits: Secondary | ICD-10-CM | POA: Diagnosis not present

## 2019-05-06 DIAGNOSIS — S61412A Laceration without foreign body of left hand, initial encounter: Secondary | ICD-10-CM | POA: Insufficient documentation

## 2019-05-06 DIAGNOSIS — Y9301 Activity, walking, marching and hiking: Secondary | ICD-10-CM | POA: Diagnosis not present

## 2019-05-06 DIAGNOSIS — Z8505 Personal history of malignant neoplasm of liver: Secondary | ICD-10-CM | POA: Insufficient documentation

## 2019-05-06 DIAGNOSIS — S0181XA Laceration without foreign body of other part of head, initial encounter: Secondary | ICD-10-CM | POA: Insufficient documentation

## 2019-05-06 DIAGNOSIS — S61411A Laceration without foreign body of right hand, initial encounter: Secondary | ICD-10-CM | POA: Diagnosis not present

## 2019-05-06 DIAGNOSIS — T07XXXA Unspecified multiple injuries, initial encounter: Secondary | ICD-10-CM | POA: Diagnosis not present

## 2019-05-06 DIAGNOSIS — R519 Headache, unspecified: Secondary | ICD-10-CM | POA: Diagnosis not present

## 2019-05-06 DIAGNOSIS — Y929 Unspecified place or not applicable: Secondary | ICD-10-CM | POA: Diagnosis not present

## 2019-05-06 DIAGNOSIS — T148XXA Other injury of unspecified body region, initial encounter: Secondary | ICD-10-CM

## 2019-05-06 DIAGNOSIS — W101XXA Fall (on)(from) sidewalk curb, initial encounter: Secondary | ICD-10-CM | POA: Diagnosis not present

## 2019-05-06 MED ORDER — HYDROCODONE-ACETAMINOPHEN 5-325 MG PO TABS: 1.0000 | ORAL_TABLET | ORAL | 0 refills | Status: DC | PRN

## 2019-05-06 MED ORDER — CEPHALEXIN 500 MG PO CAPS: 1000.0000 mg | ORAL_CAPSULE | Freq: Two times a day (BID) | ORAL | 0 refills | Status: DC

## 2019-05-06 NOTE — ED Triage Notes (Signed)
Brought in via EMS  States she fell yesterday  hitting her face  No LOC bruising noted to left side of face  Pain to left hip and right knee pain

## 2019-05-06 NOTE — Telephone Encounter (Signed)
Spoke with patient's daughter Judeen Hammans, regarding Palliative services and she was in agreement with this.  I have scheduled a Telephone Consult for 05/13/19 @ 3 PM.

## 2019-05-06 NOTE — Telephone Encounter (Signed)
Patient will be schedule

## 2019-05-06 NOTE — Telephone Encounter (Signed)
If she's having new blisters and draining, she can come in for unna wraps

## 2019-05-06 NOTE — ED Provider Notes (Signed)
[facility] Emergency Department Provider Note  ____________________________________________  Time seen: Approximately 3:46 PM  I have reviewed the triage vital signs and the nursing notes.   HISTORY  Chief Complaint Fall    HPI Veronica Wang is a 83 y.o. female who presents the emergency department for evaluation of a fall that occurred yesterday.  Patient sustained a mechanical fall off the edge of driveway/sidewalk.  Patient tripped, fell striking her head on the ground.  The patient's daughter was with her but was walking around the car to assist her the patient tripped and fell.  Patient did not lose consciousness.  She sustained multiple skin tears to the face, hands, bilateral lower extremities.  Initially, patient states that other than a skin tear she felt "okay.  Today everything "hurts."  Patient has had no loss of consciousness at any time.  She denies any headache, neck pain, chest pain, shortness of breath abdominal pain, nausea vomiting.  Patient has CHF and does have bilateral lower extremity edema that is being addressed by primary care and no change from baseline.  Patient has had a "weeping" from the skin tears of her lower extremity due to her edema.  No medications prior to arrival.  Patient has a significant medical history as described below.  At this time, no complaints with chronic medical problems.         Past Medical History:  Diagnosis Date  . Abnormal MRI 09/10/2015  . Atrial fibrillation ([place])   . Cancer ([place])    Liver  . 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 Carcinoid tumor of intestine   . CHF (congestive heart failure) ([place])   . COPD (chronic obstructive pulmonary disease) ([place])   . FH: chemotherapy   . GERD (gastroesophageal reflux disease)   . Hyperlipidemia   . Hypertension   . Macular degeneration   . Polycythemia   . Radiation     Patient Active Problem List   Diagnosis Date Noted  . Aspiration pneumonia ([place])  04/30/2019  . Lower limb ulcer, calf ([place]) 03/25/2019  . Swelling of limb 03/25/2019  . Gross hematuria 03/23/2019  . Acute exacerbation of CHF (congestive heart failure) ([place]) 10/26/2018  . Protein-calorie malnutrition, severe 08/14/2018  . Stroke ([place]) 08/13/2018  . HTN (hypertension) 08/13/2018  . HLD (hyperlipidemia) 08/13/2018  . Chronic combined systolic (congestive) and diastolic (congestive) heart failure ([place]) 11/27/2017  . PAF (paroxysmal atrial fibrillation) ([place]) 11/27/2017  . Abnormal MRI 09/10/2015  . Malnutrition of moderate degree ([place]) 12/06/2014  . Pneumonia 12/05/2014  . COPD (chronic obstructive pulmonary disease) ([place]) 12/05/2014  . 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 CATARACT EXTRACTION  splenectomy  . COLON RESECTION    . COLON SURGERY    . GASTRECTOMY    . SPLENECTOMY      Prior to Admission medications   Medication Sig Start Date End Date Taking? Authorizing Provider  acetaminophen (TYLENOL) 325 MG tablet Take 2 tablets (650 mg total) by mouth every 6 (six) hours as needed for mild pain (or Fever >/= 101). 12/15/14   Tama High III, MD  albuterol (PROVENTIL HFA;VENTOLIN HFA) 108 (90 BASE) MCG/ACT inhaler Inhale 2 puffs into the lungs every 6 (six) hours as needed for wheezing or shortness of breath.    [provider]  cephALEXin (KEFLEX) 500 MG capsule Take 2 capsules (1,000 mg total) by mouth 2 (two) times daily. 05/06/19  Aubriella Perezgarcia, Charline Bills, PA-C  Cholecalciferol (VITAMIN D-1000 MAX ST) 1000 UNITS tablet Take 1,000 Units by mouth daily.     [provider]  Cyanocobalamin (VITAMIN B-12 CR) 1000 MCG TBCR Take 1,000 mcg by mouth daily.     [provider]  ELIQUIS 2.5 MG TABS tablet 2.5 mg 2 (two) times daily.  04/09/19   [provider]  feeding supplement (BOOST HIGH PROTEIN) LIQD Take 1 Container by mouth 3 (three) times daily between meals.    [provider]  HYDROcodone-acetaminophen (NORCO/VICODIN) 5-325 MG tablet Take 1 tablet by mouth every 4 (four) hours as needed for moderate pain. 05/06/19   Jacquelinne Speak, Charline Bills, PA-C  LANREOTIDE ACETATE [place] Inject into the skin.    [provider]  lisinopril (PRINIVIL,ZESTRIL) 5 MG tablet Take 5 mg by mouth daily. 06/18/18   [provider]  loperamide (IMODIUM A-D) 2 MG tablet Take 4 mg by mouth 4 (four) times daily as needed for diarrhea or loose stools.    [provider]  Multiple Vitamins-Minerals (CENTRUM SILVER) tablet Take 1 tablet by mouth daily.  03/02/11   [provider]  ondansetron (ZOFRAN) 8 MG tablet Take 1 tablet (8 mg total) by mouth every 8 (eight) hours as needed for nausea or vomiting. 09/17/17   Jacquelin Hawking, NP  pantoprazole (PROTONIX) 40 MG tablet Take 1 tablet (40 mg total) by mouth daily. 05/03/19 05/02/20  Dustin Flock, MD  phenazopyridine (PYRIDIUM) 100 MG tablet Take 1 tablet (100 mg total) by mouth 2 (two) times daily as needed for pain (dysuria). 03/17/19 03/16/20  Delman Kitten, MD  Telotristat Etiprate 250 MG TABS Take 1 tablet by mouth 3 (three) times daily. 05/27/18   [provider]    Allergies No known allergies  Family History  Problem Relation Age of Onset  . Heart attack Sister   . Heart attack Brother   . Leukemia Mother     Social History Social History   Tobacco Use  . Smoking status: Former Smoker    Types: Cigarettes  . Smokeless tobacco: Never Used  Substance Use Topics  . Alcohol use: No    Alcohol/week: 0.0 standard drinks  . Drug use: No     Review of Systems  Constitutional: No fever/chills Eyes: No visual changes. No discharge ENT: No upper respiratory complaints. Cardiovascular: no chest pain. Respiratory: no cough. No SOB. Gastrointestinal: No abdominal pain.  No nausea, no vomiting.  No diarrhea.  No constipation. Genitourinary: Negative for dysuria. No  hematuria Musculoskeletal: Positive for pain to the knee and the hip. Skin: Multiple skin tears to the face, hands, bilateral lower extremities Neurological: Negative for headaches, focal weakness or numbness. 10-point ROS otherwise negative.  ____________________________________________   PHYSICAL EXAM:  VITAL SIGNS: ED Triage Vitals [05/06/19 1421]  Enc Vitals Group     BP 106/61     Pulse Rate (!) 110     Resp 20     Temp 98 F (36.7 C)     Temp Source Oral     SpO2 93 %     Weight 85 lb 15.7 oz (39 kg)     Height      Head Circumference      Peak Flow      Pain Score 6     Pain Loc      Pain Edu?      Excl. in [place]?      Constitutional: Alert and oriented. Well appearing and  in no acute distress. Eyes: Conjunctivae are normal. PERRL. EOMI. Head: Significant ecchymosis to the left forehead, left cheek region.  Patient has multiple skin tears to the left side of face both over the forehead and the cheek.  No active bleeding.  No foreign body.  Patient is tender to palpation over ecchymotic regions but not over the underlying osseous structures.  No other tenderness to palpation of the osseous structures of the skull and face.  No crepitus.  No battle signs, no serosanguineous fluid drainage from the ears or nares.  Patient does have periorbital ecchymosis to the left eye but none to the right. ENT:      Ears:       Nose: No congestion/rhinnorhea.      Mouth/Throat: Mucous membranes are moist.  Neck: No stridor.  No cervical spine tenderness to palpation.  Cardiovascular: Normal rate, regular rhythm. Normal S1 and S2.  Good peripheral circulation.  Bilateral lower extremity edema.  Chronic according to patient and daughter. Respiratory: Normal respiratory effort without tachypnea or retractions. Lungs CTAB. Good air entry to the bases with no decreased or absent breath sounds. Musculoskeletal: Full range of motion to all extremities. No gross deformities appreciated.  Patient  is diffusely tender to palpation left hip, with no point specific tenderness and no palpable abnormality.  Patient has limited range of motion due to weakness but good passive range of motion after imaging.  Examination of bilateral lower extremities reveals edema bilaterally.  Pulses still intact.  Sensation intact.  Patient has skin tears to both lower extremities.  No active bleeding.  No visible foreign body. Neurologic:  Normal speech and language. No gross focal neurologic deficits are appreciated.  Skin:  Skin is warm, dry and intact. No rash noted. Psychiatric: Mood and affect are normal. Speech and behavior are normal. Patient exhibits appropriate insight and judgement.   ____________________________________________   LABS (all labs ordered are listed, but only abnormal results are displayed)  Labs Reviewed - No data to display ____________________________________________  EKG   ____________________________________________  RADIOLOGY I personally viewed and evaluated these images as part of my medical decision making, as well as reviewing the written report by the radiologist.  Ct Head Wo Contrast  Result Date: 05/06/2019 CLINICAL DATA:  Fall, facial pain.  Anticoagulation. EXAM: CT HEAD WITHOUT CONTRAST TECHNIQUE: Contiguous axial images were obtained from the base of the skull through the vertex without intravenous contrast. COMPARISON:  04/30/2019 FINDINGS: Brain: Evolving encephalomalacia within the left parietooccipital lobe at site of prior infarct. No evidence of an acute infarction, hemorrhage, extra-axial collection, or mass lesion/mass effect. Scattered low-density changes within the periventricular and subcortical white matter compatible with chronic microvascular ischemic change. Moderate diffuse cerebral volume loss. Vascular: Mild atherosclerotic calcifications involving the large vessels of the skull base. No unexpected hyperdense vessel. Skull: Normal. Negative for  fracture or focal lesion. Sinuses/Orbits: Small amount of fluid in the left sphenoid sinus, decreased from prior. Remaining paranasal sinuses and mastoid air cells are clear. Orbital structures intact. Other: None. IMPRESSION: 1.  No acute intracranial findings. 2.  Continued evolution of left parieto-occipital infarct. 3.  Chronic microvascular ischemic change and cerebral volume loss. Electronically Signed   By: Davina Poke M.D.   On: 05/06/2019 15:34   Ct Cervical Spine Wo Contrast  Result Date: 05/06/2019 CLINICAL DATA:  Neck pain after fall EXAM: CT CERVICAL SPINE WITHOUT CONTRAST TECHNIQUE: Multidetector CT imaging of the cervical spine was performed without intravenous contrast. Multiplanar CT image reconstructions  were also generated. COMPARISON:  08/13/2018, 09/20/2015 FINDINGS: Alignment: Exaggerated cervical lordosis, which may be positional. No static listhesis. Skull base and vertebrae: Nondisplaced fracture of the proximal right first rib at the costovertebral junction (series 6, image 59), new from prior. The cervical vertebrae are intact without fracture. No bone lesion. Soft tissues and spinal canal: No prevertebral fluid or swelling. No visible canal hematoma. Disc levels: Intervertebral disc heights are relatively preserved. Multilevel facet and uncovertebral arthropathy. No evidence of high-grade foraminal or canal stenosis by CT. Upper chest: Emphysematous changes of the lung apices with chronic biapical pleuroparenchymal scarring. Other: None. IMPRESSION: 1. Nondisplaced fracture of the posterior right first rib at the costovertebral junction. 2. No acute cervical spine fracture or posttraumatic subluxation. 3. Emphysematous changes of the lung apices with chronic biapical pleuroparenchymal scarring. Electronically Signed   By: Davina Poke M.D.   On: 05/06/2019 15:43   Dg Knee Complete 4 Views Right  Result Date: 05/06/2019 CLINICAL DATA:  Right knee pain secondary to a fall  yesterday. EXAM: RIGHT KNEE - COMPLETE 4+ VIEW COMPARISON:  None. FINDINGS: No evidence of fracture, dislocation, or joint effusion. No evidence of arthropathy or other focal bone abnormality. Soft tissues are unremarkable. Osteopenia. IMPRESSION: No acute abnormalities. Electronically Signed   By: Lorriane Shire M.D.   On: 05/06/2019 15:45   Dg Hip Unilat W Or Wo Pelvis 2-3 Views Left  Result Date: 05/06/2019 CLINICAL DATA:  Left hip pain secondary to a fall yesterday. EXAM: DG HIP (WITH OR WITHOUT PELVIS) 2-3V LEFT COMPARISON:  None. FINDINGS: There is no evidence of hip fracture or dislocation. There is medial joint space narrowing of both hips. Osteopenia. Multiple surgical clips and staples in the pelvis. IMPRESSION: No acute abnormalities. Electronically Signed   By: Lorriane Shire M.D.   On: 05/06/2019 15:44    ____________________________________________    PROCEDURES  Procedure(s) performed:    Procedures    Medications - No data to display   ____________________________________________   INITIAL IMPRESSION / ASSESSMENT AND PLAN / ED COURSE  Pertinent labs & imaging results that were available during my care of the patient were reviewed by me and considered in my medical decision making (see chart for details).  Review of the [place] CSRS was performed in accordance of the [place] prior to dispensing any controlled drugs.           Patient's diagnosis is consistent with fall, multiple contusions, multiple skin tears.  Patient presented to emergency department after mechanical fall yesterday.  Imaging was reassuring with no acute osseous findings.  Patient was complaining of generalized soreness from her fall yesterday.  Patient does have chronic bilateral lower extremity edema that is being followed by her primary care.  No change since injury yesterday.  Patient has multiple skin tears.  I will place the patient on antibiotics prophylactically given the number of skin tears.   Patient will be placed on limited pain medication for pain.  I discussed side effects with both the patient and her daughter to include dizziness, disorientation, weakness, drowsiness.  Patient is to use pain medication very cautiously to prevent further injuries while on pain medicine.  Follow-up with primary care.  Patient already has close follow-up care in 2 days.. Patient is given ED precautions to return to the ED for any worsening or new symptoms.     ____________________________________________  FINAL CLINICAL IMPRESSION(S) / ED DIAGNOSES  Final diagnoses:  Fall, initial encounter  Multiple contusions  Multiple skin tears  NEW MEDICATIONS STARTED DURING THIS VISIT:  ED Discharge Orders         Ordered    cephALEXin (KEFLEX) 500 MG capsule  2 times daily     05/06/19 1647    HYDROcodone-acetaminophen (NORCO/VICODIN) 5-325 MG tablet  Every 4 hours PRN     05/06/19 1647              This chart was dictated using voice recognition software/Dragon. Despite best efforts to proofread, errors can occur which can change the meaning. Any change was purely unintentional.    Darletta Moll, PA-C 05/06/19 1647    Nena Polio, MD 05/06/19 2053

## 2019-05-09 ENCOUNTER — Encounter (INDEPENDENT_AMBULATORY_CARE_PROVIDER_SITE_OTHER): Payer: Medicare Other

## 2019-05-12 ENCOUNTER — Telehealth: Payer: Self-pay | Admitting: Primary Care

## 2019-05-12 NOTE — Telephone Encounter (Signed)
Returned called to daughter regarding Palliative Consult scheduled for 11/3 @ 3 PM, no answer left message on her cell as well as her home number.

## 2019-05-13 ENCOUNTER — Encounter (INDEPENDENT_AMBULATORY_CARE_PROVIDER_SITE_OTHER): Payer: Self-pay

## 2019-05-13 ENCOUNTER — Other Ambulatory Visit: Payer: Medicare Other | Admitting: Primary Care

## 2019-05-13 ENCOUNTER — Ambulatory Visit (INDEPENDENT_AMBULATORY_CARE_PROVIDER_SITE_OTHER): Payer: Medicare Other | Admitting: Nurse Practitioner

## 2019-05-13 ENCOUNTER — Other Ambulatory Visit: Payer: Self-pay

## 2019-05-13 VITALS — BP 116/67 | HR 80 | Resp 15

## 2019-05-13 DIAGNOSIS — L97201 Non-pressure chronic ulcer of unspecified calf limited to breakdown of skin: Secondary | ICD-10-CM

## 2019-05-13 DIAGNOSIS — Z515 Encounter for palliative care: Secondary | ICD-10-CM

## 2019-05-13 NOTE — Progress Notes (Signed)
History of Present Illness  There is no documented history at this time  Assessments & Plan   There are no diagnoses linked to this encounter.    Additional instructions  Subjective:  Patient presents with venous ulcer of the Bilateral lower extremity.    Procedure:  3 layer unna wrap was placed Bilateral lower extremity.   Plan:   Follow up in one week.  

## 2019-05-13 NOTE — Progress Notes (Signed)
[facility] Consult Note Telephone: [phone]  Fax: [phone]  TELEHEALTH VISIT STATEMENT Due to the COVID-19 crisis, this visit was done via telemedicine from my office. It was initiated and consented to by this patient and/or family.  PATIENT NAME: Veronica Wang [id number] (home)  DOB: 1936-05-16 MRN: [id number]  PRIMARY CARE PROVIDER:   Adin Hector, MD, [facility] [place] [address] [phone]  REFERRING PROVIDER:  Adin Hector, [facility] [place],  [place] [address] [phone]  RESPONSIBLE PARTY:   Extended Emergency Contact Information Primary Emergency Contact: Spencer,Sherry B Address: [address] [place], [place] [address] [place] Phone: [phone] Work Phone: [phone] Mobile Phone: [phone] Relation: Daughter  I met telephonically with Judeen Hammans, daughter with patient present. We discussed their current needs and goals of care.   ASSESSMENT AND RECOMMENDATIONS:   1. Advance Care Planning/Goals of Care: Goals include to maximize quality of life and symptom management. I spoke with daughter who is caregiver although patient lives in independent apartment. Daughter Judeen Hammans is looking for caregiving options. She hopes  palliative care can help, likely confusing with hospice services. We discussed goals of care for them, and the eligibility for hospice program vs access to specialty NP in palliative medicine.. 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 Judeen Hammans stated they are pursuing treatment with these medications currently. She needs help with adls and I adls. We discussed options for caregiving; daughter states that her mother does not qualify for medicaid financially.  We did not discuss  documents, but I  Can mail them MOST forms to discuss and we'll record next visit, about 4 weeks.  2. Symptom Management:   Falls: Daughter describes several falls, the last one being recently whereby she hit her head and had MRI. She is recovering, but daughter fears more falling. We discussed a call system so that she could have a call button on her person. There are pulls in her apartment but they are not always proximal to a fall. She uses a can and has a walker as well for stability. She must ride by w/c long distances.   Caregiving: Daughter is an only child with her own family, feeling the weight of care giving. We discussed adult day programs and other care management programs such as PACE . These may be on hiatus due to covid however. We discussed [facility] or [facility] but daughter states she would not meet income requirements , having retired from [employer]. We discussed hiring privately or bartering for care in her mother's home. She has also have her in her own home on occasion. We briefly discussed P[facility], and I gave her [facility]'s contact number  for more information. I will also ask SW Margaretmary Lombard to reach out in case there are other resources.   3. Family /Caregiver/Community Supports: Has only child with her own family, lives in renovated loft apts  In [place].  Sees several MDs, pCP and one  For LE wraps and wounds due to edema shear.   4. Cognitive / Functional decline:  Daughter reports some personality change after a stroke event. She lives in an independent apartment but daughter has some concerns about her ability to be living (I) now.  5. Follow up Palliative Care Visit: Palliative care will continue to follow for goals of care clarification and symptom management. Daughter will make some calls to these agencies suggested. Return 4 weeks or prn.  I spent 45 minutes providing this consultation,  from 1500 to 1545. More than 50% of the time in this consultation was spent coordinating  communication.   HISTORY OF PRESENT ILLNESS:  Veronica Wang is a 83 y.o. year old female with multiple medical problems including CAD, COPD, carcinoid tumors,. Palliative Care was asked to follow this patient by consultation request of Adin Hector, MD to help address advance care planning and goals of care. This is a follow up visit.  CODE STATUS: TBD  PPS: 50% HOSPICE ELIGIBILITY/DIAGNOSIS: no  PAST MEDICAL HISTORY:  Past Medical History:  Diagnosis Date   Abnormal MRI 09/10/2015   Atrial fibrillation (HCC)    Cancer (HCC)    Liver   Cancer (HCC)    stomach   Carcinoid tumor of intestine    CHF (congestive heart failure) (HCC)    COPD (chronic obstructive pulmonary disease) (HCC)    FH: chemotherapy    GERD (gastroesophageal reflux disease)    Hyperlipidemia    Hypertension    Macular degeneration    Polycythemia    Radiation     SOCIAL HX:  Social History   Tobacco Use   Smoking status: Former Smoker    Types: Cigarettes   Smokeless tobacco: Never Used  Substance Use Topics   Alcohol use: No    Alcohol/week: 0.0 standard drinks    ALLERGIES:  Allergies  Allergen Reactions   No Known Allergies      PERTINENT MEDICATIONS:  Outpatient Encounter Medications as of 05/13/2019  Medication Sig   acetaminophen (TYLENOL) 325 MG tablet Take 2 tablets (650 mg total) by mouth every 6 (six) hours as needed for mild pain (or Fever >/= 101).   albuterol (PROVENTIL HFA;VENTOLIN HFA) 108 (90 BASE) MCG/ACT inhaler Inhale 2 puffs into the lungs every 6 (six) hours as needed for wheezing or shortness of breath.   cephALEXin (KEFLEX) 500 MG capsule Take 2 capsules (1,000 mg total) by mouth 2 (two) times daily.   Cholecalciferol (VITAMIN D-1000 MAX ST) 1000 UNITS tablet Take 1,000 Units by mouth daily.    Cyanocobalamin (VITAMIN B-12 CR) 1000 MCG TBCR Take 1,000 mcg by mouth daily.    ELIQUIS 2.5 MG TABS tablet 2.5 mg 2 (two) times daily.    feeding  supplement (BOOST HIGH PROTEIN) LIQD Take 1 Container by mouth 3 (three) times daily between meals.   HYDROcodone-acetaminophen (NORCO/VICODIN) 5-325 MG tablet Take 1 tablet by mouth every 4 (four) hours as needed for moderate pain.   LANREOTIDE ACETATE [place] Inject into the skin.   lisinopril (PRINIVIL,ZESTRIL) 5 MG tablet Take 5 mg by mouth daily.   loperamide (IMODIUM A-D) 2 MG tablet Take 4 mg by mouth 4 (four) times daily as needed for diarrhea or loose stools.   Multiple Vitamins-Minerals (CENTRUM SILVER) tablet Take 1 tablet by mouth daily.    ondansetron (ZOFRAN) 8 MG tablet Take 1 tablet (8 mg total) by mouth every 8 (eight) hours as needed for nausea or vomiting.   pantoprazole (PROTONIX) 40 MG tablet Take 1 tablet (40 mg total) by mouth daily.   phenazopyridine (PYRIDIUM) 100 MG tablet Take 1 tablet (100 mg total) by mouth 2 (two) times daily as needed for pain (dysuria).   Telotristat Etiprate 250 MG TABS Take 1 tablet  by mouth 3 (three) times daily.   Facility-Administered Encounter Medications as of 05/13/2019  Medication   0.9 %  sodium chloride infusion    PHYSICAL EXAM / ROS:   Current and past weights: 85 lb General: NAD, frail, thin Cardiovascular: no chest pain reported, no edema reported  Pulmonary: no cough, no increased SOB Abdomen: appetite good , endorses constipation, continent of bowel GU: denies dysuria, continent of urine MSK:  no joint deformities, ambulatory, frequent falls Skin: no rashes or wounds reported Neurological: Weakness,h/o TBI,  mild cognitive impairment.  Cyndia Skeeters DNP AGPCNP-BC

## 2019-05-20 ENCOUNTER — Other Ambulatory Visit (INDEPENDENT_AMBULATORY_CARE_PROVIDER_SITE_OTHER): Payer: Self-pay | Admitting: Nurse Practitioner

## 2019-05-20 ENCOUNTER — Ambulatory Visit (INDEPENDENT_AMBULATORY_CARE_PROVIDER_SITE_OTHER): Payer: Medicare Other | Admitting: Nurse Practitioner

## 2019-05-20 ENCOUNTER — Encounter (INDEPENDENT_AMBULATORY_CARE_PROVIDER_SITE_OTHER): Payer: Self-pay

## 2019-05-20 ENCOUNTER — Encounter (INDEPENDENT_AMBULATORY_CARE_PROVIDER_SITE_OTHER): Payer: Medicare Other

## 2019-05-20 ENCOUNTER — Other Ambulatory Visit: Payer: Self-pay

## 2019-05-20 VITALS — BP 103/72 | HR 70 | Resp 17 | Ht 59.0 in | Wt <= 1120 oz

## 2019-05-20 DIAGNOSIS — L97201 Non-pressure chronic ulcer of unspecified calf limited to breakdown of skin: Secondary | ICD-10-CM | POA: Diagnosis not present

## 2019-05-20 NOTE — Progress Notes (Signed)
History of Present Illness  There is no documented history at this time  Assessments & Plan   There are no diagnoses linked to this encounter.    Additional instructions  Subjective:  Patient presents with venous ulcer of the Bilateral lower extremity.    Procedure:  3 layer unna wrap was placed Bilateral lower extremity.   Plan:   Follow up in one week.  

## 2019-05-23 ENCOUNTER — Ambulatory Visit: Payer: Medicare Other | Admitting: Physician Assistant

## 2019-05-23 LAB — AEROBIC CULTURE

## 2019-05-27 ENCOUNTER — Emergency Department: Payer: Medicare Other

## 2019-05-27 ENCOUNTER — Encounter: Payer: Self-pay | Admitting: Emergency Medicine

## 2019-05-27 ENCOUNTER — Encounter (INDEPENDENT_AMBULATORY_CARE_PROVIDER_SITE_OTHER): Payer: Medicare Other

## 2019-05-27 ENCOUNTER — Inpatient Hospital Stay
Admission: EM | Admit: 2019-05-27 | Discharge: 2019-06-10 | DRG: 871 | Disposition: E | Payer: Medicare Other | Attending: Internal Medicine | Admitting: Internal Medicine

## 2019-05-27 ENCOUNTER — Other Ambulatory Visit: Payer: Self-pay

## 2019-05-27 DIAGNOSIS — Z9081 Acquired absence of spleen: Secondary | ICD-10-CM

## 2019-05-27 DIAGNOSIS — J449 Chronic obstructive pulmonary disease, unspecified: Secondary | ICD-10-CM | POA: Diagnosis present

## 2019-05-27 DIAGNOSIS — L89152 Pressure ulcer of sacral region, stage 2: Secondary | ICD-10-CM | POA: Diagnosis present

## 2019-05-27 DIAGNOSIS — Z79899 Other long term (current) drug therapy: Secondary | ICD-10-CM

## 2019-05-27 DIAGNOSIS — S61512A Laceration without foreign body of left wrist, initial encounter: Secondary | ICD-10-CM | POA: Diagnosis present

## 2019-05-27 DIAGNOSIS — Z681 Body mass index (BMI) 19 or less, adult: Secondary | ICD-10-CM

## 2019-05-27 DIAGNOSIS — Z20828 Contact with and (suspected) exposure to other viral communicable diseases: Secondary | ICD-10-CM | POA: Diagnosis present

## 2019-05-27 DIAGNOSIS — R64 Cachexia: Secondary | ICD-10-CM | POA: Diagnosis present

## 2019-05-27 DIAGNOSIS — K219 Gastro-esophageal reflux disease without esophagitis: Secondary | ICD-10-CM | POA: Diagnosis present

## 2019-05-27 DIAGNOSIS — L97219 Non-pressure chronic ulcer of right calf with unspecified severity: Secondary | ICD-10-CM | POA: Diagnosis present

## 2019-05-27 DIAGNOSIS — E872 Acidosis, unspecified: Secondary | ICD-10-CM

## 2019-05-27 DIAGNOSIS — R0602 Shortness of breath: Secondary | ICD-10-CM

## 2019-05-27 DIAGNOSIS — I5022 Chronic systolic (congestive) heart failure: Secondary | ICD-10-CM | POA: Diagnosis present

## 2019-05-27 DIAGNOSIS — S81011A Laceration without foreign body, right knee, initial encounter: Secondary | ICD-10-CM | POA: Diagnosis present

## 2019-05-27 DIAGNOSIS — A4159 Other Gram-negative sepsis: Secondary | ICD-10-CM | POA: Diagnosis not present

## 2019-05-27 DIAGNOSIS — R54 Age-related physical debility: Secondary | ICD-10-CM | POA: Diagnosis present

## 2019-05-27 DIAGNOSIS — I10 Essential (primary) hypertension: Secondary | ICD-10-CM | POA: Diagnosis present

## 2019-05-27 DIAGNOSIS — C7B09 Secondary carcinoid tumors of other sites: Secondary | ICD-10-CM | POA: Diagnosis present

## 2019-05-27 DIAGNOSIS — Z9049 Acquired absence of other specified parts of digestive tract: Secondary | ICD-10-CM

## 2019-05-27 DIAGNOSIS — I110 Hypertensive heart disease with heart failure: Secondary | ICD-10-CM | POA: Diagnosis present

## 2019-05-27 DIAGNOSIS — L89616 Pressure-induced deep tissue damage of right heel: Secondary | ICD-10-CM | POA: Diagnosis present

## 2019-05-27 DIAGNOSIS — S065X9A Traumatic subdural hemorrhage with loss of consciousness of unspecified duration, initial encounter: Secondary | ICD-10-CM | POA: Diagnosis present

## 2019-05-27 DIAGNOSIS — I4891 Unspecified atrial fibrillation: Secondary | ICD-10-CM | POA: Diagnosis present

## 2019-05-27 DIAGNOSIS — R6510 Systemic inflammatory response syndrome (SIRS) of non-infectious origin without acute organ dysfunction: Secondary | ICD-10-CM | POA: Diagnosis present

## 2019-05-27 DIAGNOSIS — Z8249 Family history of ischemic heart disease and other diseases of the circulatory system: Secondary | ICD-10-CM

## 2019-05-27 DIAGNOSIS — L8990 Pressure ulcer of unspecified site, unspecified stage: Secondary | ICD-10-CM | POA: Insufficient documentation

## 2019-05-27 DIAGNOSIS — Y92009 Unspecified place in unspecified non-institutional (private) residence as the place of occurrence of the external cause: Secondary | ICD-10-CM

## 2019-05-27 DIAGNOSIS — Z8505 Personal history of malignant neoplasm of liver: Secondary | ICD-10-CM

## 2019-05-27 DIAGNOSIS — S065XAA Traumatic subdural hemorrhage with loss of consciousness status unknown, initial encounter: Secondary | ICD-10-CM | POA: Diagnosis present

## 2019-05-27 DIAGNOSIS — E340 Carcinoid syndrome: Secondary | ICD-10-CM | POA: Diagnosis present

## 2019-05-27 DIAGNOSIS — Z7901 Long term (current) use of anticoagulants: Secondary | ICD-10-CM

## 2019-05-27 DIAGNOSIS — W19XXXA Unspecified fall, initial encounter: Secondary | ICD-10-CM | POA: Diagnosis present

## 2019-05-27 DIAGNOSIS — Z87891 Personal history of nicotine dependence: Secondary | ICD-10-CM

## 2019-05-27 DIAGNOSIS — L89626 Pressure-induced deep tissue damage of left heel: Secondary | ICD-10-CM | POA: Diagnosis present

## 2019-05-27 DIAGNOSIS — D3A098 Benign carcinoid tumors of other sites: Secondary | ICD-10-CM | POA: Diagnosis present

## 2019-05-27 DIAGNOSIS — R531 Weakness: Secondary | ICD-10-CM

## 2019-05-27 DIAGNOSIS — Z66 Do not resuscitate: Secondary | ICD-10-CM | POA: Diagnosis present

## 2019-05-27 DIAGNOSIS — E43 Unspecified severe protein-calorie malnutrition: Secondary | ICD-10-CM | POA: Diagnosis present

## 2019-05-27 DIAGNOSIS — Z515 Encounter for palliative care: Secondary | ICD-10-CM | POA: Diagnosis not present

## 2019-05-27 DIAGNOSIS — D72829 Elevated white blood cell count, unspecified: Secondary | ICD-10-CM

## 2019-05-27 DIAGNOSIS — I69391 Dysphagia following cerebral infarction: Secondary | ICD-10-CM

## 2019-05-27 DIAGNOSIS — Z903 Acquired absence of stomach [part of]: Secondary | ICD-10-CM

## 2019-05-27 DIAGNOSIS — Z9181 History of falling: Secondary | ICD-10-CM

## 2019-05-27 DIAGNOSIS — R7881 Bacteremia: Secondary | ICD-10-CM

## 2019-05-27 DIAGNOSIS — L89899 Pressure ulcer of other site, unspecified stage: Secondary | ICD-10-CM | POA: Diagnosis present

## 2019-05-27 DIAGNOSIS — S81012A Laceration without foreign body, left knee, initial encounter: Secondary | ICD-10-CM | POA: Diagnosis present

## 2019-05-27 DIAGNOSIS — R296 Repeated falls: Secondary | ICD-10-CM | POA: Diagnosis present

## 2019-05-27 LAB — CBC WITH DIFFERENTIAL/PLATELET
Abs Immature Granulocytes: 0.54 10*3/uL — ABNORMAL HIGH (ref 0.00–0.07)
Basophils Absolute: 0 10*3/uL (ref 0.0–0.1)
Basophils Relative: 0 %
Eosinophils Absolute: 0 10*3/uL (ref 0.0–0.5)
Eosinophils Relative: 0 %
HCT: 41.6 % (ref 36.0–46.0)
Hemoglobin: 13.2 g/dL (ref 12.0–15.0)
Immature Granulocytes: 2 %
Lymphocytes Relative: 4 %
Lymphs Abs: 1.1 10*3/uL (ref 0.7–4.0)
MCH: 25 pg — ABNORMAL LOW (ref 26.0–34.0)
MCHC: 31.7 g/dL (ref 30.0–36.0)
MCV: 78.6 fL — ABNORMAL LOW (ref 80.0–100.0)
Monocytes Absolute: 0.5 10*3/uL (ref 0.1–1.0)
Monocytes Relative: 2 %
Neutro Abs: 24.4 10*3/uL — ABNORMAL HIGH (ref 1.7–7.7)
Neutrophils Relative %: 92 %
Platelets: 400 10*3/uL (ref 150–400)
RBC: 5.29 MIL/uL — ABNORMAL HIGH (ref 3.87–5.11)
RDW: 19.4 % — ABNORMAL HIGH (ref 11.5–15.5)
Smear Review: NORMAL
WBC: 26.5 10*3/uL — ABNORMAL HIGH (ref 4.0–10.5)
nRBC: 0 % (ref 0.0–0.2)

## 2019-05-27 LAB — URINALYSIS, COMPLETE (UACMP) WITH MICROSCOPIC
Bacteria, UA: NONE SEEN
Bilirubin Urine: NEGATIVE
Glucose, UA: NEGATIVE mg/dL
Hgb urine dipstick: NEGATIVE
Ketones, ur: NEGATIVE mg/dL
Leukocytes,Ua: NEGATIVE
Nitrite: NEGATIVE
Protein, ur: NEGATIVE mg/dL
Specific Gravity, Urine: 1.019 (ref 1.005–1.030)
Squamous Epithelial / HPF: NONE SEEN (ref 0–5)
pH: 5 (ref 5.0–8.0)

## 2019-05-27 LAB — COMPREHENSIVE METABOLIC PANEL
ALT: 20 U/L (ref 0–44)
AST: 33 U/L (ref 15–41)
Albumin: 2.2 g/dL — ABNORMAL LOW (ref 3.5–5.0)
Alkaline Phosphatase: 188 U/L — ABNORMAL HIGH (ref 38–126)
Anion gap: 12 (ref 5–15)
BUN: 27 mg/dL — ABNORMAL HIGH (ref 8–23)
CO2: 27 mmol/L (ref 22–32)
Calcium: 8.4 mg/dL — ABNORMAL LOW (ref 8.9–10.3)
Chloride: 101 mmol/L (ref 98–111)
Creatinine, Ser: 0.84 mg/dL (ref 0.44–1.00)
GFR calc Af Amer: >60 mL/min (ref >60)
GFR calc non Af Amer: >60 mL/min (ref >60)
Glucose, Bld: 124 mg/dL — ABNORMAL HIGH (ref 70–99)
Potassium: 4.4 mmol/L (ref 3.5–5.1)
Sodium: 140 mmol/L (ref 135–145)
Total Bilirubin: 1 mg/dL (ref 0.3–1.2)
Total Protein: 5.9 g/dL — ABNORMAL LOW (ref 6.5–8.1)

## 2019-05-27 LAB — TROPONIN I (HIGH SENSITIVITY): Troponin I (High Sensitivity): 33 ng/L — ABNORMAL HIGH (ref <18)

## 2019-05-27 LAB — LACTIC ACID, PLASMA: Lactic Acid, Venous: 3.1 mmol/L (ref 0.5–1.9)

## 2019-05-27 MED ORDER — VANCOMYCIN HCL IN DEXTROSE 1-5 GM/200ML-% IV SOLN
1000.0000 mg | Freq: Once | INTRAVENOUS | Status: AC
  Administered 2019-05-27: 1000 mg via INTRAVENOUS
  Filled 2019-05-27: qty 200

## 2019-05-27 MED ORDER — SODIUM CHLORIDE 0.9 % IV SOLN
Freq: Once | INTRAVENOUS | Status: AC
  Administered 2019-05-28: via INTRAVENOUS

## 2019-05-27 MED ORDER — SODIUM CHLORIDE 0.9 % IV BOLUS
500.0000 mL | Freq: Once | INTRAVENOUS | Status: AC
  Administered 2019-05-27: 500 mL via INTRAVENOUS

## 2019-05-27 MED ORDER — IOHEXOL 300 MG/ML  SOLN
75.0000 mL | Freq: Once | INTRAMUSCULAR | Status: AC | PRN
  Administered 2019-05-27: 75 mL via INTRAVENOUS

## 2019-05-27 MED ORDER — SODIUM CHLORIDE 0.9 % IV SOLN
2.0000 g | Freq: Once | INTRAVENOUS | Status: AC
  Administered 2019-05-27: 2 g via INTRAVENOUS
  Filled 2019-05-27: qty 2

## 2019-05-27 NOTE — ED Notes (Signed)
This RN attmped 1x iv and Francoise Schaumann attempted 2x. Iv team consulted.

## 2019-05-27 NOTE — ED Triage Notes (Signed)
Pt arrived via EMS from home where family reports pt has had 3 falls this week and today pt c/o bilateral lower leg pain. EMS reports pt was recently placed on a pureed diet due to stroke and since has had diarrhea and failure to thrive where pt has not wanted to eat. Pt has bandages to bilateral lower legs, bilateral arms.

## 2019-05-27 NOTE — ED Provider Notes (Signed)
[facility] Emergency Department Provider Note  ____________________________________________   I have reviewed the triage vital signs and the nursing notes.   HISTORY  Chief Complaint Weakness  History limited by: Not Limited   HPI Veronica Wang is a 83 y.o. female who presents to the emergency department today because of concern for weakness and falls. The patient states that she has felt weak for the past couple of weeks. The patient has had multiple falls. Says she fell roughly 1.5 weeks ago and landed on her left side. Also has complaints of blisters that form on her legs and open up. Has had decreased oral intake over this time. Denies any fevers. Denies any chest pain or shortness.    Records reviewed. Per medical record review patient has a history of COPD, CHF, recent visit to the ER for fall.   Past Medical History:  Diagnosis Date  . Abnormal MRI 09/10/2015  . Atrial fibrillation ([place])   . Cancer ([place])    Liver  . Cancer ([place])    stomach  . Carcinoid tumor of intestine   . CHF (congestive heart failure) ([place])   . COPD (chronic obstructive pulmonary disease) ([place])   . FH: chemotherapy   . GERD (gastroesophageal reflux disease)   . Hyperlipidemia   . Hypertension   . Macular degeneration   . Polycythemia   . Radiation     Patient Active Problem List   Diagnosis Date Noted  . Aspiration pneumonia ([place]) 04/30/2019  . Lower limb ulcer, calf ([place]) 03/25/2019  . Swelling of limb 03/25/2019  . Gross hematuria 03/23/2019  . Acute exacerbation of CHF (congestive heart failure) ([place]) 10/26/2018  . Protein-calorie malnutrition, severe 08/14/2018  . Stroke ([place]) 08/13/2018  . HTN (hypertension) 08/13/2018  . HLD (hyperlipidemia) 08/13/2018  . Chronic combined systolic (congestive) and diastolic (congestive) heart failure ([place]) 11/27/2017  . PAF (paroxysmal atrial fibrillation) ([place]) 11/27/2017  . Abnormal MRI 09/10/2015  . Malnutrition of  moderate degree ([place]) 12/06/2014  . Pneumonia 12/05/2014  . COPD (chronic obstructive pulmonary disease) ([place]) 12/05/2014  . 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 CATARACT EXTRACTION  splenectomy  . COLON RESECTION    . COLON SURGERY    . GASTRECTOMY    . SPLENECTOMY      Prior to Admission medications   Medication Sig Start Date End Date Taking? Authorizing Provider  acetaminophen (TYLENOL) 325 MG tablet Take 2 tablets (650 mg total) by mouth every 6 (six) hours as needed for mild pain (or Fever >/= 101). 12/15/14   Tama High III, MD  albuterol (PROVENTIL HFA;VENTOLIN HFA) 108 (90 BASE) MCG/ACT inhaler Inhale 2 puffs into the lungs every 6 (six) hours as needed for wheezing or shortness of breath.    [provider]  cephALEXin (KEFLEX) 500 MG capsule Take 2 capsules (1,000 mg total) by mouth 2 (two) times daily. 05/06/19   Cuthriell, Charline Bills, PA-C  Cholecalciferol (VITAMIN D-1000 MAX ST) 1000 UNITS tablet Take 1,000 Units by mouth daily.     [provider]  Cyanocobalamin (VITAMIN B-12 CR) 1000 MCG TBCR Take 1,000 mcg by mouth daily.     [provider]  ELIQUIS 2.5 MG TABS tablet 2.5 mg 2 (two) times daily.  04/09/19   [provider]  feeding supplement (BOOST HIGH PROTEIN) LIQD Take 1 Container by mouth 3 (three) times daily between meals.    [provider]  HYDROcodone-acetaminophen (NORCO/VICODIN) 5-325 MG tablet Take 1 tablet by mouth every 4 (four) hours as needed for moderate pain. 05/06/19   Cuthriell, Charline Bills, PA-C  LANREOTIDE ACETATE [place] Inject into the skin.    [provider]  lisinopril (PRINIVIL,ZESTRIL) 5 MG tablet Take 5 mg by mouth daily. 06/18/18   [provider]  loperamide (IMODIUM A-D) 2 MG tablet Take 4 mg by mouth 4 (four) times daily as needed for diarrhea or loose stools.    [provider]  Multiple Vitamins-Minerals  (CENTRUM SILVER) tablet Take 1 tablet by mouth daily.  03/02/11   [provider]  ondansetron (ZOFRAN) 8 MG tablet Take 1 tablet (8 mg total) by mouth every 8 (eight) hours as needed for nausea or vomiting. 09/17/17   Jacquelin Hawking, NP  pantoprazole (PROTONIX) 40 MG tablet Take 1 tablet (40 mg total) by mouth daily. 05/03/19 05/02/20  Dustin Flock, MD  phenazopyridine (PYRIDIUM) 100 MG tablet Take 1 tablet (100 mg total) by mouth 2 (two) times daily as needed for pain (dysuria). 03/17/19 03/16/20  Delman Kitten, MD  Telotristat Etiprate 250 MG TABS Take 1 tablet by mouth 3 (three) times daily. 05/27/18   [provider]    Allergies No known allergies  Family History  Problem Relation Age of Onset  . Heart attack Sister   . Heart attack Brother   . Leukemia Mother     Social History Social History   Tobacco Use  . Smoking status: Former Smoker    Types: Cigarettes  . Smokeless tobacco: Never Used  Substance Use Topics  . Alcohol use: No    Alcohol/week: 0.0 standard drinks  . Drug use: No    Review of Systems Constitutional: No fever/chills Eyes: No visual changes. ENT: No sore throat. Cardiovascular: Denies chest pain. Respiratory: Denies shortness of breath. Gastrointestinal: No abdominal pain.  No nausea, no vomiting.  No diarrhea.   Genitourinary: Negative for dysuria. Musculoskeletal: Positive for leg pain.  Skin: Negative for rash. Neurological: Negative for headaches, focal weakness or numbness.  ____________________________________________   PHYSICAL EXAM:  VITAL SIGNS: ED Triage Vitals  Enc Vitals Group     BP 06/03/2019 2016 (!) 86/75     Pulse Rate 05/16/2019 2016 89     Resp 05/20/2019 2016 (!) 22     Temp 05/14/2019 2016 99.6 F (37.6 C)     Temp Source 06/09/2019 2016 Oral     SpO2 06/06/2019 2016 98 %     Weight 05/31/2019 1955 80 lb 11 oz (36.6 kg)     Height 06/08/2019 2015 4\' 11"  (1.499 m)   Constitutional: Alert and oriented.  Eyes:  Conjunctivae are normal.  ENT      Head: Normocephalic. Old appearing wound to the left forehead.       Nose: No congestion/rhinnorhea.      Mouth/Throat: Mucous membranes are moist.      Neck: No stridor. Hematological/Lymphatic/Immunilogical: No cervical lymphadenopathy. Cardiovascular: Normal rate, regular rhythm.  No murmurs, rubs, or gallops.  Respiratory: Normal respiratory effort without tachypnea nor retractions. Breath sounds are clear and equal bilaterally. No wheezes/rales/rhonchi. Gastrointestinal: Soft and non tender. No rebound. No guarding.  Genitourinary: Deferred Musculoskeletal: Normal range of motion in all extremities. No lower extremity edema. Neurologic:  Normal speech and language. No gross focal neurologic deficits are appreciated.  Skin:  Multiple wounds noted on bilateral legs, left upper arm.  Psychiatric: Mood and affect are normal. Speech and behavior are normal. Patient exhibits  appropriate insight and judgment.  ____________________________________________    LABS (pertinent positives/negatives)  Lactic acid 3.1 CBC wbc 26.5, hgb 13.2, plt 400 Trop hs 33 CMP na 140, k 4.4, glu 124, cr 0.84, ca 8.4  ____________________________________________   EKG  I, Nance Pear, attending physician, personally viewed and interpreted this EKG  EKG Time: 2023 Rate: 138 Rhythm: sinus tachycardia with irregular rate Axis: right axis deviation Intervals: qtc 447 QRS: narrow, q waves v1 ST changes: no st elevation Impression: abnormal ekg ____________________________________________    RADIOLOGY  CXR Persistent left lower pleural effusion and consolidate.   ____________________________________________   PROCEDURES  Procedures  CRITICAL CARE Performed by: Nance Pear   Total critical care time: 35 minutes  Critical care time was exclusive of separately billable procedures and treating other patients.  Critical care was necessary to  treat or prevent imminent or life-threatening deterioration.  Critical care was time spent personally by me on the following activities: development of treatment plan with patient and/or surrogate as well as nursing, discussions with consultants, evaluation of patient's response to treatment, examination of patient, obtaining history from patient or surrogate, ordering and performing treatments and interventions, ordering and review of laboratory studies, ordering and review of radiographic studies, pulse oximetry and re-evaluation of patient's condition.  ____________________________________________   INITIAL IMPRESSION / ASSESSMENT AND PLAN / ED COURSE  Pertinent labs & imaging results that were available during my care of the patient were reviewed by me and considered in my medical decision making (see chart for details).   Patient presented to the emergency department today because of concern for weakness and falls. Patient was noted to be hypotensive and tachycardic. Did have concern for infection. Lactic acid level as well as WBC both elevated in the serum. Patient was started on IV antibiotics. Given IV fluids. CXR showed persistent left pleural effusion and consolidation. UA still pending at time of admission. Discussed concern for infection with patient and family. Discussed plan.  ____________________________________________   FINAL CLINICAL IMPRESSION(S) / ED DIAGNOSES  Final diagnoses:  Weakness  Lactic acidosis  Leukocytosis, unspecified type     Note: This dictation was prepared with Dragon dictation. Any transcriptional errors that result from this process are unintentional     Nance Pear, MD 06/05/2019 2311

## 2019-05-27 NOTE — ED Notes (Signed)
Date and time results received: 05/23/2019    Test: Lactic acid  Critical Value: 3.1  Name of Provider Notified: Dr Archie Balboa

## 2019-05-28 ENCOUNTER — Inpatient Hospital Stay: Payer: Medicare Other

## 2019-05-28 ENCOUNTER — Encounter: Payer: Self-pay | Admitting: Internal Medicine

## 2019-05-28 ENCOUNTER — Encounter (INDEPENDENT_AMBULATORY_CARE_PROVIDER_SITE_OTHER): Payer: Medicare Other

## 2019-05-28 DIAGNOSIS — I4891 Unspecified atrial fibrillation: Secondary | ICD-10-CM | POA: Diagnosis present

## 2019-05-28 DIAGNOSIS — I69391 Dysphagia following cerebral infarction: Secondary | ICD-10-CM | POA: Diagnosis not present

## 2019-05-28 DIAGNOSIS — J449 Chronic obstructive pulmonary disease, unspecified: Secondary | ICD-10-CM | POA: Diagnosis present

## 2019-05-28 DIAGNOSIS — L8990 Pressure ulcer of unspecified site, unspecified stage: Secondary | ICD-10-CM | POA: Insufficient documentation

## 2019-05-28 DIAGNOSIS — Z8249 Family history of ischemic heart disease and other diseases of the circulatory system: Secondary | ICD-10-CM | POA: Diagnosis not present

## 2019-05-28 DIAGNOSIS — Z7189 Other specified counseling: Secondary | ICD-10-CM | POA: Diagnosis not present

## 2019-05-28 DIAGNOSIS — R6510 Systemic inflammatory response syndrome (SIRS) of non-infectious origin without acute organ dysfunction: Secondary | ICD-10-CM | POA: Diagnosis present

## 2019-05-28 DIAGNOSIS — Z66 Do not resuscitate: Secondary | ICD-10-CM | POA: Diagnosis present

## 2019-05-28 DIAGNOSIS — S065XAA Traumatic subdural hemorrhage with loss of consciousness status unknown, initial encounter: Secondary | ICD-10-CM | POA: Diagnosis present

## 2019-05-28 DIAGNOSIS — K219 Gastro-esophageal reflux disease without esophagitis: Secondary | ICD-10-CM | POA: Diagnosis present

## 2019-05-28 DIAGNOSIS — R54 Age-related physical debility: Secondary | ICD-10-CM | POA: Diagnosis present

## 2019-05-28 DIAGNOSIS — W19XXXA Unspecified fall, initial encounter: Secondary | ICD-10-CM | POA: Diagnosis present

## 2019-05-28 DIAGNOSIS — R296 Repeated falls: Secondary | ICD-10-CM | POA: Diagnosis present

## 2019-05-28 DIAGNOSIS — Z79899 Other long term (current) drug therapy: Secondary | ICD-10-CM | POA: Diagnosis not present

## 2019-05-28 DIAGNOSIS — L97219 Non-pressure chronic ulcer of right calf with unspecified severity: Secondary | ICD-10-CM | POA: Diagnosis present

## 2019-05-28 DIAGNOSIS — Z681 Body mass index (BMI) 19 or less, adult: Secondary | ICD-10-CM | POA: Diagnosis not present

## 2019-05-28 DIAGNOSIS — E872 Acidosis, unspecified: Secondary | ICD-10-CM

## 2019-05-28 DIAGNOSIS — L89616 Pressure-induced deep tissue damage of right heel: Secondary | ICD-10-CM | POA: Diagnosis present

## 2019-05-28 DIAGNOSIS — Z20828 Contact with and (suspected) exposure to other viral communicable diseases: Secondary | ICD-10-CM | POA: Diagnosis present

## 2019-05-28 DIAGNOSIS — D72829 Elevated white blood cell count, unspecified: Secondary | ICD-10-CM

## 2019-05-28 DIAGNOSIS — R64 Cachexia: Secondary | ICD-10-CM | POA: Diagnosis present

## 2019-05-28 DIAGNOSIS — I5022 Chronic systolic (congestive) heart failure: Secondary | ICD-10-CM | POA: Diagnosis present

## 2019-05-28 DIAGNOSIS — L89152 Pressure ulcer of sacral region, stage 2: Secondary | ICD-10-CM | POA: Diagnosis present

## 2019-05-28 DIAGNOSIS — A4159 Other Gram-negative sepsis: Secondary | ICD-10-CM | POA: Diagnosis present

## 2019-05-28 DIAGNOSIS — E340 Carcinoid syndrome: Secondary | ICD-10-CM | POA: Diagnosis present

## 2019-05-28 DIAGNOSIS — Y92009 Unspecified place in unspecified non-institutional (private) residence as the place of occurrence of the external cause: Secondary | ICD-10-CM | POA: Diagnosis not present

## 2019-05-28 DIAGNOSIS — I110 Hypertensive heart disease with heart failure: Secondary | ICD-10-CM | POA: Diagnosis present

## 2019-05-28 DIAGNOSIS — R531 Weakness: Secondary | ICD-10-CM

## 2019-05-28 DIAGNOSIS — S065X9A Traumatic subdural hemorrhage with loss of consciousness of unspecified duration, initial encounter: Secondary | ICD-10-CM | POA: Diagnosis present

## 2019-05-28 DIAGNOSIS — E43 Unspecified severe protein-calorie malnutrition: Secondary | ICD-10-CM | POA: Diagnosis present

## 2019-05-28 DIAGNOSIS — C7B09 Secondary carcinoid tumors of other sites: Secondary | ICD-10-CM | POA: Diagnosis present

## 2019-05-28 DIAGNOSIS — D3A098 Benign carcinoid tumors of other sites: Secondary | ICD-10-CM

## 2019-05-28 DIAGNOSIS — Z515 Encounter for palliative care: Secondary | ICD-10-CM | POA: Diagnosis not present

## 2019-05-28 DIAGNOSIS — R7881 Bacteremia: Secondary | ICD-10-CM

## 2019-05-28 DIAGNOSIS — Z9181 History of falling: Secondary | ICD-10-CM | POA: Diagnosis not present

## 2019-05-28 LAB — BLOOD CULTURE ID PANEL (REFLEXED)

## 2019-05-28 LAB — SARS CORONAVIRUS 2 (TAT 6-24 HRS): SARS Coronavirus 2: NEGATIVE

## 2019-05-28 LAB — LACTIC ACID, PLASMA: Lactic Acid, Venous: 2.5 mmol/L (ref 0.5–1.9)

## 2019-05-28 LAB — TROPONIN I (HIGH SENSITIVITY): Troponin I (High Sensitivity): 34 ng/L — ABNORMAL HIGH (ref <18)

## 2019-05-28 MED ORDER — ONDANSETRON HCL 4 MG PO TABS: 4.0000 mg | ORAL_TABLET | Freq: Four times a day (QID) | ORAL | Status: DC | PRN

## 2019-05-28 MED ORDER — TELOTRISTAT ETHYL(AS ETIPRATE) 250 MG PO TABS
1.0000 | ORAL_TABLET | Freq: Three times a day (TID) | ORAL | Status: DC
  Administered 2019-05-28 – 2019-05-30 (×5): 1 via ORAL
  Filled 2019-05-28 (×7): qty 1

## 2019-05-28 MED ORDER — ACETAMINOPHEN 650 MG RE SUPP: 650.0000 mg | Freq: Four times a day (QID) | RECTAL | Status: DC | PRN

## 2019-05-28 MED ORDER — PANTOPRAZOLE SODIUM 40 MG PO TBEC
40.0000 mg | DELAYED_RELEASE_TABLET | Freq: Every day | ORAL | Status: DC
  Administered 2019-05-28 – 2019-05-30 (×3): 40 mg via ORAL
  Filled 2019-05-28 (×3): qty 1

## 2019-05-28 MED ORDER — SODIUM CHLORIDE 0.9 % IV SOLN
INTRAVENOUS | Status: DC | PRN
  Administered 2019-05-28: 250 mL via INTRAVENOUS

## 2019-05-28 MED ORDER — ONDANSETRON HCL 4 MG/2ML IJ SOLN: 4.0000 mg | Freq: Four times a day (QID) | INTRAMUSCULAR | Status: DC | PRN

## 2019-05-28 MED ORDER — SODIUM CHLORIDE 0.9 % IV SOLN
2.0000 g | Freq: Two times a day (BID) | INTRAVENOUS | Status: DC
  Administered 2019-05-28: 2 g via INTRAVENOUS
  Filled 2019-05-28: qty 2

## 2019-05-28 MED ORDER — METOPROLOL TARTRATE 5 MG/5ML IV SOLN
INTRAVENOUS | Status: AC
  Administered 2019-05-28: 2.5 mg via INTRAVENOUS
  Filled 2019-05-28: qty 5

## 2019-05-28 MED ORDER — ALBUTEROL SULFATE (2.5 MG/3ML) 0.083% IN NEBU: 2.5000 mg | INHALATION_SOLUTION | Freq: Four times a day (QID) | RESPIRATORY_TRACT | Status: DC | PRN

## 2019-05-28 MED ORDER — SODIUM CHLORIDE 0.9% FLUSH
10.0000 mL | Freq: Two times a day (BID) | INTRAVENOUS | Status: DC
  Administered 2019-05-28 – 2019-05-30 (×6): 10 mL

## 2019-05-28 MED ORDER — PROTHROMBIN COMPLEX CONC HUMAN 500 UNITS IV KIT
1750.0000 [IU] | PACK | Status: AC
  Administered 2019-05-28: 1750 [IU] via INTRAVENOUS
  Filled 2019-05-28: qty 1750

## 2019-05-28 MED ORDER — ACETAMINOPHEN 325 MG PO TABS
650.0000 mg | ORAL_TABLET | Freq: Four times a day (QID) | ORAL | Status: DC | PRN
  Administered 2019-05-28: 650 mg via ORAL
  Filled 2019-05-28: qty 2

## 2019-05-28 MED ORDER — DOUBLE ANTIBIOTIC 500-10000 UNIT/GM EX OINT
TOPICAL_OINTMENT | Freq: Every day | CUTANEOUS | Status: DC
  Filled 2019-05-28: qty 28.4

## 2019-05-28 MED ORDER — LOPERAMIDE HCL 2 MG PO CAPS
2.0000 mg | ORAL_CAPSULE | ORAL | Status: DC | PRN
  Administered 2019-05-29: 2 mg via ORAL
  Filled 2019-05-28 (×4): qty 1

## 2019-05-28 MED ORDER — LEVOFLOXACIN IN D5W 750 MG/150ML IV SOLN
750.0000 mg | INTRAVENOUS | Status: DC
  Administered 2019-05-28: 750 mg via INTRAVENOUS
  Filled 2019-05-28: qty 150

## 2019-05-28 MED ORDER — LABETALOL HCL 5 MG/ML IV SOLN
10.0000 mg | INTRAVENOUS | Status: DC | PRN
  Filled 2019-05-28: qty 4

## 2019-05-28 MED ORDER — VANCOMYCIN HCL IN DEXTROSE 750-5 MG/150ML-% IV SOLN: 750.0000 mg | INTRAVENOUS | Status: DC

## 2019-05-28 MED ORDER — DOUBLE ANTIBIOTIC 500-10000 UNIT/GM EX OINT: TOPICAL_OINTMENT | Freq: Every day | CUTANEOUS | Status: DC

## 2019-05-28 MED ORDER — METOPROLOL TARTRATE 5 MG/5ML IV SOLN
2.5000 mg | Freq: Once | INTRAVENOUS | Status: AC
  Administered 2019-05-28: 01:00:00 2.5 mg via INTRAVENOUS

## 2019-05-28 MED ORDER — SODIUM CHLORIDE 0.9% FLUSH
10.0000 mL | INTRAVENOUS | Status: DC | PRN
  Administered 2019-05-28: 10 mL
  Filled 2019-05-28: qty 40

## 2019-05-28 MED ORDER — BACITRACIN-NEOMYCIN-POLYMYXIN 400-5-5000 EX OINT
TOPICAL_OINTMENT | Freq: Every day | CUTANEOUS | Status: DC
  Administered 2019-05-29: 1 via TOPICAL
  Filled 2019-05-28 (×4): qty 1

## 2019-05-28 NOTE — ED Notes (Signed)
Wound care nurse at bedside.

## 2019-05-28 NOTE — ED Notes (Signed)
Pt returned from repeat CT

## 2019-05-28 NOTE — Progress Notes (Signed)
Same day rounding progress note  Seen in ED waiting for floor bed, changed bed status from ICU/SD as patient/family leaning towards comfort care.  1. GNR Bacteremia/Sepsis.  Patient has multiple wounds which could potentially be a source. Based on blood c/s growth of GNR - Abx changed to levaquin.  Cautiously hydrate given that patient has systolic heart failure. 2. Subacute subdural hematoma on the left frontoparietal area - Dr. Lacinda Axon on-call neurosurgeon who advised at this time to give reversal agent for apixaban and repeat CT head in 6 hours not showing any extension of bleed.  Discussed with patient's daughter about the plan also discussed that patient has risk for stroke that we are discontinuing apixaban and reversing agent.  Patient's daughter agreeable. She would like to keep her comfortable and transition to Hospice if she qualifies 3. A. fib with RVR 1 dose of 2.5 mg IV metoprolol has been given.  Closely observe on off unit tele.  Holding off apixaban due to subdural hematoma.  Patient's daughter agreeable.   4. Hypertension - remains hypotensive due to sepsis 5. History of carcinoid syndrome to continue home medications. 6. Severe protein calorie malnutrition - poor po intake 7. COPD not actively wheezing. 8. Chronic systolic heart failure last EF measured was in April 2020 which showed EF of 20 to 25%.  Holding Lasix or any diuretics due to low normal blood pressure septic picture. 9. History of previous stroke on apixaban which is on hold due to subdural hematoma patient's daughter agreeable. 10. History of dysphagia secondary to stroke. 11. Multiple skin wounds - appreciate wound team consult. See below wound listed per wound care nurse which I agree with, all present on admission  1. Forehead laceration (old) 2. Facial laceration (old)  3. Left wrist skin tear (fall)  4. Left wrist skin tear (fall) 5. Left knee skin tear (fall) 6. Right knee skin tear (fall) 7. Right knee skin  tear (fall) 8. Right elbow skin tear (fall) 9. Right LE full thickness ulceration lateral calf 10. Right LE full thickness ulceration posterior calf 11. Right heel deep tissue pressure injury 12. Left heel deep tissue pressure injury 13. Left posterior calf full thickness ulceration 14. Left great toe at first met head; medical device related pressure injury 15. Left dorsal foot; medical device related pressure injury 16. Right  dorsal great toe; medical device related pressure injury 17. Left pretibial; medical device related pressure injury 18. Left pretibial just below patella; medical device related pressure injury   Extremely poor prognosis - I had long d/w patient's daughter. She understands this and doesn't want her mother to suffer. She is still hoping that she continues till Holiday time for family to spend time with her. I've told her - she may not.  Will c/s Palliative care and CSW for Hospice screening  She seems appropriate for Hospice Home. Daughter in agreement. Prognosis < 2 weeks  Time spent - 35 mins

## 2019-05-28 NOTE — ED Notes (Signed)
Pts family reporting pt has a hard time eating solid food. Family and patient requesting a soft diet.

## 2019-05-28 NOTE — ED Notes (Signed)
Blue heel boots placed on right and left feet to float heals

## 2019-05-28 NOTE — ED Notes (Signed)
Pt placed on bed pan and had a small bowel movement. Pt able to lift hips independently and pull pants up and down. PT able to wipe self.

## 2019-05-28 NOTE — ED Notes (Signed)
Family(daughter) updated on pts CT scan findings as well as plan of care.

## 2019-05-28 NOTE — Progress Notes (Signed)
Pharmacy Antibiotic Note  Schyler Shell is a 83 y.o. female admitted on 05/16/2019 with pneumonia.  Pharmacy has been consulted for vanc/cefepime dosing. Patient received vanc 1g and cefepime 2g IV x 1 in the ED.  Plan: Vancomycin 750 mg IV Q 36 hrs. Goal AUC 400-550. Expected AUC: 513.0 SCr used: 0.84 Cssmin: 12.3  Will continue cefepime 2g IV q12h per CrCl 30.3 ml/min and will continue to monitor renal function and s/sx of infx and adjust as needed.  Height: 4\' 11"  (149.9 cm) Weight: 80 lb 11 oz (36.6 kg) IBW/kg (Calculated) : 43.2  Temp (24hrs), Avg:99.6 F (37.6 C), Min:99.6 F (37.6 C), Max:99.6 F (37.6 C)  Recent Labs  Lab 05/29/2019 2011 05/19/2019 2142 05/28/19 0016  WBC 26.5*  --   --   CREATININE 0.84  --   --   LATICACIDVEN  --  3.1* 2.5*    Estimated Creatinine Clearance: 30.3 mL/min (by C-G formula based on SCr of 0.84 mg/dL).    No Known Allergies  Thank you for allowing pharmacy to be a part of this patient's care.  Tobie Lords, PharmD, BCPS Clinical Pharmacist 05/28/2019 5:23 AM

## 2019-05-28 NOTE — Progress Notes (Addendum)
MEDICATION RELATED CONSULT NOTE - INITIAL   Pharmacy Consult for 71F-[facility]) Indication: subacute subdural hematoma s/t fall and on eliquis  No Known Allergies  Patient Measurements: Height: 4\' 11"  (149.9 cm) Weight: 80 lb 11 oz (36.6 kg) IBW/kg (Calculated) : 43.2  Vital Signs: Temp: 99.6 F (37.6 C) (11/17 2016) Temp Source: Oral (11/17 2016) BP: 100/77 (11/18 0023) Pulse Rate: 89 (11/17 2016) Intake/Output from previous day: No intake/output data recorded. Intake/Output from this shift: No intake/output data recorded.  Labs: Recent Labs    05/19/2019 2011  WBC 26.5*  HGB 13.2  HCT 41.6  PLT 400  CREATININE 0.84  ALBUMIN 2.2*  PROT 5.9*  AST 33  ALT 20  ALKPHOS 188*  BILITOT 1.0   Estimated Creatinine Clearance: 30.3 mL/min (by C-G formula based on SCr of 0.84 mg/dL).   Microbiology: Recent Results (from the past 720 hour(s))  SARS Coronavirus 2 by RT PCR (hospital order, performed in [facility] hospital lab) Nasopharyngeal Nasopharyngeal Swab     Status: None   Collection Time: 04/30/19 10:47 AM   Specimen: Nasopharyngeal Swab  Result Value Ref Range Status   SARS Coronavirus 2 NEGATIVE NEGATIVE Final    Comment: (NOTE) If result is NEGATIVE SARS-CoV-2 target nucleic acids are NOT DETECTED. The SARS-CoV-2 RNA is generally detectable in upper and lower  respiratory specimens during the acute phase of infection. The lowest  concentration of SARS-CoV-2 viral copies this assay can detect is 250  copies / mL. A negative result does not preclude SARS-CoV-2 infection  and should not be used as the sole basis for treatment or other  patient management decisions.  A negative result may occur with  improper specimen collection / handling, submission of specimen other  than nasopharyngeal swab, presence of viral mutation(s) within the  areas targeted by this assay, and inadequate number of viral copies  (<250 copies / mL). A negative result must be combined  with clinical  observations, patient history, and epidemiological information. If result is POSITIVE SARS-CoV-2 target nucleic acids are DETECTED. The SARS-CoV-2 RNA is generally detectable in upper and lower  respiratory specimens dur ing the acute phase of infection.  Positive  results are indicative of active infection with SARS-CoV-2.  Clinical  correlation with patient history and other diagnostic information is  necessary to determine patient infection status.  Positive results do  not rule out bacterial infection or co-infection with other viruses. If result is PRESUMPTIVE POSTIVE SARS-CoV-2 nucleic acids MAY BE PRESENT.   A presumptive positive result was obtained on the submitted specimen  and confirmed on repeat testing.  While 2019 novel coronavirus  (SARS-CoV-2) nucleic acids may be present in the submitted sample  additional confirmatory testing may be necessary for epidemiological  and / or clinical management purposes  to differentiate between  SARS-CoV-2 and other Sarbecovirus currently known to infect humans.  If clinically indicated additional testing with an alternate test  methodology [phone]) is advised. The SARS-CoV-2 RNA is generally  detectable in upper and lower respiratory sp ecimens during the acute  phase of infection. The expected result is Negative. Fact Sheet for Patients:  StrictlyIdeas.no Fact Sheet for Healthcare Providers: BankingDealers.co.za This test is not yet approved or cleared by the [place] FDA and has been authorized for detection and/or diagnosis of SARS-CoV-2 by FDA under an Emergency Use Authorization (EUA).  This EUA will remain in effect (meaning this test can be used) for the duration of the COVID-19 declaration under Section 564(b)(1) of  the Act, 21 U.S.C. section 360bbb-3(b)(1), unless the authorization is terminated or revoked sooner. Performed at [facility], [address] [place], [place] [address]   Aerobic culture     Status: None   Collection Time: 05/20/19 12:00 AM  Result Value Ref Range Status   Aerobic Bacterial Culture Final report  Final   Organism ID, Bacteria Comment  Final    Comment: Multiple negative rods present, none predominant. This is indicative of a heavily colonized/contaminated specimen site. No further microbiological characterization will be done as it will not provide clinically relevant information. Heavy growth     Medical History: Past Medical History:  Diagnosis Date  . Abnormal MRI 09/10/2015  . Atrial fibrillation ([place])   . Cancer ([place])    Liver  . Cancer ([place])    stomach  . Carcinoid tumor of intestine   . CHF (congestive heart failure) ([place])   . COPD (chronic obstructive pulmonary disease) ([place])   . FH: chemotherapy   . GERD (gastroesophageal reflux disease)   . Hyperlipidemia   . Hypertension   . Macular degeneration   . Polycythemia   . Radiation     Medications:  Scheduled:  . sodium chloride flush  10-40 mL Intracatheter Q12H    Assessment: Patient admitted s/t fall and has weakness is in afib w/ RVR anticoagulated w/ eliquis 2.5 mg bid, shown to have a 6 mm subdural hematoma w/o midline shift s/t fall as a result of anticoagulation.   Goal of Therapy:  Reversal and/or stopping progression of hematoma. Reversal of mental status to baseline.  Plan:  Will dose Kcentra 50 units/kg for eliquis related bleed. Dose will come to 1750 units after rounding comes to ~ 47 units/kg. Will f/u on CT of head in 6 hours to assess range of bleed. Will continue to monitor CBC and monitor for any thromboembolic complications as a result of KCentra therapy.  Tobie Lords, PharmD, BCPS Clinical Pharmacist 05/28/2019,2:00 AM

## 2019-05-28 NOTE — Progress Notes (Signed)
PHARMACY - PHYSICIAN COMMUNICATION CRITICAL VALUE ALERT - BLOOD CULTURE IDENTIFICATION (BCID)  Results for orders placed or performed during the hospital encounter of 05/26/2019  Blood Culture ID Panel (Reflexed) (Collected: 05/29/2019 10:50 PM)  Result Value Ref Range   Enterococcus species NOT DETECTED NOT DETECTED   Listeria monocytogenes NOT DETECTED NOT DETECTED   Staphylococcus species NOT DETECTED NOT DETECTED   Staphylococcus aureus (BCID) NOT DETECTED NOT DETECTED   Streptococcus species NOT DETECTED NOT DETECTED   Streptococcus agalactiae NOT DETECTED NOT DETECTED   Streptococcus pneumoniae NOT DETECTED NOT DETECTED   Streptococcus pyogenes NOT DETECTED NOT DETECTED   Acinetobacter baumannii NOT DETECTED NOT DETECTED   Enterobacteriaceae species NOT DETECTED NOT DETECTED   Enterobacter cloacae complex NOT DETECTED NOT DETECTED   Escherichia coli NOT DETECTED NOT DETECTED   Klebsiella oxytoca NOT DETECTED NOT DETECTED   Klebsiella pneumoniae NOT DETECTED NOT DETECTED   Proteus species NOT DETECTED NOT DETECTED   Serratia marcescens NOT DETECTED NOT DETECTED   Haemophilus influenzae NOT DETECTED NOT DETECTED   Neisseria meningitidis NOT DETECTED NOT DETECTED   Pseudomonas aeruginosa NOT DETECTED NOT DETECTED   Candida albicans NOT DETECTED NOT DETECTED   Candida glabrata NOT DETECTED NOT DETECTED   Candida krusei NOT DETECTED NOT DETECTED   Candida parapsilosis NOT DETECTED NOT DETECTED   Candida tropicalis NOT DETECTED NOT DETECTED    Name of physician (or Provider) Contacted: Manuella Ghazi, Vipul   Changes to prescribed antibiotics required: Yes,  Will d/c vanc and cefepime and start levaquin  Kloe Oates D 05/28/2019  5:34 PM

## 2019-05-28 NOTE — ED Notes (Addendum)
Charted in error.

## 2019-05-28 NOTE — ED Notes (Signed)
Pt assisted to use bedpan. Pt had loose BM at this time. Pt daughter provided breakfast for pt. Redness and scabbed area noted on coccyx and pressure ulcer pad placed to prevent further breakdown

## 2019-05-28 NOTE — ED Notes (Signed)
Pt switched over to air mattress bed per wound nurse request due to pt wounds

## 2019-05-28 NOTE — H&P (Signed)
History and Physical    Veronica Wang [id number] DOB: 06/27/1936 DOA: 05/25/2019  PCP: Adin Hector, MD   Patient coming from: Home.  Chief Complaint: Weakness and falls.  HPI: Veronica Wang is a 83 y.o. female with history of metastatic carcinoid being followed at [facility], stroke, chronic systolic heart failure last EF measured in April 2020 was 20 to 25%, A. fib on Eliquis, protein calorie malnutrition multiple wounds was brought to the ER the patient was getting increasingly weak with multiple falls.  Patient's daughter who provided most of the history states that patient has progressively got weak since he got discharged last month from the hospital.  She has been a multiple falls.  But multiple members of the family had gone to visit patient at her apartment where she lives alone she was found to be on the floor on multiple occasions.  Due to ongoing weakness patient was brought to the ER.  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  Did not complain of any chest pain or shortness of breath has been having poor appetite.  Did not have any abdominal pain or nausea vomiting.  ED Course: In the ER patient was hypotensive was given fluid bolus.  Lab work show elevated lactate level leukocytosis with the CT chest and UA showing no definite signs of infection.  Has multiple wounds.  However since patient's presentation was concerning for sepsis patient was started on empiric antibiotic after cultures obtained.  COVID-19 test is pending.  After the admission patient went into A. fib with RVR for which I have ordered 1 dose of metoprolol 2.5 mg IV.  Since patient also had multiple falls CT head was ordered which shows 6 mm subdural hematoma on the left frontoparietal area.  Patient appears nonfocal.  Review of Systems: As per HPI, rest all negative.   Past Medical History:  Diagnosis Date  . Abnormal MRI 09/10/2015  . Atrial fibrillation ([place])   . Cancer ([place])    Liver   . Cancer ([place])    stomach  . Carcinoid tumor of intestine   . CHF (congestive heart failure) ([place])   . COPD (chronic obstructive pulmonary disease) ([place])   . FH: chemotherapy   . GERD (gastroesophageal reflux disease)   . Hyperlipidemia   . Hypertension   . Macular degeneration   . Polycythemia   . Radiation     Past Surgical History:  Procedure Laterality Date  . CATARACT EXTRACTION  splenectomy  . COLON RESECTION    . COLON SURGERY    . GASTRECTOMY    . SPLENECTOMY       reports that she has quit smoking. Her smoking use included cigarettes. She has never used smokeless tobacco. She reports that she does not drink alcohol or use drugs.  No Known Allergies  Family History  Problem Relation Age of Onset  . Heart attack Sister   . Heart attack Brother   . Leukemia Mother     Prior to Admission medications   Medication Sig Start Date End Date Taking? Authorizing Provider  albuterol (PROVENTIL HFA;VENTOLIN HFA) 108 (90 BASE) MCG/ACT inhaler Inhale 2 puffs into the lungs every 6 (six) hours as needed for wheezing or shortness of breath.   Yes [provider]  ELIQUIS 2.5 MG TABS tablet Take 2.5 mg by mouth 2 (two) times daily.  04/09/19  Yes [provider]  furosemide (LASIX) 20 MG tablet Take 20 mg by mouth daily as needed  for edema.    Yes [provider]  LANREOTIDE ACETATE [place] Inject 1 Dose into the skin every 14 (fourteen) days.    Yes [provider]  loperamide (IMODIUM A-D) 2 MG tablet Take 4 mg by mouth 4 (four) times daily as needed for diarrhea or loose stools.   Yes [provider]  pantoprazole (PROTONIX) 40 MG tablet Take 1 tablet (40 mg total) by mouth daily. 05/03/19 05/02/20 Yes Dustin Flock, MD  Telotristat Etiprate 250 MG TABS Take 1 tablet by mouth 3 (three) times daily. 05/27/18  Yes [provider]    Physical Exam: Constitutional: Moderately built and nourished. Vitals:   06/08/2019 2030 05/14/2019  2130 06/03/2019 2251 05/28/19 0023  BP: 96/77 96/75 121/86 100/77  Pulse:      Resp: (!) 21 (!) 24 (!) 24 17  Temp:      TempSrc:      SpO2:   98%   Weight:      Height:       Eyes: Anicteric no pallor. ENMT: No discharge from the ears eyes nose or mouth. Neck: No mass felt.  No neck rigidity. Respiratory: No rhonchi or crepitations. Cardiovascular: S1-S2 heard. Abdomen: Soft nontender bowel sounds present. Musculoskeletal: Multiple wounds. Skin: Multiple wounds. Neurologic: Alert awake oriented to time place and person.  Moves all extremities. Psychiatric: Appears normal per normal affect.   Labs on Admission: I have personally reviewed following labs and imaging studies  CBC: Recent Labs  Lab 06/03/2019 2011  WBC 26.5*  NEUTROABS 24.4*  HGB 13.2  HCT 41.6  MCV 78.6*  PLT [id number]   Basic Metabolic Panel: Recent Labs  Lab 05/12/2019 2011  NA 140  K 4.4  CL 101  CO2 27  GLUCOSE 124*  BUN 27*  CREATININE 0.84  CALCIUM 8.4*   GFR: Estimated Creatinine Clearance: 30.3 mL/min (by C-G formula based on SCr of 0.84 mg/dL). Liver Function Tests: Recent Labs  Lab 05/31/2019 2011  AST 33  ALT 20  ALKPHOS 188*  BILITOT 1.0  PROT 5.9*  ALBUMIN 2.2*   No results for input(s): LIPASE, AMYLASE in the last 168 hours. No results for input(s): AMMONIA in the last 168 hours. Coagulation Profile: No results for input(s): INR, PROTIME in the last 168 hours. Cardiac Enzymes: No results for input(s): CKTOTAL, CKMB, CKMBINDEX, TROPONINI in the last 168 hours. BNP (last 3 results) No results for input(s): PROBNP in the last 8760 hours. HbA1C: No results for input(s): HGBA1C in the last 72 hours. CBG: No results for input(s): GLUCAP in the last 168 hours. Lipid Profile: No results for input(s): CHOL, HDL, LDLCALC, TRIG, CHOLHDL, LDLDIRECT in the last 72 hours. Thyroid Function Tests: No results for input(s): TSH, T4TOTAL, FREET4, T3FREE, THYROIDAB in the last 72 hours. Anemia  Panel: No results for input(s): VITAMINB12, FOLATE, FERRITIN, TIBC, IRON, RETICCTPCT in the last 72 hours. Urine analysis:    Component Value Date/Time   COLORURINE YELLOW (A) 05/17/2019 2250   APPEARANCEUR CLEAR (A) 05/23/2019 2250   APPEARANCEUR Cloudy (A) 03/21/2019 1108   LABSPEC 1.019 05/24/2019 2250   LABSPEC 1.013 06/12/2014 0935   PHURINE 5.0 06/02/2019 2250   GLUCOSEU NEGATIVE 05/12/2019 2250   GLUCOSEU Negative 06/12/2014 0935   HGBUR NEGATIVE 05/29/2019 2250   BILIRUBINUR NEGATIVE 05/17/2019 2250   BILIRUBINUR Negative 03/21/2019 1108   BILIRUBINUR Negative 06/12/2014 0935   KETONESUR NEGATIVE 06/03/2019 2250   PROTEINUR NEGATIVE 06/05/2019 2250   NITRITE NEGATIVE 06/02/2019 2250   LEUKOCYTESUR NEGATIVE  05/29/2019 2250   LEUKOCYTESUR Negative 06/12/2014 0935   Sepsis Labs: @LABRCNTIP (procalcitonin:4,lacticidven:4) ) Recent Results (from the past 240 hour(s))  Aerobic culture     Status: None   Collection Time: 05/20/19 12:00 AM  Result Value Ref Range Status   Aerobic Bacterial Culture Final report  Final   Organism ID, Bacteria Comment  Final    Comment: Multiple negative rods present, none predominant. This is indicative of a heavily colonized/contaminated specimen site. No further microbiological characterization will be done as it will not provide clinically relevant information. Heavy growth      Radiological Exams on Admission: Ct Head Wo Contrast  Addendum Date: 05/28/2019   ADDENDUM REPORT: 05/28/2019 01:29 ADDENDUM: Critical Value/emergent results were called by telephone at the time of interpretation on 05/28/2019 at 1:29 am to Dr Gean Birchwood , who verbally acknowledged these results. Electronically Signed   By: Keith Rake M.D.   On: 05/28/2019 01:29   Result Date: 05/28/2019 CLINICAL DATA:  Ataxia, stroke suspected Weakness and falls. EXAM: CT HEAD WITHOUT CONTRAST TECHNIQUE: Contiguous axial images were obtained from the base of the  skull through the vertex without intravenous contrast. Examination performed less than 2 hours after contrast-enhanced chest CT. COMPARISON:  Head CT 05/06/2019 FINDINGS: Brain: Left frontoparietal subdural collection measures 6 mm, likely subacute without high density component. This is new from prior exam. Intravascular contrast from prior IV contrast administration partially limits evaluation for subarachnoid hemorrhage, allowing for this, subarachnoid hemorrhage is visualized. No intraparenchymal hemorrhage. No underlying mass effect or midline shift. Stable degree of generalized atrophy, moderate for age. Encephalomalacia in the left parietooccipital lobe. Generalized chronic small vessel ischemia. No evidence of acute ischemia. Basilar cisterns are patent. Vascular: Intravascular contrast from prior IV contrast administration for chest CT, no evidence of large vessel occlusion/hyperdense vessel. There is skull base atherosclerosis. Skull: No fracture or focal lesion. Sinuses/Orbits: Unchanged fluid level in the sphenoid sinus mild adjacent sclerosis, chronic. Mastoid air cells are clear. No acute orbital abnormality. Other: None. IMPRESSION: 1. Left frontoparietal subdural collection measures 6 mm, likely subacute. This is new from exam 3 weeks ago. No underlying mass effect or midline shift. 2. Stable degree of generalized atrophy and chronic small vessel ischemia. Encephalomalacia in the left parietooccipital lobe. Electronically Signed: By: Keith Rake M.D. On: 05/28/2019 01:12   Ct Chest W Contrast  Result Date: 05/26/2019 CLINICAL DATA:  Three falls this week. Acute resp illness, > 52 years old EXAM: CT CHEST WITH CONTRAST TECHNIQUE: Multidetector CT imaging of the chest was performed during intravenous contrast administration. CONTRAST:  74mL OMNIPAQUE IOHEXOL 300 MG/ML  SOLN COMPARISON:  Radiograph earlier this day. Chest CT 1 month ago 04/30/2019 FINDINGS: Cardiovascular: Atherosclerosis of  the thoracic aorta without dissection or acute aortic abnormality. Ectasia of the ascending aorta at 3.5 cm, unchanged. Conventional 3 vessel branching pattern from the aortic arch. Mild multi chamber cardiomegaly. There are coronary artery calcifications. No filling defects in the pulmonary arteries to the segmental level, there are no filling defects within the pulmonary arteries to suggest pulmonary embolus. No pericardial effusion. Mediastinum/Nodes: No enlarged mediastinal or hilar lymph nodes. Scattered areas of soft tissue wall thickening. No suspicious thyroid nodule. Lungs/Pleura: Moderate to advanced emphysema. Chronic atelectasis and volume loss of the medial left lower lobe. Small adjacent left pleural effusion. Findings are unchanged from prior exam. Minor atelectasis in the dependent right lower lobe. No right pleural effusion. No pulmonary edema. Trachea and mainstem bronchi are patent. No pulmonary mass. Mild  biapical pleuroparenchymal scarring. Upper Abdomen: Multiple enhancing liver lesions again seen, not significantly changed from prior. No acute findings. Musculoskeletal: Exaggerated thoracic kyphosis. Sclerosis involving anterior T10 vertebral body is unchanged from prior exam. Additional sclerotic lesions within T6 and T7 superior vertebral bodies also unchanged. Sclerosis in the sternum centered at the manubrium may be degenerative, but is nonspecific. Pectus excavatum deformity incidentally noted. Paucity of body fat suggesting cachexia. IMPRESSION: 1. Moderate to advanced emphysema. 2. Chronic atelectasis and volume loss in the medial left lower lobe. Small adjacent left pleural effusion. Findings are unchanged from CT last month. No new pulmonary disease. 3. Cardiomegaly with coronary artery calcifications. 4. Sclerotic lesions in the thoracic spine, stable. 5. Paucity of body fat suggest cachexia. Aortic Atherosclerosis (ICD10-I70.0) and Emphysema (ICD10-J43.9). Electronically Signed    By: Keith Rake M.D.   On: 06/01/2019 23:34   Dg Chest Portable 1 View  Result Date: 06/05/2019 CLINICAL DATA:  Weakness, multiple falls EXAM: PORTABLE CHEST 1 VIEW COMPARISON:  04/30/2019 FINDINGS: There is hyperinflation of the lungs compatible with COPD. Cardiomegaly. Left lower lobe opacity and left effusion again noted, unchanged. Right lung clear. No acute bony abnormality. IMPRESSION: COPD. Cardiomegaly. Persistent small left effusion with left lower lobe consolidation. No real change. Electronically Signed   By: Rolm Baptise M.D.   On: 05/23/2019 22:40     Assessment/Plan Principal Problem:   SIRS (systemic inflammatory response syndrome) (HCC) Active Problems:   Carcinoid tumor of intestine   COPD (chronic obstructive pulmonary disease) (HCC)   HTN (hypertension)   Atrial fibrillation with RVR (HCC)   Subdural hematoma (HCC)    1. SIRS source not clear.  Patient has multiple wounds which could potentially be a source.  Follow cultures continue with empiric antibiotics.  Cautiously hydrate given that patient has systolic heart failure. 2. Subacute subdural hematoma on the left frontoparietal area for which I discussed with Dr. Lacinda Axon on-call neurosurgeon who advised at this time to give reversal agent for apixaban and repeat CT head in 6 hours from the first 1 after the reversal agent.  Discussed with patient's daughter about the plan also discussed that patient has risk for stroke that we are discontinuing apixaban and reversing agent.  Patient's daughter agreeable. 3. A. fib with RVR 1 dose of 2.5 mg IV metoprolol has been given.  Closely observe in stepdown.  Holding off apixaban due to subdural hematoma.  Patient's daughter agreeable.   4. Hypertension was hypotensive on presentation. 5. History of carcinoid syndrome to continue home medications. 6. Severe protein calorie malnutrition will need nutrition input once patient is stable. 7. COPD not actively wheezing. 8. Chronic  systolic heart failure last EF measured was in April 2020 which showed EF of 20 to 25%.  Holding Lasix or any diuretics due to low normal blood pressure septic picture. 9. History of previous stroke on apixaban which is on hold due to subdural hematoma patient's daughter agreeable. 10. Multiple skin wounds will need wound team consult. 11. History of dysphagia secondary to stroke.  COVID-19 test is pending. Given the septic-like picture with subdural hematoma and A. fib with RVR will need more than 2 midnight stay and inpatient status.  At this time patient is receiving Kcentra for reversing apixaban.  Once this is done we will repeat labs including TSH cardiac markers metabolic panel CBC CK.   DVT prophylaxis: SCDs due to active bleed. Code Status: DNR confirmed with patient's daughter. Family Communication: Patient's daughter. Disposition Plan: To be determined. Consults  called: Neurosurgeon Dr. Lacinda Axon. Admission status: Inpatient.   Rise Patience MD [facility] Pager [phone].  If 7PM-7AM, please contact night-coverage [email or web address] Password [email or web address]  05/28/2019, 2:27 AM

## 2019-05-28 NOTE — ED Notes (Signed)
Pt states she took her telotristat ethyl at 9am

## 2019-05-28 NOTE — Consult Note (Addendum)
Referring Physician:  No referring provider defined for this encounter.  Primary Physician:  Adin Hector, MD  Chief Complaint:  Fall, weakness  History of Present Illness: Veronica Wang is a 83 y.o. female who presents as an ED consult for subdural hematoma. Daughter present in room during evaluation. She has been experiencing multiple falls over the last few weeks without any known reason. After initial fall she began experiencing progressive weakness. Complains of minimal headache in occipital region. Also complains of vision changes in the morning but this has been present for awhile as well as left lower extremity weakness. Symptoms began shortly after initial fall. Denies speech changes, cognitive changes, behavioral changes, upper extremity weakness, extremity numbness.   Review of Systems:  A 10 point review of systems is negative, except for the pertinent positives and negatives detailed in the HPI.  Past Medical History: Past Medical History:  Diagnosis Date  . Abnormal MRI 09/10/2015  . Atrial fibrillation ([place])   . Cancer ([place])    Liver  . Cancer ([place])    stomach  . Carcinoid tumor of intestine   . CHF (congestive heart failure) ([place])   . COPD (chronic obstructive pulmonary disease) ([place])   . FH: chemotherapy   . GERD (gastroesophageal reflux disease)   . Hyperlipidemia   . Hypertension   . Macular degeneration   . Polycythemia   . Radiation     Past Surgical History: Past Surgical History:  Procedure Laterality Date  . CATARACT EXTRACTION  splenectomy  . COLON RESECTION    . COLON SURGERY    . GASTRECTOMY    . SPLENECTOMY      Allergies: Allergies as of 05/14/2019 - Review Complete 05/16/2019  Allergen Reaction Noted  . No known allergies  11/10/2014    Medications:  Current Facility-Administered Medications:  .  acetaminophen (TYLENOL) tablet 650 mg, 650 mg, Oral, Q6H PRN **OR** acetaminophen (TYLENOL) suppository 650 mg, 650 mg, Rectal, Q6H  PRN, Rise Patience, MD .  albuterol (PROVENTIL) (2.5 MG/3ML) 0.083% nebulizer solution 2.5 mg, 2.5 mg, Inhalation, Q6H PRN, Rise Patience, MD .  ceFEPIme (MAXIPIME) 2 g in sodium chloride 0.9 % 100 mL IVPB, 2 g, Intravenous, Q12H, Rise Patience, MD .  labetalol (NORMODYNE) injection 10 mg, 10 mg, Intravenous, Q4H PRN, [place], Bing Neighbors, NP .  ondansetron (ZOFRAN) tablet 4 mg, 4 mg, Oral, Q6H PRN **OR** ondansetron (ZOFRAN) injection 4 mg, 4 mg, Intravenous, Q6H PRN, Rise Patience, MD .  pantoprazole (PROTONIX) EC tablet 40 mg, 40 mg, Oral, Daily, Gean Birchwood N, MD .  sodium chloride flush (NS) 0.9 % injection 10-40 mL, 10-40 mL, Intracatheter, Q12H, Rise Patience, MD, 10 mL at 05/28/19 0018 .  sodium chloride flush (NS) 0.9 % injection 10-40 mL, 10-40 mL, Intracatheter, PRN, Rise Patience, MD .  Telotristat Ethyl(as Etiprate) TABS 1 tablet, 1 tablet, Oral, TID, Rise Patience, MD .  Derrill Memo ON 05/29/2019] vancomycin (VANCOCIN) IVPB 750 mg/150 ml premix, 750 mg, Intravenous, Q36H, Rise Patience, MD  Current Outpatient Medications:  .  albuterol (PROVENTIL HFA;VENTOLIN HFA) 108 (90 BASE) MCG/ACT inhaler, Inhale 2 puffs into the lungs every 6 (six) hours as needed for wheezing or shortness of breath., Disp: , Rfl:  .  ELIQUIS 2.5 MG TABS tablet, Take 2.5 mg by mouth 2 (two) times daily. , Disp: , Rfl:  .  furosemide (LASIX) 20 MG tablet, Take 20 mg by mouth daily as needed for edema. ,  Disp: , Rfl:  .  LANREOTIDE ACETATE [place], Inject 1 Dose into the skin every 14 (fourteen) days. , Disp: , Rfl:  .  loperamide (IMODIUM A-D) 2 MG tablet, Take 4 mg by mouth 4 (four) times daily as needed for diarrhea or loose stools., Disp: , Rfl:  .  pantoprazole (PROTONIX) 40 MG tablet, Take 1 tablet (40 mg total) by mouth daily., Disp: 30 tablet, Rfl: 11 .  Telotristat Etiprate 250 MG TABS, Take 1 tablet by mouth 3 (three) times daily., Disp: , Rfl:    Facility-Administered Medications Ordered in Other Encounters:  .  0.9 %  sodium chloride infusion, , Intravenous, Continuous, [place], Wandra Feinstein, NP, Stopped at 09/17/17 1630   Social History: Social History   Tobacco Use  . Smoking status: Former Smoker    Types: Cigarettes  . Smokeless tobacco: Never Used  Substance Use Topics  . Alcohol use: No    Alcohol/week: 0.0 standard drinks  . Drug use: No    Family Medical History: Family History  Problem Relation Age of Onset  . Heart attack Sister   . Heart attack Brother   . Leukemia Mother     Physical Examination: Vitals:   05/28/19 0713 05/28/19 0716  BP: 97/62 101/73  Pulse: (!) 56   Resp: 18 18  Temp:    SpO2: 100%      General: Patient is well developed, well nourished, calm, collected, and in no apparent distress.  Psychiatric: Patient is non-anxious.  Head:  Pupils equal, round, and reactive to light.  ENT:  Oral mucosa appears well hydrated.  Neck:   Supple.  Full range of motion.  Respiratory: Patient is breathing without any difficulty.  Extremities: No edema.  Skin:   On exposed skin, there are no abnormal skin lesions.  NEUROLOGICAL:  General: In no acute distress.   Awake, alert, oriented to person, place, and time. EOMI, pupils equal, round, reactive Speech fluent and clear. Mask present but no evidence of facial drooping Able to answer questions appropriately   Strength: Side Biceps Triceps Grip  R 5 5 5   L 5 5 5    Side Iliopsoas Quads DF  R 5 5 5   L 3 3 5   Bilateral upper and lower extremity sensation is intact to light touch.  Gait not assessed.   Imaging: EXAM: CT HEAD WITHOUT CONTRAST  05/28/2019 01:12  IMPRESSION: 1. Left frontoparietal subdural collection measures 6 mm, likely subacute. This is new from exam 3 weeks ago. No underlying mass effect or midline shift. 2. Stable degree of generalized atrophy and chronic small vessel ischemia. Encephalomalacia in the  left parietooccipital lobe.   EXAM: CT HEAD WITHOUT CONTRAST   IMPRESSION: Atrophy with small vessel chronic ischemic changes of deep cerebral white matter.  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  Stable small subacute LEFT frontoparietal subdural hematoma measuring up to 6 mm thick.  No new intracranial abnormalities.  05/28/2019 08:09  Assessment and Plan: Ms. Avella is a pleasant 83 y.o. female with 6 mm left frontoparietal subdural hemorrhage.  Exam was brief due to CT getting ready to take her down for repeat head CT.  Neurologically intact except for diffuse left lower extremity weakness that she states has been present since initial fall about 3 weeks ago.  Will await CT results to determine appropriate plan.  Marin Olp, PA-C Dept. of Neurosurgery   Attending: Repeat Head CT is stable.  Continue to hold antiplatelet therapy and anticoagulation.  Admission per Medicine for  weakness.  Will follow up for intracranial hemorrhage after discharge - no acute surgical intervention recommended.  Meade Maw MD

## 2019-05-28 NOTE — Consult Note (Signed)
[facility] Nurse Consult Note: Reason for Consult: multiple wounds Wound type: 1. Forehead laceration (old) 2. Facial laceration (old)  3. Left wrist skin tear (fall)  4. Left wrist skin tear (fall) 5. Left knee skin tear (fall) 6. Right knee skin tear (fall) 7. Right knee skin tear (fall) 8. Right elbow skin tear (fall) 9. Right LE full thickness ulceration lateral calf 10. Right LE full thickness ulceration posterior calf 11. Right heel deep tissue pressure injury 12. Left heel deep tissue pressure injury 13. Left posterior calf full thickness ulceration 14. Left great toe at first met head; medical device related pressure injury 15. Left dorsal foot; medical device related pressure injury 16. Right  dorsal great toe; medical device related pressure injury 17. Left pretibial; medical device related pressure injury 18. Left pretibial just below patella; medical device related pressure injury  Pressure Injury POA: Yes  Measurement: 1. Forehead laceration (old) crusted did not measure  2. Facial laceration (old) aprox. 2cm  3. Left wrist skin tear (fall)2.5cm x 2.0cm x 0.1cm  4. Left wrist skin tear (fall) 1cm x 0.5cm x 0.1cm  5. Left knee skin tear (fall)5cm x 2.0cm x 0.1cm  6. Right knee skin tear (fall) 2cm x 3cm x 0.1cm  7. Right knee skin tear (fall) 1cm x 1cm x 0.1cm  8. Right elbow skin tear (fall)1cm x 1cm x 0.1cm  9. Right LE full thickness ulceration lateral calf 7cm x 2cm x 0.1cm  10. Right LE full thickness ulceration posterior calf 25cm x 10cm x 0.1cm  11. Right heel deep tissue pressure injury 5cm x 2.5cm 0cm  12. Left heel deep tissue pressure injury 4cm x 4.5cm x 0cm  13. Left posterior calf full thickness ulceration 2cm x 0.7cm x 0.1cm  14. Left great toe medial at first met head; medical device related pressure injury 0.3cm x 0.3cm x 0cm  15. Left dorsal foot; medical device related pressure injury 0.3cm x 0.3cm x 0cm  16. Right  dorsal great toe; medical device  related pressure injury 1.5cm x 1.0cm x 0cm  17. Left pretibial; medical device related pressure injury scattered along bony prominence  18. Left pretibial just below patella; medical device related pressure injury 0,4cm x 0.3cm x0cm   Wound bed: 1. Forehead laceration (old) crusted 2. Facial laceration (old) closed with steri strips 3. Left wrist skin tear (fall)dry, pink 4. Left wrist skin tear (fall)dry, pink 5. Left knee skin tear (fall) dry, pink 6. Right knee skin tear (fall) dry, pink 7. Right knee skin tear (fall)dry,. pink 8. Right elbow skin tear (fall) dry, pink 9. Right LE full thickness ulceration lateral calf; thick drainage, no odor, 100% clean, moist 10. Right LE full thickness ulceration posterior calf minimal drainage, no odor, 100% clean, moist  11. Right heel deep tissue pressure injury 100% dark purple non blanchable  12. Left heel deep tissue pressure injury 100% dark purple non blanchable 13. Left posterior calf full thickness ulceration mostly healed; one area that is open clean and pink 14. Left great toe at first met head; medical device related pressure injury dark purple, non blanchable 15. Left dorsal foot; medical device related pressure injury dark purple non blanchable  16. Right  dorsal great toe; medical device related pressure injury dark purple non blanchable  17. Left pretibial; medical device related pressure injury dark purple non blanchable  18. Left pretibial just below patella; medical device related pressure injury dark purple non blanchable   Drainage (amount, consistency,  odor)  . Forehead laceration (old) 2. Facial laceration (old) 3. Left wrist skin tear (fall) 4. Left wrist skin tear (fall) 5. Left knee skin tear (fall) 6. Right knee skin tear (fall) 7. Right knee skin tear (fall) 8. Right elbow skin tear (fall). No drainage 9. Right LE full thickness ulceration lateral calf see above 10. Right LE full thickness ulceration posterior calf see  above 11. Right heel deep tissue pressure injury 12. Left heel deep tissue pressure injury 13. Left posterior calf full thickness ulceration 14. Left great toe at first met head; medical device related pressure injury 15. Left dorsal foot; medical device related pressure injury 16. Right  dorsal great toe; medical device related pressure injury 17. Left pretibial; medical device related pressure injury 18. Left pretibial just below patella; medical device related pressure injury.  No drainage   Periwound: intact  Dressing procedure/placement/frequency:  . Forehead laceration (old) antibiotic ointment only 2. Facial laceration (old) steri strips leave in place until fall off 3. Left wrist skin tear (fall) xeroform 4. Left wrist skin tear (fall) xeroform 5. Left knee skin tear (fall) xeroform 6. Right knee skin tear (fall) xeroform 7. Right knee skin tear (fall) xeroform 8. Right elbow skin tear (fall) xeroform  This will protect, insulate and keep the wounds moist for healing as non adherent   9. Right LE full thickness ulceration lateral calf 10. Right LE full thickness ulceration posterior calf Covered both with xeroform for now, needs vascular MD to evaluate that follows this patient.  OK to continue, as dressings in place were stuck to the patient and painful to remove. Additionally did not absorb drainage well  11. Right heel deep tissue pressure injury 12. Left heel deep tissue pressure injury 13. Left posterior calf full thickness ulceration 14. Left great toe at first met head; medical device related pressure injury 15. Left dorsal foot; medical device related pressure injury 16. Right  dorsal great toe; medical device related pressure injury 17. Left pretibial; medical device related pressure injury 18. Left pretibial just below patella; medical device related pressure injury Explained to patient and daughter this was from the Unna's boots, need to pad the areas that are bony  prior to wrapping.   Heels must be floated at all times, explained pressure injuries can happen in as little as 2 hours with immobile patient.  Additionally in the presence of compression wraps unable to assess Prevalon boots ordered bilaterally for offloading, explained importance to patient, bedside nurse, and family Low air loss mattress ordered for high risk patient who will and has been in ED for more than 24 hours.    Please have vascular evaluate bilateral LE and heel; they follow her as outpatient; wraps are causing pressure injuries; limited mobility causing heel pressure injuries Would not recommend replacement, heel pressure injuries can not be assessed.   Discussed POC with patient and bedside nurse.  Re consult if needed, will not follow at this time. Thanks  Jacoria Keiffer [employer], RN,CWOCN, CNS, [place] [phone])

## 2019-05-28 NOTE — Progress Notes (Signed)
Pharmacy Antibiotic Note  Veronica Wang is a 83 y.o. female admitted on 06/09/2019 with bacteremia.  Pharmacy has been consulted for Levaquin dosing. Pt grew out GNR in 2 of 2 aerobic bottles but the BCID did not recognize the species. Lab said bacteria was "odd looking"  Blood cx have been sent to [facility] for further ID.   Spoke with Dr Manuella Ghazi ... agreed to d/c vanc and cefepime and begin levaquin to expand Gram negative coverage.   Plan: Will start levaquin 750 mg IV Q48H.   Height: 4\' 11"  (149.9 cm) Weight: 80 lb 11 oz (36.6 kg) IBW/kg (Calculated) : 43.2  Temp (24hrs), Avg:99.6 F (37.6 C), Min:99.6 F (37.6 C), Max:99.6 F (37.6 C)  Recent Labs  Lab 06/08/2019 2011 05/15/2019 2142 05/28/19 0016  WBC 26.5*  --   --   CREATININE 0.84  --   --   LATICACIDVEN  --  3.1* 2.5*    Estimated Creatinine Clearance: 30.3 mL/min (by C-G formula based on SCr of 0.84 mg/dL).    No Known Allergies  Antimicrobials this admission:   >>    >>   Dose adjustments this admission:   Microbiology results:  BCx:   UCx:    Sputum:    MRSA PCR:   Thank you for allowing pharmacy to be a part of this patient's care.  Veronica Wang D 05/28/2019 5:37 PM

## 2019-05-29 DIAGNOSIS — R6510 Systemic inflammatory response syndrome (SIRS) of non-infectious origin without acute organ dysfunction: Secondary | ICD-10-CM

## 2019-05-29 DIAGNOSIS — Z7189 Other specified counseling: Secondary | ICD-10-CM

## 2019-05-29 DIAGNOSIS — Z515 Encounter for palliative care: Secondary | ICD-10-CM

## 2019-05-29 MED ORDER — SODIUM CHLORIDE 0.9 % IV SOLN
2.0000 g | INTRAVENOUS | Status: DC
  Administered 2019-05-29: 2 g via INTRAVENOUS
  Filled 2019-05-29: qty 20
  Filled 2019-05-29: qty 2
  Filled 2019-05-29: qty 20

## 2019-05-29 MED ORDER — ENSURE ENLIVE PO LIQD: 237.0000 mL | Freq: Two times a day (BID) | ORAL | Status: DC

## 2019-05-29 NOTE — Consult Note (Addendum)
Consultation Note Date: 05/29/2019   Patient Name: Veronica Wang  DOB: 1935/10/31  MRN: [id number]  Age / Sex: 83 y.o., female  PCP: Adin Hector, MD Referring Physician: Lavina Hamman, MD  Reason for Consultation: Establishing goals of care  HPI/Patient Profile: Veronica Wang is a 83 y.o. female with history of metastatic carcinoid being followed at [facility], stroke, chronic systolic heart failure last EF measured in April 2020 was 20 to 25%, A. fib on Eliquis, protein calorie malnutrition multiple wounds was brought to the ER the patient was getting increasingly weak with multiple falls.  Patient's daughter who provided most of the history states that patient has progressively got weak since he got discharged last month from the hospital.  She has been a multiple falls.  But multiple members of the family had gone to visit patient at her apartment where she lives alone she was found to be on the floor on multiple occasions.  Due to ongoing weakness patient was brought to the ER.   Clinical Assessment and Goals of Care: Patient is resting in bed eating breakfast, which she ate around 15- 20% of before pushing it away. She states she usually picks over her food. Bruising and steristrips to her face from a previous fall. She appears weak and frail. She states she lives alone, but realizes that she will not be able to live alone much longer. Her daughter helps her. She states she is weak, but hopes she can improve enough to go home and live herself, and if not, she states she would go to live with her daughter.   We discussed her diagnoses, prognosis, GOC, EOL wishes disposition and options. She is aware of her status and states she understands she does not have a lot of time left.   The difference between an aggressive medical intervention path and a comfort care path was discussed.  Values and  goals of care important to patient and family were attempted to be elicited. She is followed by outpatient palliative care and has had [place] discussions. She understands the recommendation for hospice and until now has not accepted hospice because of injections that she has been receiving. Discussed limitations of medical interventions to prolong quality of life in some situations and discussed the concept of human mortality.  She states she is very torn and does not know what she wants to do moving forward in regards to her healthcare or living arrangements as this feels like it is all happening so quickly. She notes rapid decline after her fall a month ago. She states her goal has been to live long enough to see her grandson graduate, "and then I'm ready to go, I'm tired." She understands she may not live to see this goal. She is weighing the need to spend what time she has left with her family, with her recurrent doctors visits/hospital admissions, and visitor restrictions 2/2 covid. She understands that the time she is admitted into the hospital is time she is not  able to spend with her grandson knowing time is likely very limited. She states she needs time to think, and talk with her daughter. She is unsure if she wants to continue on her current path or transition to hospice. We discussed concern for trajectory of continued hospitalizations and continuing decline, as well as additional falls. She states she needs to" decide at what point my body has had enough".   Daughter states "I'm not ready just to let her go to hospice, but I will do what she wants". She would like for her to come home to live with her. She will speak with her mother about hospice.    SUMMARY OF RECOMMENDATIONS   Family to decide hospice vs palliative. Family meeting tomorrow at 1:30.   Prognosis:   < 2 weeks If stopping antibiotics. Subacute SDH, though new from last imaging 3 weeks ago. GNR bacteremia, A-fib with RVR,  malnutrition: Albumin 2.2.  Holding Apixaban prescribed for a previous stroke due to new subacute SDH.         Primary Diagnoses: Present on Admission: . SIRS (systemic inflammatory response syndrome) (HCC) . Atrial fibrillation with RVR ([place]) . Carcinoid tumor of intestine . HTN (hypertension) . COPD (chronic obstructive pulmonary disease) ([place]) . Subdural hematoma ([place])   I have reviewed the medical record, interviewed the patient and family, and examined the patient. The following aspects are pertinent.  Past Medical History:  Diagnosis Date  . Abnormal MRI 09/10/2015  . Atrial fibrillation ([place])   . Cancer ([place])    Liver  . Cancer ([place])    stomach  . Carcinoid tumor of intestine   . CHF (congestive heart failure) ([place])   . COPD (chronic obstructive pulmonary disease) ([place])   . FH: chemotherapy   . GERD (gastroesophageal reflux disease)   . Hyperlipidemia   . Hypertension   . Macular degeneration   . Polycythemia   . Radiation    Social History   Socioeconomic History  . Marital status: Divorced    Spouse name: Not on file  . Number of children: Not on file  . Years of education: Not on file  . Highest education level: Not on file  Occupational History  . Not on file  Social Needs  . Financial resource strain: Not on file  . Food insecurity    Worry: Not on file    Inability: Not on file  . Transportation needs    Medical: Not on file    Non-medical: Not on file  Tobacco Use  . Smoking status: Former Smoker    Types: Cigarettes  . Smokeless tobacco: Never Used  Substance and Sexual Activity  . Alcohol use: No    Alcohol/week: 0.0 standard drinks  . Drug use: No  . Sexual activity: Not Currently    Birth control/protection: Post-menopausal  Lifestyle  . Physical activity    Days per week: Not on file    Minutes per session: Not on file  . Stress: Not on file  Relationships  . Social Herbalist on phone: Not on file    Gets together:  Not on file    Attends religious service: Not on file    Active member of club or organization: Not on file    Attends meetings of clubs or organizations: Not on file    Relationship status: Not on file  Other Topics Concern  . Not on file  Social History Narrative   Lives alone. Daughter Judeen Hammans assists  as needed.   Family History  Problem Relation Age of Onset  . Heart attack Sister   . Heart attack Brother   . Leukemia Mother    Scheduled Meds: . neomycin-bacitracin-polymyxin   Topical Daily  . pantoprazole  40 mg Oral Daily  . sodium chloride flush  10-40 mL Intracatheter Q12H  . Telotristat Ethyl(as Etiprate)  1 tablet Oral TID   Continuous Infusions: . sodium chloride Stopped (05/29/19 0110)  . levofloxacin (LEVAQUIN) IV Stopped (05/29/19 0101)   PRN Meds:.sodium chloride, acetaminophen **OR** acetaminophen, albuterol, labetalol, loperamide, ondansetron **OR** ondansetron (ZOFRAN) IV, sodium chloride flush Medications Prior to Admission:  Prior to Admission medications   Medication Sig Start Date End Date Taking? Authorizing Provider  albuterol (PROVENTIL HFA;VENTOLIN HFA) 108 (90 BASE) MCG/ACT inhaler Inhale 2 puffs into the lungs every 6 (six) hours as needed for wheezing or shortness of breath.   Yes [provider]  ELIQUIS 2.5 MG TABS tablet Take 2.5 mg by mouth 2 (two) times daily.  04/09/19  Yes [provider]  furosemide (LASIX) 20 MG tablet Take 20 mg by mouth daily as needed for edema.    Yes [provider]  LANREOTIDE ACETATE [place] Inject 1 Dose into the skin every 14 (fourteen) days.    Yes [provider]  loperamide (IMODIUM A-D) 2 MG tablet Take 4 mg by mouth 4 (four) times daily as needed for diarrhea or loose stools.   Yes [provider]  pantoprazole (PROTONIX) 40 MG tablet Take 1 tablet (40 mg total) by mouth daily. 05/03/19 05/02/20 Yes Dustin Flock, MD  Telotristat Etiprate 250 MG TABS Take 1 tablet by mouth  3 (three) times daily. 05/27/18  Yes [provider]   No Known Allergies Review of Systems  Psychiatric/Behavioral: The patient is nervous/anxious.     Physical Exam Pulmonary:     Effort: Pulmonary effort is normal.  Skin:    General: Skin is warm and dry.  Neurological:     Mental Status: She is alert.     Comments: Answering questions appropriately with appropriate thought content     Vital Signs: BP (!) 81/57 (BP Location: Left Arm)   Pulse 98   Temp 98.7 F (37.1 C)   Resp 17   Ht 4\' 11"  (1.499 m)   Wt 36.6 kg   SpO2 93%   BMI 16.30 kg/m  Pain Scale: 0-10   Pain Score: 6    SpO2: SpO2: 93 % O2 Device:SpO2: 93 % O2 Flow Rate: .O2 Flow Rate (L/min): 3 L/min  IO: Intake/output summary:   Intake/Output Summary (Last 24 hours) at 05/29/2019 0941 Last data filed at 05/29/2019 0112 Gross per 24 hour  Intake 382.17 ml  Output -  Net 382.17 ml    LBM: Last BM Date: 05/29/19 Baseline Weight: Weight: 36.6 kg Most recent weight: Weight: 36.6 kg     Palliative Assessment/Data:     Time In: 9:20 Time Out: 11:20 Time Total: 120 min Greater than 50%  of this time was spent counseling and coordinating care related to the above assessment and plan.  Signed by: Asencion Gowda, NP   Please contact Palliative Medicine Team phone at [phone] for questions and concerns.  For individual provider: See Shea Evans

## 2019-05-29 NOTE — Progress Notes (Signed)
[facility] Progress Note  Patient: Veronica Wang [id number]   PCP: Adin Hector, MD DOB: [birth year]   DOA: 05/20/2019   DOS: 05/29/2019   Date of Service: the patient was seen and examined on 05/29/2019  Chief Complaint  Patient presents with  . Fall   Brief hospital course: Pt. with PMH of metastatic carcinoid being followed at [facility], stroke, chronic systolic heart failure last EF measured in April 2020 was 20 to 25%, A. fib on Eliquis, protein calorie malnutrition multiple wounds ; presented with complain of weakness and fall, was found to have subdural hematoma.  Also found to have gram-negative rod bacteremia  Currently further plan is conservative measures.  Family is leaning toward comfort measures.  Continuing antibiotics for now.  Subjective: Denies any acute complaint other than generalized weakness and fatigue.  No chest pain abdominal pain.  No dizziness or lightheadedness.  Assessment and Plan: Scheduled Meds: . feeding supplement (ENSURE ENLIVE)  237 mL Oral BID BM  . neomycin-bacitracin-polymyxin   Topical Daily  . pantoprazole  40 mg Oral Daily  . sodium chloride flush  10-40 mL Intracatheter Q12H  . Telotristat Ethyl(as Etiprate)  1 tablet Oral TID   Continuous Infusions: . sodium chloride Stopped (05/29/19 0110)  . cefTRIAXone (ROCEPHIN)  IV 2 g (05/29/19 1558)   PRN Meds: sodium chloride, acetaminophen **OR** acetaminophen, albuterol, labetalol, loperamide, ondansetron **OR** ondansetron (ZOFRAN) IV, sodium chloride flush   1. GNR Bacteremia/Sepsis.  Patient has multiple wounds which could potentially be a source.  Based on blood c/s growth of GNR - Abx changed to Levaquin, now to ceftriaxone. Cautiously hydrate given that patient has systolic heart failure.  2. Subacute subdural hematoma on the left frontoparietal area -  Dr. Lacinda Axon on-call neurosurgeon who advised at this time to give reversal agent for apixaban and repeat CT head  in 6 hours not showing any extension of bleed.  Discussed with patient's daughter about the plan also discussed that patient has risk for stroke that we are discontinuing apixaban and reversing agent.  Patient's daughter agreeable.  She would like to keep her comfortable and transition to Hospice if she qualifies  3. A. fib with RVR  1 dose of 2.5 mg IV metoprolol has been given. Closely observe on off unit tele.  Holding off apixaban due to subdural hematoma. Patient's daughter agreeable.  4. Hypertension  remains hypotensive due to sepsis  5. History of carcinoid syndrome to continue home medications. Continues to have diarrhea. Monitor.  6. Severe protein calorie malnutrition -underweight Poor po intake Dietary consulted.  7. COPD not actively wheezing.  8. Chronic systolic heart failure last EF measured was in April 2020 which showed EF of 20 to 25%.  Holding Lasix or any diuretics due to low normal blood pressure septic picture.  10. History of previous stroke on apixaban which is on hold due to subdural hematoma patient's daughter agreeable.  11. History of dysphagia secondary to stroke.  12. Multiple skin wounds - appreciate wound team consult. See below wound listed per wound care nurse which I agree with, all present on admission  Pressure Injury 05/28/19 Sacrum Stage II -  Partial thickness loss of dermis presenting as a shallow open ulcer with a red, pink wound bed without slough. (Active)  05/28/19 1910  Location: Sacrum  Location Orientation:   Staging: Stage II -  Partial thickness loss of dermis presenting as a shallow open ulcer with a red, pink wound bed without slough.  Wound Description (Comments):   Present on Admission:      Diet: Regular diet  DVT Prophylaxis: SCD, pharmacological prophylaxis contraindicated due to SDH   Advance goals of care discussion: DNR  Family Communication: no family was present at bedside, at the time of interview.    Disposition:  Discharge to be determined.  Consultants: Neurosurgery, palliative care Procedures: none  Antibiotics: Anti-infectives (From admission, onward)   Start     Dose/Rate Route Frequency Ordered Stop   05/29/19 2300  vancomycin (VANCOCIN) IVPB 750 mg/150 ml premix  Status:  Discontinued     750 mg 150 mL/hr over 60 Minutes Intravenous Every 36 hours 05/28/19 0522 05/28/19 1733   05/29/19 1400  cefTRIAXone (ROCEPHIN) 2 g in sodium chloride 0.9 % 100 mL IVPB     2 g 200 mL/hr over 30 Minutes Intravenous Every 24 hours 05/29/19 1240     05/28/19 1800  levofloxacin (LEVAQUIN) IVPB 750 mg  Status:  Discontinued     750 mg 100 mL/hr over 90 Minutes Intravenous Every 48 hours 05/28/19 1736 05/29/19 1240   05/28/19 1000  ceFEPIme (MAXIPIME) 2 g in sodium chloride 0.9 % 100 mL IVPB  Status:  Discontinued     2 g 200 mL/hr over 30 Minutes Intravenous Every 12 hours 05/28/19 0522 05/28/19 1734   06/09/2019 2230  ceFEPIme (MAXIPIME) 2 g in sodium chloride 0.9 % 100 mL IVPB     2 g 200 mL/hr over 30 Minutes Intravenous  Once 05/16/2019 2220 06/07/2019 2340   06/09/2019 2230  vancomycin (VANCOCIN) IVPB 1000 mg/200 mL premix     1,000 mg 200 mL/hr over 60 Minutes Intravenous  Once 05/20/2019 2220 05/28/19 0055       Objective: Physical Exam: Vitals:   05/28/19 1830 05/28/19 2014 05/29/19 0428 05/29/19 1442  BP: 96/71 (!) 83/57 (!) 81/57 96/61  Pulse: (!) 112 98 98 80  Resp:  17 17 18   Temp:  98.6 F (37 C) 98.7 F (37.1 C) 97.7 F (36.5 C)  TempSrc:    Oral  SpO2: 98% 100% 93% 98%  Weight:      Height:        Intake/Output Summary (Last 24 hours) at 05/29/2019 1834 Last data filed at 05/29/2019 1002 Gross per 24 hour  Intake 512.17 ml  Output -  Net 512.17 ml   Filed Weights   06/08/2019 1955 05/26/2019 2015  Weight: 36.6 kg 36.6 kg   General: alert and oriented to time and place. Appear in mild distress, affect appropriate Eyes: PERRL, Conjunctiva normal ENT: Oral Mucosa  Clear, moist  Neck: no JVD, no Abnormal Mass Or lumps Cardiovascular: S1 and S2 Present, no Murmur,  Respiratory: good respiratory effort, Bilateral Air entry equal and Decreased, no signs of accessory muscle use, Clear to Auscultation, no Crackles, no wheezes Abdomen: Bowel Sound present, Soft and no tenderness, no hernia Skin: no rashes  Extremities: no Pedal edema, no calf tenderness Neurologic: without any new focal findings Gait not checked due to patient safety concerns  Data Reviewed: I have personally reviewed and interpreted daily labs, tele strips, imagings as discussed above. I reviewed all nursing notes, pharmacy notes, vitals, pertinent old records I have discussed plan of care as described above with RN and patient/family.  CBC: Recent Labs  Lab 05/13/2019 2011  WBC 26.5*  NEUTROABS 24.4*  HGB 13.2  HCT 41.6  MCV 78.6*  PLT [id number]   Basic Metabolic Panel: Recent Labs  Lab 06/02/2019 2011  NA  140  K 4.4  CL 101  CO2 27  GLUCOSE 124*  BUN 27*  CREATININE 0.84  CALCIUM 8.4*    Liver Function Tests: Recent Labs  Lab 05/26/2019 2011  AST 33  ALT 20  ALKPHOS 188*  BILITOT 1.0  PROT 5.9*  ALBUMIN 2.2*   No results for input(s): LIPASE, AMYLASE in the last 168 hours. No results for input(s): AMMONIA in the last 168 hours. Coagulation Profile: No results for input(s): INR, PROTIME in the last 168 hours. Cardiac Enzymes: No results for input(s): CKTOTAL, CKMB, CKMBINDEX, TROPONINI in the last 168 hours. BNP (last 3 results) No results for input(s): PROBNP in the last 8760 hours. CBG: No results for input(s): GLUCAP in the last 168 hours. Studies: No results found.   Time spent: 35 minutes  Author: Berle Mull, MD [facility] 05/29/2019 6:34 PM  To reach On-call, see care teams to locate the attending and reach out to them via [email or web address]. If 7PM-7AM, please contact night-coverage If you still have difficulty reaching the attending provider,  please page the [facility] (Director on Call) for [facility] on amion for assistance.

## 2019-05-29 NOTE — Progress Notes (Signed)
Initial Nutrition Assessment  RD working remotely.  DOCUMENTATION CODES:   Underweight  INTERVENTION:  Provide Ensure Enlive po BID, each supplement provides 350 kcal and 20 grams of protein.  Provide Magic cup TID with meals, each supplement provides 290 kcal and 9 grams of protein.  Consider SLP consult in setting of hx of dysphagia secondary to previous CVA.  NUTRITION DIAGNOSIS:   Inadequate oral intake related to decreased appetite as evidenced by meal completion < 50%.  GOAL:   Patient will meet greater than or equal to 90% of their needs  MONITOR:   PO intake, Supplement acceptance, Labs, Weight trends, Skin, I & O's  REASON FOR ASSESSMENT:   Consult Wound healing  ASSESSMENT:   83 year old female with PMHx of HTN, GERD, metastatic carcinoid, hx CVA, COPD, CHF, HLD, A-fib admitted with GNR bacteremia/sepsis, subacute SDH.   Attempted to call patient over the phone for nutrition/weight history but she was unable to answer. Patient is known to our service from previous admissions. She has previously met criteria for severe malnutrition and RD suspects patient would still meet criteria for malnutrition but unable to confirm without history or completing NFPE. Patient ate 40% of dinner last night and 25% of breakfast this morning according to chart. Plan is for family meeting tomorrow to discuss goals of care. Weights in chart are fluctuating between 29-39 kg over the past year so unsure how accurate they are. Patient currently documented as weighing 36.6 kg (80.69 lbs).   Medications reviewed and include: pantoprazole, ceftriaxone.  Labs reviewed: BUN 27.  NUTRITION - FOCUSED PHYSICAL EXAM:  Unable to complete at this time.  Diet Order:   Diet Order            DIET SOFT Room service appropriate? Yes; Fluid consistency: Thin; Fluid restriction: 1200 mL Fluid  Diet effective now             EDUCATION NEEDS:   No education needs have been identified at this  time  Skin:  Skin Assessment: Skin Integrity Issues:(stg II sacrum)  Last BM:  05/29/2019 - medium type 7  Height:   Ht Readings from Last 1 Encounters:  05/31/2019 _0  (1.499 m)   Weight:   Wt Readings from Last 1 Encounters:  06/09/2019 36.6 kg   Ideal Body Weight:  43.2 kg  BMI:  Body mass index is 16.3 kg/m.  Estimated Nutritional Needs:   Kcal:  1200-1400  Protein:  60-70 grams  Fluid:  1.2-1.4 L/day  Jacklynn Barnacle, MS, RD, LDN Office: [phone] Pager: [phone] After Hours/Weekend Pager: [phone]

## 2019-05-29 NOTE — Consult Note (Signed)
Charmian Muff Referring Physician:  No referring provider defined for this encounter.  Primary Physician:  Veronica Hector, MD  Chief Complaint:  Fall, weakness  History of Present Illness: Interval history 05/29/2019 Evaluated this AM with Dr. Izora Wang. No changes in complaints noted at this time.   Initial HPI on 05/28/2019 Veronica Wang is a 83 y.o. female who presents as an ED consult for subdural hematoma. Daughter present in room during evaluation. She has been experiencing multiple falls over the last few weeks without any known reason. After initial fall she began experiencing progressive weakness. Complains of minimal headache in occipital region. Also complains of vision changes in the morning but this has been present for awhile as well as left lower extremity weakness. Symptoms began shortly after initial fall. Denies speech changes, cognitive changes, behavioral changes, upper extremity weakness, extremity numbness.   Review of Systems:  A 10 point review of systems is negative, except for the pertinent positives and negatives detailed in the HPI.  Past Medical History: Past Medical History:  Diagnosis Date  . Abnormal MRI 09/10/2015  . Atrial fibrillation ([place])   . Cancer ([place])    Liver  . Cancer ([place])    stomach  . Carcinoid tumor of intestine   . CHF (congestive heart failure) ([place])   . COPD (chronic obstructive pulmonary disease) ([place])   . FH: chemotherapy   . GERD (gastroesophageal reflux disease)   . Hyperlipidemia   . Hypertension   . Macular degeneration   . Polycythemia   . Radiation     Past Surgical History: Past Surgical History:  Procedure Laterality Date  . CATARACT EXTRACTION  splenectomy  . COLON RESECTION    . COLON SURGERY    . GASTRECTOMY    . SPLENECTOMY      Allergies: Allergies as of 05/26/2019 - Review Complete 05/23/2019  Allergen Reaction Noted  . No known allergies  11/10/2014    Medications:  Current Facility-Administered  Medications:  .  0.9 %  sodium chloride infusion, , Intravenous, PRN, Max Sane, MD, Stopped at 05/29/19 0110 .  acetaminophen (TYLENOL) tablet 650 mg, 650 mg, Oral, Q6H PRN, 650 mg at 05/28/19 2037 **OR** acetaminophen (TYLENOL) suppository 650 mg, 650 mg, Rectal, Q6H PRN, Rise Patience, MD .  albuterol (PROVENTIL) (2.5 MG/3ML) 0.083% nebulizer solution 2.5 mg, 2.5 mg, Inhalation, Q6H PRN, Rise Patience, MD .  cefTRIAXone (ROCEPHIN) 2 g in sodium chloride 0.9 % 100 mL IVPB, 2 g, Intravenous, Q24H, Berle Mull M, MD .  labetalol (NORMODYNE) injection 10 mg, 10 mg, Intravenous, Q4H PRN, [place], Bing Neighbors, NP .  loperamide (IMODIUM) capsule 2 mg, 2 mg, Oral, PRN, Bodenheimer, Charles A, NP, 2 mg at 05/29/19 0010 .  neomycin-bacitracin-polymyxin (NEOSPORIN) ointment packet, , Topical, Daily, Max Sane, MD, 1 application at [id number] .  ondansetron (ZOFRAN) tablet 4 mg, 4 mg, Oral, Q6H PRN **OR** ondansetron (ZOFRAN) injection 4 mg, 4 mg, Intravenous, Q6H PRN, Rise Patience, MD .  pantoprazole (PROTONIX) EC tablet 40 mg, 40 mg, Oral, Daily, Rise Patience, MD, 40 mg at 05/28/19 0924 .  sodium chloride flush (NS) 0.9 % injection 10-40 mL, 10-40 mL, Intracatheter, Q12H, Rise Patience, MD, 10 mL at 05/28/19 2319 .  sodium chloride flush (NS) 0.9 % injection 10-40 mL, 10-40 mL, Intracatheter, PRN, Rise Patience, MD, 10 mL at 05/28/19 0925 .  Telotristat Ethyl(as Etiprate) TABS 1 tablet, 1 tablet, Oral, TID, Rise Patience, MD, 1 tablet at  05/29/19 0000   Social History: Social History   Tobacco Use  . Smoking status: Former Smoker    Types: Cigarettes  . Smokeless tobacco: Never Used  Substance Use Topics  . Alcohol use: No    Alcohol/week: 0.0 standard drinks  . Drug use: No    Family Medical History: Family History  Problem Relation Age of Onset  . Heart attack Sister   . Heart attack Brother   . Leukemia Mother     Physical  Examination: Vitals:   05/28/19 2014 05/29/19 0428  BP: (!) 83/57 (!) 81/57  Pulse: 98 98  Resp: 17 17  Temp: 98.6 F (37 C) 98.7 F (37.1 C)  SpO2: 100% 93%     General: Patient is cachetic Psychiatric: Patient is non-anxious.  Neck:   Supple.  Full range of motion.  Respiratory: Patient is breathing without any difficulty.  Skin:    Multiple skin lesions noted. Bandage present above left eyebrow and below left orbit  NEUROLOGICAL:  General: In no acute distress.   Awake, alert, oriented to person, place, and time. EOMI, pupils equal, round, reactive Speech fluent and clear. No evidence of facial drooping Able to answer questions appropriately. Identified 2 objects without difficulty.  Pronator drift noted on right Imaging: EXAM: CT HEAD WITHOUT CONTRAST  05/28/2019 01:12  IMPRESSION: 1. Left frontoparietal subdural collection measures 6 mm, likely subacute. This is new from exam 3 weeks ago. No underlying mass effect or midline shift. 2. Stable degree of generalized atrophy and chronic small vessel ischemia. Encephalomalacia in the left parietooccipital lobe.   EXAM: CT HEAD WITHOUT CONTRAST   IMPRESSION: Atrophy with small vessel chronic ischemic changes of deep cerebral white matter.  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  Stable small subacute LEFT frontoparietal subdural hematoma measuring up to 6 mm thick.  No new intracranial abnormalities.  05/28/2019 08:09  Assessment and Plan: 05/29/2019 Continue with yesterdays stated plan in regard to subdural findings. Plan outpatient follow up in 3-4 weeks to monitor progress. D/c anticoagulation.   05/28/2019 Ms. Veronica Wang is a pleasant 83 y.o. female with 6 mm left frontoparietal subdural hemorrhage.  Exam was brief due to CT getting ready to take her down for repeat head CT.  Neurologically intact except for diffuse left lower extremity weakness that she states has been present since initial fall  about 3 weeks ago.  Will await CT results to determine appropriate plan.  Marin Olp, PA-C Dept. of Neurosurgery   Attending: Repeat Head CT is stable.  Continue to hold antiplatelet therapy and anticoagulation.  Admission per Medicine for weakness.  Will follow up for intracranial hemorrhage after discharge - no acute surgical intervention recommended.  Meade Maw MD

## 2019-05-30 ENCOUNTER — Inpatient Hospital Stay: Payer: Medicare Other

## 2019-05-30 LAB — GLUCOSE, CAPILLARY: Glucose-Capillary: 166 mg/dL — ABNORMAL HIGH (ref 70–99)

## 2019-05-30 LAB — CBC WITH DIFFERENTIAL/PLATELET
Abs Immature Granulocytes: 0.65 10*3/uL — ABNORMAL HIGH (ref 0.00–0.07)
Basophils Absolute: 0.2 10*3/uL — ABNORMAL HIGH (ref 0.0–0.1)
Basophils Relative: 1 %
Eosinophils Absolute: 0 10*3/uL (ref 0.0–0.5)
Eosinophils Relative: 0 %
HCT: 38.1 % (ref 36.0–46.0)
Hemoglobin: 12.8 g/dL (ref 12.0–15.0)
Immature Granulocytes: 3 %
Lymphocytes Relative: 4 %
Lymphs Abs: 1 10*3/uL (ref 0.7–4.0)
MCH: 24.9 pg — ABNORMAL LOW (ref 26.0–34.0)
MCHC: 33.6 g/dL (ref 30.0–36.0)
MCV: 74 fL — ABNORMAL LOW (ref 80.0–100.0)
Monocytes Absolute: 0.5 10*3/uL (ref 0.1–1.0)
Monocytes Relative: 2 %
Neutro Abs: 20.5 10*3/uL — ABNORMAL HIGH (ref 1.7–7.7)
Neutrophils Relative %: 90 %
Platelets: 269 10*3/uL (ref 150–400)
RBC: 5.15 MIL/uL — ABNORMAL HIGH (ref 3.87–5.11)
RDW: 19.9 % — ABNORMAL HIGH (ref 11.5–15.5)
WBC: 22.8 10*3/uL — ABNORMAL HIGH (ref 4.0–10.5)
nRBC: 0 % (ref 0.0–0.2)

## 2019-05-30 LAB — COMPREHENSIVE METABOLIC PANEL
ALT: 17 U/L (ref 0–44)
AST: 23 U/L (ref 15–41)
Albumin: 1.8 g/dL — ABNORMAL LOW (ref 3.5–5.0)
Alkaline Phosphatase: 159 U/L — ABNORMAL HIGH (ref 38–126)
Anion gap: 12 (ref 5–15)
BUN: 31 mg/dL — ABNORMAL HIGH (ref 8–23)
CO2: 22 mmol/L (ref 22–32)
Calcium: 8.1 mg/dL — ABNORMAL LOW (ref 8.9–10.3)
Chloride: 106 mmol/L (ref 98–111)
Creatinine, Ser: 0.87 mg/dL (ref 0.44–1.00)
GFR calc Af Amer: >60 mL/min (ref >60)
GFR calc non Af Amer: >60 mL/min (ref >60)
Glucose, Bld: 145 mg/dL — ABNORMAL HIGH (ref 70–99)
Potassium: 3.8 mmol/L (ref 3.5–5.1)
Sodium: 140 mmol/L (ref 135–145)
Total Bilirubin: 0.6 mg/dL (ref 0.3–1.2)
Total Protein: 5.4 g/dL — ABNORMAL LOW (ref 6.5–8.1)

## 2019-05-30 MED ORDER — HALOPERIDOL LACTATE 2 MG/ML PO CONC
0.5000 mg | ORAL | Status: DC | PRN
  Filled 2019-05-30: qty 0.3

## 2019-05-30 MED ORDER — GLYCOPYRROLATE 1 MG PO TABS
1.0000 mg | ORAL_TABLET | ORAL | Status: DC | PRN
  Filled 2019-05-30: qty 1

## 2019-05-30 MED ORDER — HYDROCODONE-ACETAMINOPHEN 5-325 MG PO TABS
1.0000 | ORAL_TABLET | Freq: Four times a day (QID) | ORAL | Status: DC | PRN
  Administered 2019-05-30: 1 via ORAL
  Filled 2019-05-30: qty 1

## 2019-05-30 MED ORDER — HALOPERIDOL 0.5 MG PO TABS
0.5000 mg | ORAL_TABLET | ORAL | Status: DC | PRN
  Filled 2019-05-30: qty 1

## 2019-05-30 MED ORDER — GLYCOPYRROLATE 0.2 MG/ML IJ SOLN
0.2000 mg | INTRAMUSCULAR | Status: DC | PRN
  Filled 2019-05-30: qty 1

## 2019-05-30 MED ORDER — MORPHINE SULFATE (PF) 2 MG/ML IV SOLN
INTRAVENOUS | Status: AC
  Filled 2019-05-30: qty 1

## 2019-05-30 MED ORDER — ACETAMINOPHEN 325 MG PO TABS: 650.0000 mg | ORAL_TABLET | Freq: Four times a day (QID) | ORAL | Status: DC | PRN

## 2019-05-30 MED ORDER — POLYVINYL ALCOHOL 1.4 % OP SOLN
1.0000 [drp] | Freq: Four times a day (QID) | OPHTHALMIC | Status: DC | PRN
  Filled 2019-05-30: qty 15

## 2019-05-30 MED ORDER — LORAZEPAM 1 MG PO TABS: 1.0000 mg | ORAL_TABLET | ORAL | Status: DC | PRN

## 2019-05-30 MED ORDER — HALOPERIDOL LACTATE 5 MG/ML IJ SOLN: 0.5000 mg | INTRAMUSCULAR | Status: DC | PRN

## 2019-05-30 MED ORDER — MORPHINE SULFATE (PF) 2 MG/ML IV SOLN: 1.0000 mg | INTRAVENOUS | Status: DC | PRN

## 2019-05-30 MED ORDER — ONDANSETRON HCL 4 MG/2ML IJ SOLN: 4.0000 mg | Freq: Four times a day (QID) | INTRAMUSCULAR | Status: DC | PRN

## 2019-05-30 MED ORDER — METOPROLOL TARTRATE 25 MG PO TABS
25.0000 mg | ORAL_TABLET | Freq: Two times a day (BID) | ORAL | Status: DC
  Administered 2019-05-30: 25 mg via ORAL
  Filled 2019-05-30: qty 1

## 2019-05-30 MED ORDER — LORAZEPAM 2 MG/ML PO CONC
1.0000 mg | ORAL | Status: DC | PRN
  Filled 2019-05-30: qty 0.5

## 2019-05-30 MED ORDER — SODIUM CHLORIDE 0.9 % IV BOLUS
500.0000 mL | Freq: Once | INTRAVENOUS | Status: AC
  Administered 2019-05-30: 500 mL via INTRAVENOUS

## 2019-05-30 MED ORDER — LORAZEPAM 2 MG/ML IJ SOLN: 0.5000 mg | INTRAMUSCULAR | Status: DC | PRN

## 2019-05-30 MED ORDER — ACETAMINOPHEN 650 MG RE SUPP: 650.0000 mg | Freq: Four times a day (QID) | RECTAL | Status: DC | PRN

## 2019-05-30 MED ORDER — MORPHINE SULFATE (PF) 2 MG/ML IV SOLN
2.0000 mg | INTRAVENOUS | Status: AC
  Administered 2019-05-30: 2 mg via INTRAVENOUS

## 2019-05-30 MED ORDER — BIOTENE DRY MOUTH MT LIQD: 15.0000 mL | OROMUCOSAL | Status: DC | PRN

## 2019-05-30 MED ORDER — METOPROLOL TARTRATE 5 MG/5ML IV SOLN
2.5000 mg | Freq: Once | INTRAVENOUS | Status: AC
  Administered 2019-05-30: 2.5 mg via INTRAVENOUS
  Filled 2019-05-30: qty 5

## 2019-05-30 MED ORDER — ONDANSETRON 4 MG PO TBDP
4.0000 mg | ORAL_TABLET | Freq: Four times a day (QID) | ORAL | Status: DC | PRN
  Filled 2019-05-30: qty 1

## 2019-05-31 LAB — CULTURE, BLOOD (ROUTINE X 2): Special Requests: ADEQUATE

## 2019-06-02 LAB — CULTURE, BLOOD (ROUTINE X 2)

## 2019-06-03 ENCOUNTER — Encounter (INDEPENDENT_AMBULATORY_CARE_PROVIDER_SITE_OTHER): Payer: Medicare Other

## 2019-06-03 ENCOUNTER — Ambulatory Visit (INDEPENDENT_AMBULATORY_CARE_PROVIDER_SITE_OTHER): Payer: Medicare Other | Admitting: Nurse Practitioner

## 2019-06-03 ENCOUNTER — Encounter (INDEPENDENT_AMBULATORY_CARE_PROVIDER_SITE_OTHER): Payer: Self-pay

## 2019-06-10 ENCOUNTER — Ambulatory Visit: Payer: Medicare Other | Admitting: Physician Assistant

## 2019-06-10 NOTE — Plan of Care (Signed)
Pt expired 1330

## 2019-06-10 NOTE — Discharge Summary (Signed)
[facility] Death Summary   Patient: Veronica Wang [id number]   PCP: Adin Hector, MD DOB: [birth year]   Date of admission: 24-Jun-2019   Date and time of death: 06-27-19   Hospital Diagnoses:  Cause of death Subdural hematoma And sepsis due to Acinetobacter  Principal Problem:   SIRS (systemic inflammatory response syndrome) ([place]) Active Problems:   Carcinoid tumor of intestine   COPD (chronic obstructive pulmonary disease) ([place])   HTN (hypertension)   Atrial fibrillation with RVR (HCC)   Subdural hematoma (HCC)   Pressure injury of skin   Bacteremia due to Gram-negative bacteria   Weakness   Lactic acidosis   Leukocytosis    History of present illness: As per the H and P dictated on admission, "Veronica Wang is a 83 y.o. female with history of metastatic carcinoid being followed at [facility], stroke, chronic systolic heart failure last EF measured in April 2020 was 20 to 25%, A. fib on Eliquis, protein calorie malnutrition multiple wounds was brought to the ER the patient was getting increasingly weak with multiple falls.  Patient's daughter who provided most of the history states that patient has progressively got weak since he got discharged last month from the hospital.  She has been a multiple falls.  But multiple members of the family had gone to visit patient at her apartment where she lives alone she was found to be on the floor on multiple occasions.  Due to ongoing weakness patient was brought to the ER.  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  Did not complain of any chest pain or shortness of breath has been having poor appetite.  Did not have any abdominal pain or nausea vomiting.  ED Course: In the ER patient was hypotensive was given fluid bolus.  Lab work show elevated lactate level leukocytosis with the CT chest and UA showing no definite signs of infection.  Has multiple wounds.  However since patient's presentation was concerning  for sepsis patient was started on empiric antibiotic after cultures obtained.  COVID-19 test is pending.  After the admission patient went into A. fib with RVR for which I have ordered 1 dose of metoprolol 2.5 mg IV.  Since patient also had multiple falls CT head was ordered which shows 6 mm subdural hematoma on the left frontoparietal area.  Patient appears nonfocal."  Hospital Course:  1.   Acinetobacter gram-negative bacteremia/Sepsis.  Patient has multiple wounds which could potentially be a source. Based on blood c/s growth of GNR - Abx changed to Levaquin, now to ceftriaxone. Family's goal was to transition to complete comfort.  2. Subacute subdural hematoma on the left frontoparietal area- Dr. Lacinda Axon on-call neurosurgeon who advised at this time to give reversal agent for apixaban and repeat CT head in 6 hours not showing any extension of bleed.  Discussed with patient's daughter about the plan also discussed that patient has risk for stroke that we are discontinuing apixaban and reversing agent.  Patient's daughter agreeable. She would like to keep her comfortable and transition to Hospice if she qualifies  3. A. fib with RVR  1 dose of 2.5 mg IV metoprolol has been given. Closely observeon off unit tele.  Holding off apixaban due to subdural hematoma. Patient's daughter agreeable.  4. Hypertension remainshypotensive due to sepsis  5. History of carcinoid syndrome to continue home medications. Continues to have diarrhea. Monitor.  6. Severe protein calorie malnutrition-underweight Poor po intake Dietary consulted.  7. COPD not  actively wheezing.  8. Chronic systolic heart failure last EF measured was in April 2020 which showed EF of 20 to 25%.  Holding Lasix or any diuretics due to low normal blood pressure septic picture.  10. History of previous stroke on apixaban which is on hold due to subdural hematoma patient's daughter agreeable.  11. History  of dysphagia secondary to stroke.  12. Multiple skin wounds- appreciatewound team consult. See below wound listed per wound care nurse which I agree with, all present on admission The patient was pronounced deceased at 13:30, on Jun 21, 2019.  Procedures and Results:  None  Consultations:  Phone consultation with neurosurgery  Palliative care  The results of significant diagnostics from this hospitalization (including imaging, microbiology, ancillary and laboratory) are listed below for reference.    Significant Diagnostic Studies: Ct Head Wo Contrast  Result Date: 05/28/2019 CLINICAL DATA:  Subdural hemorrhage, follow-up EXAM: CT HEAD WITHOUT CONTRAST TECHNIQUE: Contiguous axial images were obtained from the base of the skull through the vertex without intravenous contrast. Sagittal and coronal MPR images reconstructed from axial data set. COMPARISON:  Exam at 0736 hours compared to 05/28/2019 at 0049 hours FINDINGS: Brain: Generalized atrophy. Normal ventricular morphology. No midline shift or mass effect. Small vessel chronic ischemic changes of deep cerebral white matter. 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 Small LEFT frontoparietal subdural hematoma measuring up to 6 mm thick unchanged, intermediate attenuation question subacute. No new intracranial hemorrhage, mass lesion or evidence of acute infarct. Vascular: Minimal scattered atherosclerotic calcifications of internal carotid and vertebral arteries at skull base Skull: Demineralized but intact Sinuses/Orbits: Clear Other: N/A IMPRESSION: Atrophy with small vessel chronic ischemic changes of deep cerebral white matter. 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 Stable small subacute LEFT frontoparietal subdural hematoma measuring up to 6 mm thick. No new intracranial abnormalities. Electronically Signed   By: Lavonia Dana M.D.   On: 05/28/2019 08:09   Ct Head Wo Contrast  Addendum Date: 05/28/2019   ADDENDUM REPORT: 05/28/2019 01:29  ADDENDUM: Critical Value/emergent results were called by telephone at the time of interpretation on 05/28/2019 at 1:29 am to Dr Gean Birchwood , who verbally acknowledged these results. Electronically Signed   By: Keith Rake M.D.   On: 05/28/2019 01:29   Result Date: 05/28/2019 CLINICAL DATA:  Ataxia, stroke suspected Weakness and falls. EXAM: CT HEAD WITHOUT CONTRAST TECHNIQUE: Contiguous axial images were obtained from the base of the skull through the vertex without intravenous contrast. Examination performed less than 2 hours after contrast-enhanced chest CT. COMPARISON:  Head CT 05/06/2019 FINDINGS: Brain: Left frontoparietal subdural collection measures 6 mm, likely subacute without high density component. This is new from prior exam. Intravascular contrast from prior IV contrast administration partially limits evaluation for subarachnoid hemorrhage, allowing for this, subarachnoid hemorrhage is visualized. No intraparenchymal hemorrhage. No underlying mass effect or midline shift. Stable degree of generalized atrophy, moderate for age. Encephalomalacia in the left parietooccipital lobe. Generalized chronic small vessel ischemia. No evidence of acute ischemia. Basilar cisterns are patent. Vascular: Intravascular contrast from prior IV contrast administration for chest CT, no evidence of large vessel occlusion/hyperdense vessel. There is skull base atherosclerosis. Skull: No fracture or focal lesion. Sinuses/Orbits: Unchanged fluid level in the sphenoid sinus mild adjacent sclerosis, chronic. Mastoid air cells are clear. No acute orbital abnormality. Other: None. IMPRESSION: 1. Left frontoparietal subdural collection measures 6 mm, likely subacute. This is new from exam 3 weeks ago. No underlying mass effect or midline shift. 2. Stable degree of generalized atrophy and chronic  small vessel ischemia. Encephalomalacia in the left parietooccipital lobe. Electronically Signed: By: Keith Rake M.D.  On: 05/28/2019 01:12   Ct Head Wo Contrast  Result Date: 05/06/2019 CLINICAL DATA:  Fall, facial pain.  Anticoagulation. EXAM: CT HEAD WITHOUT CONTRAST TECHNIQUE: Contiguous axial images were obtained from the base of the skull through the vertex without intravenous contrast. COMPARISON:  04/30/2019 FINDINGS: Brain: Evolving encephalomalacia within the left parietooccipital lobe at site of prior infarct. No evidence of an acute infarction, hemorrhage, extra-axial collection, or mass lesion/mass effect. Scattered low-density changes within the periventricular and subcortical white matter compatible with chronic microvascular ischemic change. Moderate diffuse cerebral volume loss. Vascular: Mild atherosclerotic calcifications involving the large vessels of the skull base. No unexpected hyperdense vessel. Skull: Normal. Negative for fracture or focal lesion. Sinuses/Orbits: Small amount of fluid in the left sphenoid sinus, decreased from prior. Remaining paranasal sinuses and mastoid air cells are clear. Orbital structures intact. Other: None. IMPRESSION: 1.  No acute intracranial findings. 2.  Continued evolution of left parieto-occipital infarct. 3.  Chronic microvascular ischemic change and cerebral volume loss. Electronically Signed   By: Davina Poke M.D.   On: 05/06/2019 15:34   Ct Chest W Contrast  Result Date: 05/18/2019 CLINICAL DATA:  Three falls this week. Acute resp illness, > 66 years old EXAM: CT CHEST WITH CONTRAST TECHNIQUE: Multidetector CT imaging of the chest was performed during intravenous contrast administration. CONTRAST:  67mL OMNIPAQUE IOHEXOL 300 MG/ML  SOLN COMPARISON:  Radiograph earlier this day. Chest CT 1 month ago 04/30/2019 FINDINGS: Cardiovascular: Atherosclerosis of the thoracic aorta without dissection or acute aortic abnormality. Ectasia of the ascending aorta at 3.5 cm, unchanged. Conventional 3 vessel branching pattern from the aortic arch. Mild multi chamber  cardiomegaly. There are coronary artery calcifications. No filling defects in the pulmonary arteries to the segmental level, there are no filling defects within the pulmonary arteries to suggest pulmonary embolus. No pericardial effusion. Mediastinum/Nodes: No enlarged mediastinal or hilar lymph nodes. Scattered areas of soft tissue wall thickening. No suspicious thyroid nodule. Lungs/Pleura: Moderate to advanced emphysema. Chronic atelectasis and volume loss of the medial left lower lobe. Small adjacent left pleural effusion. Findings are unchanged from prior exam. Minor atelectasis in the dependent right lower lobe. No right pleural effusion. No pulmonary edema. Trachea and mainstem bronchi are patent. No pulmonary mass. Mild biapical pleuroparenchymal scarring. Upper Abdomen: Multiple enhancing liver lesions again seen, not significantly changed from prior. No acute findings. Musculoskeletal: Exaggerated thoracic kyphosis. Sclerosis involving anterior T10 vertebral body is unchanged from prior exam. Additional sclerotic lesions within T6 and T7 superior vertebral bodies also unchanged. Sclerosis in the sternum centered at the manubrium may be degenerative, but is nonspecific. Pectus excavatum deformity incidentally noted. Paucity of body fat suggesting cachexia. IMPRESSION: 1. Moderate to advanced emphysema. 2. Chronic atelectasis and volume loss in the medial left lower lobe. Small adjacent left pleural effusion. Findings are unchanged from CT last month. No new pulmonary disease. 3. Cardiomegaly with coronary artery calcifications. 4. Sclerotic lesions in the thoracic spine, stable. 5. Paucity of body fat suggest cachexia. Aortic Atherosclerosis (ICD10-I70.0) and Emphysema (ICD10-J43.9). Electronically Signed   By: Keith Rake M.D.   On: 06/05/2019 23:34   Ct Cervical Spine Wo Contrast  Result Date: 05/06/2019 CLINICAL DATA:  Neck pain after fall EXAM: CT CERVICAL SPINE WITHOUT CONTRAST TECHNIQUE:  Multidetector CT imaging of the cervical spine was performed without intravenous contrast. Multiplanar CT image reconstructions were also generated. COMPARISON:  08/13/2018, 09/20/2015 FINDINGS:  Alignment: Exaggerated cervical lordosis, which may be positional. No static listhesis. Skull base and vertebrae: Nondisplaced fracture of the proximal right first rib at the costovertebral junction (series 6, image 59), new from prior. The cervical vertebrae are intact without fracture. No bone lesion. Soft tissues and spinal canal: No prevertebral fluid or swelling. No visible canal hematoma. Disc levels: Intervertebral disc heights are relatively preserved. Multilevel facet and uncovertebral arthropathy. No evidence of high-grade foraminal or canal stenosis by CT. Upper chest: Emphysematous changes of the lung apices with chronic biapical pleuroparenchymal scarring. Other: None. IMPRESSION: 1. Nondisplaced fracture of the posterior right first rib at the costovertebral junction. 2. No acute cervical spine fracture or posttraumatic subluxation. 3. Emphysematous changes of the lung apices with chronic biapical pleuroparenchymal scarring. Electronically Signed   By: Davina Poke M.D.   On: 05/06/2019 15:43   Dg Chest Port 1 View  Result Date: 06-29-19 CLINICAL DATA:  Shortness of breath EXAM: PORTABLE CHEST 1 VIEW COMPARISON:  05/11/2019 FINDINGS: The lungs are hyperinflated likely secondary to COPD. There is a small left pleural effusion with left basilar airspace disease likely reflecting atelectasis. There is no pneumothorax. The heart size is enlarged. There is no acute osseous abnormality. IMPRESSION: 1. Persistent small left pleural effusion with left basilar airspace disease likely reflecting atelectasis. Electronically Signed   By: Kathreen Devoid   On: June 29, 2019 11:44   Dg Chest Portable 1 View  Result Date: 05/23/2019 CLINICAL DATA:  Weakness, multiple falls EXAM: PORTABLE CHEST 1 VIEW COMPARISON:   04/30/2019 FINDINGS: There is hyperinflation of the lungs compatible with COPD. Cardiomegaly. Left lower lobe opacity and left effusion again noted, unchanged. Right lung clear. No acute bony abnormality. IMPRESSION: COPD. Cardiomegaly. Persistent small left effusion with left lower lobe consolidation. No real change. Electronically Signed   By: Rolm Baptise M.D.   On: 06/07/2019 22:40   Dg Knee Complete 4 Views Right  Result Date: 05/06/2019 CLINICAL DATA:  Right knee pain secondary to a fall yesterday. EXAM: RIGHT KNEE - COMPLETE 4+ VIEW COMPARISON:  None. FINDINGS: No evidence of fracture, dislocation, or joint effusion. No evidence of arthropathy or other focal bone abnormality. Soft tissues are unremarkable. Osteopenia. IMPRESSION: No acute abnormalities. Electronically Signed   By: Lorriane Shire M.D.   On: 05/06/2019 15:45   Dg Hip Unilat W Or Wo Pelvis 2-3 Views Left  Result Date: 05/06/2019 CLINICAL DATA:  Left hip pain secondary to a fall yesterday. EXAM: DG HIP (WITH OR WITHOUT PELVIS) 2-3V LEFT COMPARISON:  None. FINDINGS: There is no evidence of hip fracture or dislocation. There is medial joint space narrowing of both hips. Osteopenia. Multiple surgical clips and staples in the pelvis. IMPRESSION: No acute abnormalities. Electronically Signed   By: Lorriane Shire M.D.   On: 05/06/2019 15:44    Microbiology: Recent Results (from the past 240 hour(s))  Blood culture (routine x 2)     Status: Abnormal   Collection Time: 05/24/2019 10:50 PM   Specimen: BLOOD  Result Value Ref Range Status   Specimen Description   Final    BLOOD RIGHT ARM Performed at [facility], [address] [place], [place] [address]    Special Requests   Final    BOTTLES DRAWN AEROBIC AND ANAEROBIC Blood Culture adequate volume Performed at [facility], [address] [place], [place] [address]    Culture  Setup Time   Final    GRAM NEGATIVE RODS CRITICAL RESULT CALLED TO, READ BACK  BY  AND VERIFIED WITH: JASON ROBBINS 05/28/19 @ 1636  [place]    Culture (A)  Final    ACINETOBACTER LWOFFII SUSCEPTIBILITIES PERFORMED ON PREVIOUS CULTURE WITHIN THE LAST 5 DAYS. Performed at [facility], [place] [address] [place], [place] [address]    Report Status 05/31/2019 FINAL  Final  Blood culture (routine x 2)     Status: Abnormal   Collection Time: 05/18/2019 10:50 PM   Specimen: BLOOD  Result Value Ref Range Status   Specimen Description   Final    BLOOD LEFT ANTECUBITAL Performed at [facility], [place] [address], [place], [place] [address]    Special Requests   Final    BOTTLES DRAWN AEROBIC AND ANAEROBIC Blood Culture results may not be optimal due to an excessive volume of blood received in culture bottles Performed at [facility], [address] [place], [place] [address]    Culture  Setup Time   Final    GRAM NEGATIVE RODS CRITICAL RESULT CALLED TO, READ BACK BY AND VERIFIED WITH: JASON ROBBINS 05/28/19 @ 1636  [place] Performed at [place] [address], [place], [place] [address]    Culture ACINETOBACTER LWOFFII (A)  Final   Report Status 06/02/2019 FINAL  Final   Organism ID, Bacteria ACINETOBACTER LWOFFII  Final      Susceptibility   Acinetobacter lwoffii - MIC*    CEFTAZIDIME 2 SENSITIVE Sensitive     CEFTRIAXONE 32 INTERMEDIATE Intermediate     CIPROFLOXACIN 0.5 SENSITIVE Sensitive     GENTAMICIN <=1 SENSITIVE Sensitive     IMIPENEM <=0.25 SENSITIVE Sensitive     PIP/TAZO >=128 RESISTANT Resistant     TRIMETH/SULFA <=20 SENSITIVE Sensitive     CEFEPIME <=1 SENSITIVE Sensitive     AMPICILLIN/SULBACTAM <=2 SENSITIVE Sensitive     * ACINETOBACTER LWOFFII  Blood Culture ID Panel (Reflexed)     Status: None   Collection Time: 05/25/2019 10:50 PM  Result Value Ref Range Status   Enterococcus species NOT DETECTED NOT DETECTED Final   Listeria monocytogenes NOT DETECTED NOT DETECTED Final    Staphylococcus species NOT DETECTED NOT DETECTED Final   Staphylococcus aureus (BCID) NOT DETECTED NOT DETECTED Final   Streptococcus species NOT DETECTED NOT DETECTED Final   Streptococcus agalactiae NOT DETECTED NOT DETECTED Final   Streptococcus pneumoniae NOT DETECTED NOT DETECTED Final   Streptococcus pyogenes NOT DETECTED NOT DETECTED Final   Acinetobacter baumannii NOT DETECTED NOT DETECTED Final   Enterobacteriaceae species NOT DETECTED NOT DETECTED Final   Enterobacter cloacae complex NOT DETECTED NOT DETECTED Final   Escherichia coli NOT DETECTED NOT DETECTED Final   Klebsiella oxytoca NOT DETECTED NOT DETECTED Final   Klebsiella pneumoniae NOT DETECTED NOT DETECTED Final   Proteus species NOT DETECTED NOT DETECTED Final   Serratia marcescens NOT DETECTED NOT DETECTED Final   Haemophilus influenzae NOT DETECTED NOT DETECTED Final   Neisseria meningitidis NOT DETECTED NOT DETECTED Final   Pseudomonas aeruginosa NOT DETECTED NOT DETECTED Final   Candida albicans NOT DETECTED NOT DETECTED Final   Candida glabrata NOT DETECTED NOT DETECTED Final   Candida krusei NOT DETECTED NOT DETECTED Final   Candida parapsilosis NOT DETECTED NOT DETECTED Final   Candida tropicalis NOT DETECTED NOT DETECTED Final    Comment: Performed at [facility], [address] [place], [place] [address]  SARS CORONAVIRUS 2 (TAT 6-24 HRS) Nasopharyngeal Nasopharyngeal Swab     Status: None  Collection Time: 05/28/19 12:16 AM   Specimen: Nasopharyngeal Swab  Result Value Ref Range Status   SARS Coronavirus 2 NEGATIVE NEGATIVE Final    Comment: (NOTE) SARS-CoV-2 target nucleic acids are NOT DETECTED. The SARS-CoV-2 RNA is generally detectable in upper and lower respiratory specimens during the acute phase of infection. Negative results do not preclude SARS-CoV-2 infection, do not rule out co-infections with other pathogens, and should not be used as the sole basis for treatment or other  patient management decisions. Negative results must be combined with clinical observations, patient history, and epidemiological information. The expected result is Negative. Fact Sheet for Patients: SugarRoll.be Fact Sheet for Healthcare Providers: https://www.woods-mathews.com/ This test is not yet approved or cleared by the [place] FDA and  has been authorized for detection and/or diagnosis of SARS-CoV-2 by FDA under an Emergency Use Authorization (EUA). This EUA will remain  in effect (meaning this test can be used) for the duration of the COVID-19 declaration under Section 56 4(b)(1) of the Act, 21 U.S.C. section 360bbb-3(b)(1), unless the authorization is terminated or revoked sooner. Performed at [facility], [place] [address], [place], [place] [address]      Labs: CBC: Recent Labs  Lab 06/16/19 1122  WBC 22.8*  NEUTROABS 20.5*  HGB 12.8  HCT 38.1  MCV 74.0*  PLT [id number]   Basic Metabolic Panel: Recent Labs  Lab Jun 16, 2019 1122  NA 140  K 3.8  CL 106  CO2 22  GLUCOSE 145*  BUN 31*  CREATININE 0.87  CALCIUM 8.1*   Liver Function Tests: Recent Labs  Lab 2019/06/16 1122  AST 23  ALT 17  ALKPHOS 159*  BILITOT 0.6  PROT 5.4*  ALBUMIN 1.8*   No results for input(s): LIPASE, AMYLASE in the last 168 hours. No results for input(s): AMMONIA in the last 168 hours. Cardiac Enzymes: No results for input(s): CKTOTAL, CKMB, CKMBINDEX, TROPONINI in the last 168 hours. BNP (last 3 results) Recent Labs    10/26/18 1525 10/28/18 0459 03/17/19 0913  BNP 1,693.0* 1,110.0* 2,597.0*   CBG: Recent Labs  Lab 06-16-2019 1155  GLUCAP 166*   Time spent: 30 minutes  Signed:  Berle Mull  [facility] 06/16/2019,

## 2019-06-10 NOTE — Progress Notes (Addendum)
Daily Progress Note   Patient Name: Veronica Wang       Date: 06-26-2019 DOB: April 20, 1936  Age: 83 y.o. MRN#: [id number] Attending Physician: Lavina Hamman, MD Primary Care Physician: Adin Hector, MD Admit Date: 05/28/2019  Reason for Consultation/Follow-up: Establishing goals of care  Subjective: Patient is resting in bed. She appears tachypneic. She states she feels she can improve and return home as she has in the past. We discussed moving her to ICU stepdown per primary MD IM message to me prior to visit. She discusses visitor restrictions due to [place], and discusses how she would feel if she were to die here without all family members present at her side, and states she would like to see her family. She requests her daughter to come speak with her. Called daughter and daughter quickly arrived.   Daughter is encouraged she is eating soup upon her arrival. As we all talked, patient realized she would not be able to return to her independent living which is very important to her. She states she really does not want to die in her daughter's house because she does not want them, including the grandchildren to  see that, or remember her death in their home. She states she knows her time is limited and would rather die in the hospital when the time comes, if she cannot make arrangements with the family to die in her home; daughter wants her to be at their home with the family at end of  life. We further discussed discharge planning hospice options (hospice at home vs hospice facility) and if the patient wanted to transition to comfort care at this time, or continue full scope care at this time. Patient continued to be mildly tachypniec. Patient states she wants her daughter to decide what to  do. Daughter decided to take her home with hospice so she could be with family for what time she has left, and asked further questions about going home with comfort based care. She requests to take patient home as soon as arrangements can be made today, but states she realizes she will need help at home caring for her. She called a family member Debbie to discuss this plan and the logistics of providing care at home, and Jackelyn Poling was in agreement with bringing her home. Decision  was made for home with hospice and comfort care.  MOST form completed for comfort care, and discussion was had regarding placing referral for hospice at home and arrangements for discharge. Chaplain in to see patient.    Patient began to complain of feeling hot and complained of abdominal pain. Her LOC began to decline.  She states to her daughter she feels she is not going to make it and complains of SOB.  Daughter remained at bedside, and comforted her mother telling her it is okay to go, that she knows she is tired, and that she would see her in heaven. Comfort care orders placed per our discussion using EOL order set. I remained at bedside with patient and family. Patient died peacefully with daughter at bedside. Chaplain reterned and other family members to bedside.    I completed a MOST form today and the signed original was placed in the chart. It was signed by daughter as patient was too weak. A photocopy was placed in the chart to be scanned into EMR. The patient outlined their wishes for the following treatment decisions:  Cardiopulmonary Resuscitation: Do Not Attempt Resuscitation (DNR/No CPR)  Medical Interventions: Comfort Measures: Keep clean, warm, and dry. Use medication by any route, positioning, wound care, and other measures to relieve pain and suffering. Use oxygen, suction and manual treatment of airway obstruction as needed for comfort. Do not transfer to the hospital unless comfort needs cannot be met in current  location.  Antibiotics: No antibiotics (use other measures to relieve symptoms)  IV Fluids: No IV fluids (provide other measures to ensure comfort)  Feeding Tube: No feeding tube    Length of Stay: 2  Current Medications: Scheduled Meds:   morphine       feeding supplement (ENSURE ENLIVE)  237 mL Oral BID BM   metoprolol tartrate  25 mg Oral BID   neomycin-bacitracin-polymyxin   Topical Daily   pantoprazole  40 mg Oral Daily   sodium chloride flush  10-40 mL Intracatheter Q12H   Telotristat Ethyl(as Etiprate)  1 tablet Oral TID    Continuous Infusions:  cefTRIAXone (ROCEPHIN)  IV 200 mL/hr at 06-27-19 0004    PRN Meds: acetaminophen **OR** acetaminophen, albuterol, antiseptic oral rinse, glycopyrrolate **OR** glycopyrrolate **OR** glycopyrrolate, haloperidol **OR** haloperidol **OR** haloperidol lactate, labetalol, loperamide, LORazepam **OR** LORazepam **OR** LORazepam, morphine injection, ondansetron **OR** ondansetron (ZOFRAN) IV, polyvinyl alcohol, sodium chloride flush  Physical Exam Pulmonary:     Comments: Tachypneic Skin:    General: Skin is warm and dry.  Neurological:     Mental Status: She is alert.             Vital Signs: BP 90/68    Pulse 80    Temp (!) 97.5 F (36.4 C) (Oral)    Resp 20    Ht '4\' 11"'  (1.499 m)    Wt 36.6 kg    SpO2 98%    BMI 16.30 kg/m  SpO2: SpO2: 98 % O2 Device: O2 Device: Nasal Cannula O2 Flow Rate: O2 Flow Rate (L/min): 2 L/min  Intake/output summary:   Intake/Output Summary (Last 24 hours) at 2019/06/27 1423 Last data filed at June 27, 2019 0326 Gross per 24 hour  Intake 207.18 ml  Output --  Net 207.18 ml   LBM: Last BM Date: 05/29/19 Baseline Weight: Weight: 36.6 kg Most recent weight: Weight: 36.6 kg       Palliative Assessment/Data: 30%- 0%      Patient Active Problem List   Diagnosis Date  Noted   SIRS (systemic inflammatory response syndrome) (HCC) 05/28/2019   Atrial fibrillation with RVR (HCC)  05/28/2019   Subdural hematoma ([place]) 05/28/2019   Pressure injury of skin 05/28/2019   Bacteremia due to Gram-negative bacteria    Weakness    Lactic acidosis    Leukocytosis    Aspiration pneumonia ([place]) 04/30/2019   Lower limb ulcer, calf ([place]) 03/25/2019   Swelling of limb 03/25/2019   Gross hematuria 03/23/2019   Acute exacerbation of CHF (congestive heart failure) ([place]) 10/26/2018   Protein-calorie malnutrition, severe 08/14/2018   Stroke ([place]) 08/13/2018   HTN (hypertension) 08/13/2018   HLD (hyperlipidemia) 08/13/2018   Chronic combined systolic (congestive) and diastolic (congestive) heart failure ([place]) 11/27/2017   PAF (paroxysmal atrial fibrillation) ([place]) 11/27/2017   Abnormal MRI 09/10/2015   Malnutrition of moderate degree ([place]) 12/06/2014   Pneumonia 12/05/2014   COPD (chronic obstructive pulmonary disease) ([place]) 12/05/2014   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    Code Status:    Code Status Orders  (From admission, onward)         Start     Ordered   06/22/2019 1312  Do not attempt resuscitation (DNR)  Continuous    Question Answer Comment  In the event of cardiac or respiratory ARREST Do not call a code blue   In the event of cardiac or respiratory ARREST Do not perform Intubation, CPR, defibrillation or ACLS   In the event of cardiac or respiratory ARREST Use medication by any route, position, wound care, and other measures to relive pain and suffering. May use oxygen, suction and manual treatment of airway obstruction as needed for comfort.   Comments MOST form in chart      06/22/2019 1313        Code Status History    Date Active Date Inactive Code Status Order ID Comments User Context   05/28/2019 0227 June 22, 2019 1313 DNR  [id number]  Rise Patience, MD ED   05/28/2019 0037 05/28/2019 0227 DNR [id number]  Rise Patience, MD ED   04/30/2019 1646 05/03/2019 1940 DNR [id number]  Dustin Flock, MD ED   10/27/2018 1026 10/30/2018 1711 DNR [id number]  Bettey Costa, MD Inpatient   10/26/2018 2000 10/27/2018 1026 Full Code [id number]  Saundra Shelling, MD Inpatient   08/14/2018 0300 08/15/2018 1605 Full Code [id number]  Lance Coon, MD Inpatient   12/15/2014 1933 01/06/2015 1806 Full Code [id number]  Corey Harold Inpatient   12/05/2014 0340 12/15/2014 1933 Full Code [id number]  Juluis Mire, MD Inpatient   Advance Care Planning Activity        Care plan was discussed with RN, MD  Thank you for allowing the Palliative Medicine Team to assist in the care of this patient.   Time In: 12:10 Time Out: 2:45 Total Time 2 hours 35 min Prolonged Time Billed yes      Greater than 50%  of this time was spent counseling and coordinating care related to the above assessment and plan.  Asencion Gowda, NP  Please contact Palliative Medicine Team phone at [phone] for questions and concerns.

## 2019-06-10 NOTE — Progress Notes (Signed)
   06/18/2019 1435  Clinical Encounter Type  Visited With Family;Patient and family together;Health care provider  Visit Type Initial;Patient actively dying  Referral From Palliative care team  Spiritual Encounters  Spiritual Needs Emotional;Grief support  Stress Factors  Patient Stress Factors Exhausted;Major life changes  Family Stress Factors Loss   This chaplain received a page to see the patient who was actively dying. Upon arrival, the patient's daughter met me outside of the room. She then returned to the bedside and she was understandably emotional. The patient was awake and alert and reported that she was having difficulty breathing. This chaplain assisted with brining family back to the room. Upon return, this chaplain was made aware that the patient had passed away.   The patient's family arrived to support one another. This chaplain and the Palliative Care NP provided support to the family in the wake of the patient's death in the form of active and reflective listening, compassionate ministerial presence, comfort, grief support, facilitation of life review, and prayer. The patient's family talked about the patient's tenacious and yet sweet spirit, independence, and love for her family. The family reported that the patient was able to do much on her own terms, and the Palliative Care NP affirmed that the patient's death was also an exemplification of this. This seemed to give the family some peace in the midst of their grieving. The family expressed appreciation for the visit and spiritual support offered. This chaplain left the family to spend time together as they await next steps.

## 2019-06-10 NOTE — Progress Notes (Signed)
NP  Gave verbal emergent order for morphine 2mg 

## 2019-06-10 NOTE — Progress Notes (Signed)
[facility] called at this time to pick body up in pts room per family

## 2019-06-10 NOTE — Progress Notes (Signed)
pts mews has been from  Red to yellow to red. pts hr elevated. md texted and informed earlier. metopolol ordered and given will cont to  moniter  [place].

## 2019-06-10 NOTE — Progress Notes (Signed)
[facility] here to pick pt up.release form  Filled out and given to ac [email or web address]

## 2019-06-10 DEATH — deceased

## 2019-10-14 ENCOUNTER — Ambulatory Visit (INDEPENDENT_AMBULATORY_CARE_PROVIDER_SITE_OTHER): Payer: Medicare Other | Admitting: Vascular Surgery

## 2020-11-10 IMAGING — RF DG UGI W SINGLE CM
10 series · 11 of 11 positions shown · non-contrast
Comparison: CT 04/30/2019, 07/18/2018.

CLINICAL DATA: Metastatic carcinoid tumor.  Dysphagia.

EXAM:
UPPER GI SERIES WITHOUT KUB
TECHNIQUE: Routine upper GI series was performed with thin/high density/water
soluble barium.
FLUOROSCOPY TIME:  Fluoroscopy Time:  2 minutes 30 seconds
Radiation Exposure Index (if provided by the fluoroscopic device):
29.9 mGy

[Series 1: fluoro_barium 2fps_bw · 0.19mm/px · 1 of 1 slices shown (1 of 10)]
[im 1/1]
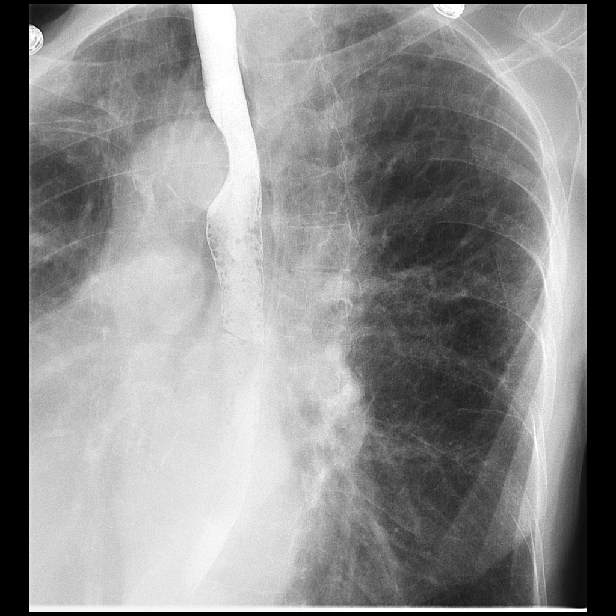

[Series 2: fluoro_barium 2fps_bw · 0.19mm/px · 1 of 1 slices shown (2 of 10)]
[im 1/1]
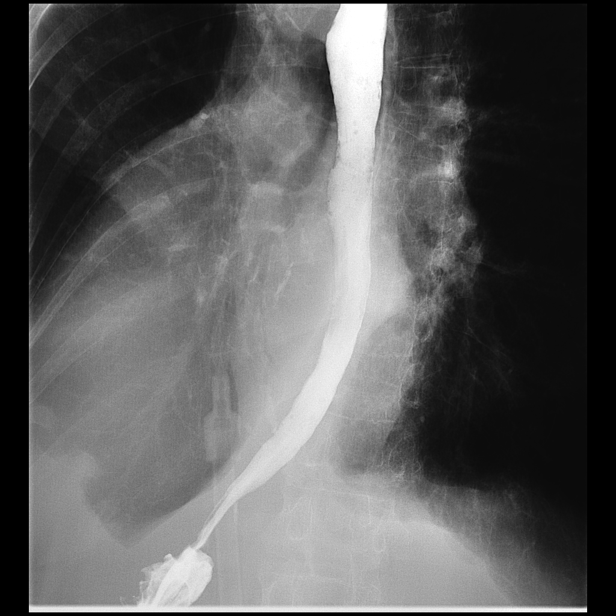

[Series 3: fluoro_barium 2fps_bw · 0.19mm/px · 1 of 1 slices shown (3 of 10)]
[im 1/1]
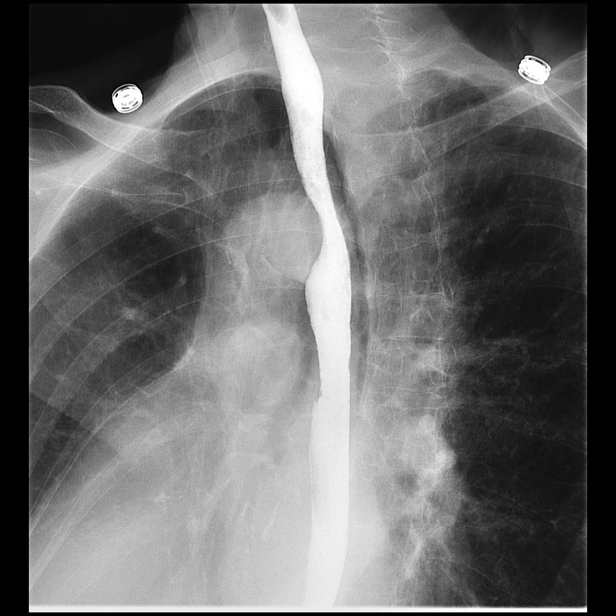

[Series 4: fluoro_barium 2fps_bw · 0.19mm/px · 1 of 1 slices shown (4 of 10)]
[im 1/1]
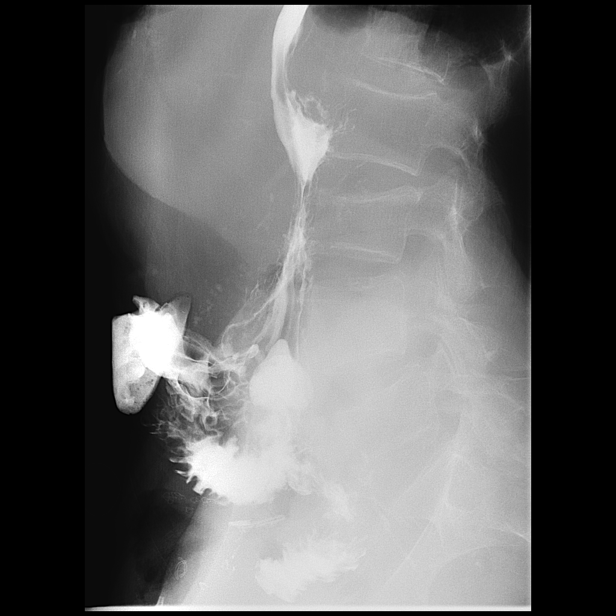

[Series 5: fluoro_barium 2fps_bw · 0.19mm/px · 1 of 1 slices shown (5 of 10)]
[im 1/1]
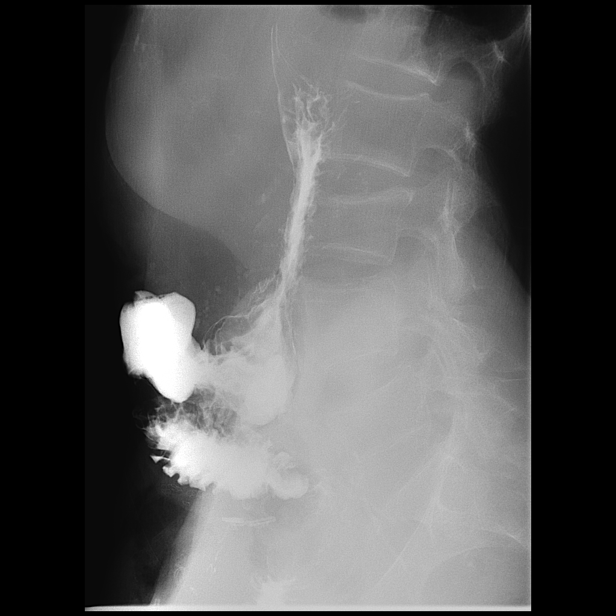

[Series 6: fluoro_barium 2fps_bw · 0.19mm/px · 1 of 1 slices shown (6 of 10)]
[im 1/1]
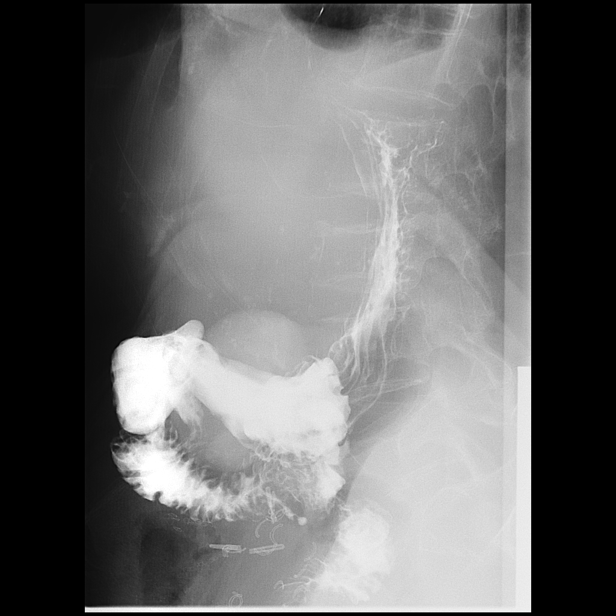

[Series 7: fluoro_barium 2fps_bw · 0.19mm/px · 1 of 1 slices shown (7 of 10)]
[im 1/1]
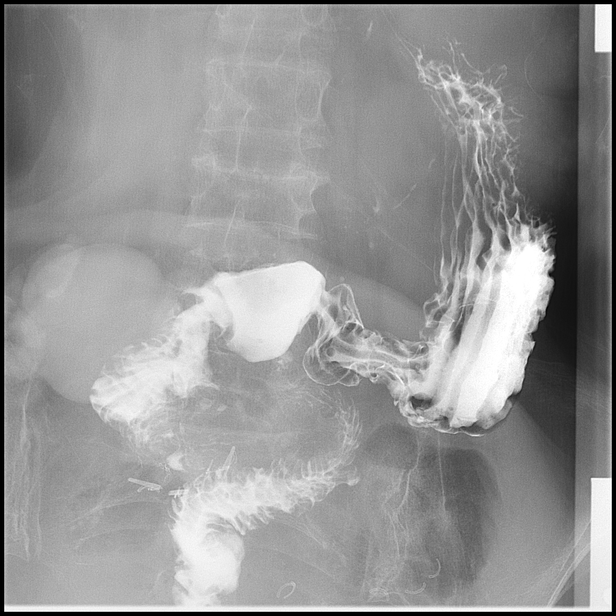

[Series 8: fluoro_barium 2fps_bw · 0.19mm/px · 1 of 1 slices shown (8 of 10)]
[im 1/1]
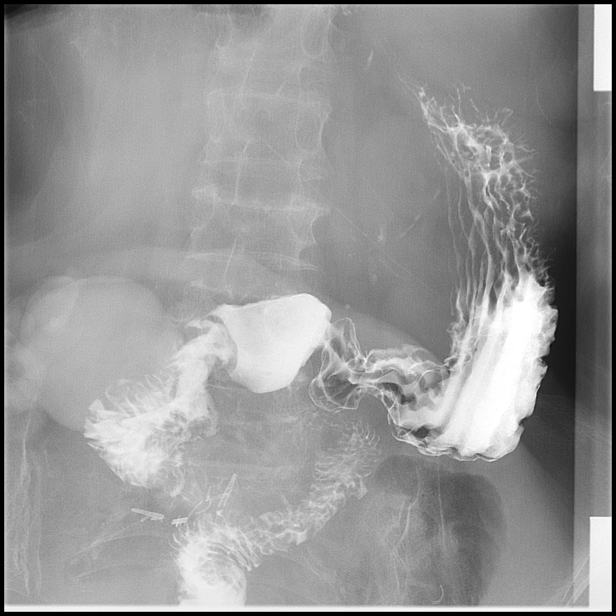

[Series 9: fluoro_barium 2fps_bw · 0.19mm/px · 1 of 1 slices shown (9 of 10)]
[im 1/1]
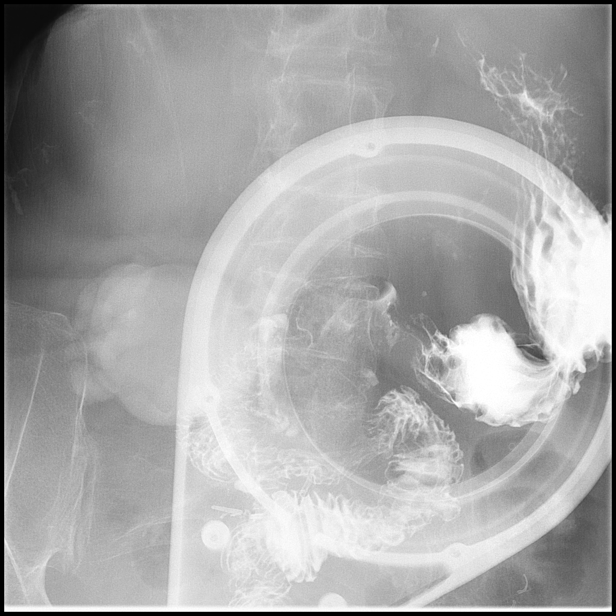

[Series 10: fluoro_barium 2fps_bw · 0.20mm/px · 2 of 2 frames shown (10 of 10)]
[frame 1/2]
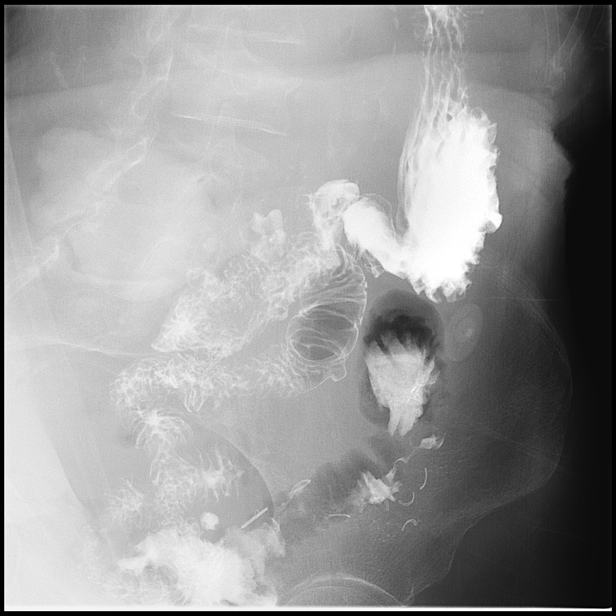
[frame 2/2]
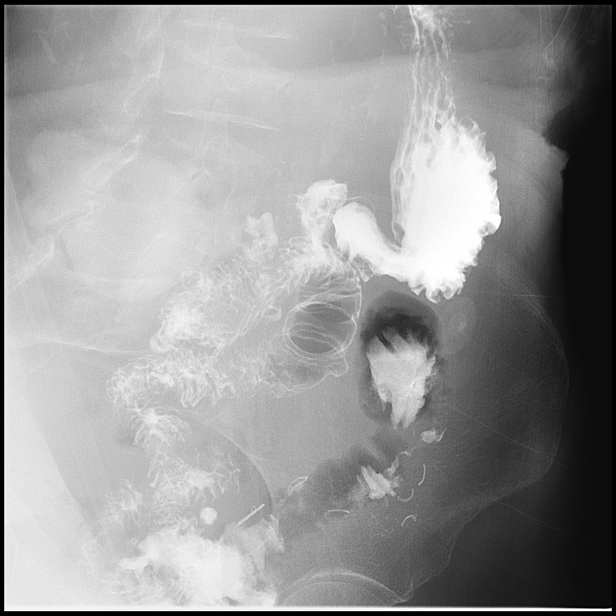

[11 of 11 positions shown; findings below may reference images not displayed]

FINDINGS: Limited exam due to patient's condition. Esophagus is widely patent.
No reflux. Stomach is widely patent without focal abnormality.
Duodenum and C-loop appear normal. IV contrast from prior CT of
04/30/2019 noted in a hydronephrotic right kidney. Stable mild L3
compression fracture. Surgical clips in abdomen.
IMPRESSION: 1. Normal upper GI. No obstructing abnormality identified. No
reflux.

2. IV contrast from prior CT of 04/30/2019 noted in a hydronephrotic
right kidney.
